# Patient Record
Sex: Female | Born: 1965 | Race: White | Hispanic: No | Marital: Married | Smoking: Never smoker
Health system: Southern US, Community
[De-identification: ages and names within clinical notes are randomized; demographics above are authoritative.]

## PROBLEM LIST (undated history)

## (undated) DIAGNOSIS — J329 Chronic sinusitis, unspecified: Secondary | ICD-10-CM

## (undated) DIAGNOSIS — R011 Cardiac murmur, unspecified: Secondary | ICD-10-CM

## (undated) DIAGNOSIS — I890 Lymphedema, not elsewhere classified: Secondary | ICD-10-CM

## (undated) DIAGNOSIS — I00 Rheumatic fever without heart involvement: Secondary | ICD-10-CM

## (undated) DIAGNOSIS — J309 Allergic rhinitis, unspecified: Secondary | ICD-10-CM

## (undated) DIAGNOSIS — G43919 Migraine, unspecified, intractable, without status migrainosus: Secondary | ICD-10-CM

## (undated) DIAGNOSIS — E7800 Pure hypercholesterolemia, unspecified: Secondary | ICD-10-CM

## (undated) DIAGNOSIS — M1710 Unilateral primary osteoarthritis, unspecified knee: Secondary | ICD-10-CM

## (undated) DIAGNOSIS — E669 Obesity, unspecified: Secondary | ICD-10-CM

## (undated) DIAGNOSIS — S83206A Unspecified tear of unspecified meniscus, current injury, right knee, initial encounter: Secondary | ICD-10-CM

## (undated) DIAGNOSIS — J45909 Unspecified asthma, uncomplicated: Principal | ICD-10-CM

## (undated) DIAGNOSIS — K219 Gastro-esophageal reflux disease without esophagitis: Secondary | ICD-10-CM

## (undated) DIAGNOSIS — G5600 Carpal tunnel syndrome, unspecified upper limb: Secondary | ICD-10-CM

## (undated) DIAGNOSIS — F329 Major depressive disorder, single episode, unspecified: Secondary | ICD-10-CM

## (undated) DIAGNOSIS — G971 Other reaction to spinal and lumbar puncture: Secondary | ICD-10-CM

## (undated) DIAGNOSIS — I10 Essential (primary) hypertension: Secondary | ICD-10-CM

## (undated) HISTORY — DX: Chronic sinusitis, unspecified: J32.9

## (undated) HISTORY — DX: Carpal tunnel syndrome, unspecified upper limb: G56.00

## (undated) HISTORY — PX: CHOLECYSTECTOMY: SHX55

## (undated) HISTORY — DX: Major depressive disorder, single episode, unspecified: F32.9

## (undated) HISTORY — PX: CARDIAC CATHETERIZATION: SHX172

## (undated) HISTORY — DX: Essential (primary) hypertension: I10

## (undated) HISTORY — DX: Pure hypercholesterolemia, unspecified: E78.00

## (undated) HISTORY — DX: Unspecified asthma, uncomplicated: J45.909

## (undated) HISTORY — DX: Obesity, unspecified: E66.9

## (undated) HISTORY — DX: Gastro-esophageal reflux disease without esophagitis: K21.9

## (undated) HISTORY — PX: WISDOM TOOTH EXTRACTION: SHX21

## (undated) HISTORY — PX: CERVICAL FUSION: SHX112

## (undated) HISTORY — DX: Unilateral primary osteoarthritis, unspecified knee: M17.10

## (undated) HISTORY — DX: Morbid (severe) obesity due to excess calories: E66.01

## (undated) HISTORY — DX: Lymphedema, not elsewhere classified: I89.0

## (undated) HISTORY — PX: APPENDECTOMY: SHX54

## (undated) HISTORY — DX: Rheumatic fever without heart involvement: I00

## (undated) HISTORY — DX: Migraine, unspecified, intractable, without status migrainosus: G43.919

---

## 1997-06-15 ENCOUNTER — Encounter: Admission: RE | Admit: 1997-06-15 | Discharge: 1997-06-15 | Payer: Self-pay | Admitting: Sports Medicine

## 1997-08-19 ENCOUNTER — Encounter: Admission: RE | Admit: 1997-08-19 | Discharge: 1997-08-19 | Payer: Self-pay | Admitting: Family Medicine

## 1997-09-05 ENCOUNTER — Encounter: Admission: RE | Admit: 1997-09-05 | Discharge: 1997-09-05 | Payer: Self-pay | Admitting: Family Medicine

## 1997-09-06 ENCOUNTER — Encounter: Admission: RE | Admit: 1997-09-06 | Discharge: 1997-09-06 | Payer: Self-pay | Admitting: Family Medicine

## 1997-09-08 ENCOUNTER — Ambulatory Visit (HOSPITAL_COMMUNITY): Admission: RE | Admit: 1997-09-08 | Discharge: 1997-09-08 | Payer: Self-pay | Admitting: Cardiology

## 1997-09-13 ENCOUNTER — Encounter: Admission: RE | Admit: 1997-09-13 | Discharge: 1997-09-13 | Payer: Self-pay | Admitting: Family Medicine

## 1997-10-05 ENCOUNTER — Encounter: Admission: RE | Admit: 1997-10-05 | Discharge: 1997-10-05 | Payer: Self-pay | Admitting: Family Medicine

## 1997-10-25 ENCOUNTER — Encounter: Admission: RE | Admit: 1997-10-25 | Discharge: 1997-10-25 | Payer: Self-pay | Admitting: Sports Medicine

## 1997-11-09 ENCOUNTER — Encounter: Admission: RE | Admit: 1997-11-09 | Discharge: 1997-11-09 | Payer: Self-pay | Admitting: Family Medicine

## 1997-12-13 ENCOUNTER — Emergency Department (HOSPITAL_COMMUNITY): Admission: EM | Admit: 1997-12-13 | Discharge: 1997-12-13 | Payer: Self-pay | Admitting: Emergency Medicine

## 1997-12-16 ENCOUNTER — Encounter: Admission: RE | Admit: 1997-12-16 | Discharge: 1997-12-16 | Payer: Self-pay | Admitting: Family Medicine

## 1998-01-18 ENCOUNTER — Encounter: Admission: RE | Admit: 1998-01-18 | Discharge: 1998-01-18 | Payer: Self-pay | Admitting: Family Medicine

## 1998-02-22 ENCOUNTER — Encounter: Admission: RE | Admit: 1998-02-22 | Discharge: 1998-02-22 | Payer: Self-pay | Admitting: Family Medicine

## 1998-03-16 ENCOUNTER — Ambulatory Visit (HOSPITAL_BASED_OUTPATIENT_CLINIC_OR_DEPARTMENT_OTHER): Admission: RE | Admit: 1998-03-16 | Discharge: 1998-03-16 | Payer: Self-pay | Admitting: Orthopaedic Surgery

## 1998-05-10 ENCOUNTER — Encounter: Admission: RE | Admit: 1998-05-10 | Discharge: 1998-05-10 | Payer: Self-pay | Admitting: Family Medicine

## 1998-07-05 ENCOUNTER — Encounter: Admission: RE | Admit: 1998-07-05 | Discharge: 1998-07-05 | Payer: Self-pay | Admitting: Family Medicine

## 1998-08-17 ENCOUNTER — Encounter: Admission: RE | Admit: 1998-08-17 | Discharge: 1998-08-17 | Payer: Self-pay | Admitting: Family Medicine

## 1999-01-26 ENCOUNTER — Encounter: Admission: RE | Admit: 1999-01-26 | Discharge: 1999-01-26 | Payer: Self-pay | Admitting: Family Medicine

## 1999-03-16 ENCOUNTER — Encounter: Admission: RE | Admit: 1999-03-16 | Discharge: 1999-03-16 | Payer: Self-pay | Admitting: Family Medicine

## 1999-04-09 ENCOUNTER — Encounter: Admission: RE | Admit: 1999-04-09 | Discharge: 1999-04-09 | Payer: Self-pay | Admitting: Family Medicine

## 1999-10-22 ENCOUNTER — Encounter: Admission: RE | Admit: 1999-10-22 | Discharge: 1999-10-22 | Payer: Self-pay | Admitting: Family Medicine

## 1999-12-07 ENCOUNTER — Encounter: Admission: RE | Admit: 1999-12-07 | Discharge: 1999-12-07 | Payer: Self-pay | Admitting: Family Medicine

## 2000-01-25 ENCOUNTER — Emergency Department (HOSPITAL_COMMUNITY): Admission: EM | Admit: 2000-01-25 | Discharge: 2000-01-26 | Payer: Self-pay | Admitting: Emergency Medicine

## 2000-01-26 ENCOUNTER — Encounter: Payer: Self-pay | Admitting: *Deleted

## 2000-02-05 ENCOUNTER — Encounter: Admission: RE | Admit: 2000-02-05 | Discharge: 2000-02-05 | Payer: Self-pay | Admitting: Family Medicine

## 2000-03-17 ENCOUNTER — Encounter: Admission: RE | Admit: 2000-03-17 | Discharge: 2000-03-17 | Payer: Self-pay | Admitting: Family Medicine

## 2000-04-07 ENCOUNTER — Emergency Department (HOSPITAL_COMMUNITY): Admission: EM | Admit: 2000-04-07 | Discharge: 2000-04-07 | Payer: Self-pay | Admitting: Emergency Medicine

## 2000-04-07 ENCOUNTER — Encounter: Payer: Self-pay | Admitting: Emergency Medicine

## 2000-04-14 ENCOUNTER — Encounter: Admission: RE | Admit: 2000-04-14 | Discharge: 2000-04-14 | Payer: Self-pay | Admitting: Family Medicine

## 2000-04-23 ENCOUNTER — Ambulatory Visit (HOSPITAL_COMMUNITY): Admission: RE | Admit: 2000-04-23 | Discharge: 2000-04-23 | Payer: Self-pay | Admitting: Family Medicine

## 2000-04-23 ENCOUNTER — Encounter: Admission: RE | Admit: 2000-04-23 | Discharge: 2000-04-23 | Payer: Self-pay | Admitting: Family Medicine

## 2000-04-24 ENCOUNTER — Ambulatory Visit (HOSPITAL_COMMUNITY): Admission: RE | Admit: 2000-04-24 | Discharge: 2000-04-24 | Payer: Self-pay | Admitting: *Deleted

## 2000-04-25 ENCOUNTER — Encounter: Payer: Self-pay | Admitting: *Deleted

## 2000-04-25 ENCOUNTER — Encounter: Admission: RE | Admit: 2000-04-25 | Discharge: 2000-04-25 | Payer: Self-pay | Admitting: *Deleted

## 2000-05-19 ENCOUNTER — Encounter: Admission: RE | Admit: 2000-05-19 | Discharge: 2000-05-19 | Payer: Self-pay | Admitting: Family Medicine

## 2000-06-02 ENCOUNTER — Encounter: Admission: RE | Admit: 2000-06-02 | Discharge: 2000-06-02 | Payer: Self-pay | Admitting: Family Medicine

## 2000-06-23 ENCOUNTER — Encounter: Admission: RE | Admit: 2000-06-23 | Discharge: 2000-06-23 | Payer: Self-pay | Admitting: Family Medicine

## 2000-07-15 ENCOUNTER — Encounter: Admission: RE | Admit: 2000-07-15 | Discharge: 2000-07-15 | Payer: Self-pay | Admitting: Family Medicine

## 2000-08-06 ENCOUNTER — Encounter: Admission: RE | Admit: 2000-08-06 | Discharge: 2000-08-06 | Payer: Self-pay | Admitting: Family Medicine

## 2000-10-15 ENCOUNTER — Encounter: Admission: RE | Admit: 2000-10-15 | Discharge: 2000-10-15 | Payer: Self-pay | Admitting: Family Medicine

## 2000-11-13 ENCOUNTER — Encounter: Admission: RE | Admit: 2000-11-13 | Discharge: 2000-11-13 | Payer: Self-pay | Admitting: Family Medicine

## 2000-12-21 ENCOUNTER — Emergency Department (HOSPITAL_COMMUNITY): Admission: EM | Admit: 2000-12-21 | Discharge: 2000-12-21 | Payer: Self-pay | Admitting: Emergency Medicine

## 2000-12-24 ENCOUNTER — Encounter: Admission: RE | Admit: 2000-12-24 | Discharge: 2000-12-24 | Payer: Self-pay | Admitting: Family Medicine

## 2001-02-25 ENCOUNTER — Encounter: Admission: RE | Admit: 2001-02-25 | Discharge: 2001-02-25 | Payer: Self-pay | Admitting: Family Medicine

## 2001-03-16 ENCOUNTER — Encounter: Admission: RE | Admit: 2001-03-16 | Discharge: 2001-03-16 | Payer: Self-pay | Admitting: Family Medicine

## 2001-05-14 ENCOUNTER — Encounter: Admission: RE | Admit: 2001-05-14 | Discharge: 2001-05-14 | Payer: Self-pay | Admitting: Sports Medicine

## 2001-05-21 ENCOUNTER — Encounter: Admission: RE | Admit: 2001-05-21 | Discharge: 2001-05-21 | Payer: Self-pay | Admitting: Family Medicine

## 2001-07-20 ENCOUNTER — Encounter: Admission: RE | Admit: 2001-07-20 | Discharge: 2001-07-20 | Payer: Self-pay | Admitting: Family Medicine

## 2001-10-02 ENCOUNTER — Encounter: Admission: RE | Admit: 2001-10-02 | Discharge: 2001-10-02 | Payer: Self-pay | Admitting: Family Medicine

## 2001-10-06 ENCOUNTER — Encounter: Admission: RE | Admit: 2001-10-06 | Discharge: 2001-10-06 | Payer: Self-pay | Admitting: Family Medicine

## 2001-10-08 ENCOUNTER — Encounter (INDEPENDENT_AMBULATORY_CARE_PROVIDER_SITE_OTHER): Payer: Self-pay | Admitting: *Deleted

## 2001-10-08 ENCOUNTER — Ambulatory Visit (HOSPITAL_BASED_OUTPATIENT_CLINIC_OR_DEPARTMENT_OTHER): Admission: RE | Admit: 2001-10-08 | Discharge: 2001-10-08 | Payer: Self-pay | Admitting: General Surgery

## 2001-10-08 ENCOUNTER — Encounter: Payer: Self-pay | Admitting: General Surgery

## 2001-10-08 ENCOUNTER — Encounter: Admission: RE | Admit: 2001-10-08 | Discharge: 2001-10-08 | Payer: Self-pay | Admitting: General Surgery

## 2001-10-09 ENCOUNTER — Observation Stay (HOSPITAL_COMMUNITY): Admission: EM | Admit: 2001-10-09 | Discharge: 2001-10-09 | Payer: Self-pay | Admitting: General Surgery

## 2001-12-23 ENCOUNTER — Encounter: Admission: RE | Admit: 2001-12-23 | Discharge: 2001-12-23 | Payer: Self-pay | Admitting: Family Medicine

## 2002-01-01 ENCOUNTER — Encounter: Admission: RE | Admit: 2002-01-01 | Discharge: 2002-01-01 | Payer: Self-pay | Admitting: Family Medicine

## 2002-01-22 ENCOUNTER — Encounter: Admission: RE | Admit: 2002-01-22 | Discharge: 2002-01-22 | Payer: Self-pay | Admitting: Family Medicine

## 2002-06-01 ENCOUNTER — Encounter: Admission: RE | Admit: 2002-06-01 | Discharge: 2002-06-01 | Payer: Self-pay | Admitting: Family Medicine

## 2002-08-10 ENCOUNTER — Encounter: Admission: RE | Admit: 2002-08-10 | Discharge: 2002-08-10 | Payer: Self-pay | Admitting: Family Medicine

## 2002-09-02 ENCOUNTER — Encounter: Admission: RE | Admit: 2002-09-02 | Discharge: 2002-09-02 | Payer: Self-pay | Admitting: Family Medicine

## 2002-10-06 ENCOUNTER — Encounter: Admission: RE | Admit: 2002-10-06 | Discharge: 2002-10-06 | Payer: Self-pay | Admitting: Family Medicine

## 2003-03-16 ENCOUNTER — Encounter: Admission: RE | Admit: 2003-03-16 | Discharge: 2003-03-16 | Payer: Self-pay | Admitting: Family Medicine

## 2003-03-24 ENCOUNTER — Encounter: Admission: RE | Admit: 2003-03-24 | Discharge: 2003-03-24 | Payer: Self-pay | Admitting: Family Medicine

## 2003-08-31 ENCOUNTER — Encounter: Admission: RE | Admit: 2003-08-31 | Discharge: 2003-08-31 | Payer: Self-pay | Admitting: Family Medicine

## 2003-10-11 ENCOUNTER — Emergency Department (HOSPITAL_COMMUNITY): Admission: EM | Admit: 2003-10-11 | Discharge: 2003-10-11 | Payer: Self-pay | Admitting: Emergency Medicine

## 2003-12-05 ENCOUNTER — Emergency Department (HOSPITAL_COMMUNITY): Admission: EM | Admit: 2003-12-05 | Discharge: 2003-12-05 | Payer: Self-pay | Admitting: Emergency Medicine

## 2004-03-30 ENCOUNTER — Ambulatory Visit: Payer: Self-pay | Admitting: Sports Medicine

## 2004-06-07 ENCOUNTER — Ambulatory Visit: Payer: Self-pay | Admitting: Family Medicine

## 2005-04-17 ENCOUNTER — Ambulatory Visit: Payer: Self-pay | Admitting: Family Medicine

## 2005-05-07 ENCOUNTER — Ambulatory Visit: Payer: Self-pay | Admitting: Sports Medicine

## 2005-05-07 ENCOUNTER — Encounter: Admission: RE | Admit: 2005-05-07 | Discharge: 2005-05-07 | Payer: Self-pay | Admitting: Family Medicine

## 2005-05-10 ENCOUNTER — Encounter: Admission: RE | Admit: 2005-05-10 | Discharge: 2005-05-10 | Payer: Self-pay | Admitting: Family Medicine

## 2005-05-27 ENCOUNTER — Ambulatory Visit: Payer: Self-pay | Admitting: Family Medicine

## 2005-06-15 ENCOUNTER — Encounter (INDEPENDENT_AMBULATORY_CARE_PROVIDER_SITE_OTHER): Payer: Self-pay | Admitting: *Deleted

## 2005-08-27 ENCOUNTER — Ambulatory Visit (HOSPITAL_BASED_OUTPATIENT_CLINIC_OR_DEPARTMENT_OTHER): Admission: RE | Admit: 2005-08-27 | Discharge: 2005-08-27 | Payer: Self-pay | Admitting: Orthopaedic Surgery

## 2006-01-10 ENCOUNTER — Ambulatory Visit: Payer: Self-pay | Admitting: Family Medicine

## 2006-01-22 ENCOUNTER — Ambulatory Visit: Payer: Self-pay | Admitting: Family Medicine

## 2006-02-21 ENCOUNTER — Ambulatory Visit: Payer: Self-pay | Admitting: Sports Medicine

## 2006-03-25 ENCOUNTER — Ambulatory Visit: Payer: Self-pay | Admitting: Family Medicine

## 2006-05-01 ENCOUNTER — Ambulatory Visit: Payer: Self-pay | Admitting: Family Medicine

## 2006-05-08 DIAGNOSIS — K219 Gastro-esophageal reflux disease without esophagitis: Secondary | ICD-10-CM

## 2006-05-08 DIAGNOSIS — J45909 Unspecified asthma, uncomplicated: Secondary | ICD-10-CM

## 2006-05-08 DIAGNOSIS — J329 Chronic sinusitis, unspecified: Secondary | ICD-10-CM

## 2006-05-08 DIAGNOSIS — J309 Allergic rhinitis, unspecified: Secondary | ICD-10-CM | POA: Insufficient documentation

## 2006-05-08 DIAGNOSIS — E669 Obesity, unspecified: Secondary | ICD-10-CM

## 2006-05-08 DIAGNOSIS — E7800 Pure hypercholesterolemia, unspecified: Secondary | ICD-10-CM

## 2006-05-08 DIAGNOSIS — G5600 Carpal tunnel syndrome, unspecified upper limb: Secondary | ICD-10-CM

## 2006-05-08 HISTORY — DX: Chronic sinusitis, unspecified: J32.9

## 2006-05-08 HISTORY — DX: Pure hypercholesterolemia, unspecified: E78.00

## 2006-05-08 HISTORY — DX: Carpal tunnel syndrome, unspecified upper limb: G56.00

## 2006-05-08 HISTORY — DX: Gastro-esophageal reflux disease without esophagitis: K21.9

## 2006-05-09 ENCOUNTER — Encounter (INDEPENDENT_AMBULATORY_CARE_PROVIDER_SITE_OTHER): Payer: Self-pay | Admitting: *Deleted

## 2006-05-21 ENCOUNTER — Telehealth: Payer: Self-pay | Admitting: *Deleted

## 2006-05-22 ENCOUNTER — Ambulatory Visit: Payer: Self-pay | Admitting: Sports Medicine

## 2006-05-22 DIAGNOSIS — I10 Essential (primary) hypertension: Secondary | ICD-10-CM

## 2006-05-22 HISTORY — DX: Essential (primary) hypertension: I10

## 2006-05-27 ENCOUNTER — Telehealth (INDEPENDENT_AMBULATORY_CARE_PROVIDER_SITE_OTHER): Payer: Self-pay | Admitting: *Deleted

## 2006-05-30 ENCOUNTER — Telehealth: Payer: Self-pay | Admitting: *Deleted

## 2006-06-27 ENCOUNTER — Telehealth (INDEPENDENT_AMBULATORY_CARE_PROVIDER_SITE_OTHER): Payer: Self-pay | Admitting: *Deleted

## 2006-07-11 ENCOUNTER — Ambulatory Visit: Payer: Self-pay | Admitting: Family Medicine

## 2006-09-23 ENCOUNTER — Telehealth: Payer: Self-pay | Admitting: *Deleted

## 2006-09-29 ENCOUNTER — Ambulatory Visit: Payer: Self-pay | Admitting: Family Medicine

## 2006-10-24 ENCOUNTER — Encounter: Admission: RE | Admit: 2006-10-24 | Discharge: 2006-10-24 | Payer: Self-pay | Admitting: Sports Medicine

## 2006-10-24 ENCOUNTER — Encounter (INDEPENDENT_AMBULATORY_CARE_PROVIDER_SITE_OTHER): Payer: Self-pay | Admitting: *Deleted

## 2006-10-24 ENCOUNTER — Ambulatory Visit: Payer: Self-pay | Admitting: Family Medicine

## 2006-10-24 LAB — CONVERTED CEMR LAB: Uric Acid, Serum: 5.5 mg/dL (ref 2.4–7.0)

## 2006-10-31 ENCOUNTER — Ambulatory Visit: Payer: Self-pay | Admitting: Family Medicine

## 2006-11-06 ENCOUNTER — Telehealth: Payer: Self-pay | Admitting: *Deleted

## 2006-11-12 ENCOUNTER — Ambulatory Visit: Payer: Self-pay | Admitting: Family Medicine

## 2006-11-12 ENCOUNTER — Telehealth (INDEPENDENT_AMBULATORY_CARE_PROVIDER_SITE_OTHER): Payer: Self-pay | Admitting: Family Medicine

## 2006-11-17 ENCOUNTER — Encounter (INDEPENDENT_AMBULATORY_CARE_PROVIDER_SITE_OTHER): Payer: Self-pay | Admitting: *Deleted

## 2006-11-17 ENCOUNTER — Ambulatory Visit: Payer: Self-pay | Admitting: Sports Medicine

## 2006-11-18 ENCOUNTER — Encounter (INDEPENDENT_AMBULATORY_CARE_PROVIDER_SITE_OTHER): Payer: Self-pay | Admitting: *Deleted

## 2006-11-18 LAB — CONVERTED CEMR LAB
Albumin: 3.9 g/dL (ref 3.5–5.2)
BUN: 12 mg/dL (ref 6–23)
CO2: 22 meq/L (ref 19–32)
Calcium: 9.2 mg/dL (ref 8.4–10.5)
Glucose, Bld: 96 mg/dL (ref 70–99)
Potassium: 4.2 meq/L (ref 3.5–5.3)
Sodium: 138 meq/L (ref 135–145)
Total Protein: 7.2 g/dL (ref 6.0–8.3)

## 2006-11-19 ENCOUNTER — Ambulatory Visit: Payer: Self-pay | Admitting: Family Medicine

## 2006-11-19 ENCOUNTER — Telehealth: Payer: Self-pay | Admitting: *Deleted

## 2006-11-19 DIAGNOSIS — G43909 Migraine, unspecified, not intractable, without status migrainosus: Secondary | ICD-10-CM

## 2006-11-24 ENCOUNTER — Ambulatory Visit: Payer: Self-pay | Admitting: Family Medicine

## 2006-11-25 ENCOUNTER — Emergency Department (HOSPITAL_COMMUNITY): Admission: EM | Admit: 2006-11-25 | Discharge: 2006-11-25 | Payer: Self-pay | Admitting: Emergency Medicine

## 2006-11-25 ENCOUNTER — Ambulatory Visit: Payer: Self-pay | Admitting: Family Medicine

## 2006-11-25 ENCOUNTER — Ambulatory Visit (HOSPITAL_COMMUNITY): Admission: RE | Admit: 2006-11-25 | Discharge: 2006-11-25 | Payer: Self-pay | Admitting: Family Medicine

## 2006-11-27 ENCOUNTER — Encounter (INDEPENDENT_AMBULATORY_CARE_PROVIDER_SITE_OTHER): Payer: Self-pay | Admitting: *Deleted

## 2006-12-08 ENCOUNTER — Encounter: Payer: Self-pay | Admitting: Family Medicine

## 2006-12-08 ENCOUNTER — Ambulatory Visit: Payer: Self-pay | Admitting: Cardiology

## 2006-12-08 ENCOUNTER — Ambulatory Visit: Payer: Self-pay | Admitting: Sports Medicine

## 2006-12-15 ENCOUNTER — Encounter (INDEPENDENT_AMBULATORY_CARE_PROVIDER_SITE_OTHER): Payer: Self-pay | Admitting: *Deleted

## 2006-12-15 ENCOUNTER — Ambulatory Visit: Payer: Self-pay

## 2006-12-15 LAB — CONVERTED CEMR LAB
BUN: 7 mg/dL
CO2: 29 meq/L
Calcium: 8.5 mg/dL
Chloride: 106 meq/L
Creatinine, Ser: 0.7 mg/dL
GFR calc Af Amer: 118 mL/min
GFR calc non Af Amer: 98 mL/min
Glucose, Bld: 98 mg/dL
Potassium: 3.9 meq/L
Sodium: 141 meq/L

## 2006-12-16 ENCOUNTER — Ambulatory Visit: Payer: Self-pay | Admitting: Family Medicine

## 2006-12-16 ENCOUNTER — Ambulatory Visit: Payer: Self-pay

## 2006-12-16 DIAGNOSIS — F32A Depression, unspecified: Secondary | ICD-10-CM

## 2006-12-16 DIAGNOSIS — F329 Major depressive disorder, single episode, unspecified: Secondary | ICD-10-CM

## 2006-12-16 HISTORY — DX: Depression, unspecified: F32.A

## 2006-12-31 ENCOUNTER — Ambulatory Visit: Payer: Self-pay | Admitting: Cardiology

## 2007-01-06 ENCOUNTER — Ambulatory Visit: Payer: Self-pay | Admitting: Family Medicine

## 2007-01-21 ENCOUNTER — Ambulatory Visit: Payer: Self-pay | Admitting: Family Medicine

## 2007-02-04 ENCOUNTER — Encounter (INDEPENDENT_AMBULATORY_CARE_PROVIDER_SITE_OTHER): Payer: Self-pay | Admitting: *Deleted

## 2007-02-12 ENCOUNTER — Ambulatory Visit: Payer: Self-pay | Admitting: Family Medicine

## 2007-02-12 LAB — CONVERTED CEMR LAB: Rapid Strep: NEGATIVE

## 2007-02-18 ENCOUNTER — Ambulatory Visit: Payer: Self-pay | Admitting: Family Medicine

## 2007-02-24 ENCOUNTER — Telehealth (INDEPENDENT_AMBULATORY_CARE_PROVIDER_SITE_OTHER): Payer: Self-pay | Admitting: *Deleted

## 2007-02-26 ENCOUNTER — Encounter: Payer: Self-pay | Admitting: *Deleted

## 2007-03-23 ENCOUNTER — Ambulatory Visit: Payer: Self-pay | Admitting: Family Medicine

## 2007-03-29 ENCOUNTER — Encounter (INDEPENDENT_AMBULATORY_CARE_PROVIDER_SITE_OTHER): Payer: Self-pay | Admitting: General Surgery

## 2007-03-29 ENCOUNTER — Observation Stay (HOSPITAL_COMMUNITY): Admission: EM | Admit: 2007-03-29 | Discharge: 2007-03-30 | Payer: Self-pay | Admitting: Emergency Medicine

## 2007-04-20 ENCOUNTER — Ambulatory Visit: Payer: Self-pay | Admitting: Family Medicine

## 2007-04-27 ENCOUNTER — Encounter (INDEPENDENT_AMBULATORY_CARE_PROVIDER_SITE_OTHER): Payer: Self-pay | Admitting: *Deleted

## 2007-05-01 ENCOUNTER — Telehealth: Payer: Self-pay | Admitting: *Deleted

## 2007-05-20 ENCOUNTER — Encounter (INDEPENDENT_AMBULATORY_CARE_PROVIDER_SITE_OTHER): Payer: Self-pay | Admitting: Family Medicine

## 2007-05-20 ENCOUNTER — Telehealth (INDEPENDENT_AMBULATORY_CARE_PROVIDER_SITE_OTHER): Payer: Self-pay | Admitting: *Deleted

## 2007-05-20 ENCOUNTER — Encounter (INDEPENDENT_AMBULATORY_CARE_PROVIDER_SITE_OTHER): Payer: Self-pay | Admitting: *Deleted

## 2007-05-22 ENCOUNTER — Ambulatory Visit: Payer: Self-pay | Admitting: Family Medicine

## 2007-05-22 ENCOUNTER — Encounter: Payer: Self-pay | Admitting: *Deleted

## 2007-05-28 ENCOUNTER — Telehealth (INDEPENDENT_AMBULATORY_CARE_PROVIDER_SITE_OTHER): Payer: Self-pay | Admitting: *Deleted

## 2007-06-15 ENCOUNTER — Encounter (INDEPENDENT_AMBULATORY_CARE_PROVIDER_SITE_OTHER): Payer: Self-pay | Admitting: *Deleted

## 2007-06-24 ENCOUNTER — Ambulatory Visit: Payer: Self-pay | Admitting: Family Medicine

## 2007-07-08 ENCOUNTER — Ambulatory Visit: Payer: Self-pay | Admitting: Cardiology

## 2007-07-09 ENCOUNTER — Telehealth (INDEPENDENT_AMBULATORY_CARE_PROVIDER_SITE_OTHER): Payer: Self-pay | Admitting: *Deleted

## 2007-07-09 LAB — CONVERTED CEMR LAB
GFR calc Af Amer: 117 mL/min
Glucose, Bld: 105 mg/dL — ABNORMAL HIGH (ref 70–99)
HCT: 39.4 % (ref 36.0–46.0)
INR: 1.1 — ABNORMAL HIGH (ref 0.8–1.0)
Lymphocytes Relative: 20.2 % (ref 12.0–46.0)
Monocytes Absolute: 0.3 10*3/uL (ref 0.1–1.0)
Monocytes Relative: 3.4 % (ref 3.0–12.0)
Neutrophils Relative %: 74.8 % (ref 43.0–77.0)
Platelets: 332 10*3/uL (ref 150–400)
Potassium: 4.2 meq/L (ref 3.5–5.1)
Prothrombin Time: 13.4 s — ABNORMAL HIGH (ref 10.9–13.3)
RDW: 13.4 % (ref 11.5–14.6)
Sodium: 138 meq/L (ref 135–145)

## 2007-07-10 ENCOUNTER — Inpatient Hospital Stay (HOSPITAL_BASED_OUTPATIENT_CLINIC_OR_DEPARTMENT_OTHER): Admission: RE | Admit: 2007-07-10 | Discharge: 2007-07-10 | Payer: Self-pay | Admitting: Cardiovascular Disease

## 2007-07-10 ENCOUNTER — Observation Stay (HOSPITAL_COMMUNITY): Admission: AD | Admit: 2007-07-10 | Discharge: 2007-07-11 | Payer: Self-pay | Admitting: Cardiovascular Disease

## 2007-07-10 ENCOUNTER — Ambulatory Visit: Payer: Self-pay | Admitting: Cardiovascular Disease

## 2007-07-23 ENCOUNTER — Ambulatory Visit: Payer: Self-pay | Admitting: Family Medicine

## 2007-07-27 ENCOUNTER — Encounter (INDEPENDENT_AMBULATORY_CARE_PROVIDER_SITE_OTHER): Payer: Self-pay | Admitting: *Deleted

## 2007-07-27 ENCOUNTER — Ambulatory Visit: Payer: Self-pay | Admitting: Family Medicine

## 2007-07-27 LAB — CONVERTED CEMR LAB: LDL Cholesterol: 137 mg/dL

## 2007-07-29 LAB — CONVERTED CEMR LAB
AST: 10 units/L (ref 0–37)
Albumin: 3.6 g/dL (ref 3.5–5.2)
Alkaline Phosphatase: 55 units/L (ref 39–117)
Glucose, Bld: 101 mg/dL — ABNORMAL HIGH (ref 70–99)
LDL Cholesterol: 137 mg/dL — ABNORMAL HIGH (ref 0–99)
Potassium: 4.3 meq/L (ref 3.5–5.3)
Sodium: 139 meq/L (ref 135–145)
Total Protein: 6.7 g/dL (ref 6.0–8.3)
Triglycerides: 118 mg/dL (ref <150)
Uric Acid, Serum: 5.6 mg/dL (ref 2.4–7.0)

## 2007-07-30 ENCOUNTER — Ambulatory Visit: Payer: Self-pay | Admitting: Cardiology

## 2007-07-31 ENCOUNTER — Encounter (INDEPENDENT_AMBULATORY_CARE_PROVIDER_SITE_OTHER): Payer: Self-pay | Admitting: *Deleted

## 2007-07-31 ENCOUNTER — Ambulatory Visit: Payer: Self-pay | Admitting: Family Medicine

## 2007-07-31 DIAGNOSIS — L301 Dyshidrosis [pompholyx]: Secondary | ICD-10-CM

## 2007-08-06 ENCOUNTER — Encounter: Admission: RE | Admit: 2007-08-06 | Discharge: 2007-08-06 | Payer: Self-pay | Admitting: Family Medicine

## 2007-08-06 ENCOUNTER — Telehealth: Payer: Self-pay | Admitting: *Deleted

## 2007-08-06 ENCOUNTER — Ambulatory Visit: Payer: Self-pay | Admitting: Family Medicine

## 2007-08-06 DIAGNOSIS — M5412 Radiculopathy, cervical region: Secondary | ICD-10-CM

## 2007-08-10 ENCOUNTER — Ambulatory Visit: Payer: Self-pay | Admitting: Family Medicine

## 2007-08-10 ENCOUNTER — Encounter (INDEPENDENT_AMBULATORY_CARE_PROVIDER_SITE_OTHER): Payer: Self-pay | Admitting: *Deleted

## 2007-08-12 ENCOUNTER — Encounter (INDEPENDENT_AMBULATORY_CARE_PROVIDER_SITE_OTHER): Payer: Self-pay | Admitting: *Deleted

## 2007-08-12 ENCOUNTER — Encounter: Admission: RE | Admit: 2007-08-12 | Discharge: 2007-08-12 | Payer: Self-pay | Admitting: Family Medicine

## 2007-08-17 ENCOUNTER — Ambulatory Visit: Payer: Self-pay | Admitting: Sports Medicine

## 2007-08-18 ENCOUNTER — Telehealth (INDEPENDENT_AMBULATORY_CARE_PROVIDER_SITE_OTHER): Payer: Self-pay | Admitting: *Deleted

## 2007-08-24 ENCOUNTER — Telehealth (INDEPENDENT_AMBULATORY_CARE_PROVIDER_SITE_OTHER): Payer: Self-pay | Admitting: *Deleted

## 2007-09-01 ENCOUNTER — Encounter (INDEPENDENT_AMBULATORY_CARE_PROVIDER_SITE_OTHER): Payer: Self-pay | Admitting: *Deleted

## 2007-09-01 ENCOUNTER — Encounter: Admission: RE | Admit: 2007-09-01 | Discharge: 2007-09-01 | Payer: Self-pay | Admitting: Sports Medicine

## 2007-09-01 ENCOUNTER — Ambulatory Visit: Payer: Self-pay | Admitting: Family Medicine

## 2007-09-08 ENCOUNTER — Telehealth: Payer: Self-pay | Admitting: *Deleted

## 2007-09-29 ENCOUNTER — Ambulatory Visit: Payer: Self-pay | Admitting: Family Medicine

## 2007-09-29 ENCOUNTER — Encounter (INDEPENDENT_AMBULATORY_CARE_PROVIDER_SITE_OTHER): Payer: Self-pay | Admitting: Family Medicine

## 2007-09-29 LAB — CONVERTED CEMR LAB: Whiff Test: NEGATIVE

## 2007-10-06 LAB — CONVERTED CEMR LAB: Pap Smear: NORMAL

## 2007-10-09 ENCOUNTER — Telehealth: Payer: Self-pay | Admitting: *Deleted

## 2007-10-12 ENCOUNTER — Other Ambulatory Visit: Payer: Self-pay | Admitting: Neurosurgery

## 2007-10-12 ENCOUNTER — Encounter (INDEPENDENT_AMBULATORY_CARE_PROVIDER_SITE_OTHER): Payer: Self-pay | Admitting: Family Medicine

## 2007-10-14 ENCOUNTER — Encounter: Payer: Self-pay | Admitting: Neurosurgery

## 2007-10-14 ENCOUNTER — Ambulatory Visit (HOSPITAL_COMMUNITY): Admission: RE | Admit: 2007-10-14 | Discharge: 2007-10-16 | Payer: Self-pay | Admitting: Neurosurgery

## 2007-10-29 ENCOUNTER — Telehealth (INDEPENDENT_AMBULATORY_CARE_PROVIDER_SITE_OTHER): Payer: Self-pay | Admitting: Family Medicine

## 2007-11-19 ENCOUNTER — Telehealth (INDEPENDENT_AMBULATORY_CARE_PROVIDER_SITE_OTHER): Payer: Self-pay | Admitting: *Deleted

## 2007-12-01 ENCOUNTER — Ambulatory Visit: Payer: Self-pay | Admitting: Family Medicine

## 2007-12-11 ENCOUNTER — Telehealth (INDEPENDENT_AMBULATORY_CARE_PROVIDER_SITE_OTHER): Payer: Self-pay | Admitting: Family Medicine

## 2008-01-14 ENCOUNTER — Telehealth (INDEPENDENT_AMBULATORY_CARE_PROVIDER_SITE_OTHER): Payer: Self-pay | Admitting: *Deleted

## 2008-01-26 ENCOUNTER — Encounter (INDEPENDENT_AMBULATORY_CARE_PROVIDER_SITE_OTHER): Payer: Self-pay | Admitting: Family Medicine

## 2008-03-15 ENCOUNTER — Telehealth: Payer: Self-pay | Admitting: *Deleted

## 2008-03-16 ENCOUNTER — Encounter (INDEPENDENT_AMBULATORY_CARE_PROVIDER_SITE_OTHER): Payer: Self-pay | Admitting: *Deleted

## 2008-03-16 ENCOUNTER — Ambulatory Visit: Payer: Self-pay | Admitting: Family Medicine

## 2008-04-22 ENCOUNTER — Encounter (INDEPENDENT_AMBULATORY_CARE_PROVIDER_SITE_OTHER): Payer: Self-pay | Admitting: Family Medicine

## 2008-05-27 ENCOUNTER — Encounter (INDEPENDENT_AMBULATORY_CARE_PROVIDER_SITE_OTHER): Payer: Self-pay | Admitting: Family Medicine

## 2008-07-06 ENCOUNTER — Encounter (INDEPENDENT_AMBULATORY_CARE_PROVIDER_SITE_OTHER): Payer: Self-pay | Admitting: Family Medicine

## 2008-07-06 ENCOUNTER — Ambulatory Visit: Payer: Self-pay | Admitting: Family Medicine

## 2008-07-12 ENCOUNTER — Ambulatory Visit: Payer: Self-pay | Admitting: Family Medicine

## 2008-07-12 ENCOUNTER — Telehealth (INDEPENDENT_AMBULATORY_CARE_PROVIDER_SITE_OTHER): Payer: Self-pay | Admitting: Family Medicine

## 2008-08-15 ENCOUNTER — Telehealth (INDEPENDENT_AMBULATORY_CARE_PROVIDER_SITE_OTHER): Payer: Self-pay | Admitting: Family Medicine

## 2008-09-07 ENCOUNTER — Encounter (INDEPENDENT_AMBULATORY_CARE_PROVIDER_SITE_OTHER): Payer: Self-pay | Admitting: Family Medicine

## 2008-09-09 ENCOUNTER — Telehealth: Payer: Self-pay | Admitting: Family Medicine

## 2008-10-14 ENCOUNTER — Telehealth: Payer: Self-pay | Admitting: Family Medicine

## 2008-11-24 ENCOUNTER — Ambulatory Visit: Payer: Self-pay | Admitting: Family Medicine

## 2008-11-24 ENCOUNTER — Encounter: Payer: Self-pay | Admitting: Family Medicine

## 2008-11-24 LAB — CONVERTED CEMR LAB
Nitrite: NEGATIVE
Urobilinogen, UA: 0.2
Whiff Test: NEGATIVE

## 2008-12-22 ENCOUNTER — Telehealth: Payer: Self-pay | Admitting: Family Medicine

## 2008-12-23 ENCOUNTER — Ambulatory Visit: Payer: Self-pay | Admitting: Family Medicine

## 2008-12-23 LAB — CONVERTED CEMR LAB

## 2009-04-21 ENCOUNTER — Emergency Department (HOSPITAL_COMMUNITY): Admission: EM | Admit: 2009-04-21 | Discharge: 2009-04-21 | Payer: Self-pay | Admitting: Emergency Medicine

## 2009-05-27 IMAGING — CR DG HAND COMPLETE 3+V*R*
3 series · 3 of 3 positions shown · non-contrast
Comparison: None

Exam: Right hand 3 views

HISTORY: Hand pain and swelling

[x hand pa right]
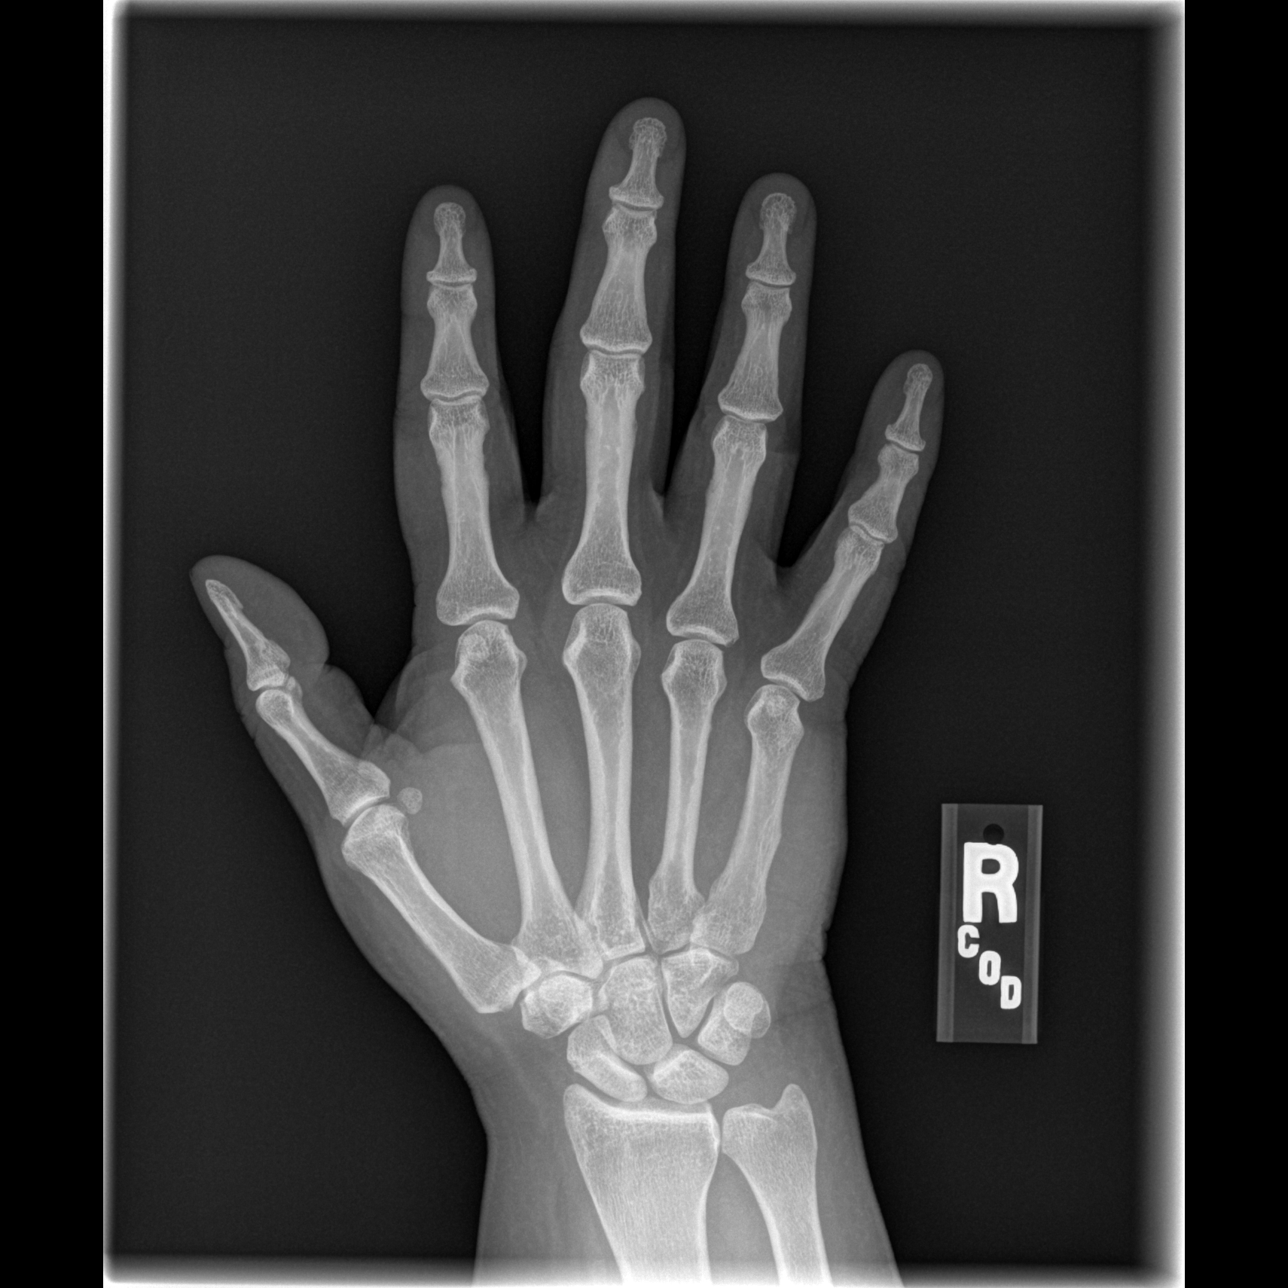

[x hand oblique right]
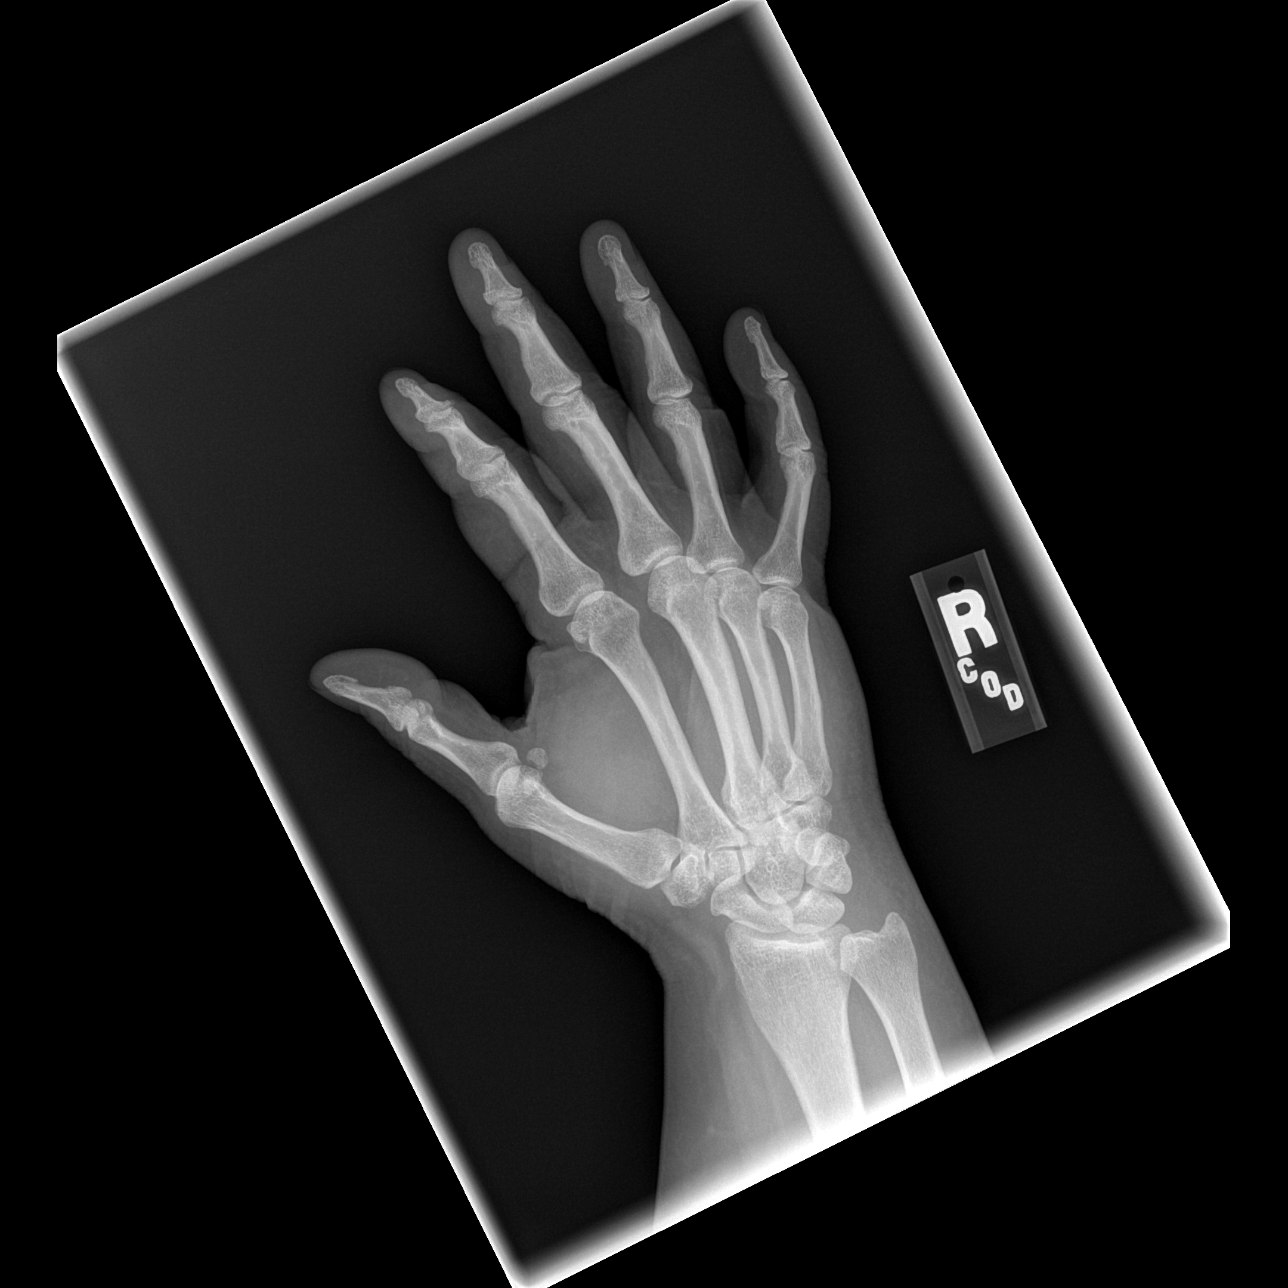

[x hand lat right]
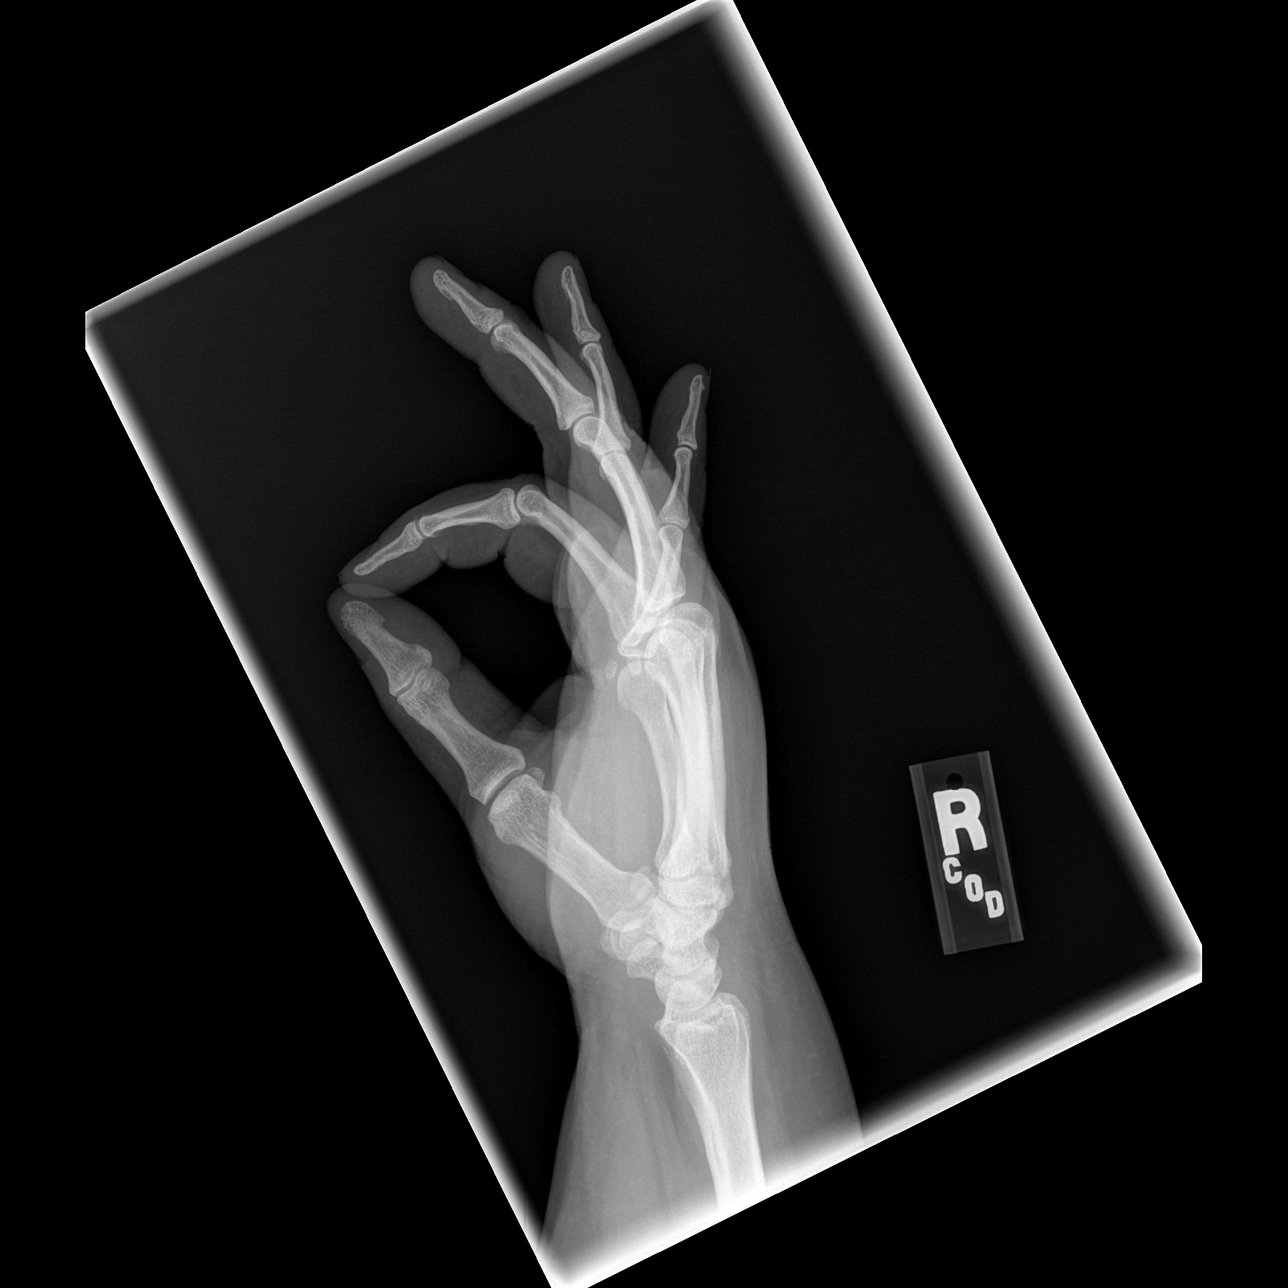

[3 of 3 positions shown; findings below may reference images not displayed]

FINDINGS: There is no acute fractures or dislocations.

Soft tissues are unremarkable.

No pathologic calcifications or radiopaque foreign bodies.
IMPRESSION: 1. No acute fractures or subluxations

## 2009-05-27 IMAGING — CR DG WRIST COMPLETE 3+V*R*
4 series · 4 of 4 positions shown · non-contrast
Comparison: None

Exam: Right wrist 3 views

HISTORY: Right hand pain and swelling

[x wrist pa right]
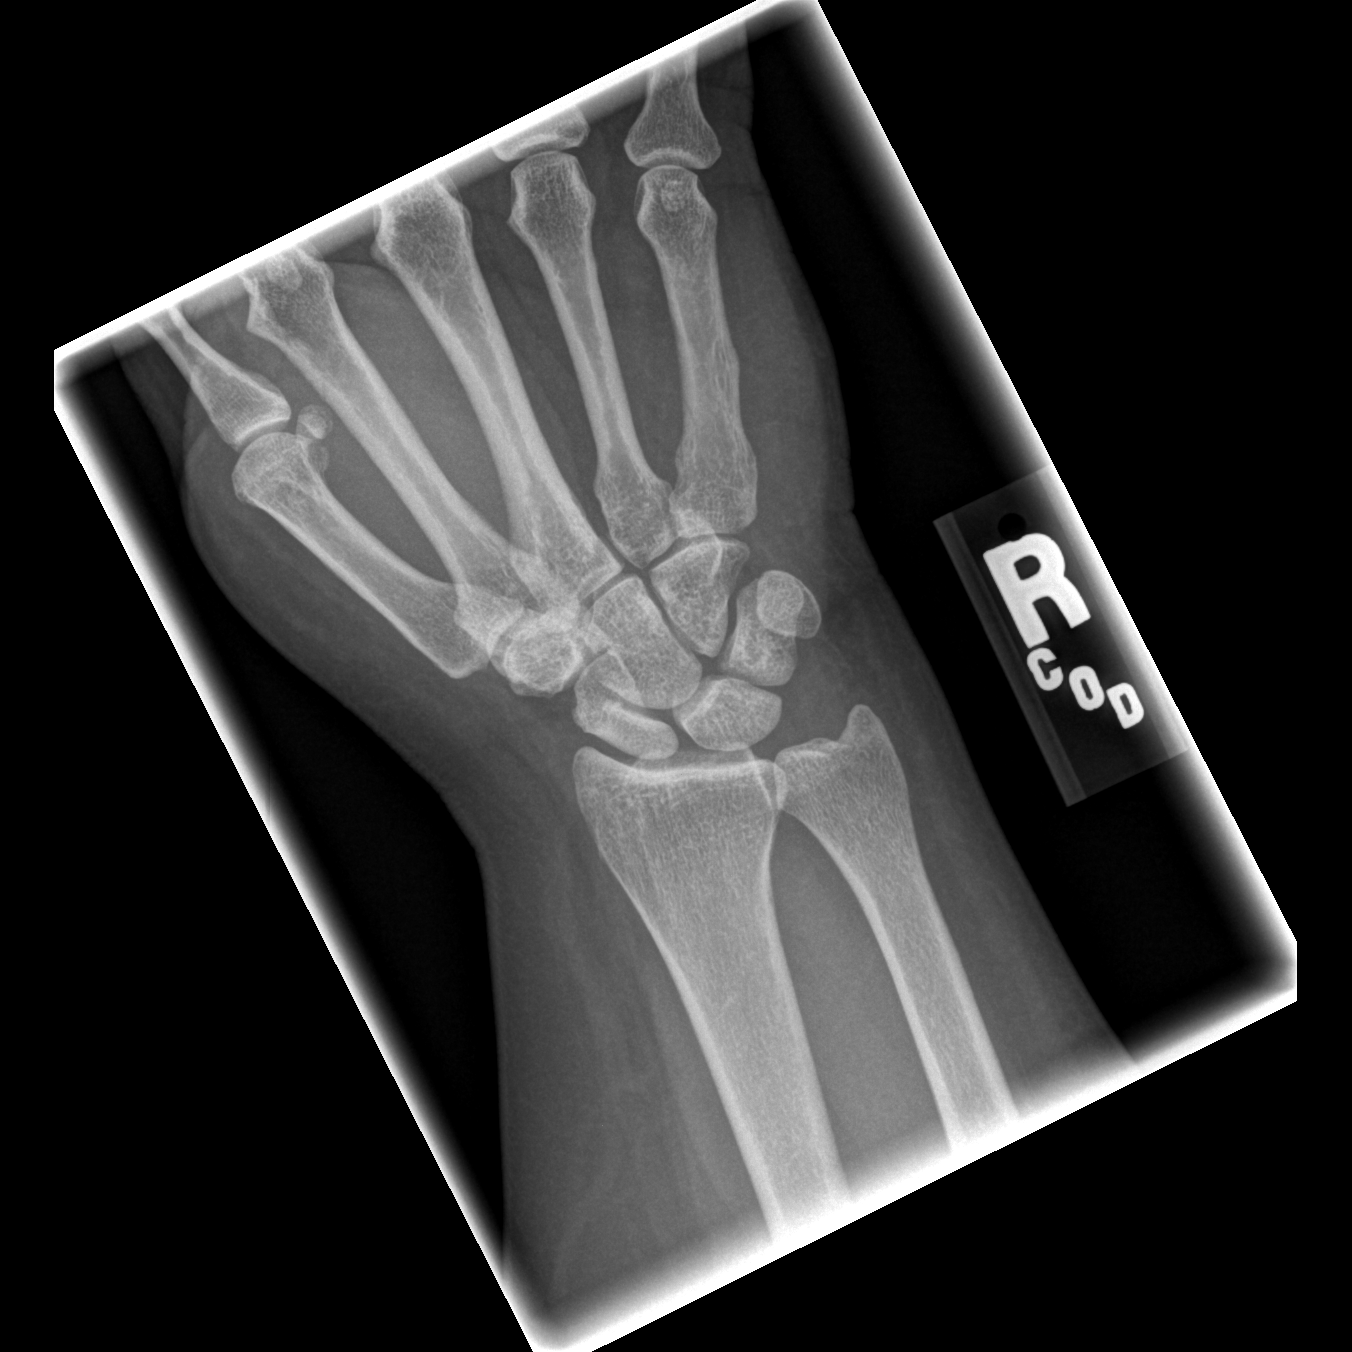

[x wrist obl right]
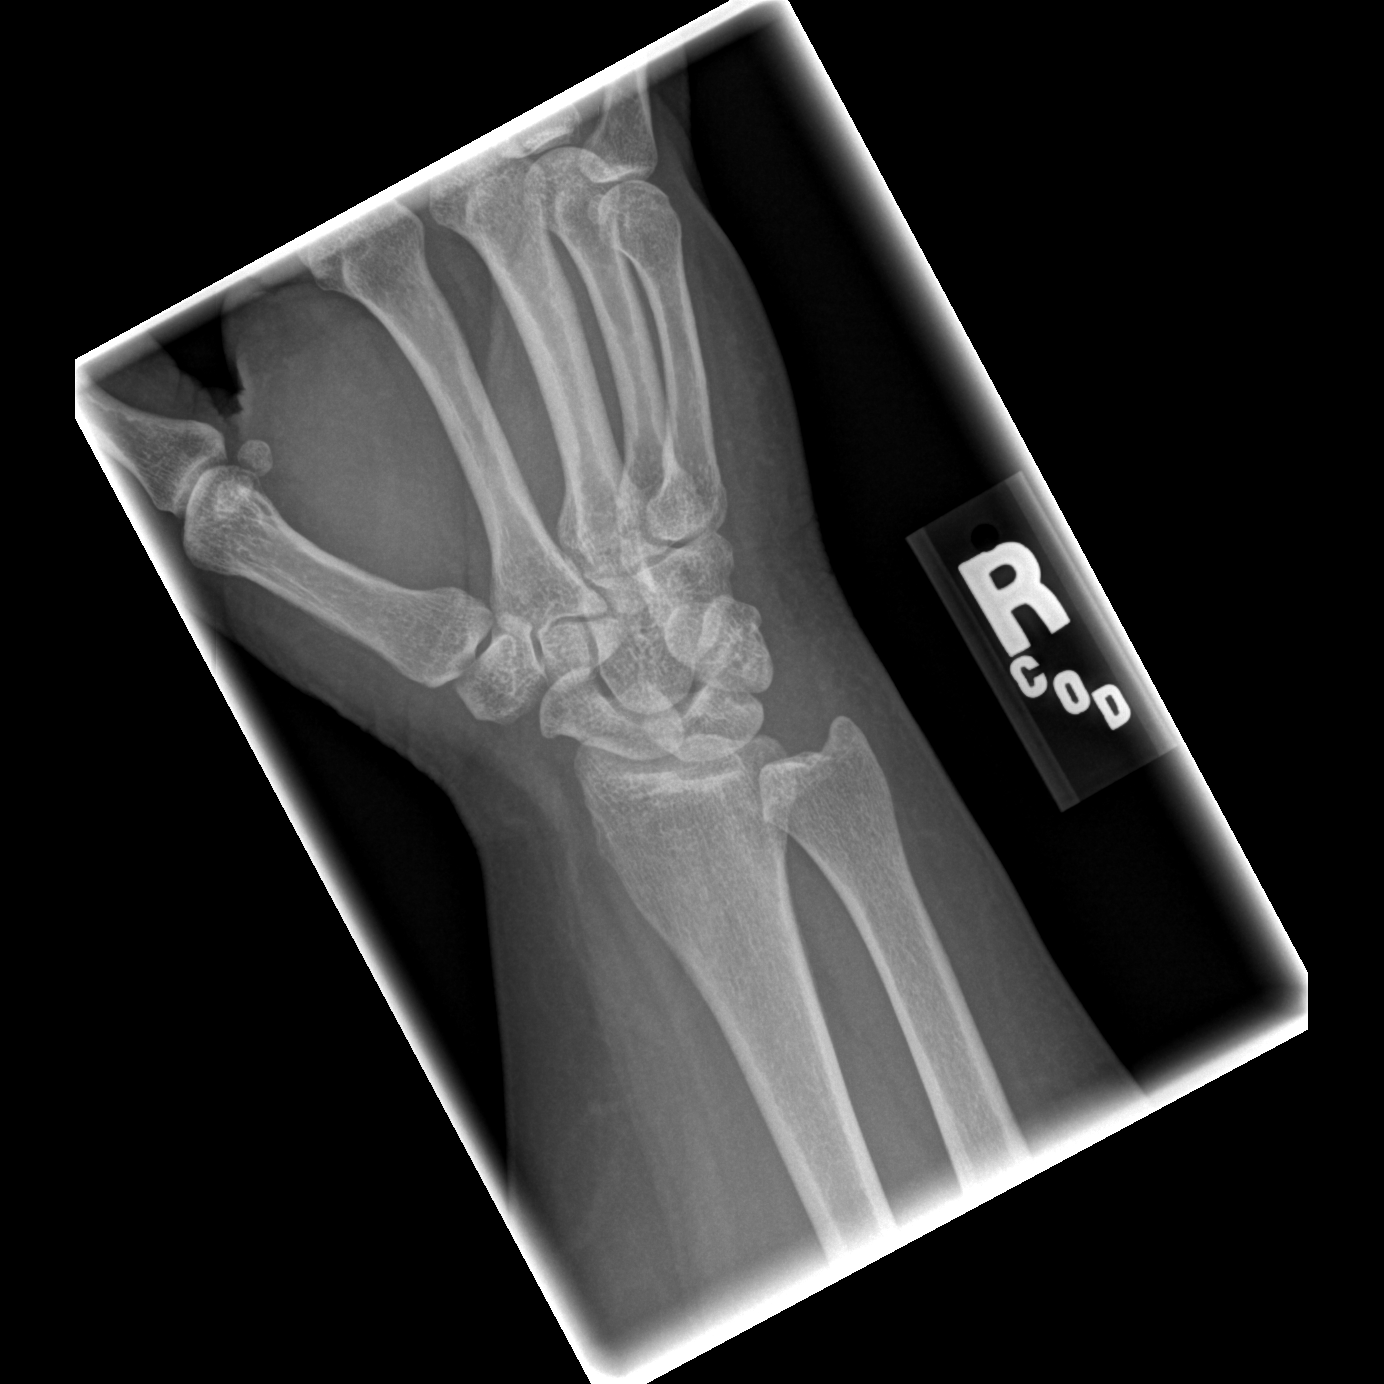

[x wrist lat right]
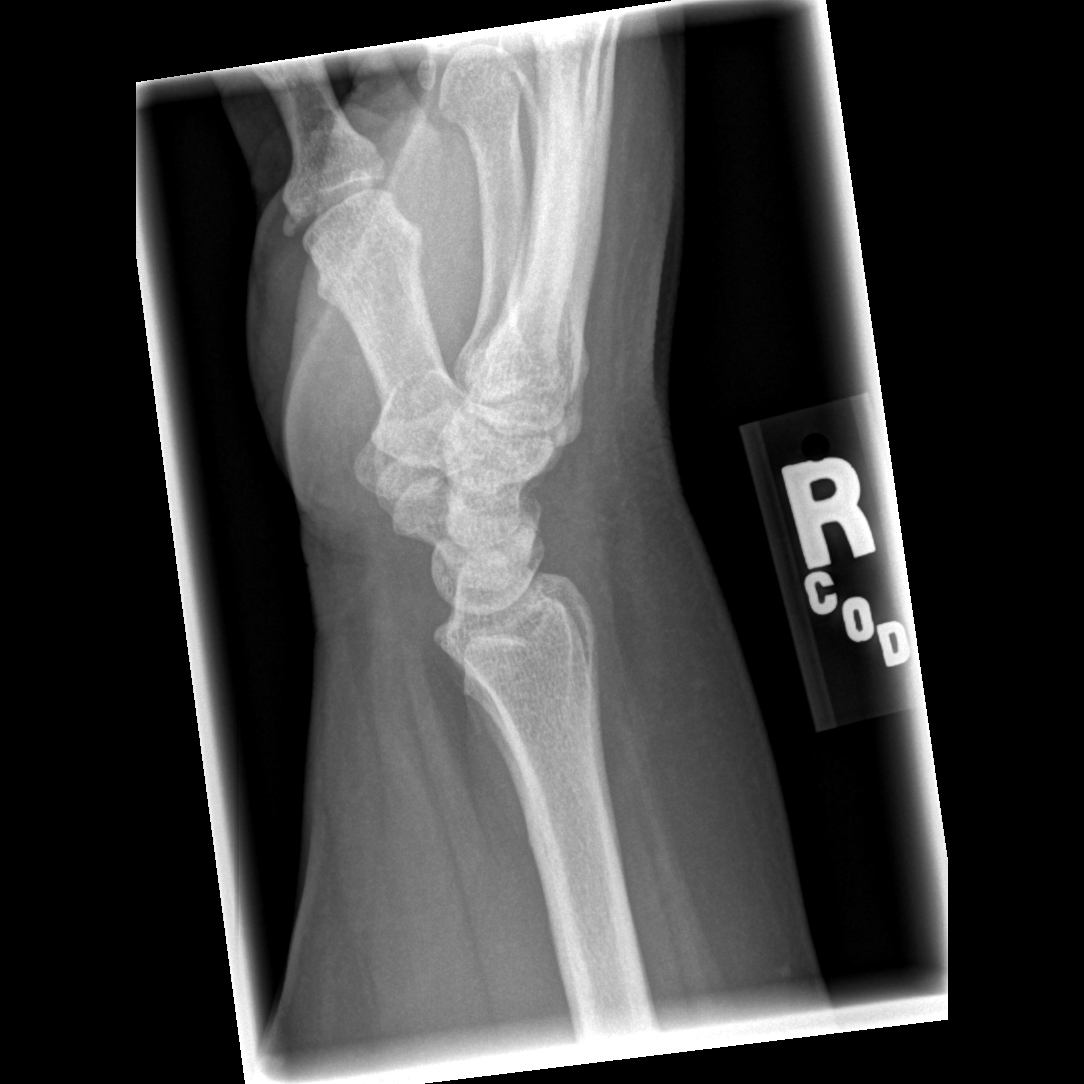

[x navicular]
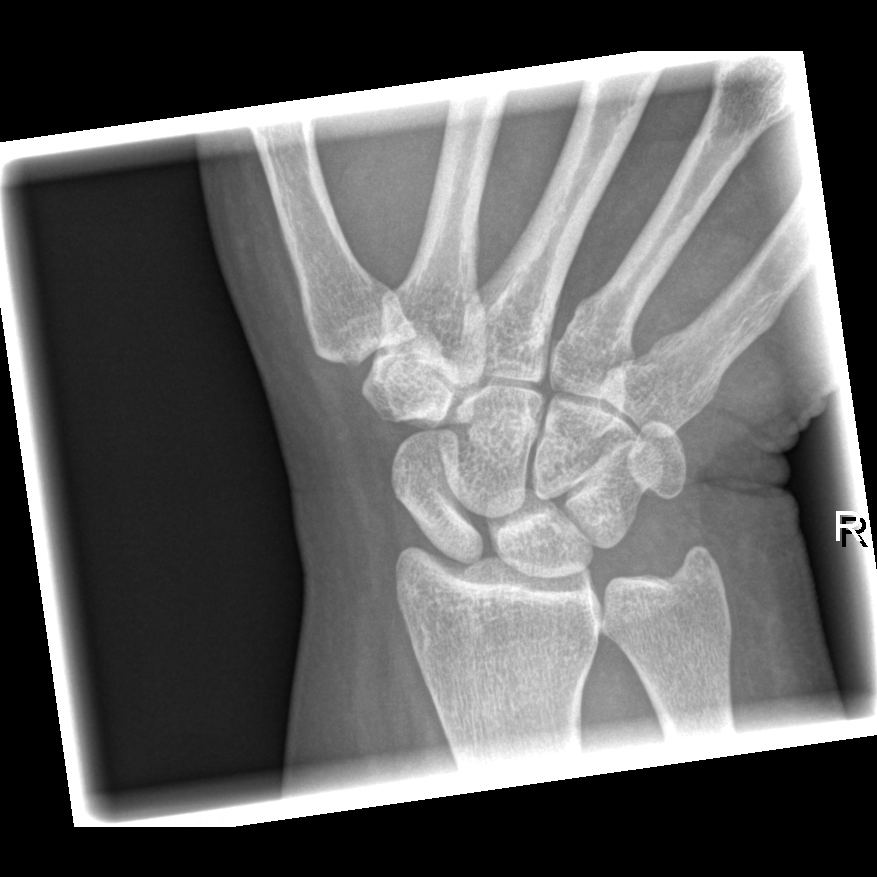

[4 of 4 positions shown; findings below may reference images not displayed]

FINDINGS: There is no acute fractures or dislocations.

Soft tissues are unremarkable.

No pathologic calcifications or radiopaque foreign bodies.
IMPRESSION: 1. No acute fractures or subluxations

## 2009-05-29 ENCOUNTER — Ambulatory Visit: Payer: Self-pay | Admitting: Family Medicine

## 2009-05-29 ENCOUNTER — Telehealth: Payer: Self-pay | Admitting: Family Medicine

## 2009-05-29 ENCOUNTER — Encounter: Payer: Self-pay | Admitting: Family Medicine

## 2009-08-25 ENCOUNTER — Encounter: Payer: Self-pay | Admitting: Family Medicine

## 2010-01-15 ENCOUNTER — Encounter: Payer: Self-pay | Admitting: Sports Medicine

## 2010-01-26 ENCOUNTER — Encounter: Payer: Self-pay | Admitting: Sports Medicine

## 2010-01-26 ENCOUNTER — Ambulatory Visit: Payer: Self-pay | Admitting: Family Medicine

## 2010-01-26 LAB — CONVERTED CEMR LAB
ALT: 22 U/L (ref 0–35)
AST: 19 units/L (ref 0–37)
Albumin: 3.8 g/dL (ref 3.5–5.2)
Alkaline Phosphatase: 61 units/L (ref 39–117)
BUN: 8 mg/dL (ref 6–23)
CO2: 28 meq/L (ref 19–32)
Calcium: 8.5 mg/dL (ref 8.4–10.5)
Chloride: 102 meq/L (ref 96–112)
Creatinine, Ser: 0.65 mg/dL (ref 0.40–1.20)
Direct LDL: 147 mg/dL — ABNORMAL HIGH
Glucose, Bld: 93 mg/dL (ref 70–99)
Potassium: 4 meq/L (ref 3.5–5.3)
Sodium: 136 meq/L (ref 135–145)
TSH: 1.128 u[IU]/mL (ref 0.350–4.500)
Total Bilirubin: 0.4 mg/dL (ref 0.3–1.2)
Total Protein: 6.8 g/dL (ref 6.0–8.3)

## 2010-01-29 ENCOUNTER — Ambulatory Visit: Payer: Self-pay | Admitting: Family Medicine

## 2010-02-28 ENCOUNTER — Ambulatory Visit: Payer: Self-pay

## 2010-04-01 ENCOUNTER — Encounter: Payer: Self-pay | Admitting: Family Medicine

## 2010-04-06 ENCOUNTER — Ambulatory Visit: Admission: RE | Admit: 2010-04-06 | Discharge: 2010-04-06 | Payer: Self-pay | Source: Home / Self Care

## 2010-04-06 ENCOUNTER — Other Ambulatory Visit: Payer: Self-pay | Admitting: Sports Medicine

## 2010-04-06 ENCOUNTER — Other Ambulatory Visit (HOSPITAL_COMMUNITY)
Admission: RE | Admit: 2010-04-06 | Discharge: 2010-04-06 | Disposition: A | Discharge status: Home / Self Care | Payer: Self-pay | Source: Ambulatory Visit | Attending: Sports Medicine | Admitting: Sports Medicine

## 2010-04-06 DIAGNOSIS — Z124 Encounter for screening for malignant neoplasm of cervix: Secondary | ICD-10-CM | POA: Insufficient documentation

## 2010-04-06 LAB — CONVERTED CEMR LAB: Whiff Test: NEGATIVE

## 2010-04-12 NOTE — Assessment & Plan Note (Signed)
Summary: med refills/eo   Vital Signs:  Patient profile:   45 year old female Height:      67.5 inches Weight:      377.5 pounds BMI:     58.46 Temp:     97.5 degrees F oral Pulse rate:   76 / minute BP sitting:   151 / 83  (left arm) Cuff size:   regular  Vitals Entered By: Gladstone Pih (May 29, 2009 3:39 PM) CC: Med refills (GERD, migraines, asthma) Is Patient Diabetic? No Pain Assessment Patient in pain? no        Primary Care Provider:  Lequita Asal  MD  CC:  Med refills (GERD, migraines, and asthma).  History of Present Illness: 45 y/o female here for med refills  GERD- controlled with Zantac.   Asthma- ED visit 4-6 weeks ago for breathing issues. received breathing tx, prednisone, azithromycin. needs refill of albuterol  migraines- several weekly. no longer seeing specialist at [facility]. using vicodin and CoQ-10.   HTN- patient continues to refuse tx secondary to cost. denies chest pain, SOB, peripheral edema.   yeast infection- previously present several months. resolved but returned after recent abx. wet prep and U/A negative for specific pathogen in past. vaginal/genital pruritus, burning. +malodorous vaginal discharge. some suprapubic abdominal pain/fullness. no new sexual partners. no concern about STIs. previously treated with cipro and diflucan.   Habits & Providers  Alcohol-Tobacco-Diet     Tobacco Status: never  Current Medications (verified): 1)  Patanol 0.1 %  Soln (Olopatadine Hcl) .... One Drop in Each Eye Bid 2)  Mg/k Aspartate .... Daily 3)  Coenzyme Q .... Qd 4)  Aerochamber Plus   Misc (Spacer/aero-Holding Chambers) .... Use With Albuterol and Pulmicort  Dx: Asthma 493.0 5)  Ventolin Hfa 108 (90 Base) Mcg/act Aers (Albuterol Sulfate) .... 2 Puffs Inhaled Every 4 Hours As Needed 6)  Vicodin 5-500 Mg Tabs (Hydrocodone-Acetaminophen) .Marland Kitchen.. 1 Tablet By Mouth By Mouth Every 4 Hrs As Needed For Migraine - Max 8 Tabs Per Day. Fill After 06/23/09 7)   Zantac 150 Mg Tabs (Ranitidine Hcl) .... One Tab By Mouth Bid 8)  Cipro 500 Mg Tabs (Ciprofloxacin Hcl) .... One Tab By Mouth Two Times A Day X5 9)  Fluconazole 150 Mg Tabs (Fluconazole) .... One Tab By Mouth Daily X3 Days  Allergies (verified): No Known Drug Allergies  Physical Exam  General:  Morbidly obese, NAD. vitals reviewed.  Mouth:  MMM Lungs:  Normal respiratory effort, chest expands symmetrically. Lungs are clear to auscultation, no crackles or wheezes. Heart:  Normal rate and regular rhythm. S1 and S2 normal without gallop, murmur, click, rub or other extra sounds. Genitalia:  deferred   Impression & Recommendations:  Problem # 1:  ESSENTIAL HYPERTENSION (ICD-401.9) Assessment Unchanged  reiterated that patient should start tx.   Orders: [facility]- Est  Level 4 (16109)  Problem # 2:  ASTHMA, PERSISTENT, MILD (ICD-493.90) Assessment: Unchanged  refill of albuterol given.  Her updated medication list for this problem includes:    Ventolin Hfa 108 (90 Base) Mcg/act Aers (Albuterol sulfate) .Marland Kitchen... 2 puffs inhaled every 4 hours as needed  Orders: [facility]- Est  Level 4 (99214)  Problem # 3:  GASTROESOPHAGEAL REFLUX, NO ESOPHAGITIS (ICD-530.81) Assessment: Unchanged  refill of zantac given  Her updated medication list for this problem includes:    Zantac 150 Mg Tabs (Ranitidine hcl) ..... One tab by mouth bid  Orders: [facility]- Est  Level 4 (60454)  Problem # 4:  MIGRAINE NOS W/INTRACTABLE MIGRAINE (ICD-346.91) Assessment: Unchanged  refill of vicodin provided and controlled substance contract signed.   Her updated medication list for this problem includes:    Vicodin 5-500 Mg Tabs (Hydrocodone-acetaminophen) .Marland Kitchen... 1 tablet by mouth by mouth every 4 hrs as needed for migraine - max 8 tabs per day. fill after 06/23/09  Orders: [facility]- Est  Level 4 (98119)  Problem # 5:  VAGINAL DISCHARGE (ICD-623.5) Assessment: Deteriorated  pelvic deferred. refill for cipro and diflucan  given.   Orders: [facility]- Est  Level 4 (14782) Prescriptions: VICODIN 5-500 MG TABS (HYDROCODONE-ACETAMINOPHEN) 1 tablet by mouth by mouth every 4 hrs as needed for migraine - max 8 tabs per day. fill after 06/23/09  #60 x 3   Entered and Authorized by:   Lequita Asal  MD   Signed by:   Lequita Asal  MD on 05/29/2009   Method used:   Print then Give to Patient   RxID:   [id number] FLUCONAZOLE 150 MG TABS (FLUCONAZOLE) one tab by mouth daily x3 days  #3 x 0   Entered and Authorized by:   Lequita Asal  MD   Signed by:   Lequita Asal  MD on 05/29/2009   Method used:   Electronically to        [facility] [address] (retail)       [phone]       [place], [place]  [address]       Ph: [phone]       Fax: [phone]   RxID:   [id number] CIPRO 500 MG TABS (CIPROFLOXACIN HCL) one tab by mouth two times a day x5  #10 x 0   Entered and Authorized by:   Lequita Asal  MD   Signed by:   Lequita Asal  MD on 05/29/2009   Method used:   Electronically to        [facility] [address] (retail)       [phone]       [place], [place]  [address]       Ph: [phone]       Fax: [phone]   RxID:   [id number] VICODIN 5-500 MG TABS (HYDROCODONE-ACETAMINOPHEN) 1 tablet by mouth by mouth every 4 hrs as needed for migraine - max 8 tabs per day  #70 x 0   Entered and Authorized by:   Lequita Asal  MD   Signed by:   Lequita Asal  MD on 05/29/2009   Method used:   Print then Give to Patient   RxID:   [id number] ZANTAC 150 MG TABS (RANITIDINE HCL) one tab by mouth bid  #60 x 4   Entered and Authorized by:   Lequita Asal  MD   Signed by:   Lequita Asal  MD on 05/29/2009   Method used:   Electronically to        [facility] [address] (retail)       [phone]       [place], [place]  [address]       Ph: [phone]       Fax:  [phone]   RxID:   [id number] VENTOLIN HFA 108 (90 BASE) MCG/ACT AERS (ALBUTEROL SULFATE) 2 puffs inhaled every 4 hours as needed  #1 x 1   Entered and Authorized by:   Cathrine Muster  Nelline Lio  MD   Signed by:   Lequita Asal  MD on 05/29/2009   Method used:   Electronically to        [facility] [address] (retail)       [phone]       [place], [place]  [address]       Ph: [phone]       Fax: [phone]   RxID:   [id number]

## 2010-04-12 NOTE — Progress Notes (Signed)
Summary: meds prob   Phone Note Call from Patient Call back at [phone]   Caller: Patient Summary of Call: states that it is cheaper for the Albuterol instead of Ventolin [facility] [address] pt is there Initial call taken by: De Nurse,  May 29, 2009 4:32 PM  Follow-up for Phone Call        there is no generic now. only brand names (proair, ventolin, proventil) Follow-up by: Lequita Asal  MD,  May 29, 2009 4:34 PM    pt was told by pharmacist that dr would need to fax an rx for albuterol .Loralee Pacas CMA  May 29, 2009 4:41 PM  rx given to t. Rayetta Humphrey  MD  May 29, 2009 5:04 PM  Appended Document: meds prob rx faxed to [facility].

## 2010-04-12 NOTE — Assessment & Plan Note (Signed)
Summary: pap/kh   Vital Signs:  Patient profile:   45 year old female Height:      67.5 inches Weight:      387.1 pounds BMI:     59.95 Pulse rate:   85 / minute BP sitting:   156 / 85  (left arm) Cuff size:   large  Vitals Entered By: Jimmy Footman, CMA (April 06, 2010 3:44 PM) CC: pap   Primary Care Provider:  Rodney Langton MD  CC:  pap.  History of Present Illness: 45 yo female here for routine pap.  Recently dx with vaginal candidiasis, taking miconazole, >3 infections per year.  Current Medications (verified): 1)  Patanol 0.1 %  Soln (Olopatadine Hcl) .... One Drop in Each Eye Bid 2)  Mg/k Aspartate .... Daily 3)  Coenzyme Q .... Qd 4)  Aerochamber Plus   Misc (Spacer/aero-Holding Chambers) .... Use With Albuterol and Pulmicort  Dx: Asthma 493.0 5)  Ventolin Hfa 108 (90 Base) Mcg/act Aers (Albuterol Sulfate) .... 2 Puffs Inhaled Every 4 Hours As Needed 6)  Vicodin 5-500 Mg Tabs (Hydrocodone-Acetaminophen) .Marland Kitchen.. 1 Tablet By Mouth By Mouth Every 4 Hrs As Needed For Migraine - Max 8 Tabs Per Day. Fill After 06/23/09 7)  Zantac 150 Mg Tabs (Ranitidine Hcl) .... One Tab By Mouth Bid 8)  Lisinopril-Hydrochlorothiazide 20-25 Mg Tabs (Lisinopril-Hydrochlorothiazide) .... One-Half Tab By Mouth Daily 9)  Coreg 6.25 Mg Tabs (Carvedilol) .... One Tab By Mouth Two Times A Day  Allergies (verified): No Known Drug Allergies  Review of Systems       See HPI  Physical Exam  General:  Well-developed,well-nourished,in no acute distress; alert,appropriate and cooperative throughout examination Abdomen:  Bowel sounds positive,abdomen soft and non-tender without masses, organomegaly or hernias noted. Genitalia:  Normal introitus for age, no external lesions, no vaginal discharge, mucosa pink and moist, no vaginal or cervical lesions, no vaginal atrophy, no friaility or hemorrhage, normal uterus size and position, no adnexal masses or tenderness   Impression &  Recommendations:  Problem # 1:  SCREENING FOR MALIGNANT NEOPLASM OF THE CERVIX (ICD-V76.2) Assessment Unchanged PAP done. WP to vaginal dc.  Orders: Pap Smear-[facility] ([address]-60454) [facility] - Est  40-64 yrs (09811)  Problem # 2:  VAGINAL DISCHARGE (ICD-623.5) Assessment: New Suggestive of yeast. Chronic suppressive therapy for diflucan.  Orders: [facility] - Est  40-64 yrs (91478) Wet Prep- [facility] [phone])  Problem # 3:  DENTAL CARIES (ICD-521.00) Assessment: New Orders: Dental Referral (Dentist)  Problem # 4:  ESSENTIAL HYPERTENSION (ICD-401.9) Assessment: Improved Increasing coreg.  Her updated medication list for this problem includes:    Lisinopril-hydrochlorothiazide 20-25 Mg Tabs (Lisinopril-hydrochlorothiazide) ..... One  tab by mouth daily    Coreg 12.5 Mg Tabs (Carvedilol) ..... One tab by mouth two times a day  Complete Medication List: 1)  Patanol 0.1 % Soln (Olopatadine hcl) .... One drop in each eye bid 2)  Mg/k Aspartate  .... Daily 3)  Coenzyme Q  .... Qd 4)  Aerochamber Plus Misc (Spacer/aero-holding chambers) .... Use with albuterol and pulmicort  dx: asthma 493.0 5)  Ventolin Hfa 108 (90 Base) Mcg/act Aers (Albuterol sulfate) .... 2 puffs inhaled every 4 hours as needed 6)  Vicodin 5-500 Mg Tabs (Hydrocodone-acetaminophen) .Marland Kitchen.. 1 tablet by mouth by mouth every 4 hrs as needed for migraine - max 8 tabs per day. fill after 06/23/09 7)  Zantac 150 Mg Tabs (Ranitidine hcl) .... One tab by mouth bid 8)  Lisinopril-hydrochlorothiazide 20-25 Mg Tabs (Lisinopril-hydrochlorothiazide) .... One  tab by mouth daily 9)  Coreg 12.5 Mg Tabs (Carvedilol) .... One tab by mouth two times a day 10)  Fluconazole 150 Mg Tabs (Fluconazole) .... One tab by mouth q3d x 3, then q weekly for 6 months. Prescriptions: LISINOPRIL-HYDROCHLOROTHIAZIDE 20-25 MG TABS (LISINOPRIL-HYDROCHLOROTHIAZIDE) One  tab by mouth daily  #90 x 1   Entered and Authorized by:   Rodney Langton MD   Signed by:    Rodney Langton MD on 04/06/2010   Method used:   Print then Give to Patient   RxID:   [id number] COREG 12.5 MG TABS (CARVEDILOL) One tab by mouth two times a day  #60 x 3   Entered and Authorized by:   Rodney Langton MD   Signed by:   Rodney Langton MD on 04/06/2010   Method used:   Print then Give to Patient   RxID:   [id number] FLUCONAZOLE 150 MG TABS (FLUCONAZOLE) One tab by mouth q3d x 3, then q weekly for 6 months.  #27 x 0   Entered and Authorized by:   Rodney Langton MD   Signed by:   Rodney Langton MD on 04/06/2010   Method used:   Print then Give to Patient   RxID:   [id number]    Orders Added: 1)  Pap Smear-[facility] [[id number]] 2)  [facility] - Est  40-64 yrs [99396] 3)  [facility]34[facility] 4)  Dental Referral [Dentist]    Laboratory Results  Date/Time Received: April 06, 2010 4:14 PM  Date/Time Reported: April 06, 2010 4:28 PM   Wet [place] Source: vag WBC/hpf: >20 Bacteria/hpf: 2+  Rods Clue cells/hpf: none  Negative whiff Yeast/hpf: rare Trichomonas/hpf: none Comments: ...............test performed by......Marland KitchenBonnie A. [place], MLS (ASCP)cm

## 2010-04-12 NOTE — Miscellaneous (Signed)
  Patient unwilling to pursue treatment for most of chronic medical issues (HTN, HLD, etc) due to cost. Has severe migraines for which she was previously seen at [facility]. ended up on chronic opiods for migraines. doesn't ask for early refills. Would continue to stress need to address chronic issues.      Clinical Lists Changes  Problems: Removed problem of GYNECOLOGICAL EXAMINATIONOUTINE (ICD-V72.31) Removed problem of SCREENING FOR MALIGNANT NEOPLASM OF THE CERVIX (ICD-V76.2)

## 2010-04-12 NOTE — Assessment & Plan Note (Signed)
Summary: F/U  MEDS/KH   Vital Signs:  Patient profile:   45 year old female Weight:      379 pounds Temp:     97.7 degrees F oral Pulse rate:   89 / minute Pulse rhythm:   regular BP sitting:   154 / 77  (left arm) Cuff size:   large  Vitals Entered By: Loralee Pacas CMA (January 29, 2010 8:56 AM) CC: medication refill Is Patient Diabetic? No   Primary Care Dean Wonder:  Rodney Langton MD  CC:  medication refill.  History of Present Illness: 45 yo female with HTN, obesity, HLD here for fu.  Migraines:  Present for decades, gets aura with visual scotomata, then develops HA, bilateral, associated with nausea, photophobia.  Has tried topamax, imitrex, tramadol, stadol, the only thing that works is vicodin.  HTN:  Started on lisinopril/hctz, having back pain she thinks is from the medication.  Advised that this was unlikely but she wanted to stop it.  BP still high today but better.  HLD:  Cholesterol reviewed, still above LDL:130 threshold for her.  Chest pressure: has been present for >3 years, constant, has had cath in 2009 that was neg.  Deemed unlikely cardiac by Dr. Antoine Poche with [place] cards.  Pt amenable to discuss at another visit.  Habits & Providers  Alcohol-Tobacco-Diet     Tobacco Status: never  Exercise-Depression-Behavior     Does Patient Exercise: no     Exercise Counseling: to improve exercise regimen     Have you felt down or hopeless? no     Have you felt little pleasure in things? no     Depression Counseling: not indicated; screening negative for depression  Current Medications (verified): 1)  Patanol 0.1 %  Soln (Olopatadine Hcl) .... One Drop in Each Eye Bid 2)  Mg/k Aspartate .... Daily 3)  Coenzyme Q .... Qd 4)  Aerochamber Plus   Misc (Spacer/aero-Holding Chambers) .... Use With Albuterol and Pulmicort  Dx: Asthma 493.0 5)  Ventolin Hfa 108 (90 Base) Mcg/act Aers (Albuterol Sulfate) .... 2 Puffs Inhaled Every 4 Hours As Needed 6)  Vicodin  5-500 Mg Tabs (Hydrocodone-Acetaminophen) .Marland Kitchen.. 1 Tablet By Mouth By Mouth Every 4 Hrs As Needed For Migraine - Max 8 Tabs Per Day. Fill After 06/23/09 7)  Zantac 150 Mg Tabs (Ranitidine Hcl) .... One Tab By Mouth Bid 8)  Lisinopril-Hydrochlorothiazide 20-25 Mg Tabs (Lisinopril-Hydrochlorothiazide) .... One-Half Tab By Mouth Daily 9)  Coreg 6.25 Mg Tabs (Carvedilol) .... One Tab By Mouth Two Times A Day  Allergies (verified): No Known Drug Allergies  Social History: Does Patient Exercise:  no  Review of Systems       See HPI  Physical Exam  General:  Well-developed,well-nourished,in no acute distress; alert,appropriate and cooperative throughout examination Lungs:  Normal respiratory effort, chest expands symmetrically. Lungs are clear to auscultation, no crackles or wheezes. Heart:  Normal rate and regular rhythm. S1 and S2 normal without gallop, murmur, click, rub or other extra sounds.   Impression & Recommendations:  Problem # 1:  MIGRAINE NOS W/INTRACTABLE MIGRAINE (ICD-346.91) Assessment Unchanged Will rx refill vicodin.  Pt uses up to 60 per month. Coreg may have some beneficial preventive effects.  Her updated medication list for this problem includes:    Vicodin 5-500 Mg Tabs (Hydrocodone-acetaminophen) .Marland Kitchen... 1 tablet by mouth by mouth every 4 hrs as needed for migraine - max 8 tabs per day. fill after 06/23/09    Coreg 6.25 Mg Tabs (Carvedilol) .Marland KitchenMarland KitchenMarland KitchenMarland Kitchen  One tab by mouth two times a day  Problem # 2:  ESSENTIAL HYPERTENSION (ICD-401.9) Assessment: Improved Better with lisinopril/HCTZ. Pt amenable to cutting this in half for now and then increasing in the future if she feels better. Will add coreg, she has taken this and toprol in the past without bronchospasm. RTC 1 month to recheck.  Her updated medication list for this problem includes:    Lisinopril-hydrochlorothiazide 20-25 Mg Tabs (Lisinopril-hydrochlorothiazide) ..... One-half tab by mouth daily    Coreg 6.25 Mg  Tabs (Carvedilol) ..... One tab by mouth two times a day  Orders: [facility]- Est  Level 4 (99214)  Problem # 3:  HYPERCHOLESTEROLEMIA (ICD-272.0) Assessment: Unchanged Pt will diet control as in instructions. Goal <130 LDL.  The following medications were removed from the medication list:    Pravastatin Sodium 40 Mg Tabs (Pravastatin sodium) ..... One tab by mouth daily  Orders: [facility]- Est  Level 4 (38756)  Problem # 4:  OBESITY, NOS (ICD-278.00) Assessment: Unchanged Pt plans to lose 30 lbs.  Orders: [facility]- Est  Level 4 (43329)  Problem # 5:  Preventive Health Care (ICD-V70.0) Assessment: Comment Only Still awaiting Mammo. Needs PAP.  Complete Medication List: 1)  Patanol 0.1 % Soln (Olopatadine hcl) .... One drop in each eye bid 2)  Mg/k Aspartate  .... Daily 3)  Coenzyme Q  .... Qd 4)  Aerochamber Plus Misc (Spacer/aero-holding chambers) .... Use with albuterol and pulmicort  dx: asthma 493.0 5)  Ventolin Hfa 108 (90 Base) Mcg/act Aers (Albuterol sulfate) .... 2 puffs inhaled every 4 hours as needed 6)  Vicodin 5-500 Mg Tabs (Hydrocodone-acetaminophen) .Marland Kitchen.. 1 tablet by mouth by mouth every 4 hrs as needed for migraine - max 8 tabs per day. fill after 06/23/09 7)  Zantac 150 Mg Tabs (Ranitidine hcl) .... One tab by mouth bid 8)  Lisinopril-hydrochlorothiazide 20-25 Mg Tabs (Lisinopril-hydrochlorothiazide) .... One-half tab by mouth daily 9)  Coreg 6.25 Mg Tabs (Carvedilol) .... One tab by mouth two times a day  Patient Instructions: 1)  OK to take lisinopril.HCTZ 1/2 tab daily. 2)  New medicine, carvedilol two times a day. 3)  refilled vicodin, 3 refills.  No more refills for 4 months. 4)  substitute half of your animal-product based meals for vegetable based meals. 5)  Come back to see me in 1 month. 6)  -Dr. Karie Schwalbe. Prescriptions: VICODIN 5-500 MG TABS (HYDROCODONE-ACETAMINOPHEN) 1 tablet by mouth by mouth every 4 hrs as needed for migraine - max 8 tabs per day. fill after 06/23/09   #60 x 3   Entered and Authorized by:   Rodney Langton MD   Signed by:   Rodney Langton MD on 01/29/2010   Method used:   Print then Give to Patient   RxID:   [id number] COREG 6.25 MG TABS (CARVEDILOL) One tab by mouth two times a day  #60 x 1   Entered and Authorized by:   Rodney Langton MD   Signed by:   Rodney Langton MD on 01/29/2010   Method used:   Print then Give to Patient   RxID:   [id number]    Orders Added: 1)  [facility]- Est  Level 4 [70623]

## 2010-04-12 NOTE — Miscellaneous (Signed)
Summary: [facility] Cont[facility] Substance Contract  [facility] Cont[facility] Substance Contract   Imported By: Clydell Hakim 05/30/2009 14:42:08  _____________________________________________________________________  External Attachment:    Type:   Image     Comment:   External Document

## 2010-04-12 NOTE — Miscellaneous (Signed)
   Clinical Lists Changes  Problems: Removed problem of CHOLECYSTECTOMY, HX OF (ICD-V45.79)

## 2010-04-12 NOTE — Assessment & Plan Note (Signed)
Summary: medicine refill, pt says will make payment toward visit/bmc   FLU SHOT GIVEN TODAY.Jimmy Footman, CMA  January 26, 2010 10:13 AM  Vital Signs:  Patient profile:   45 year old female Height:      67.5 inches Weight:      385 pounds BMI:     59.62 Temp:     97.9 degrees F oral Pulse rate:   80 / minute BP sitting:   175 / 80  (left arm) Cuff size:   regular  Vitals Entered By: Jimmy Footman, CMA (January 26, 2010 9:18 AM) CC: med refill x2  Is Patient Diabetic? No   Primary Care Provider:  Rodney Langton MD  CC:  med refill x2 .  History of Present Illness: 45 yo female here for fu of chronic illnesses.  She is due for multiple preventive tests.  See A/P for further details.  Habits & Providers  Alcohol-Tobacco-Diet     Tobacco Status: never  Current Medications (verified): 1)  Patanol 0.1 %  Soln (Olopatadine Hcl) .... One Drop in Each Eye Bid 2)  Mg/k Aspartate .... Daily 3)  Coenzyme Q .... Qd 4)  Aerochamber Plus   Misc (Spacer/aero-Holding Chambers) .... Use With Albuterol and Pulmicort  Dx: Asthma 493.0 5)  Ventolin Hfa 108 (90 Base) Mcg/act Aers (Albuterol Sulfate) .... 2 Puffs Inhaled Every 4 Hours As Needed 6)  Vicodin 5-500 Mg Tabs (Hydrocodone-Acetaminophen) .Marland Kitchen.. 1 Tablet By Mouth By Mouth Every 4 Hrs As Needed For Migraine - Max 8 Tabs Per Day. Fill After 06/23/09 7)  Zantac 150 Mg Tabs (Ranitidine Hcl) .... One Tab By Mouth Bid 8)  Lisinopril-Hydrochlorothiazide 20-25 Mg Tabs (Lisinopril-Hydrochlorothiazide) .... One Tab By Mouth Daily  Allergies (verified): No Known Drug Allergies  Past History:  Past Medical History: Last updated: 12/01/2007 Migraines Hyperlipidemia Allergic rhinitis Obesity Asthma Carpal tunnel syndrome Normal stress test 11/2006 h/o rheumatic fever cardiac cath 5/09 - Noraml EF.  Normal arteries except narrow distal LAD that is probably normal.  (Hochrein) Left arm cervical radiculopathy pain with MRI impingement at  C6-C7 level (Dr. Bettina Gavia - 10/14/07) - surgery  Past Surgical History: Last updated: 11/24/2008 Lt ankle arthroscopy - 03/11/1998 bilateral carpal tunnel surgery 08/2005 by dr. Yisroel Ramming s/p cholescystectomy and umbilical hernia repair - 03/2007 s/p Anterior Cervical Discectomy (C6-7, C5-6), Arthroodesis, Allograft with Cervical Plating - 8/09  Family History: Last updated: 05/08/2006 DM is 2 maternal aunts, sister  and MGM, Father died age 58 multiple myeloma, MI age 74, had colon CA (age 88`s), Mother living, has colon CA (age 31`s), DM, sister with diverticulitis  Social History: Last updated: 11/24/2008 no tob/etoh/drugs.  Married.  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 Lungs are clear to auscultation, no crackles or wheezes. Heart:  Normal rate and regular rhythm. S1 and S2 normal without gallop, murmur, click, rub or other extra sounds. Abdomen:  Bowel sounds positive,abdomen soft and non-tender without masses, organomegaly or hernias noted.  Obese. Extremities:  No edema.   Impression & Recommendations:  Problem # 1:  UNSPECIFIED BREAST SCREENING (ICD-V76.10) Assessment New Mammo ordered.  Orders: Mammogram (Screening) (Mammo)  Problem # 2:  MIGRAINE NOS W/INTRACTABLE MIGRAINE (ICD-346.91) Assessment: Unchanged Will discuss at next visit. Pt demanded vicodin rx this visit.  Her updated medication list for this problem includes:    Vicodin 5-500 Mg Tabs (Hydrocodone-acetaminophen) .Marland Kitchen... 1 tablet by mouth by mouth every 4 hrs as needed for migraine - max 8 tabs  per day. fill after 06/23/09  Problem # 3:  ESSENTIAL HYPERTENSION (ICD-401.9) Assessment: Deteriorated Very elevated. Pt at first resistant to medication. Finally agreed after discussion to start antihypertensive. RTC 1-2 weeks to recheck BP and chemistries.  Her updated medication list for this problem includes:    Lisinopril-hydrochlorothiazide 20-25 Mg Tabs (Lisinopril-hydrochlorothiazide) ..... One tab by mouth daily  Orders: [facility]- Est  Level 4 (99214) Comp Met-[facility] (57846-96295)  Problem # 4:  HYPERCHOLESTEROLEMIA (ICD-272.0) Assessment: Deteriorated  Checking dLDL. Very elevated. Will start pravastatin 40 and recheck in 3 months.  Orders: [facility]- Est  Level 4 (28413) Comp Met-[facility] (24401-02725) Direct LDL-[facility] (36644-03474)  Her updated medication list for this problem includes:    Pravastatin Sodium 40 Mg Tabs (Pravastatin sodium) ..... One tab by mouth daily  Complete Medication List: 1)  Patanol 0.1 % Soln (Olopatadine hcl) .... One drop in each eye bid 2)  Mg/k Aspartate  .... Daily 3)  Coenzyme Q  .... Qd 4)  Aerochamber Plus Misc (Spacer/aero-holding chambers) .... Use with albuterol and pulmicort  dx: asthma 493.0 5)  Ventolin Hfa 108 (90 Base) Mcg/act Aers (Albuterol sulfate) .... 2 puffs inhaled every 4 hours as needed 6)  Vicodin 5-500 Mg Tabs (Hydrocodone-acetaminophen) .Marland Kitchen.. 1 tablet by mouth by mouth every 4 hrs as needed for migraine - max 8 tabs per day. fill after 06/23/09 7)  Zantac 150 Mg Tabs (Ranitidine hcl) .... One tab by mouth bid 8)  Lisinopril-hydrochlorothiazide 20-25 Mg Tabs (Lisinopril-hydrochlorothiazide) .... One tab by mouth daily 9)  Pravastatin Sodium 40 Mg Tabs (Pravastatin sodium) .... One tab by mouth daily  Other Orders: TSH-[facility] (25956-38756) Flu Vaccine 22yrs + (43329) Admin 1st Vaccine (51884)  Patient Instructions: 1)  Lisinopril/HCTZ for BP ($4 at [facility]) 2)  Checking bloodowork. 3)  Come back to see me in 1-2 weeks to recheck BP  and go over labs and do PAP smear. 4)  Mammogram ordered. 5)  -Dr. Karie Schwalbe. Prescriptions: PRAVASTATIN SODIUM 40 MG TABS (PRAVASTATIN SODIUM) One tab by mouth daily  #90 x 0   Entered and Authorized by:   Rodney Langton MD   Signed by:   Rodney Langton MD on 01/26/2010   Method used:   Electronically to        [facility] [address] (retail)       [id number]       [place], [place]  [address]       Ph: [phone]       Fax: [phone]   RxID:   [id number] ZANTAC 150 MG TABS (RANITIDINE HCL) one tab by mouth bid  #60 x 4   Entered and Authorized by:   Rodney Langton MD   Signed by:   Rodney Langton MD on 01/26/2010   Method used:   Print then Give to Patient   RxID:   [id number] VENTOLIN HFA 108 (90 BASE) MCG/ACT AERS (ALBUTEROL SULFATE) 2 puffs inhaled every 4 hours as needed  #2 x 0   Entered and Authorized by:   Rodney Langton MD   Signed by:   Maisie Fus  Thekkekandam MD on 01/26/2010   Method used:   Print then Give to Patient   RxID:   [id number] LISINOPRIL-HYDROCHLOROTHIAZIDE 20-25 MG TABS (LISINOPRIL-HYDROCHLOROTHIAZIDE) One tab by mouth daily  #90 x 0   Entered and Authorized by:   Rodney Langton MD   Signed by:   Rodney Langton MD on 01/26/2010   Method used:   Print then Give to Patient   RxID:   [id number]    Orders Added: 1)  [facility]- Est  Level 4 [66440] 2)  Mammogram (Screening) [Mammo] 3)  Comp Met-[facility] [34742-59563] 4)  Direct LDL-[facility] [87564-33295] 5)  TSH-[facility] [18841-66063] 6)  Flu Vaccine 38yrs + [01601] 7)  Admin 1st Vaccine [09323]   Immunizations Administered:  Influenza Vaccine # 1:    Vaccine Type: Fluvax 3+    Site: left deltoid    Mfr: GlaxoSmithKline    Dose: 0.5 ml    Route: IM    Given by: Jimmy Footman, CMA    Exp. Date: 09/08/2010    Lot #: [id number]    VIS given: 10/03/09 version given January 26, 2010.  Flu Vaccine Consent Questions:    Do you have a  history of severe allergic reactions to this vaccine? no    Any prior history of allergic reactions to egg and/or gelatin? no    Do you have a sensitivity to the preservative Thimersol? no    Do you have a past history of Guillan-Barre Syndrome? no    Do you currently have an acute febrile illness? no    Have you ever had a severe reaction to latex? no    Vaccine information given and explained to patient? yes    Are you currently pregnant? no   Immunizations Administered:  Influenza Vaccine # 1:    Vaccine Type: Fluvax 3+    Site: left deltoid    Mfr: GlaxoSmithKline    Dose: 0.5 ml    Route: IM    Given by: Jimmy Footman, CMA    Exp. Date: 09/08/2010    Lot #: [id number]    VIS given: 10/03/09 version given January 26, 2010.           CMP ordered   CMP ordered

## 2010-04-23 ENCOUNTER — Emergency Department (HOSPITAL_COMMUNITY): Payer: No Typology Code available for payment source

## 2010-04-23 ENCOUNTER — Emergency Department (HOSPITAL_COMMUNITY)
Admission: EM | Admit: 2010-04-23 | Discharge: 2010-04-23 | Disposition: A | Discharge status: Home / Self Care | Payer: No Typology Code available for payment source | Attending: Emergency Medicine | Admitting: Emergency Medicine

## 2010-04-23 DIAGNOSIS — M542 Cervicalgia: Secondary | ICD-10-CM | POA: Insufficient documentation

## 2010-04-23 DIAGNOSIS — S139XXA Sprain of joints and ligaments of unspecified parts of neck, initial encounter: Secondary | ICD-10-CM | POA: Insufficient documentation

## 2010-04-23 DIAGNOSIS — M25519 Pain in unspecified shoulder: Secondary | ICD-10-CM | POA: Insufficient documentation

## 2010-04-23 DIAGNOSIS — Z79899 Other long term (current) drug therapy: Secondary | ICD-10-CM | POA: Insufficient documentation

## 2010-04-23 DIAGNOSIS — I10 Essential (primary) hypertension: Secondary | ICD-10-CM | POA: Insufficient documentation

## 2010-05-01 ENCOUNTER — Ambulatory Visit: Payer: Self-pay | Admitting: Family Medicine

## 2010-05-01 ENCOUNTER — Encounter: Payer: Self-pay | Admitting: Family Medicine

## 2010-05-01 ENCOUNTER — Other Ambulatory Visit: Payer: Self-pay | Admitting: Family Medicine

## 2010-05-01 ENCOUNTER — Ambulatory Visit (HOSPITAL_COMMUNITY)
Admission: RE | Admit: 2010-05-01 | Discharge: 2010-05-01 | Disposition: A | Discharge status: Home / Self Care | Payer: No Typology Code available for payment source | Source: Ambulatory Visit | Attending: Family Medicine | Admitting: Family Medicine

## 2010-05-01 DIAGNOSIS — M1710 Unilateral primary osteoarthritis, unspecified knee: Secondary | ICD-10-CM | POA: Insufficient documentation

## 2010-05-01 DIAGNOSIS — M179 Osteoarthritis of knee, unspecified: Secondary | ICD-10-CM

## 2010-05-01 DIAGNOSIS — T148XXA Other injury of unspecified body region, initial encounter: Secondary | ICD-10-CM

## 2010-05-01 DIAGNOSIS — IMO0002 Reserved for concepts with insufficient information to code with codable children: Secondary | ICD-10-CM | POA: Insufficient documentation

## 2010-05-01 DIAGNOSIS — S8000XA Contusion of unspecified knee, initial encounter: Secondary | ICD-10-CM | POA: Insufficient documentation

## 2010-05-01 DIAGNOSIS — M25569 Pain in unspecified knee: Secondary | ICD-10-CM

## 2010-05-01 DIAGNOSIS — E669 Obesity, unspecified: Secondary | ICD-10-CM

## 2010-05-01 HISTORY — DX: Osteoarthritis of knee, unspecified: M17.9

## 2010-05-01 HISTORY — DX: Unilateral primary osteoarthritis, unspecified knee: M17.10

## 2010-05-02 ENCOUNTER — Telehealth: Payer: Self-pay | Admitting: Family Medicine
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
## 2010-05-15 ENCOUNTER — Encounter: Payer: Self-pay | Admitting: Family Medicine

## 2010-05-15 ENCOUNTER — Ambulatory Visit: Payer: Self-pay | Admitting: Family Medicine

## 2010-05-15 DIAGNOSIS — S8000XA Contusion of unspecified knee, initial encounter: Secondary | ICD-10-CM

## 2010-05-16 ENCOUNTER — Other Ambulatory Visit: Payer: Self-pay | Admitting: Sports Medicine

## 2010-05-16 MED ORDER — QUINAPRIL-HYDROCHLOROTHIAZIDE 20-25 MG PO TABS: 1.0000 | ORAL_TABLET | Freq: Every day | ORAL | Status: DC

## 2010-05-16 MED ORDER — ESOMEPRAZOLE MAGNESIUM 40 MG PO CPDR: 40.0000 mg | DELAYED_RELEASE_CAPSULE | Freq: Every day | ORAL | Status: DC
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
## 2010-05-23 ENCOUNTER — Ambulatory Visit (INDEPENDENT_AMBULATORY_CARE_PROVIDER_SITE_OTHER): Payer: No Typology Code available for payment source | Admitting: Sports Medicine

## 2010-05-23 ENCOUNTER — Encounter: Payer: Self-pay | Admitting: Sports Medicine

## 2010-05-23 DIAGNOSIS — M25569 Pain in unspecified knee: Secondary | ICD-10-CM

## 2010-05-23 DIAGNOSIS — J45909 Unspecified asthma, uncomplicated: Secondary | ICD-10-CM

## 2010-05-23 DIAGNOSIS — I10 Essential (primary) hypertension: Secondary | ICD-10-CM

## 2010-05-23 MED ORDER — HYDROCODONE-ACETAMINOPHEN 5-500 MG PO TABS: 1.0000 | ORAL_TABLET | ORAL | Status: DC | PRN

## 2010-05-23 MED ORDER — MELOXICAM 15 MG PO TABS: 15.0000 mg | ORAL_TABLET | Freq: Every day | ORAL | Status: AC

## 2010-05-23 MED ORDER — FLUTICASONE-SALMETEROL 250-50 MCG/DOSE IN AEPB: 1.0000 | INHALATION_SPRAY | Freq: Two times a day (BID) | RESPIRATORY_TRACT | Status: DC

## 2010-05-23 NOTE — Assessment & Plan Note (Signed)
Pt originally on formoterol. Advised LABAs alone not ideal. [facility] has advair on formulary. Will rx this.

## 2010-05-23 NOTE — Patient Instructions (Signed)
Great to see you, See below exercises and med changes.  -Dr. Karie Schwalbe.  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 The kneecap (patella) is a v-shaped bone that usually glides through the center of the groove in the thigh bone (trochlea). For people with this condition, the kneecap presses more on the outer (lateral) side of the groove, causing an increase in pressure and pain in the knee. This condition usually occurs without injury, although it may also follow an injury.   SYMPTOMS  General, spread out knee pain, most commonly in the front half of the knee, behind the kneecap, or in the very back of the knee. Less commonly, the pain may be above or below the kneecap.  Pain that gets worse with sitting for long periods, rising from a sitting position, going up or down stairs or hills, kneeling, squatting, or wearing shoes with heels.  Often, pain with jumping.  Often, an aching pain.  Giving way, catching of the knee.  Minimal or no swelling, no locking.   CAUSES   Excessive lateral patellar compression syndrome is caused by weakness in the thigh (quadriceps) muscles, that causes the kneecap to track poorly within the knee joint. The poor tracking of the kneecap may also occur in people who have an improper alignment of the knee and leg (bowlegged, knock knees). The poor tracking of the kneecap results in an increase in pressure on the outer side of the knee. The membrane that holds the kneecap in place (retinaculum) is stretched, which causes pain. The pain gets worse with use of the thigh (quadriceps) muscle.   RISK INCREASES WITH    Tight back of the thigh (hamstring), front of the thigh (quadriceps), or calf muscles.  Weak front of the thigh (quadriceps) muscles.  Failure to warm up properly before activity.  Sports that  involve running, jumping, or squatting.  Poor alignment of the legs (bowlegged, knock knees).    Born with (congenital) malformation of the kneecap or thigh bone groove (trochlea).  Previous injury or surgery to the knee.  Direct injury to the kneecap (falling on the kneecap).   PREVENTIVE MEASURES    Warm up and stretch properly before activity.  Maintain physical fitness: l Strength, flexibility, and endurance. l Cardiovascular fitness.  Use arch supports (orthotics) or knee pads.   PROGNOSIS  If treated properly, excessive lateral patellar compression syndrome is usually curable. If left untreated, the condition does not usually result in a permanent condition.   POSSIBLE COMPLICATIONS    Frequently recurring symptoms and disability, severe enough to decrease an athlete's competitive ability.  Arthritis of the kneecap.  Kneecap dislocations.  Risks of surgery: infection, bleeding, injury to nerves (numbness, weakness, paralysis), knee stiffness, dislocation of the kneecap, weakness, continued pain, compartment syndrome (if surgery is performed to cut the bone of the leg and move it).   GENERAL TREATMENT CONSIDERATIONS   Treatment first involves ice and medicine, to reduce pain and inflammation. Since weak muscles often cause the condition, it is important to complete strengthening and stretching exercises to help the kneecap track properly in the thigh bone groove. These exercises may be performed at home or with a therapist. Your caregiver may recommend that you wear a knee brace, to help the kneecap track properly. For people with flat feet, orthotics may be advised. If non-surgical treatment is unsuccessful, surgery may be needed. Surgery  involves cutting the outer side (lateral release) of the connecting band (retinaculum), with or without tightening the retinaculum on the inner side of the knee. Sometimes, surgery to cut the bony bump below the kneecap (tibial tubercle) and  move it may be required (insertion of the patellar tendon into bone).     MEDICATION:    If pain medicine is needed, nonsteroidal anti-inflammatory medicines (aspirin and ibuprofen), or other minor pain relievers (acetaminophen), are often advised.    Do not take pain medicine for 7 days before surgery.    Prescription pain relievers may be given, if your caregiver thinks they are needed. Use only as directed and only as much as you need.  Corticosteroid injections are rarely used for this condition.   HEAT AND COLD:    Cold treatment (icing) should be applied for 10 to 15 minutes every 2 to 3 hours for inflammation and pain, and immediately after activity that aggravates your symptoms. Use ice packs or an ice massage.  Heat treatment may be used before performing stretching and strengthening activities prescribed by your caregiver, physical therapist, or athletic trainer. Use a heat pack or a warm water soak.     SEEK MEDICAL CARE IF:    Symptoms get worse or do not improve in 6 to 8 weeks, despite treatment.  Any of the following occur after surgery: l Pain, numbness, coldness, or discoloration (blue, gray, or dark color) in the foot. l Fever, increased pain, swelling, redness, drainage of fluids, or bleeding in the affected area.  New, unexplained symptoms develop. (Drugs used in treatment may produce side effects.)     EXERCISES    RANGE OF MOTION AND STRETCHING EXERCISES - Excessive Lateral Patellar Compression Syndrome These exercises may help you when beginning to rehabilitate your injury. Your symptoms may resolve with or without further involvement from your physician, physical therapist or athletic trainer.  While completing these exercises, remember:    Restoring tissue flexibility helps normal motion to return to the joints.  This allows healthier, less painful movement and activity.  An effective stretch should be held for at least 30 seconds.  A stretch should  never be painful. You should only feel a gentle lengthening or release in the stretched tissue.  Inform your caregiver of any exercises that increase your knee discomfort.    STRETCH - Lateral Patellar Mobilizations, Seated  While sitting, bend your knee 90 degrees or a little less. Place your foot flat on the floor.    Place the inside of your palm at the base of your thumb, on the inside border of your kneecap.  Press down on the inside border so that the outside border slightly lifts up.    You should feel a slight stretch on the outside edge of your kneecap. Hold this position for ____10_____ seconds. Repeat ____3______ times. Complete this stretch ____3______ times per day.      STRETCH - Hamstrings, Standing  Stand or sit and extend your ____R______ leg, placing your foot on a chair or foot stool.  Keep a slight arch in your low back and your hips straight forward.    Lead with your chest and lean forward at the waist until you feel a gentle stretch in the back of your _____R_____ knee or thigh. (When done correctly, this exercise requires leaning only a small distance.)  Hold this position for ____10______ seconds. Repeat _____3_____ times. Complete this stretch ____3______ times per day.     STRETCH - Quadriceps, Prone  Lie on your stomach on a firm surface, such as a bed or padded floor.  Bend your __________ knee and grasp your ankle. If you are unable to reach your ankle or pant leg, use a belt around your foot to lengthen your reach.  Gently pull your heel toward your buttocks.  Your knee should not slide out to the side. You should feel a stretch in the front of your thigh and knee.  Hold this position for ___10_______ seconds.   Repeat ____3______ times. Complete this stretch _____3__ times per day.       STRETCH - Iliotibial Band  On the floor or bed, lie on your side so your __________ leg is on top. Bend your knee and grab your ankle.    Slowly bring your  knee back so that your thigh is in line with your trunk. Keep your heel at your buttocks and gently arch your back so your head, shoulders and hips line up.    Slowly lower your leg so that your knee approaches the floor, until you feel a gentle stretch on the outside of your __________ thigh. If you do not feel a stretch and your knee will not fall farther, place the heel of your opposite foot on top of your knee and pull your thigh down farther.  Hold this stretch for __10____ seconds.   Repeat ___3_ times. Complete this stretch __3___ times per day.     STRENGTHENING EXERCISES - Excessive Lateral Patellar Compression Syndrome These exercises may help you when beginning to rehabilitate your injury.  They may resolve your symptoms with or without further involvement from your physician, physical therapist or athletic trainer.  While completing these exercises, remember:    Muscles can gain both the endurance and the strength needed for everyday activities through controlled exercises.  Complete these exercises as instructed by your physician, physical therapist or athletic trainer.  Increase the resistance and repetitions only as guided.  Only do your exercises in a pain-free range of motion. If the exercises that involve bending your knees while bearing weight cause pain, stop them and consult your caregiver.     STRENGTH - Quadriceps, Isometrics  Lie on your back with your __________ leg extended and your opposite knee bent.  Gradually tense the muscles in the front of your __________ thigh. You should see either your knee cap slide up toward your hip or increased dimpling just above the knee. This motion will push the back of the knee down toward the floor, mat, or bed on which you are lying.    Hold the muscle as tight as you can, without increasing your pain, for __10____ seconds.  Relax the muscles slowly and completely between each repetition. Repeat ___3___ times. Complete this  exercise __3_____ times per day.       STRENGTH - Quadriceps, Short Arcs   Lie on your back.  Place a __________ inch towel roll under your __________ knee, so that the knee bends slightly.  Raise only your lower leg, by tightening the muscles in the front of your thigh. Do not allow your thigh to rise.  Hold this position for ___10__ seconds. Repeat ____3____ times. Complete this exercise ___3_____ times per day.       OPTIONAL ANKLE WEIGHTS:  Begin with ___2.5____ pounds, but DO NOT exceed ______5__________ pounds.  Increase in 1 pound increments.     STRENGTH - Quadriceps, Straight Leg Raises  Quality counts! Watch for signs that the quadriceps muscle is  working, to be sure you are strengthening the correct muscles and not "cheating" by substituting with healthier muscles.    Lay on your back with your __________ leg extended and your opposite knee bent.    Tense the muscles in the front of your __________ thigh. You should see either your knee cap slide up or increased dimpling just above the knee. Your thigh may even shake a bit.  Tighten these muscles even more and raise your leg 4 to 6 inches off the floor. Hold for _____10___ seconds.  Keeping these muscles tense, lower your leg.  Relax the muscles slowly and completely in between each repetition. Repeat ____3__ times. Complete this exercise _____3___ times per day.      STRENGTH - Quadriceps, Wall Slides   Follow guidelines for form closely.  Increased knee pain often results from poorly placed feet or knees.    Lean against a smooth wall or door, and walk your feet out 18-24 inches.  Place your feet hip width apart.  Slowly slide down the wall or door until your knees bend __________ degrees.*  Keep your knees over your heels, not your toes, and in line with your hips, not falling to either side.  Hold for ___10____ seconds. Stand up to rest for ____10__ seconds between each repetition.   Repeat _____3___ times.  Complete this exercise ___3_____ times per day.   * Your physician, physical therapist or athletic trainer will alter this angle based on your symptoms and progress.    STRENGTH - Quadriceps, Squats  Stand in a door frame, so that your feet and knees are in line with the frame.  Use your hands for balance, not support, on the frame.  Slowly lower your weight, bending at the hips and knees. Keep your lower legs upright so that they are parallel with the door frame. Squat only within the range that does not increase your knee pain. Never let your hips drop below your knees.    Slowly return upright, pushing with your legs, not pulling with your hands. Repeat __10_____ times. Complete this exercise ___3____ times per day.      STRENGTH - Quadriceps, Step-Ups   Use a thick book, step or step stool that is __________ inches tall.  Hold a wall or counter for balance only, not support.  Slowly, step up with your __________ foot, keeping your knee in line with your hip and foot. Do not allow your knee to bend so far that you cannot see your toes.  Slowly unlock your knee and lower yourself to the starting position.  Your muscles, not gravity, should lower you. Repeat ____10___ times. Complete this exercise ______3_ times per day.       STRENGTH - Quadriceps, Step-Downs  Stand on the edge of a step stool or stair. Be prepared to use a countertop or wall for balance, if needed.  Keeping your __________ knee directly over the middle of your foot, slowly touch your opposite heel to the floor or lower step. Do not go all the way to the floor if your knee pain increases. Just go as far as you can without increased discomfort. Use your __________ leg muscles, not gravity, to lower your body weight.    Slowly push your body weight back up to the starting position, Repeat ____10__ times.  Complete this exercise _____3__ times per day.       STRENGTH - Quad/VMO, Isometric    Sit in a chair  with your __________ knee  slightly bent. With your fingertips, feel the VMO muscle, just above the inside of your knee. The VMO is important in controlling the position of your kneecap.    Keep your fingertips on this muscle. Without actually moving your leg, attempt to drive your knee down, as if straightening your leg. You should feel your VMO tense.  If you have a difficult time, you may wish to try the same exercise on your healthy knee first.    Tense this muscle as hard as you can without increasing any knee pain.    Hold for _10_____ seconds. Relax the muscles slowly and completely between each repetition. Repeat ____3___ times. Complete exercise ___3____ times per day.     Document Released: 02/25/2005  Document Re-Released: 03/19/2009 [facility] Patient Information 2011 [place], [place].

## 2010-05-23 NOTE — Assessment & Plan Note (Signed)
Well controlled, no changes 

## 2010-05-23 NOTE — Assessment & Plan Note (Addendum)
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 Increasing mobic to 15mg  daily. PFPS rehab exercises. She is going to formal PT. Can try steroid inj if no better after the above.

## 2010-05-23 NOTE — Progress Notes (Signed)
  Subjective:    Patient ID: Catherine Powell, female    DOB: June 15, 1965, 45 y.o.   MRN: [id number]  HPI K knee pain:  Has been to [facility], dx knee contusion, rx'ed mobic that helps some.  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  Sunrise view with lateral patellar tilt and spurring along medial femoral condyle.  Knee pain is medial joint line.  Occasional swelling. No popping/catching/locking.  Worse with getting up from chair and going up stairs.   Asthma:  Was on a LABA alone. Hasn't been taking this but notes her breathing and albuterol use was better while taking it.  Review of Systems    See HPI Objective:   Physical Exam  Constitutional: She appears well-developed and well-nourished.  Cardiovascular: Normal rate, regular rhythm and normal heart sounds.  Exam reveals no gallop and no friction rub.   No murmur heard. Pulmonary/Chest: Effort normal and breath sounds normal.  Musculoskeletal:       R Knee: Normal to inspection with no erythema or effusion or obvious bony abnormalities.  Difficult exam due to habitus Palpation with no warmth, minimal some medial joint line tenderness, no patellar tenderness, and no condyle tenderness. ROM full in flexion and extension and lower leg rotation. Ligaments with solid consistent endpoints including ACL, PCL, LCL, MCL. Negative Mcmurray's, Apley's, and Thessalonian tests. Painful patellar compression. Patellar glide with minimal crepitus. Patellar and quadriceps tendons unremarkable. Hamstring and quadriceps strength is normal but minimal VMO definition.             Assessment & Plan:

## 2010-05-28 ENCOUNTER — Ambulatory Visit: Payer: No Typology Code available for payment source | Admitting: Physical Therapy

## 2010-05-29 ENCOUNTER — Ambulatory Visit: Payer: No Typology Code available for payment source | Admitting: Family Medicine

## 2010-05-31 ENCOUNTER — Ambulatory Visit
Payer: No Typology Code available for payment source | Attending: Sports Medicine | Admitting: Rehabilitative and Restorative Service Providers"

## 2010-05-31 DIAGNOSIS — M25569 Pain in unspecified knee: Secondary | ICD-10-CM | POA: Insufficient documentation

## 2010-05-31 DIAGNOSIS — M25669 Stiffness of unspecified knee, not elsewhere classified: Secondary | ICD-10-CM | POA: Insufficient documentation

## 2010-05-31 DIAGNOSIS — IMO0001 Reserved for inherently not codable concepts without codable children: Secondary | ICD-10-CM | POA: Insufficient documentation

## 2010-06-05 ENCOUNTER — Ambulatory Visit
Payer: No Typology Code available for payment source | Attending: Sports Medicine | Admitting: Rehabilitative and Restorative Service Providers"

## 2010-06-05 DIAGNOSIS — M25569 Pain in unspecified knee: Secondary | ICD-10-CM | POA: Insufficient documentation

## 2010-06-05 DIAGNOSIS — IMO0001 Reserved for inherently not codable concepts without codable children: Secondary | ICD-10-CM | POA: Insufficient documentation

## 2010-06-05 DIAGNOSIS — M25669 Stiffness of unspecified knee, not elsewhere classified: Secondary | ICD-10-CM | POA: Insufficient documentation

## 2010-06-07 ENCOUNTER — Ambulatory Visit: Payer: No Typology Code available for payment source | Admitting: Rehabilitative and Restorative Service Providers"

## 2010-06-12 ENCOUNTER — Encounter: Payer: Self-pay | Admitting: Family Medicine

## 2010-06-12 ENCOUNTER — Ambulatory Visit
Payer: No Typology Code available for payment source | Attending: Sports Medicine | Admitting: Rehabilitative and Restorative Service Providers"

## 2010-06-12 ENCOUNTER — Telehealth: Payer: Self-pay | Admitting: Sports Medicine

## 2010-06-12 ENCOUNTER — Ambulatory Visit (INDEPENDENT_AMBULATORY_CARE_PROVIDER_SITE_OTHER): Payer: Self-pay | Admitting: Family Medicine

## 2010-06-12 DIAGNOSIS — M25669 Stiffness of unspecified knee, not elsewhere classified: Secondary | ICD-10-CM | POA: Insufficient documentation

## 2010-06-12 DIAGNOSIS — M25569 Pain in unspecified knee: Secondary | ICD-10-CM

## 2010-06-12 DIAGNOSIS — IMO0001 Reserved for inherently not codable concepts without codable children: Secondary | ICD-10-CM | POA: Insufficient documentation

## 2010-06-12 DIAGNOSIS — S8000XA Contusion of unspecified knee, initial encounter: Secondary | ICD-10-CM
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
## 2010-06-12 NOTE — Assessment & Plan Note (Signed)
Over 50-60% improved today Continue physical therapy as she is doing. Advance activity as tolerated. Continue Mobic as needed. Followup with [place] on an as-needed basis.

## 2010-06-12 NOTE — Telephone Encounter (Signed)
Needs refill on Vicodin - pls call when ready

## 2010-06-13 ENCOUNTER — Other Ambulatory Visit: Payer: Self-pay | Admitting: Family Medicine

## 2010-06-13 ENCOUNTER — Ambulatory Visit: Payer: No Typology Code available for payment source

## 2010-06-13 MED ORDER — HYDROCODONE-ACETAMINOPHEN 5-500 MG PO TABS: 1.0000 | ORAL_TABLET | ORAL | Status: DC | PRN

## 2010-06-13 NOTE — Progress Notes (Signed)
Vicodin Rx refilled for patient today in Dr. Leonia Corona absence.  Pt to f/u with Dr. Karie Schwalbe as scheduled later this month.

## 2010-06-14 NOTE — Telephone Encounter (Signed)
Looks like Dr. Christella Hartigan did this already on 06/13/10.

## 2010-06-19 ENCOUNTER — Ambulatory Visit: Payer: No Typology Code available for payment source | Admitting: Rehabilitative and Restorative Service Providers"

## 2010-06-21 ENCOUNTER — Encounter: Payer: No Typology Code available for payment source | Admitting: Physical Therapy

## 2010-06-25 ENCOUNTER — Encounter: Payer: Self-pay | Admitting: Sports Medicine

## 2010-06-25 ENCOUNTER — Ambulatory Visit (INDEPENDENT_AMBULATORY_CARE_PROVIDER_SITE_OTHER): Payer: No Typology Code available for payment source | Admitting: Sports Medicine

## 2010-06-25 DIAGNOSIS — M25569 Pain in unspecified knee: Secondary | ICD-10-CM

## 2010-06-25 DIAGNOSIS — J45909 Unspecified asthma, uncomplicated: Secondary | ICD-10-CM

## 2010-06-25 MED ORDER — MOMETASONE FUROATE 220 MCG/INH IN AEPB: 1.0000 | INHALATION_SPRAY | Freq: Two times a day (BID) | RESPIRATORY_TRACT | Status: DC

## 2010-06-25 MED ORDER — OLOPATADINE HCL 0.1 % OP SOLN: 1.0000 [drp] | Freq: Two times a day (BID) | OPHTHALMIC | Status: DC

## 2010-06-25 NOTE — Assessment & Plan Note (Signed)
[facility] does not carry advair anymore. Will switch to asmanex.

## 2010-06-25 NOTE — Assessment & Plan Note (Signed)
Injected today. Hopefully she will feel well enough to cross the stage during her graduation. Cont mobic. RTC prn, cont rehab exercises.

## 2010-06-25 NOTE — Progress Notes (Signed)
  Subjective:    Patient ID: Catherine Powell, female    DOB: Jun 05, 1965, 45 y.o.   MRN: [id number]  HPI R PFPS:  Catherine Powell to [facility], doing appropriate exercises, taking mobic, pain 50% better but graduating from college in 3 weeks and still doesn't feel comfortable walking down the stage with her pain, wondering if we could try injection.  Asthma:  Currently not on any controller medication, [facility] did not have advair.   Review of Systems    See HPI Objective:   Physical Exam     R Knee: Normal to inspection with no erythema or effusion or obvious bony abnormalities.  Difficult exam due to habitus Palpation with no warmth, minimal some medial joint line tenderness, no patellar tenderness, and no condyle tenderness. ROM full in flexion and extension and lower leg rotation. Ligaments with solid consistent endpoints including ACL, PCL, LCL, MCL. Negative Mcmurray's, Apley's, and Thessalonian tests. Painful patellar compression. Patellar glide with minimal crepitus. Patellar and quadriceps tendons unremarkable. Hamstring and quadriceps strength is normal but minimal VMO definition.   Consent obtained and verified. Time-out conducted. Noted no overlying erythema, induration, or other signs of local infection. Sterile betadine prep. Furthur cleansed with alcohol. Topical analgesic spray: Ethyl chloride. Joint: R knee Approached in typical fashion with: 25g needle. Completed without difficulty Meds: 1cc kenalog 40, 4 cc licodaine 1% Advised to call if fevers/chills, erythema, induration, drainage, or persistent bleeding.    Assessment & Plan:

## 2010-06-25 NOTE — Patient Instructions (Signed)
Great to see you, Injected knee. Come back to see me as needed. I will be in touch with the [facility] dept pharmacy regarding your respiratory medications.  [name]. [name], M.D.

## 2010-06-26 ENCOUNTER — Ambulatory Visit: Payer: No Typology Code available for payment source | Admitting: Rehabilitative and Restorative Service Providers"

## 2010-06-28 ENCOUNTER — Ambulatory Visit: Payer: No Typology Code available for payment source | Admitting: Rehabilitative and Restorative Service Providers"

## 2010-07-03 ENCOUNTER — Telehealth: Payer: Self-pay | Admitting: Family Medicine

## 2010-07-03 ENCOUNTER — Encounter: Payer: No Typology Code available for payment source | Admitting: Rehabilitative and Restorative Service Providers"

## 2010-07-03 NOTE — Telephone Encounter (Signed)
Received Rx from [facility] regarding Advair for pt.  [facility] received Rx for Asmanex, but pt states that Dr T wanted her to try Advair.  Looking at Dr Melvia Heaps note from 05/23/10 visit, he did want her to try Advair again (at one time pt reported ear pain when taking it).  Gave verbal to have pt try Advair again, in place of asmanex.  If pt tolerates, they can cont her Rx per Dr T.

## 2010-07-12 ENCOUNTER — Ambulatory Visit: Payer: No Typology Code available for payment source | Admitting: Rehabilitative and Restorative Service Providers"

## 2010-07-23 ENCOUNTER — Other Ambulatory Visit: Payer: Self-pay | Admitting: Sports Medicine

## 2010-07-23 MED ORDER — HYDROCODONE-ACETAMINOPHEN 5-500 MG PO TABS: 1.0000 | ORAL_TABLET | ORAL | Status: DC | PRN

## 2010-07-24 NOTE — Assessment & Plan Note (Signed)
[facility]                            CARDIOLOGY OFFICE NOTE   NAME:Catherine Powell, SPACE             MRN:          [id number]  DATE:12/31/2006                            DOB:          [birth year]    PRIMARY CARE PHYSICIAN:  [facility].   REASON FOR PRESENTATION:  The patient has chest pain and hypertension.   HISTORY OF PRESENT ILLNESS:  The patient returns for follow-up.  At the  last visit we discussed chest discomfort.  I sent her for a stress  profusion study which demonstrated EF of 68% with no evidence of  ischemia or infarct.  I also increased her lisinopril from 10 to 20 mg.  She has since seen her primary care doctor and has had  hydrochlorothiazide added.  Her blood pressures are better controlled.  She has also started the [facility] Diet.  Unfortunately, she is still  getting the chest discomfort as described in the previous note.   PAST MEDICAL HISTORY:  1. Rheumatic fever at the age of 4.  2. Exogenous obesity.  3. Migraines.  4. C-section, 1991.  5. Heel cord release in 1986.  6. Appendectomy in 1981.  7. Knee surgery on the left in 1994 and right in 1992.   REVIEW OF SYSTEMS:  As stated in the HPI and otherwise negative for  other systems.   PHYSICAL EXAMINATION:  The patient is in no distress.  Blood pressure 136/86, heart rate 76 and regular.  LUNGS:  Clear to auscultation bilaterally.  HEART:  PMI not displaced or sustained.  S1/S2 within normal limits.  No  S3, no S4, no murmurs.  ABDOMEN:  Obese.  Positive bowel sounds.  Normal in frequency and pitch.  No bruits, no rebound.  EXTREMITIES:  2+ pulses.  No edema.   ASSESSMENT/PLAN:  1. Chest discomfort.  The patient's chest discomfort does not have an      obvious cardiac etiology.  No further cardiovascular testing is      suggested.  2. Hypertension.  Blood pressure seems to be better controlled and she      will continue with medical management  and follow-up by her primary      care doctor.  3. Obesity.  I am proud of her for starting the [facility] Diet and      she will continue with this.  4. Follow-up.  See the patient again as needed.     Rollene Rotunda, MD, [facility]  Electronically Signed    JH/MedQ  DD: 12/31/2006  DT: 12/31/2006  Job #: [id number]   cc:   [facility]

## 2010-07-24 NOTE — Op Note (Signed)
NAMENAVEAH, BRAVE      ACCOUNT NO.:  [id number]   MEDICAL RECORD NO.:  [id number]          PATIENT TYPE:  AMB   LOCATION:  SDS                          FACILITY:  [facility]   PHYSICIAN:  Hewitt Shorts, M.D.DATE OF BIRTH:  11-May-1965   DATE OF PROCEDURE:  10/14/2007  DATE OF DISCHARGE:                               OPERATIVE REPORT   PREOPERATIVE DIAGNOSES:  1. C5-C6, C6-C7 cervical disk herniation.  2. Cervical spondylosis.  3. Cervical degenerative disk disease.  4. Cervical  radiculopathy.   POSTOPERATIVE DIAGNOSES:  1. C5-C6, C6-C7  cervical disk herniation.  2. Cervical spondylosis.  3. Cervical degenerative disk disease.  4. Cervical  radiculopathy.   PROCEDURES:  C5-C6 and C6-C7 anterior cervical discectomy and  arthrodesis with allograft and tethered cervical plating.   SURGEON:  Hewitt Shorts, MD   ASSISTANT:  Hilda Lias, MD   ANESTHESIA:  General endotracheal.   INDICATIONS FOR PROCEDURE:  The patient is a 45 year old woman who  presented with a left cervical radiculopathy.  MRI revealed multi-level  degeneration disk disease and spondylosis with superimposed disk  herniations to the left and a decision was made to proceed with two-  level anterior cervical decompression and arthrodesis.   PROCEDURE:  The patient was brought to the operating room and placed  under general endotracheal anesthesia.  The patient was placed in 10  pounds of Holter traction and the neck was prepped with Betadine soaked  solution and draped in a sterile fashion.  A horizontal incision was  made in the left side of the neck.  The line of the incision was  infiltrated with local anesthetic with epinephrine.  An incision was  made and carried down through the subcutaneous tissue and platysma.  Bipolar electrocautery was used to maintain hemostasis.  Dissection was  carried out through the avascular plane between the sternocleidomastoid,  carotid artery, and jugular  vein laterally, and the trachea and  esophagus medially.  The ventral aspect of the vertebral column was  identified and localizing x-rays were taken of the C5-C6 and the C6-C7  intervertebral disk space identified.  Diskectomy was begun with  incision of the annulus continued with microcurettes and pituitary  rongeurs.  At each level, anterior osteophytic overgrowth was removed  using the osteophyte removal tool and the X-Max drill.  The microscope  was draped and brought into the field to provide additional navigation,  illumination, and visualization.  The remainder of the decompression was  performed using microdissection and microsurgical technique.  Diskectomy  was continued.  Then, we began to remove the cauterized endplates using  microcurettes along with the X-Max drill and then posterior osteophytic  overgrowth was removed using the X-Max drill along with a 2-mm Kerrison  punch with a central footplate.  The posterior longitudinal ligament was  carefully removed and the dissection was carried out laterally to either  side at each level.  All the hernia and disk material was removed and we  were able to decompress the spinal canal, thecal sac, and the neural  foramina at exiting nerve roots bilaterally.  Once the decompression was  complete, hemostasis was established with  use of Gelfoam soaked in  thrombin.  we measured the height of the intervertebral disk spaces and  selected two 7-mm implants.  They were each hydrated in saline solution  and then we carefully positioned the graft in the intervertebral disk  space and they were countersunk.  We then selected a 35-mm Tether  cervical plate that was positioned over the fusion construct.  It was  secured to the vertebra with 4 x 13 mm variable-angle screws placing a  pair of screws at C5 and other pair at C7 and a single screw at C6.  Each screw was drilled and the screw was placed in the alternating  fashion.  Once all 5 screws  were placed, final tightening was performed  and then the wound was irrigated with Bacitracin solution and checked  for hemostasis, which was confirmed and x-rays taken.  We were only able  to visualize the C5 level.  The screws were in good position.  The  shoulders obscured the visualization and the remainder of the lower  portion of the cervical spine.  Hemostasis were again confirmed.  Then,  we proceeded with the closure.  The platysma was closed with interrupted  inverted 2-0 undyed Vicryl sutures. The subcutaneous and subcuticular  were closed with interrupted inverted 3-0 undyed Vicryl sutures.  The  skin was re-approximated with Dermabond.  The procedure was tolerated  well. The estimated blood loss was 75 mL. Sponge and needle count were  correct. Following surgery, the patient was taken out of traction, to be  reversed in the anesthetic, extubated, and transferred to the recovery  room for further care.      Hewitt Shorts, M.D.  Electronically Signed     RWN/MEDQ  D:  10/14/2007  T:  10/15/2007  Job:  [id number]

## 2010-07-24 NOTE — Discharge Summary (Signed)
Catherine Powell, Catherine Powell      ACCOUNT NO.:  [id number]   MEDICAL RECORD NO.:  [id number]          PATIENT TYPE:  INP   LOCATION:  2015                         FACILITY:  [facility]   PHYSICIAN:  Learta Codding, MD,FACC DATE OF BIRTH:  [birth year]   DATE OF ADMISSION:  07/10/2007  DATE OF DISCHARGE:                         DISCHARGE SUMMARY - REFERRING   DIAGNOSES:  1. Chest discomfort of uncertain etiology.  2. Hypertension.  3. Obesity.   PROCEDURE PERFORMED:  Cardiac catheterization on Jul 10, 2007 by Dr.  Eden Emms.   SUMMARY OF HISTORY:  Catherine Powell is a 45 year old female who  returned to Dr. Jenene Slicker clinic secondary to recurring chest  discomfort.  Initial evaluation had a negative stress test and stress  Myoview, with a hypertensive response in the fall, with an EF of 68%.  However, the patient continued to have chest discomfort which she felt  was increasing in intensity and frequency and happening daily.  On the  day prior to admission she had an episode that last 45 minutes,  radiating to the left arm, resolving spontaneously.  There was not any  associated shortness of breath, diaphoresis, palpitations, or  presyncope.Marland Kitchen   Her history is notable for migraine headaches hypertension, rheumatic  fever at age 62, obesity, and TOLECTIN allergy.   LABORATORY DATA:  EKG showed normal sinus rhythm, normal axis, slightly  delayed R-wave, otherwise unremarkable.  Admission weight was 155 kg.  Preadmission H&H was 12.8 and 39.4, normal indices, platelets 332, WBCs  8.5.  PTT 32.2, PT 13.4.  Sodium 138, potassium 4.2 be BUN 8, creatinine  0.7, glucose 105.  CK-MBs, relative indexes, and troponins were within  normal limits x2.   HOSPITAL COURSE:  The patient was brought into the [facility] lab for cardiac  catheterization.  This was performed by Dr. Eden Emms and did not show any  significant coronary artery disease.  EF 65%.  Dr. Andee Lineman after review  felt that she could have a  Prinzmetal's angina versus coronary spasm  versus syndrome X.  He started her on a low dose of Imdur as well as  continued sublingual nitroglycerin p.r.n., and follow up with Dr.  Antoine Poche in 4-6 weeks.  He also advised her not to return to work for 1  week, and to consider weight loss therapy.   DISPOSITION:  The patient was asked to maintain low-sodium, heart-  healthy diet.  She was given permission to return to work on Jul 20, 2007.  Wound care and activities are per supplemental discharge sheet.  She received a new prescription for Imdur 30 mg daily, nitroglycerin  0.4, and asked to continue her following medications:  Pulmicort 2 puffs  b.i.d., ProAir 2 puffs as needed, verapamil 120 t.i.d., Coreg 12.5  b.i.d., Nasacort daily, lisinopril/HCTZ 20/12.5, 2 tablets daily, B2 as  previously, potassium, magnesium, and Patanol as previously.  She  was asked to bring all medications to all appointments, and begin a  blood pressure diary and bring this to all appointments as well.  Our  office will call her with a 4-6 week appointment with Dr. Antoine Poche.   DISCHARGE TIME:  35 minutes.  Joellyn Rued, PA-C      Learta Codding, MD,FACC  Electronically Signed    EW/MEDQ  D:  07/11/2007  T:  07/11/2007  Job:  [id number]   cc:   Angeline Slim, M.D.  Rollene Rotunda, MD, [facility]

## 2010-07-24 NOTE — Assessment & Plan Note (Signed)
[facility]                            CARDIOLOGY OFFICE NOTE   OMOLOLA, MITTMAN                    MRN:          [id number]  DATE:12/08/2006                            DOB:          [birth year]    REFERRING PHYSICIAN:  Devoria Albe, M.D.   PRIMARY CARE PHYSICIAN:  [facility].   REASON FOR PRESENTATION:  Evaluate patient with chest pain and  hypertension.   HISTORY OF PRESENT ILLNESS:  The patient is a pleasant 45 year old white  female.  We saw her about 6 years ago.  I do not have these records.  She had chest discomfort at that time that we said was nonanginal.  She  had no further cardiovascular testing although there probably was an  echocardiogram.  She says that she did well and these symptoms abated  after a few months back then.  However, over the past 1.5 years she has  had increasing chest discomfort.  This is increasing in frequency and  intensity.  It is a substernal discomfort.  It is heavy.  There is no  radiation.  It is 6/10 in intensity.  It happens very often when she has  her migraine headaches.  She is getting it 3-4 times a week.  It is the  same kind of discomfort she had 6 years ago.  It lasts for about an hour  before going away on its own.  She will also get the same kind of  discomfort without the migraines if she tries to exert herself such as  with walking.  She has been trying to walk to loose weight.  She is not  having any shortness of breath, denies any PND or orthopnea.  She does  have some mild palpitations, but she is not having any presyncope or  syncope.   PAST MEDICAL HISTORY:  1. Migraine headaches.  2. Hypertension.   PAST SURGICAL HISTORY:  1. C-section.  2. Appendectomy.  3. Broken right ankle repaired.   ALLERGIES:  LECTIN DS.   MEDICATIONS:  1. Lisinopril 10 mg daily.  2. Verapamil 360 mg daily.   SOCIAL HISTORY:  The patient works for [employer].  She is a  Customer service manager.  She is married and has 1 child.  She does not smoke  cigarettes and does not drink alcohol.   FAMILY HISTORY:  Noncontributory for early coronary disease though her  father had heart disease at later onset.   REVIEW OF SYSTEMS:  As stated in the HPI, positive for asthma history,  reflux, joint pains.  Negative for all other systems.   PHYSICAL EXAMINATION:  GENERAL:  The patient is in no distress.  VITAL SIGNS:  Blood pressure 158/97, heart rate 90 regular, and weight  345 pounds, body mass index 52.  HEENT:  Eyelids unremarkable.  Pupils are equal, round, and reactive to  light.  Fundi not visualized.  Oral mucosa unremarkable.  NECK:  No jugular venous distention at 45 degrees.  Carotid upstroke  brisk and symmetric.  No bruits.  No thyromegaly.  LYMPHATICS:  No  cervical, axillary, or  inguinal adenopathy.  LUNGS:  Clear to auscultation bilaterally.  BACK:  No costovertebral angle tenderness.  CHEST:  Unremarkable.  HEART:  PMI not displaced or sustained.  S1 and S2 within normal limits.  No S3, no S4, no clicks, no rubs, no murmurs.  ABDOMEN:  Obese.  Positive bowel sounds, normal in frequency and pitch.  No bruits.  No rebound.  No guarding.  No midline pulsatile mass.  No  organomegaly.  SKIN:  No rashes.  No nodules.  EXTREMITIES:  Two plus pulses throughout.  No cyanosis, no clubbing, no  edema.  NEUROLOGIC:  Oriented to person, place and time.  Cranial nerves II-XII  grossly intact.  Motor grossly intact.   EKG (done at [facility]) sinus rhythm, rate 58, axis within normal  limits, intervals within normal limits.  No acute ST-T wave changes.   ASSESSMENT/PLAN:  1. Chest discomfort.  The patient's chest discomfort has some typical      and some atypical features.  Given that this is recurrent and      progressive, I am going to screen her with an exercise perfusion      study.  Further evaluation will be based on these results.  2. Hypertension.  Blood  pressure is slightly elevated.  She had a      hypertensive urgency leading in part to her emergency room visit in      September.  I am going to take the liberty of increasing her      Lisinopril to 20 mg daily.  She can get a B-MET when she comes back      for her stress test.  3. Obesity.  We discussed the [facility] Diet.   FOLLOWUP:  Will be in about 4 weeks after the stress perfusion study.     Rollene Rotunda, MD, [facility]  Electronically Signed    JH/MedQ  DD: 12/08/2006  DT: 12/08/2006  Job #: [id number]   cc:   Devoria Albe, M.D.

## 2010-07-24 NOTE — H&P (Signed)
NAMESHALITA, [facility]      ACCOUNT NO.:  [id number]   MEDICAL RECORD NO.:  [id number]          PATIENT TYPE:  INP   LOCATION:  1824                         FACILITY:  [facility]   PHYSICIAN:  Ollen Gross. Vernell Morgans, M.D. DATE OF BIRTH:  [birth year]   DATE OF ADMISSION:  03/29/2007  DATE OF DISCHARGE:                              HISTORY & PHYSICAL   HISTORY:  Ms. Caydee Talkington is a 45 year old white female who initially  developed upper abdominal pain on Friday.  The pain has been severe in  nature.  The pain has been associated with some significant nausea and  vomiting.  It does not appear to radiate anywhere.  She came to the  emergency department for evaluation.  Her pain has improved slightly  since arriving here.  She has had a little bit of chest pain with this.  No diaphoresis, no shortness of breath, no diarrhea or dysuria.   REVIEW OF SYSTEMS:  Rest of her review of systems are unremarkable.   PAST MEDICAL HISTORY:  Significant for hypertension, carpal tunnel  syndrome, migraines, morbid obesity, rheumatic fever.   PAST SURGICAL HISTORY:  Significant for appendectomy, hemorrhoid surgery  and right ankle surgery.   MEDICATIONS INCLUDE:  1. Hydrochlorothiazide.  2. Midrin.  3. Vanceril.  4. Vicodin.   ALLERGIES:  Are to TOLECTIN DS, SULFA.   SOCIAL HISTORY:  She denies use of tobacco products.   FAMILY HISTORY:  Is noncontributory.   PHYSICAL EXAM:  VITAL SIGNS:  Temperature is 96.9, blood pressure  144/76, pulse 78.  GENERAL:  She is an obese white female in no acute distress.  SKIN:  Warm and dry without jaundice.  Her extraocular muscles are  intact.  Pupils equal, round and reactive to light.  Sclerae nonicteric.  LUNGS:  Clear bilaterally with no use of accessory respiratory muscles.  HEART:  Regular rate and rhythm with impulse in left chest.  ABDOMEN:  Soft with mild to moderate epigastric right upper quadrant  tenderness but no guarding or peritonitis.   Difficult to palpate any  mass due to her size.  EXTREMITIES:  No clubbing, cyanosis or edema.  Good strength in her arms  and legs.  PSYCHIATRIC:  She is alert and oriented x3 with no anxiety or  depression.   On review of her lab work it was significant for normal liver functions.  CT scan was performed and showed a large stone in her gallbladder with a  thickened gallbladder wall.   ASSESSMENT AND PLAN:  This is a 45 year old female with significant  cholecystitis with cholelithiasis.  Because of the risk of further  painful episodes, sepsis and pancreatitis, I think she would benefit  from having her gallbladder removed.  She would also like to have this  done.  I have explained to her in detail the risks and benefits of the  operation to remove the gallbladder as well as some of the technical  aspects and she understands and wishes to proceed.  Will obtain some  routine preoperative lab work in preparation for doing this for her this  afternoon.      Ollen Gross. Vernell Morgans,  M.D.  Electronically Signed     PST/MEDQ  D:  03/29/2007  T:  03/29/2007  Job:  [id number]

## 2010-07-24 NOTE — Assessment & Plan Note (Signed)
[facility]                            CARDIOLOGY OFFICE NOTE   NAME:C[facility], Catherine Powell             MRN:          [id number]  DATE:07/08/2007                            DOB:          [birth year]    PRIMARY CARE PHYSICIAN:  Dr. Angeline Slim, [facility].   REASON FOR PRESENTATION:  Evaluate a patient with chest pain.   HISTORY OF PRESENT ILLNESS:  The patient brought herself back to the  clinic for evaluation of chest discomfort.  I saw her last fall.  At  that time she had chest discomfort and we sent her for a stress  perfusion study.  This demonstrated a hypertensive response.  The EF was  68%.  There did not appear to be any ischemia or infarct, though there  was some matched decreased perfusion in the inferior wall thought  questionably diaphragmatic.   Unfortunately, the patient continues to have chest discomfort.  This is  increasing in intensity and frequency.  It is happening daily.  She had  a severe episode yesterday lasting about 45 minutes.  She describes it  as substernal and left upper chest discomfort.  There has been some  radiation down the left arm.  It goes away spontaneously.  It comes on  spontaneously.  It can be 8/10 in frequency.  She does not notice any  new shortness of breath with it and does not have any new diaphoresis.  She does not have any palpitations, presyncope or syncope.  This is,  again, the same discomfort she has had since the fall but worsening in  frequency and in the level of pain.  Yesterday she did take some aspirin  when she had this pain.  She does do some walking around the block 3 or  4 times a week and is not able to bring on the discomfort with this.   PAST MEDICAL HISTORY:  Migraine headaches, hypertension, history of  rheumatic fever at age 45, obesity.   PAST SURGICAL HISTORY:  C-section, appendectomy, broken right ankle  repaired, cholecystectomy in January.   ALLERGIES:  TOLECTIN.   MEDICATIONS:  Verapamil 120 mg t.i.d., Patanol, potassium, Nasacort,  carvedilol 20 mg b.i.d., lisinopril HCT 20/12.5 daily.   SOCIAL HISTORY:  The patient works for [employer].  She is a  Pharmacist, community.  She is married and has 1 child.  She does not smoke  cigarettes or drink alcohol.   FAMILY HISTORY:  Noncontributory for early coronary disease but her  father had heart disease at a later onset.   REVIEW OF SYSTEMS:  As stated in the HPI and otherwise negative for  other systems.   PHYSICAL EXAMINATION:  The patient is in no distress.  Blood pressure  141/88, heart rate 83 and regular, weight 346 pounds, body mass index  52.  HEENT:  Eyelids unremarkable.  Pupils equal, round, and reactive to  light.  Fundi not visualized.  Oral mucosa unremarkable.  NECK:  No jugular venous distention at 45 degrees.  Carotid upstrokes  brisk and symmetrical.  No bruits, no thyromegaly.  LYMPHATICS:  No cervical, axillary, or inguinal  adenopathy.  LUNGS:  Clear to auscultation bilaterally.  BACK:  No costovertebral angle tenderness.  CHEST:  Unremarkable.  HEART:  PMI not displaced or sustained.  S1 and S2 within normal limits.  No S3, no S4.  No clicks, no rubs, no murmurs.  ABDOMEN:  Obese.  Positive bowel sounds.  Normal in frequency and pitch.  No bruits, rebound, guarding or midline pulsatile mass.  No hepatomegaly  or splenomegaly.  SKIN:  No rashes.  No nodules.  EXTREMITIES:  2+ pulses throughout.  No edema, cyanosis or clubbing.  NEURO:  Oriented to person, place, and time.  Cranial nerves II-XII  grossly intact.  Motor grossly intact.   EKG:  Sinus rhythm, rate 43, axis within normal limits, intervals within  normal limits, no acute ST-T wave changes.   ASSESSMENT/PLAN:  1. Chest discomfort.  The patient has progressive chest discomfort.      This sounds like it could be angina.  Given this and the pretest      probability of obstructive coronary  disease that is at least      moderate, the next step should be a cardiac catheterization.  I do      not think we can definitively exclude coronary disease with a      stress perfusion study alone.  I have discussed with her the risks      and benefits including stroke, death, myocardial infarction,      vascular trauma, emboli, bleeding, bruising, infection, dye      allergy, and renal insufficiency among others.  She understands and      agrees to proceed.  Will do this in the [facility] lab on Friday.  She will      be given sublingual nitroglycerin.  She will continue on her beta-      blocker.  She will come to the emergency room if she has any severe      episodes.  2. Hypertension.  Blood pressure is slightly elevated.  She will      continue her medications as listed for now.  3. Followup will be at the time of her catheterization.     Rollene Rotunda, MD, [facility]  Electronically Signed    JH/MedQ  DD: 07/08/2007  DT: 07/08/2007  Job #: [id number]

## 2010-07-24 NOTE — Cardiovascular Report (Signed)
[facility], [facility]      ACCOUNT NO.:  [id number]   MEDICAL RECORD NO.:  [id number]          PATIENT TYPE:  OIB   LOCATION:  1999                         FACILITY:  [facility]   PHYSICIAN:  Peter C. Eden Emms, MD, FACCDATE OF BIRTH:  [birth year]   DATE OF PROCEDURE:  07/10/2007  DATE OF DISCHARGE:                            CARDIAC CATHETERIZATION   PROCEDURE:  Coronary arteriography.   INDICATIONS:  Recurrent chest pain.   Cine catheterization was done with 4-French catheter from the right  femoral artery.  The patient is markedly obese.  It took [place] for three or  four attempts to cannulate the right femoral artery but after it was  found with a Seldinger needle, we had no difficulty passing a sheath and  there was no hematoma at the end of the case.   Left main coronary artery was normal.   Left anterior descending artery was normal in its proximal and  midportion.  The distal vessel was extremely small.  There may have been  some spasm from the dye or it is just a diffusely small vessel.  The  first diagonal branch was large and normal.   Circumflex coronary artery was normal.  There was a large first obtuse  marginal branch that had superior and inferior divisions, which was  normal.   Right coronary artery was dominant and normal.   RAO ventriculography:  RAO ventriculography was normal.  EF was 65%.  There was no gradient across the aortic valve and no MR.  LV pressure  was 150/18.  Aortic pressure is 150/79.   Note should be made that the patient did complain of chest pain and left  arm pain during the case.  There were no associated monitor changes.  She was given 4 mg of Versed.   She seemed to tolerate the procedure well.   IMPRESSION:  1. No evidence of epicardial coronary artery disease.      a.     diffusely small distal left anterior descending artery,       probably normal.   The patient will follow up with Dr. Antoine Poche in a few weeks to further  assess  the etiology of her pain.  She was hydrated in the case and we  will watch her closely in the periop state to in regards to her risk for  hematoma, given her size.      [name]. Eden Emms, MD, [facility]  Electronically Signed     PCN/MEDQ  D:  07/10/2007  T:  07/10/2007  Job:  [id number]

## 2010-07-24 NOTE — Op Note (Signed)
NAMESHALISA, Catherine Powell      ACCOUNT NO.:  [id number]   MEDICAL RECORD NO.:  [id number]          PATIENT TYPE:  INP   LOCATION:  1824                         FACILITY:  [facility]   PHYSICIAN:  Ollen Gross. Vernell Morgans, M.D. DATE OF BIRTH:  29-Feb-1966   DATE OF PROCEDURE:  03/29/2007  DATE OF DISCHARGE:                               OPERATIVE REPORT   PREOPERATIVE DIAGNOSES:  Cholecystitis with cholelithiasis.   POSTOPERATIVE DIAGNOSES:  Cholecystitis with cholelithiasis.   PROCEDURE:  Laparoscopic cholecystectomy and repair of umbilical hernia.   SURGEON:  Ollen Gross. Vernell Morgans, M.D.   ASSISTANT:  Dr. Freida Busman.   ANESTHESIA:  General endotracheal.   PROCEDURE IN DETAIL:  After informed consent was obtained the patient  was brought to the operating room and placed in a supine position on the  operating room table.  After adequate induction of general anesthesia  the patient's abdomen was prepped with Betadine and draped in the usual  sterile manner.  The area above the umbilicus was infiltrated with 0.25%  Marcaine.  A small incision was made with a 15 blade knife.  This  incision was carried down through the subcutaneous tissue bluntly with a  hemostat and appendiceal retractors until the linea alba was identified.  The linea alba was incised with a 15 blade knife and each side grasped  with Kocher clamps and elevated anteriorly.  The preperitoneal space was  then probed bluntly with a hemostat until the peritoneum was opened and  access was gained to the abdominal cavity.  A zero Vicryl pursestring  stitch was placed in the fascia around the opening and Hasson cannula  was placed through the opening and anchored in place to the previously  placed Vicryl pursestring stitch.  The abdomen was then insufflated with  carbon dioxide without difficulty.  The patient was placed in reverse  Trendelenburg position and rotated with the right side up.  Next the  epigastric region was infiltrated with  0.25% Marcaine.  A small incision  was made with a 15 blade knife and a 10 mm port was placed bluntly  through this incision into the abdominal cavity under direct vision.  Sites were then chosen laterally on the right side of the abdomen with  placement of 5 mm ports.  Each of these areas was infiltrated with 0.25%  Marcaine.  Small stab incisions were made with a 15 blade knife and 5 mm  ports were placed bluntly through these incisions into the abdominal  cavity under direct vision.  A blunt grasper was placed through the  lateral-most 5 mm port and used to grasp the dome of the gallbladder.  There were a lot of omental adhesions to the body of the gallbladder  that were taken down by a combination of blunt dissection with a  dissector as well as some sharp dissection with the electrocautery.  Once this was accomplished, we were able to elevate the gallbladder.  There was a large stone impacted at the neck of the gallbladder.  We  were able to grab the neck of the gallbladder just below the stone.  The  peritoneal reflection of this area  was opened sharply with  electrocautery.  Blunt dissection was then carried out in this area  until the gallbladder neck and cystic duct junction was readily  identified and a good window was created.  There was a lot of  inflammation in this area and only a short segment of the cystic duct  and gallbladder neck junction was able to be visualized because of her  size.  We did not want to lose control of the cystic duct and she had  completely normal liver functions so we elected not to do the  cholangiogram.  Three clips were placed proximal on the cystic duct and  one distally and one on the gallbladder neck and the cystic duct was  divided between the two sets of clips.  Posterior to this the cystic  artery was identified and again dissected bluntly in a circumferential  manner until a good window was created.  Two clips were placed  proximally and  one distally on the artery and the artery was divided  between the two.  Next a laparoscopic hook cautery device was used to  separate the gallbladder from the liver bed.  This was very difficult  given the amount of inflammation around this large impacted stone.  Several small vessels in the liver bed were controlled with clips and  cautery.  Once the gallbladder had been completely removed from the  liver bed a laparoscopic bag was inserted through the epigastric port.  The gallbladder was placed in the bag and the bag was sealed.  The liver  bed was inspected again and a couple small bleeding points were  coagulated with the electrocautery until the area was completely  hemostatic.  The laparoscope was then moved to the epigastric port.  The  gallbladder grasper was placed through the Hasson cannula and used to  grasp the bag.  The bag with the gallbladder was removed through the  supraumbilical port.  It did require opening the supraumbilical fascia  just a little bit to get this large stone out but we were able to get it  out.  Once we had the bag and stone and Hasson cannula out it was  readily apparent the patient had an umbilical hernia right at the  inferior edge of our incision.  The fat from this hernia was debrided  sharply with the electrocautery until the fascial edges could be  visualized.  The fascial defect was again closed with a couple of figure-  of-eight zero Vicryl stitches as well as the previously placed Vicryl  pursestring stitch.  Prior to doing this a million dollar grasper was  placed through the lateral-most 5 mm port and up through the epigastric  port.  This was used to bring a 75 [place] round Blake drain into the  abdominal cavity.  The drain was placed in the liver bed and brought out  through the skin of the lateral port.  The drain was anchored to the  skin with a 3-0 nylon stitch.  Once the hernia repair was complete the  drain was placed to bulb  suction.  The gas was allowed to escape while  the rest of the ports were removed under direct vision and found to be  hemostatic.  The skin incisions were closed with 4-0 Monocryl  subcuticular stitches and Dermabond dressings were applied.  The patient  tolerated the procedure well.  At the end of the case all needle, sponge  and instrument counts were correct.  The  patient was awakened and taken  to the recovery room in stable condition.      Ollen Gross. Vernell Morgans, M.D.  Electronically Signed     PST/MEDQ  D:  03/29/2007  T:  03/29/2007  Job:  [id number]

## 2010-07-27 NOTE — Op Note (Signed)
Catherine Powell, Catherine Powell             ACCOUNT NO.:  [id number]   MEDICAL RECORD NO.:  [id number]          PATIENT TYPE:  AMB   LOCATION:  DSC                          FACILITY:  [facility]   PHYSICIAN:  Lubertha Basque. Dalldorf, M.D.DATE OF BIRTH:  09-11-1965   DATE OF PROCEDURE:  08/27/2005  DATE OF DISCHARGE:                                 OPERATIVE REPORT   PREOPERATIVE DIAGNOSIS:  Left carpal tunnel syndrome.   POSTOPERATIVE DIAGNOSIS:  Left carpal tunnel syndrome.   PROCEDURE:  Left carpal tunnel release.   ANESTHESIA:  Bier block, MAC, and local.   ATTENDING SURGEON:  Lubertha Basque. Jerl Santos, M.D.   ASSISTANT:  Lindwood Qua, P.A.-C.   INDICATIONS FOR PROCEDURE:  The patient is a 45 year old woman with years of  bilateral hand pain and numbness.  This has persisted despite oral anti-  inflammatories and bracing.  She attempted to go through nerve conduction  studies but could not tolerate the studies.  She has pain which limits her  ability to use her hands on the job and at home and she is offered carpal  tunnel release on the most symptomatic side, which is the left.  Informed  operative consent was obtained after a discussion of the possible  complications of reaction to anesthesia, infection, neurovascular injury.   SUMMARY AND FINDINGS:  Under Bier block and MAC with some supplemental local  anesthetic, a left carpal tunnel release was done.  She did have moderate  thickening of the transverse carpal ligament.  This was released completely.  Her skin was closed and she was discharged home in a splint.   DESCRIPTION OF PROCEDURE:  The patient went to the operating suite where  Bier block and MAC were applied.  She was positioned supine and prepped and  draped in a normal sterile fashion.  After administration of IV Kefzol, a  small palmar incision was made.  She did experience some discomfort and some  additional amount of lidocaine was applied about the incision locally.  We  then  continued our dissection down to the transverse carpal ligament through  the palmar fascia.  The transverse carpal ligament was then released  completely from the transverse arch of vessels distally through the distal  fascia of the forearm proximally.  There did seem to be some moderate  thickening of this ligament and subsequent compression of the median nerve  in the carpal tunnel.  The skin was reapproximated with vertical mattress  sutures of nylon.  The tourniquet was deflated her fingers became pink and  warm immediately.  Adaptic was applied followed by a dry gauze and a volar  splint of plaster with the wrist in slight extension.  Estimated blood loss  and interoperative fluids can be obtained from anesthesia records as can an  accurate tourniquet time.   DISPOSITION:  The patient was taken to the recovery room in stable addition.  She was to go home the same day and follow up with [place] in one week.  I will  contact her by phone tonight.      Lubertha Basque Jerl Santos, M.D.  Electronically Signed  PGD/MEDQ  D:  08/27/2005  T:  08/27/2005  Job:  [id number]

## 2010-07-27 NOTE — Op Note (Signed)
Catherine Powell, Catherine Powell                         ACCOUNT NO.:  [id number]   MEDICAL RECORD NO.:  [id number]                   PATIENT TYPE:   LOCATION:                                       FACILITY:   PHYSICIAN:  Anselm Pancoast. Zachery Dakins, M.D.          DATE OF BIRTH:   DATE OF PROCEDURE:  10/08/2001  DATE OF DISCHARGE:                                 OPERATIVE REPORT   PREOPERATIVE DIAGNOSES:  Internal and external hemorrhoids, possible  probably posterior anal fissure.   POSTOPERATIVE DIAGNOSES:  Posterior anal fissure with internal and external  hemorrhoids.   OPERATION:  Proctoscopic internal and external hemorrhoidectomy and internal  sphincterotomy.   ANESTHESIA:  General.   INDICATIONS:  The patient is a 44 year old, very heavy female who I saw in  the office recently and she has had severe anal pain and problems with  hemorrhoids with little bleeding over the past several weeks, and she said  that this has been an on and off problem.  She had problems after her child  was born about 12 years ago, but then recently has had significant  discomfort. On examination, she has large kind of thrombosed anal tags,  predominately posterior, and a question of what I could see a fissure, and  then kind of very minimal hemorrhoids in the other anterior quadrants. I  recommend that we proceed on with  a formal internal and external  hemorrhoidectomy and she agrees for the planned procedure today. She was  preoperatively assessed with an ECG and chest x-ray because of her weight of  greater than 325 pounds, but no abnormalities were definitely noted.   DESCRIPTION OF PROCEDURE:  The patient was taken to the operative suite,  given a gram of Kefzol and induction of general anesthesia endotracheal tube  and then placed in the lithotomy position. A proctologic examination was  performed to about 22 cm and she does not have a real good prep.  The stool  is loose, but the mucosal looks  normal. She does have a posterior anal  fissure in this kind of complex of thrombosed previous hemorrhoid tags and  as said are predominately posterior. I then prepped the perianal area with  Betadine soap and solution and then draped her in a sterile manner.  The  internal sphincter area was first infiltrated with about 10 cc of Marcaine  with ________ on each side and then I basically used the large anoscope. She  has a moderate internal hemorrhoid with very little external component  anteriorly and I just basically excised this and kind of  sutured the area.  I had placed the Passager for hemostasis prior to doing the hemorrhoidectomy  and then just sutured the incision with a running 2-0 chromic and then took  it back to the original area where it was tied. Next, the large area of  which is slightly to the left of midline and some small tags  predominately  posterior. I elected to just go ahead and do that quadrant next, elevating  the external tags and etc. after large anoscope had been placed exposing  this, and then exposed the internal sphincter of which I divided with  cautery. I took the area on up anteriorly, used a Buie clamp to clamp the  internal portion, removed the hemorrhoidal tags and etc., and then sutured  the pedicle first with a couple of mattress sutures with 2-0 chromic and  then used a locking internal stitch of 2-0 chromic to close the  hemorrhoidectomy. There was a little tag to the right of posterior midline  that I also excised to enclose it included in the same incision and then  closed the external portion with interrupted single sutures of 2-0 chromic.  Basically both right and left really does not show any significant  hemorrhoids externally and I elected not to do any other quadrants. The  patient tolerated the procedure, awoke quickly, was extubated the I had  placed Xylocaine ointment  within the anal canal on gauze 4x4s after inspecting to the suture line  and  there was good hemostasis. The patient will be out of work for probably  about two weeks. I will see her in the office in approximately a week and  hopefully she will have decrease in anal discomfort fairly quickly.                                               Anselm Pancoast. Zachery Dakins, M.D.    WJW/MEDQ  D:  10/08/2001  T:  10/14/2001  Job:  [id number]

## 2010-08-14 ENCOUNTER — Other Ambulatory Visit: Payer: Self-pay | Admitting: Sports Medicine

## 2010-08-14 MED ORDER — HYDROCODONE-ACETAMINOPHEN 5-500 MG PO TABS: 1.0000 | ORAL_TABLET | ORAL | Status: DC | PRN

## 2010-08-14 NOTE — Telephone Encounter (Signed)
In envelope at front, instructions to not fill till the 14th.

## 2010-08-14 NOTE — Telephone Encounter (Signed)
Pt requesting refill on vicodin for her headaches.

## 2010-08-15 NOTE — Telephone Encounter (Signed)
Spoke with patient and informed of below 

## 2010-08-21 ENCOUNTER — Encounter: Payer: Self-pay | Admitting: Sports Medicine

## 2010-08-29 ENCOUNTER — Ambulatory Visit (INDEPENDENT_AMBULATORY_CARE_PROVIDER_SITE_OTHER): Payer: No Typology Code available for payment source | Admitting: Sports Medicine

## 2010-08-29 ENCOUNTER — Encounter: Payer: Self-pay | Admitting: Sports Medicine

## 2010-08-29 DIAGNOSIS — N898 Other specified noninflammatory disorders of vagina: Secondary | ICD-10-CM

## 2010-08-29 DIAGNOSIS — I10 Essential (primary) hypertension: Secondary | ICD-10-CM

## 2010-08-29 DIAGNOSIS — J45909 Unspecified asthma, uncomplicated: Secondary | ICD-10-CM

## 2010-08-29 MED ORDER — FORMOTEROL FUMARATE 12 MCG IN CAPS: 12.0000 ug | ORAL_CAPSULE | Freq: Two times a day (BID) | RESPIRATORY_TRACT | Status: DC

## 2010-08-29 MED ORDER — METRONIDAZOLE 500 MG PO TABS: 500.0000 mg | ORAL_TABLET | Freq: Two times a day (BID) | ORAL | Status: AC

## 2010-08-29 MED ORDER — BUDESONIDE 180 MCG/ACT IN AEPB: 1.0000 | INHALATION_SPRAY | Freq: Two times a day (BID) | RESPIRATORY_TRACT | Status: DC

## 2010-08-29 MED ORDER — HYDROCHLOROTHIAZIDE 12.5 MG PO CAPS: 12.5000 mg | ORAL_CAPSULE | Freq: Every day | ORAL | Status: DC

## 2010-08-29 MED ORDER — LISINOPRIL 10 MG PO TABS: 10.0000 mg | ORAL_TABLET | Freq: Every day | ORAL | Status: DC

## 2010-08-29 NOTE — Assessment & Plan Note (Signed)
Back to foradil and pulmicort per pt request.

## 2010-08-29 NOTE — Progress Notes (Signed)
  Subjective:    Patient ID: Catherine Powell, female    DOB: [birth year], 45 y.o.   MRN: [id number]  HPI VAGINAL DISCHARGE Onset: several days Description: whitish, smelly Modifying factors: unlike previous yeast infections.  On chronic suppressive tx for yeast.  Symptoms Odor: YES Itching: NO Vaginal burning: NO Dysuria: NO Bleeding: NO Pelvic pain: NO Back pain: NO Fever: NO Genital sores: NO Rash: NO Dyspareunia: NO GI Symptoms: NO  Red Flags:  Missed period: NO Recent antibiotics: NO Possible STD exposure: NO IUD: NO Diabetes: NO    Review of Systems    See HPI Objective:   Physical Exam  Constitutional: She appears well-developed and well-nourished.  Abdominal: Soft. Bowel sounds are normal. She exhibits no distension and no mass. There is no tenderness. There is no rebound and no guarding.  Skin: Skin is warm and dry.          Assessment & Plan:

## 2010-08-29 NOTE — Assessment & Plan Note (Signed)
On preventive diflucan. Odor suggests BV, too late for lab to run [facility]. Will treat empirically with flagyl. RTC prn.

## 2010-08-29 NOTE — Assessment & Plan Note (Addendum)
Changing back to lisinopril and HCTZ per pt request.

## 2010-08-31 ENCOUNTER — Telehealth: Payer: Self-pay | Admitting: Sports Medicine

## 2010-08-31 NOTE — Telephone Encounter (Signed)
Called [facility] Crystal and told her that it would be ok to split tablet.Marland KitchenMarland KitchenLoralee Pacas [place]

## 2010-08-31 NOTE — Telephone Encounter (Signed)
Pt prescribed hctz, 12.5 mg capsule, they only stock the 25 mg tablet with pt taking half, please call to ok change.

## 2010-09-10 ENCOUNTER — Telehealth: Payer: Self-pay | Admitting: *Deleted

## 2010-09-10 DIAGNOSIS — I10 Essential (primary) hypertension: Secondary | ICD-10-CM

## 2010-09-10 MED ORDER — QUINAPRIL HCL 20 MG PO TABS: 20.0000 mg | ORAL_TABLET | Freq: Every day | ORAL | Status: DC

## 2010-09-10 NOTE — Telephone Encounter (Signed)
[facility] stating if they give patient lisinopril as ordered by Dr. [name] last week it will cost patient $12.00.  IF MD will change to accupril  they can give this for free and then patient will only have to pay for HCTZ. Will send message to Dr. Leveda Anna preceptor today.

## 2010-09-10 NOTE — Assessment & Plan Note (Signed)
Change med based on formulary.

## 2010-09-19 ENCOUNTER — Telehealth: Payer: Self-pay | Admitting: Family Medicine

## 2010-09-19 NOTE — Telephone Encounter (Signed)
Janan Halter are the new PCP for this patient. This patient is required to come in before the refill can be made. I just wanted to inform you of this beforehand.  ---H[place]ley Dec

## 2010-09-19 NOTE — Telephone Encounter (Signed)
Needs new rx for his hydrocodone pls call when ready

## 2010-09-19 NOTE — Telephone Encounter (Signed)
Pt made an appt for 7/30 - she will be out before then, will she be able to get enough to last until appt?  She said that Dr T had told her that all she had to do was call to get the refill and I told her that the policy has changed.

## 2010-09-19 NOTE — Telephone Encounter (Signed)
Yes, pt will require a visit

## 2010-09-19 NOTE — Telephone Encounter (Signed)
How is it that she received a prescription on 08/14/10 with no encounter or documentation?  The prescription seems to say that the medication is for migraines.  Vicodin is not a good medication for migraines, esp if she is using 60 tablets per month, and unless I am missing something, I see no documentation from around the time the prescription was written.

## 2010-09-20 NOTE — Telephone Encounter (Signed)
Catherine Powell, I spoke with patient and once again informed of below. She was not happy, but understands the situation. She stated that she will go to ER or come in as a work in if needed. She used to be on 120 vicodin per month and was decreased. She would like for you to look through her chart and notes from other headache doctors so the situation can be addressed correctly. ---H[place]ley Dec

## 2010-09-21 NOTE — Telephone Encounter (Signed)
I have reviewed the terms of the patient's pain contract. Per those terms, refills to controlled substances will only be provided during office hours at a scheduled appointment. I do see that she has been seen by a migraine specialist in the past and that she is unable to afford to go to him at this point in time. I also see that most recently she has been sporadically receiving prescriptions for 60 tablets of Vicodin. As it does appear that the patient has received adequate workup and has failed multiple other treatments, I would be happy to provide the patient with this prescription assuming that she comes to see me during a regularly scheduled appointment and will consent to a urine drug screen. I would also like the patient to begin working toward getting back to see the migraine specialist if, indeed, she has not been seen there in the last several years.

## 2010-10-02 ENCOUNTER — Ambulatory Visit (INDEPENDENT_AMBULATORY_CARE_PROVIDER_SITE_OTHER): Payer: No Typology Code available for payment source | Admitting: Family Medicine

## 2010-10-02 VITALS — BP 160/91 | HR 80 | Temp 98.3°F | Ht 67.5 in | Wt 380.0 lb

## 2010-10-02 DIAGNOSIS — M7989 Other specified soft tissue disorders: Secondary | ICD-10-CM

## 2010-10-02 LAB — BASIC METABOLIC PANEL
BUN: 9 mg/dL (ref 6–23)
Chloride: 105 mEq/L (ref 96–112)
Creat: 0.67 mg/dL (ref 0.50–1.10)

## 2010-10-02 MED ORDER — FUROSEMIDE 40 MG PO TABS: 40.0000 mg | ORAL_TABLET | Freq: Two times a day (BID) | ORAL | Status: DC

## 2010-10-02 NOTE — Progress Notes (Signed)
[name] notes bilateral leg swelling starting Friday (4 days ago). She starting taking left over lasix 40 bid on Saturday. However on Saturday her swelling was at its worse. The swelling started reducing on Sunday and by today is much reduced. She denies any chest pain or worsening breathing. No current leg pain. No cough or racing heart.   PMH: Significant for obesity and a history of being on lasix in the past.  ROS as above otherwise neg  Exam:  Vs BP 160/91  Pulse 80  Temp(Src) 98.3 F (36.8 C) (Oral)  Ht 5' 7.5" (1.715 m)  Wt 380 lb (172.367 kg)  BMI 58.64 kg/m2 Gen: Well NAD HEENT: EOMI, PERRL, MMM Lungs: CTABL Nl WOB Heart: RRR no MRG Abd: NABS, NT, ND Exts: Obese BL. Trace edema present BL ankles. Calfs same size.    Skin: Right lower ext: Tibial area small dime size erythremia surrounding clear vessicle. Non tender.

## 2010-10-02 NOTE — Patient Instructions (Signed)
Thank you for coming in today. We will get some labs and X-ray I will let you know if they need attention.  Take the lasix 2x a day until you feel that you legs are back to normal.  Measure your weight. That is your "dry weight". Try stoping lasix when at your dry weight and see how you weight and legs change. If you start going up try taking it 1x or 2x a day.  Write you weight changes and how much lasix you took in a log.  Take that log to Dr. Louanne Belton.  If you have problems like chest pain or trouble catching your breath please come back.  If you leg does not improve come back.  See Dr. Louanne Belton on the 30th.

## 2010-10-02 NOTE — Assessment & Plan Note (Addendum)
I think the etiology to this problem is likely due to fluid retention due to either: CHF, CKD, Excessive salt load. 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 I feel that DVT is unlikely as this problem responded well to lasix and and is bilateral. Her legs are equal sizes on both sides.   Plan: Begin initial workup with BMP, BNP and CXR. Will also continue lasix therapy at 40 bid.  Pt will follow up with her PCP on the 30th. Red flags reviewed and patient expresses understanding.

## 2010-10-03 ENCOUNTER — Ambulatory Visit (HOSPITAL_COMMUNITY)
Admission: RE | Admit: 2010-10-03 | Discharge: 2010-10-03 | Disposition: A | Discharge status: Home / Self Care | Payer: Self-pay | Source: Ambulatory Visit | Attending: Family Medicine | Admitting: Family Medicine

## 2010-10-03 DIAGNOSIS — M7989 Other specified soft tissue disorders: Secondary | ICD-10-CM | POA: Insufficient documentation

## 2010-10-03 DIAGNOSIS — I517 Cardiomegaly: Secondary | ICD-10-CM | POA: Insufficient documentation

## 2010-10-08 ENCOUNTER — Ambulatory Visit (INDEPENDENT_AMBULATORY_CARE_PROVIDER_SITE_OTHER): Payer: Self-pay | Admitting: Family Medicine

## 2010-10-08 ENCOUNTER — Encounter: Payer: Self-pay | Admitting: Family Medicine

## 2010-10-08 VITALS — BP 153/93 | HR 85 | Ht 68.0 in | Wt 372.0 lb

## 2010-10-08 DIAGNOSIS — J45909 Unspecified asthma, uncomplicated: Secondary | ICD-10-CM

## 2010-10-08 DIAGNOSIS — G43919 Migraine, unspecified, intractable, without status migrainosus: Secondary | ICD-10-CM

## 2010-10-08 DIAGNOSIS — M25569 Pain in unspecified knee: Secondary | ICD-10-CM

## 2010-10-08 MED ORDER — HYDROCODONE-ACETAMINOPHEN 5-325 MG PO TABS
1.0000 | ORAL_TABLET | ORAL | Status: DC | PRN
Start: 2010-10-08 — End: 2011-11-22

## 2010-10-08 MED ORDER — LISINOPRIL-HYDROCHLOROTHIAZIDE 10-12.5 MG PO TABS: 1.0000 | ORAL_TABLET | Freq: Every day | ORAL | Status: DC

## 2010-10-08 MED ORDER — BUDESONIDE 180 MCG/ACT IN AEPB: 1.0000 | INHALATION_SPRAY | Freq: Two times a day (BID) | RESPIRATORY_TRACT | Status: DC

## 2010-10-08 MED ORDER — FORMOTEROL FUMARATE 12 MCG IN CAPS: 12.0000 ug | ORAL_CAPSULE | Freq: Two times a day (BID) | RESPIRATORY_TRACT | Status: DC

## 2010-10-08 NOTE — Patient Instructions (Signed)
It was good to see you today! Please make an appointment to see me about a knee injection when you are feeling up to it. I have given you refills on your medications.

## 2010-10-15 NOTE — Progress Notes (Signed)
Subjective: Pt presents today to meet new physician.  She reports having problems with headaches for many years.  She has been seen by a headache specialist at [facility] and tried on many medications.  None appear to have worked and the only thing that seems to work in Vicodin.  She tries to use as few as possible but had headaches 2-3 times per week and requires 3-4 Vicodin to stop them.  She also reports right knee pain that has been present on and off for several years.  She has had injections before and these have been helpful.  She has known osteoarthritis of the knee and has been receiving injections intermittently for some time.  She does not report any problems with swelling, redness, fevers/chills or other concerning symptoms.  Objective:  Filed Vitals:   10/08/10 1033  BP: 153/93  Pulse: 85   Gen: NAD, morbidly obese CV: RRR Resp: CTABL Ext: crepitis bilaterally in knees, no erythema/sweling or warmth.

## 2010-10-15 NOTE — Assessment & Plan Note (Signed)
No evidence of infection.  Will provide an injection at the patient's convenience assuming it has been greater than 3-6 months since last injection.  Will likely require a preceptor who is comfortable with knee injections as this will be a somewhat challenging injection.

## 2010-10-15 NOTE — Assessment & Plan Note (Signed)
Will continue to provide refills assuming she does not exhibit escalating behavior.  Per discussion with the patient, will change from vicodin to norco to decrease the chance of tylenol toxicity.

## 2010-11-06 ENCOUNTER — Ambulatory Visit (INDEPENDENT_AMBULATORY_CARE_PROVIDER_SITE_OTHER): Payer: Self-pay | Admitting: Sports Medicine

## 2010-11-06 ENCOUNTER — Encounter: Payer: Self-pay | Admitting: Sports Medicine

## 2010-11-06 VITALS — BP 166/84 | HR 80 | Ht 69.0 in | Wt 370.0 lb

## 2010-11-06 DIAGNOSIS — IMO0002 Reserved for concepts with insufficient information to code with codable children: Secondary | ICD-10-CM

## 2010-11-06 DIAGNOSIS — M1710 Unilateral primary osteoarthritis, unspecified knee: Secondary | ICD-10-CM

## 2010-11-06 DIAGNOSIS — M179 Osteoarthritis of knee, unspecified: Secondary | ICD-10-CM

## 2010-11-06 NOTE — Progress Notes (Signed)
  Subjective:    Patient ID: Catherine Powell, female    DOB: 1966-02-27, 45 y.o.   MRN: [id number]  HPI Patient is a very pleasant 45 year old female well-known to me, she was a patient at the family practice center last year. 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 She has failed a conservative therapy, with oral medications in the past, and her last corticosteroid injection was in April of 2012. This injection lasted her at least 3 months and she was very happy with this. She was seen for recurrent knee pain at the family practice center recently, and was sent here for steroid injections.  Past medical history: Completely reviewed and no changes needed. Past surgical history: Completely reviewed and no changes needed. Social history: No alcohol, tobacco, or drugs. Family history: Completely reviewed and no changes needed. Allergies: Completely reviewed and no changes needed. Medications: Completely reviewed and no changes needed.  Review of Systems    denies fevers, chills, night sweats, weight loss. Objective:   Physical Exam General: Well-developed, obese Caucasian female in no acute distress. Neuro: Alert and oriented x3. Skin: Warm and dry. Musculoskeletal: Knee: Normal to inspection with no erythema or effusion or obvious bony abnormalities no difficult to tell as landmarks were lost secondary to panus. Palpation normal with no warmth, joint line tenderness, patellar tenderness, or condyle tenderness. She did have some bilateral joint line tenderness. ROM full in flexion and extension and lower leg rotation. Ligaments with solid consistent endpoints including ACL, PCL, LCL, MCL. Negative Mcmurray's, Apley's, and Thessalonian tests. Non painful patellar compression. Patellar glide without crepitus. Patellar and quadriceps tendons unremarkable. Hamstring and quadriceps strength is normal.   Consent obtained and  verified. Time-out conducted. Noted no overlying erythema, induration, or other signs of local infection. Sterile betadine prep. Furthur cleansed with alcohol. Topical analgesic spray: Ethyl chloride. Joint: Bilateral knees Approached in typical fashion with: Medial parapatellar approach Completed without difficulty Meds: 1 cc Kenalog-40, 4 cc lidocaine 1% into each knee Advised to call if fevers/chills, erythema, induration, drainage, or persistent bleeding.    Assessment & Plan:

## 2010-11-06 NOTE — Assessment & Plan Note (Signed)
Injection as above. Again we emphasized weight loss strategies. I would be happy to see her back in a period of 3 months to reinject if needed.

## 2010-11-09 ENCOUNTER — Telehealth: Payer: Self-pay | Admitting: Family Medicine

## 2010-11-09 NOTE — Telephone Encounter (Signed)
Forwarded to pcp.Barkley, Tonya Lynetta  

## 2010-11-09 NOTE — Telephone Encounter (Signed)
Catherine Powell is calling because she needs a refill on her Vicodin.

## 2010-11-14 MED ORDER — HYDROCODONE-ACETAMINOPHEN 5-500 MG PO TABS: 1.0000 | ORAL_TABLET | Freq: Four times a day (QID) | ORAL | Status: DC | PRN

## 2010-11-14 NOTE — Telephone Encounter (Signed)
Please call and let her know prescription is ready for pickup.  She will need an appointment with me for any more of this medication.

## 2010-11-15 ENCOUNTER — Ambulatory Visit (INDEPENDENT_AMBULATORY_CARE_PROVIDER_SITE_OTHER): Payer: Self-pay | Admitting: Family Medicine

## 2010-11-15 VITALS — BP 155/83 | HR 84 | Temp 97.6°F | Ht 68.6 in | Wt 375.0 lb

## 2010-11-15 DIAGNOSIS — G43919 Migraine, unspecified, intractable, without status migrainosus: Secondary | ICD-10-CM

## 2010-11-15 DIAGNOSIS — E669 Obesity, unspecified: Secondary | ICD-10-CM

## 2010-11-15 DIAGNOSIS — M7989 Other specified soft tissue disorders: Secondary | ICD-10-CM

## 2010-11-15 LAB — BASIC METABOLIC PANEL
BUN: 8 mg/dL (ref 6–23)
Calcium: 8.8 mg/dL (ref 8.4–10.5)
Glucose, Bld: 110 mg/dL — ABNORMAL HIGH (ref 70–99)
Sodium: 138 mEq/L (ref 135–145)

## 2010-11-15 MED ORDER — FUROSEMIDE 80 MG PO TABS: 80.0000 mg | ORAL_TABLET | Freq: Two times a day (BID) | ORAL | Status: DC

## 2010-11-15 MED ORDER — HYDROCODONE-ACETAMINOPHEN 5-500 MG PO TABS: 1.0000 | ORAL_TABLET | Freq: Four times a day (QID) | ORAL | Status: DC | PRN

## 2010-11-15 NOTE — Patient Instructions (Addendum)
I would like you to go to a medical supply store and specifically ask them if they can fit you for support hose. I am giving you a refill on your vicodin. We will increase your lasix to 80 mg twice daily and check some blood work today. I would also recommend seeing our nutritionist Dr. Gerilyn Pilgrim.  You can schedule an appointment on your way out.  She can help with finding any areas in your diet that might benefit from change.

## 2010-11-15 NOTE — Assessment & Plan Note (Signed)
Evidence of chronic changes that are likely ongoing for longer than since July when she says they started. No evidence of other etiology than venous stasis.  Will obtain BMET today to check electrolytes and increase lasix to 80 bid.  Will also recommend going to medical supply store for their opinion about what is available for compression for her.

## 2010-11-15 NOTE — Assessment & Plan Note (Signed)
Encouraged continue exercise and watching what she eats.  Will check back with her in 6 weeks.  Pt not interested in MNT at this time.

## 2010-11-15 NOTE — Assessment & Plan Note (Signed)
Will give 90 vicodin with 3 refills.

## 2010-11-15 NOTE — Progress Notes (Signed)
Subjective: Pt presents requesting a refill on her vicoden for chronic migrains.  She also complains of leg swelling beginning around noon, worsening throughout the day.  She was seen for this in July and had an unremarkable BMET, BNP, and CXR.  She was started on Lasix 40 BID which she does not feel is helping significnatly.  She says that there are not any support hose that fit her.  She has been trying to increase her activity and watch what she eats.  Objective:  Filed Vitals:   11/15/10 0850  BP: 155/83  Pulse: 84  Temp: 97.6 F (36.4 C)   Gen: NAD, morbidly obese Ext: 1+ pitting edema bilaterally, legs are extremely obese with venous stasis changes.  No open wounds.  2+ pulses.

## 2010-11-21 ENCOUNTER — Ambulatory Visit: Payer: Self-pay | Admitting: Family Medicine

## 2010-11-29 LAB — I-STAT 8, (EC8 V) (CONVERTED LAB)
BUN: 8
Bicarbonate: 23.2
Chloride: 105
HCT: 43
Hemoglobin: 14.6
Operator id: 133351
Sodium: 135

## 2010-11-29 LAB — POCT PREGNANCY, URINE
Operator id: 133351
Preg Test, Ur: NEGATIVE

## 2010-11-29 LAB — URINE MICROSCOPIC-ADD ON

## 2010-11-29 LAB — POCT CARDIAC MARKERS: Troponin i, poc: <0.05

## 2010-11-29 LAB — HEPATIC FUNCTION PANEL
ALT: 12
AST: 15
Albumin: 3.5
Alkaline Phosphatase: 48
Total Protein: 7.5

## 2010-11-29 LAB — DIFFERENTIAL
Lymphocytes Relative: 23
Lymphs Abs: 2.8
Neutro Abs: 8.7 — ABNORMAL HIGH
Neutrophils Relative %: 70

## 2010-11-29 LAB — URINALYSIS, ROUTINE W REFLEX MICROSCOPIC
Bilirubin Urine: NEGATIVE
Hgb urine dipstick: NEGATIVE
Specific Gravity, Urine: 1 — ABNORMAL LOW
pH: 7.5

## 2010-11-29 LAB — CBC
HCT: 38.9
Platelets: 297
WBC: 12.4 — ABNORMAL HIGH

## 2010-11-29 LAB — POCT I-STAT CREATININE: Creatinine, Ser: 0.7

## 2010-12-05 LAB — CARDIAC PANEL(CRET KIN+CKTOT+MB+TROPI)
Total CK: 46
Total CK: 51

## 2010-12-07 LAB — CBC
HCT: 36.7
Hemoglobin: 12.1
MCHC: 33
MCV: 87.3
Platelets: 309
RBC: 4.2
RDW: 13.7
WBC: 9.2

## 2010-12-07 LAB — BASIC METABOLIC PANEL
BUN: 5 — ABNORMAL LOW
CO2: 28
Calcium: 8.8
Chloride: 104
Creatinine, Ser: 0.62
GFR calc Af Amer: >60
GFR calc non Af Amer: >60
Glucose, Bld: 94
Potassium: 4.1
Sodium: 136

## 2010-12-20 LAB — CBC
HCT: 36
Hemoglobin: 12.1
MCV: 84.7
Platelets: 294
RDW: 13.9

## 2010-12-20 LAB — DIFFERENTIAL
Eosinophils Absolute: 0.1
Eosinophils Relative: 1
Lymphocytes Relative: 22
Lymphs Abs: 1.5
Monocytes Relative: 5

## 2010-12-20 LAB — BASIC METABOLIC PANEL
BUN: 5 — ABNORMAL LOW
CO2: 27
Chloride: 109
Glucose, Bld: 117 — ABNORMAL HIGH
Potassium: 4.1
Sodium: 141

## 2010-12-20 LAB — POCT CARDIAC MARKERS
Myoglobin, poc: 74.5
Operator id: 285491
Troponin i, poc: <0.05

## 2010-12-20 LAB — D-DIMER, QUANTITATIVE: D-Dimer, Quant: 0.4

## 2010-12-20 LAB — TROPONIN I: Troponin I: <0.01

## 2010-12-20 LAB — B-NATRIURETIC PEPTIDE (CONVERTED LAB): Pro B Natriuretic peptide (BNP): 59

## 2010-12-20 LAB — CK TOTAL AND CKMB (NOT AT ARMC): CK, MB: 0.6

## 2011-03-07 ENCOUNTER — Telehealth: Payer: Self-pay | Admitting: *Deleted

## 2011-03-07 DIAGNOSIS — G43919 Migraine, unspecified, intractable, without status migrainosus: Secondary | ICD-10-CM

## 2011-03-07 NOTE — Telephone Encounter (Signed)
Dr. Louanne Belton signed and completed fax from pharmacy for Vicodin 5/500 mg , one tab every 6 hours as needed for migraine pain # 90 tabs and  3 refills.    RX called to pharmacy and notified that patient will need appointment for refills beyond this. Will ask  Dr. Louanne Belton to enter this on med list.

## 2011-03-15 MED ORDER — HYDROCODONE-ACETAMINOPHEN 5-500 MG PO TABS: 1.0000 | ORAL_TABLET | Freq: Four times a day (QID) | ORAL | Status: DC | PRN

## 2011-03-19 ENCOUNTER — Ambulatory Visit (INDEPENDENT_AMBULATORY_CARE_PROVIDER_SITE_OTHER): Payer: Self-pay | Admitting: Sports Medicine

## 2011-03-19 ENCOUNTER — Encounter: Payer: Self-pay | Admitting: Sports Medicine

## 2011-03-19 VITALS — BP 145/85 | HR 73

## 2011-03-19 DIAGNOSIS — IMO0002 Reserved for concepts with insufficient information to code with codable children: Secondary | ICD-10-CM

## 2011-03-19 DIAGNOSIS — M179 Osteoarthritis of knee, unspecified: Secondary | ICD-10-CM

## 2011-03-19 DIAGNOSIS — M1710 Unilateral primary osteoarthritis, unspecified knee: Secondary | ICD-10-CM

## 2011-03-19 NOTE — Patient Instructions (Signed)
Great to see you Catherine Powell,  Injected your knees. Come back to see me as needed.   [name]. [name], M.D. [facility] [address], [place] [address] [phone]

## 2011-03-19 NOTE — Progress Notes (Signed)
  Subjective:    Patient ID: Catherine Powell, female    DOB: July 09, 1965, 46 y.o.   MRN: [id number]  Knee Pain    Patient is a very pleasant 46 year old female well-known to me, she was a patient at the family practice center last year. 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 She has failed a conservative therapy, with oral medications in the past.  Her last injection was done in September of 2012, and lasted 6-8 weeks. She would like another one today.   Review of Systems     denies fevers, chills, night sweats, weight loss. Objective:   Physical Exam  General: Well-developed, obese Caucasian female in no acute distress. Musculoskeletal: Knees: Normal to inspection with no erythema or effusion or obvious bony abnormalities no difficult to tell as landmarks were lost secondary to panus. Palpation normal with no warmth, joint line tenderness, patellar tenderness, or condyle tenderness. She did have some bilateral joint line tenderness. ROM full in flexion and extension and lower leg rotation. Ligaments with solid consistent endpoints including ACL, PCL, LCL, MCL. Negative Mcmurray's, Apley's, and Thessalonian tests. Non painful patellar compression. Patellar glide without crepitus. Patellar and quadriceps tendons unremarkable. Hamstring and quadriceps strength is normal.   Attention ultrasound guided injection, however suprapatellar recess was not visible due to adiposity. Consent obtained and verified. Time-out conducted. Noted no overlying erythema, induration, or other signs of local infection. Skin prepped in a sterile fashion. Topical analgesic spray: Ethyl chloride. Joint: Both knees Needle: 25-gauge  Completed without difficulty. Meds: Cc Depo-Medrol 40, 4 cc lidocaine. Pain immediately improved suggesting accurate placement of the medication. Advised to call if fevers/chills, erythema, induration, drainage, or  persistent bleeding.     Assessment & Plan:   1:Bilateral end stage DJD knees:  Injection as above. She may come back to see [place] on an as-needed basis.

## 2011-03-29 ENCOUNTER — Ambulatory Visit: Payer: Self-pay | Admitting: Family Medicine

## 2011-04-04 ENCOUNTER — Encounter: Payer: Self-pay | Admitting: Family Medicine

## 2011-04-04 ENCOUNTER — Ambulatory Visit (INDEPENDENT_AMBULATORY_CARE_PROVIDER_SITE_OTHER): Payer: Self-pay | Admitting: Family Medicine

## 2011-04-04 VITALS — BP 168/93 | HR 62 | Temp 97.6°F | Ht 68.5 in | Wt 372.6 lb

## 2011-04-04 DIAGNOSIS — I10 Essential (primary) hypertension: Secondary | ICD-10-CM

## 2011-04-04 DIAGNOSIS — E669 Obesity, unspecified: Secondary | ICD-10-CM

## 2011-04-04 DIAGNOSIS — G43919 Migraine, unspecified, intractable, without status migrainosus: Secondary | ICD-10-CM

## 2011-04-04 MED ORDER — LISINOPRIL-HYDROCHLOROTHIAZIDE 10-12.5 MG PO TABS: 1.0000 | ORAL_TABLET | Freq: Every day | ORAL | Status: DC

## 2011-04-04 MED ORDER — CARVEDILOL 6.25 MG PO TABS: 12.5000 mg | ORAL_TABLET | Freq: Two times a day (BID) | ORAL | Status: DC

## 2011-04-04 MED ORDER — HYDROCODONE-ACETAMINOPHEN 5-500 MG PO TABS: 1.0000 | ORAL_TABLET | Freq: Four times a day (QID) | ORAL | Status: DC | PRN

## 2011-04-04 NOTE — Assessment & Plan Note (Signed)
Patient has been hypertensive for multiple visits. We will increase her Coreg today. Per patient request I will not increase either her lisinopril or hydrochlorothiazide. She feels that they cause her some back pain.

## 2011-04-04 NOTE — Progress Notes (Signed)
Subjective: The patient is a 46 y.o. year old female who presents today for followup of multiple issues. 1) headaches: The patient continues to have problems with her typical migraine headaches. She is having headaches 3-5 days per week. She is continuing to have to use her Vicodin. She reports that she is working on getting a job and hopes to have insurance at some point in time. At that time she would be open to going back to the [facility]. 2) obesity: The patient has started walking 3 days per week and is watching what she eats. She has lost several pounds. She understands the importance of weight loss and is attempting to prioritize it. 3) hypertension: Patient is not having any problems with chest pain, shortness of breath, visual changes, or headaches out of her ordinary. She's not having any racing heartbeat or significant problems with leg swelling.  Objective:  Filed Vitals:   04/04/11 0957  BP: 168/93  Pulse: 62  Temp: 97.6 F (36.4 C)   Gen: Morbidly obese female, no acute distress CV: Regular rate and rhythm, no murmurs appreciated Resp: Clear to auscultation bilaterally, decreased secondary to body habitus Ext: No edema, 2+ pulses  Assessment/Plan:  Please also see individual problems in problem list for problem-specific plans.

## 2011-04-04 NOTE — Assessment & Plan Note (Signed)
The patient is working on losing weight. She has lost several pounds since her last appointment. I provided her with encouragement and we'll continue to provide assistance as appropriate.

## 2011-04-04 NOTE — Assessment & Plan Note (Signed)
Headaches 3-5 days per week. I am providing a refill of the patient's Vicodin. I am continuing to encourage the patient to try to come up with a way to get back to the [facility]. I feel that her headaches are interfering with her life enough that a headache specialist would be advisable.

## 2011-04-04 NOTE — Patient Instructions (Signed)
It was good to see you today! As we discussed, all refills on the Vicodin beyond what is on the prescription need to be make in an office visit. I am increasing your carvedilol to 12.5mg  two times daily.  Come back to see me in one month to see if we need to do more working on your blood pressure. Congratulations on loosing 3 lbs!  Keep up the good work!

## 2011-05-06 ENCOUNTER — Ambulatory Visit: Payer: Self-pay | Admitting: Family Medicine

## 2011-07-26 ENCOUNTER — Telehealth: Payer: Self-pay | Admitting: Family Medicine

## 2011-07-26 NOTE — Telephone Encounter (Signed)
As we have discussed before at her appointments, this medication will only be filled in an office visit.

## 2011-07-26 NOTE — Telephone Encounter (Signed)
Catherine Powell wanted to get her hydrocodone refilled and sent to the [facility].  She is now back with them for her pain med.  Fax sent in provider's box.  Please call her if it cannot be filled today.

## 2011-08-14 ENCOUNTER — Encounter: Payer: Self-pay | Admitting: Family Medicine

## 2011-08-14 ENCOUNTER — Ambulatory Visit (INDEPENDENT_AMBULATORY_CARE_PROVIDER_SITE_OTHER): Payer: Self-pay | Admitting: Family Medicine

## 2011-08-14 VITALS — BP 155/86 | HR 79 | Temp 96.8°F | Ht 68.5 in | Wt 375.9 lb

## 2011-08-14 DIAGNOSIS — G43919 Migraine, unspecified, intractable, without status migrainosus: Secondary | ICD-10-CM

## 2011-08-14 DIAGNOSIS — J45909 Unspecified asthma, uncomplicated: Secondary | ICD-10-CM

## 2011-08-14 DIAGNOSIS — I10 Essential (primary) hypertension: Secondary | ICD-10-CM

## 2011-08-14 DIAGNOSIS — E669 Obesity, unspecified: Secondary | ICD-10-CM

## 2011-08-14 DIAGNOSIS — M7989 Other specified soft tissue disorders: Secondary | ICD-10-CM

## 2011-08-14 MED ORDER — CARVEDILOL 6.25 MG PO TABS: 12.5000 mg | ORAL_TABLET | Freq: Two times a day (BID) | ORAL | Status: DC

## 2011-08-14 MED ORDER — FUROSEMIDE 80 MG PO TABS: 80.0000 mg | ORAL_TABLET | Freq: Two times a day (BID) | ORAL | Status: DC

## 2011-08-14 MED ORDER — LISINOPRIL 10 MG PO TABS: 10.0000 mg | ORAL_TABLET | Freq: Every day | ORAL | Status: DC

## 2011-08-14 MED ORDER — ALBUTEROL SULFATE HFA 108 (90 BASE) MCG/ACT IN AERS: 2.0000 | INHALATION_SPRAY | RESPIRATORY_TRACT | Status: DC | PRN

## 2011-08-14 MED ORDER — HYDROCHLOROTHIAZIDE 12.5 MG PO TABS: 12.5000 mg | ORAL_TABLET | Freq: Every day | ORAL | Status: DC

## 2011-08-14 MED ORDER — FORMOTEROL FUMARATE 12 MCG IN CAPS: 12.0000 ug | ORAL_CAPSULE | Freq: Two times a day (BID) | RESPIRATORY_TRACT | Status: DC

## 2011-08-14 MED ORDER — BUDESONIDE 180 MCG/ACT IN AEPB: 1.0000 | INHALATION_SPRAY | Freq: Two times a day (BID) | RESPIRATORY_TRACT | Status: DC

## 2011-08-14 MED ORDER — HYDROCODONE-ACETAMINOPHEN 5-500 MG PO TABS: 1.0000 | ORAL_TABLET | Freq: Four times a day (QID) | ORAL | Status: DC | PRN

## 2011-08-14 NOTE — Patient Instructions (Signed)
It was great to see you today! I would like you to come in for a nurse visit sometime in the next 2-3 weeks, once you are back on your blood pressure medications. I am giving you a refill on your Vicodin.  As we discussed today, a new paper prescription for this medicine can ONLY be given in an office visit with no exceptions.

## 2011-08-15 ENCOUNTER — Encounter: Payer: Self-pay | Admitting: Family Medicine

## 2011-08-15 ENCOUNTER — Telehealth: Payer: Self-pay | Admitting: Family Medicine

## 2011-08-15 MED ORDER — MELOXICAM 15 MG PO TABS: 15.0000 mg | ORAL_TABLET | Freq: Every day | ORAL | Status: DC

## 2011-08-15 MED ORDER — PROMETHAZINE HCL 25 MG RE SUPP: 25.0000 mg | Freq: Four times a day (QID) | RECTAL | Status: DC | PRN

## 2011-08-15 NOTE — Telephone Encounter (Signed)
Husband is calling because his wife was in to see Dr. Louanne Belton yesterday and thought that a Rx for Phenergan suppositories was going to be sent to [facility] on [address].  They also need a refill on Meloxicam.

## 2011-08-15 NOTE — Assessment & Plan Note (Signed)
Out of all meds now for 6 weeks.  Will restart home meds and have patient return to clinic in 2-3 weeks for bp recheck.  Titrate at that time if appropriate.

## 2011-08-15 NOTE — Assessment & Plan Note (Signed)
No progress toward goals.  Discussed this with patient who is saying she is somewhat motivated to loose weight.  Will discuss further in future appointments.

## 2011-08-15 NOTE — Assessment & Plan Note (Signed)
Patient out of all her nebs at this time and has been for some time.  Is not experiencing major symptoms today and is not having any red-flags that would suggest need to give oral steroids.  Rx given for all asthma meds.

## 2011-08-15 NOTE — Progress Notes (Signed)
Patient ID: Catherine Powell, female   DOB: 08-25-1965, 46 y.o.   MRN: [id number] Subjective: The patient is a 46 y.o. year old female who presents today for f/u multiple issues.  1. Headaches: Patient out of pain meds.  Was unable to get in for appointment due to losing orange card and not being able to afford visit.  Has now re-qualified.  Headaches continue at same intensity and frequency as previously.  (2-3 times per week.)  Has significant nausea with them.  Pain is not any more severe now than in the past.  Has some visual changes with them (aura) but this has not changed.  Still unable to afford to go back to headache clinic at [facility].  2. Asthma: Patient is out of all inhalers.  Is not currently having significant problems with wheezing and does not have any night-time awakenings but would like to be restarted to avoid problems in the future.  3. Obesity: Patient inquired about weight change from previous.  She was somewhat upset to find out that she was not any lighter.  Patient says she is trying to cut back in what she is eating.  4. Hypertension: Out of all antihypertensives.  No problems with chest pain or shortness of breath.  No current leg swelling.  Patient's past medical, social, and family history were reviewed and updated as appropriate. History  Substance Use Topics  . Smoking status: Never Smoker   . Smokeless tobacco: Never Used  . Alcohol Use: Yes   Objective:  Filed Vitals:   08/14/11 1033  BP: 155/86  Pulse: 79  Temp: 96.8 F (36 C)   Gen: NAD, morbidly obese HEENT: MMM, EOMI, PERRL CV: RRR, no murmurs Resp: No wheezes, decent air movement Ext: No edema but some chronic stasis changes  Assessment/Plan:  Please also see individual problems in problem list for problem-specific plans.

## 2011-08-15 NOTE — Assessment & Plan Note (Signed)
Not an acute issue currently but has PRN lasix. Is out of this med.  Will refill today.  Swelling most likely venous insufficiency.

## 2011-08-15 NOTE — Assessment & Plan Note (Signed)
Migraine continues at rate of 2-3 per week.  Patient reports no new symptoms or worsening of headaches.  Is out of pain meds due to loosing orange card then qualifying for it again.  Will refill standard rx (Vicodin, #90 with 3 refills) and also give rx for rectal phenergan, per patient request.

## 2011-08-15 NOTE — Telephone Encounter (Signed)
Done

## 2011-09-18 ENCOUNTER — Ambulatory Visit: Payer: Self-pay | Admitting: Family Medicine

## 2011-10-17 ENCOUNTER — Other Ambulatory Visit: Payer: Self-pay | Admitting: Family Medicine

## 2011-10-17 MED ORDER — BUDESONIDE 180 MCG/ACT IN AEPB: 1.0000 | INHALATION_SPRAY | Freq: Two times a day (BID) | RESPIRATORY_TRACT | Status: DC

## 2011-10-17 MED ORDER — ALBUTEROL SULFATE HFA 108 (90 BASE) MCG/ACT IN AERS: 2.0000 | INHALATION_SPRAY | RESPIRATORY_TRACT | Status: DC | PRN

## 2011-10-23 ENCOUNTER — Other Ambulatory Visit: Payer: Self-pay | Admitting: Family Medicine

## 2011-10-23 DIAGNOSIS — J45909 Unspecified asthma, uncomplicated: Secondary | ICD-10-CM

## 2011-10-23 MED ORDER — FORMOTEROL FUMARATE 12 MCG IN CAPS: 12.0000 ug | ORAL_CAPSULE | Freq: Two times a day (BID) | RESPIRATORY_TRACT | Status: DC

## 2011-10-25 ENCOUNTER — Other Ambulatory Visit: Payer: Self-pay | Admitting: Family Medicine

## 2011-10-25 DIAGNOSIS — J45909 Unspecified asthma, uncomplicated: Secondary | ICD-10-CM

## 2011-10-25 MED ORDER — FORMOTEROL FUMARATE 12 MCG IN CAPS: 12.0000 ug | ORAL_CAPSULE | Freq: Two times a day (BID) | RESPIRATORY_TRACT | Status: DC

## 2011-11-19 ENCOUNTER — Other Ambulatory Visit: Payer: Self-pay | Admitting: Family Medicine

## 2011-11-22 ENCOUNTER — Ambulatory Visit (INDEPENDENT_AMBULATORY_CARE_PROVIDER_SITE_OTHER): Payer: Self-pay | Admitting: Family Medicine

## 2011-11-22 ENCOUNTER — Encounter: Payer: Self-pay | Admitting: Family Medicine

## 2011-11-22 VITALS — BP 154/84 | HR 73 | Temp 97.8°F | Ht 68.5 in | Wt 377.0 lb

## 2011-11-22 DIAGNOSIS — E669 Obesity, unspecified: Secondary | ICD-10-CM

## 2011-11-22 DIAGNOSIS — G43919 Migraine, unspecified, intractable, without status migrainosus: Secondary | ICD-10-CM

## 2011-11-22 DIAGNOSIS — I10 Essential (primary) hypertension: Secondary | ICD-10-CM

## 2011-11-22 MED ORDER — PROMETHAZINE HCL 25 MG PO TABS: 25.0000 mg | ORAL_TABLET | Freq: Three times a day (TID) | ORAL | Status: DC | PRN

## 2011-11-22 MED ORDER — METOPROLOL TARTRATE 100 MG PO TABS: 100.0000 mg | ORAL_TABLET | Freq: Two times a day (BID) | ORAL | Status: DC

## 2011-11-22 MED ORDER — HYDROCODONE-ACETAMINOPHEN 5-325 MG PO TABS: 1.0000 | ORAL_TABLET | Freq: Four times a day (QID) | ORAL | Status: DC | PRN

## 2011-11-22 MED ORDER — HYDROCHLOROTHIAZIDE 25 MG PO TABS: 25.0000 mg | ORAL_TABLET | Freq: Every day | ORAL | Status: DC

## 2011-11-22 MED ORDER — LISINOPRIL 10 MG PO TABS: 10.0000 mg | ORAL_TABLET | Freq: Every day | ORAL | Status: DC

## 2011-11-22 NOTE — Assessment & Plan Note (Signed)
Stable.  No new signs/symptoms.  Will try switching from coreg to metoprolol to see if this makes any difference.  RTC in 3 months.

## 2011-11-22 NOTE — Assessment & Plan Note (Signed)
Provided positive reinforcement for minor weight loss.  It is unfortunate that she does not have insurance as many of her health problems would become easier to control if she were to loose weight.  Without insurance bariatric surgery is not an option.

## 2011-11-22 NOTE — Assessment & Plan Note (Signed)
Poorly controlled.  No concerning symptoms at this time.  Will increase HCTZ.  Patient to monitor bp closely with switch from coreg to metoprolol.  RTC if not controlled, otherwise RTC 3 months.

## 2011-11-22 NOTE — Patient Instructions (Signed)
It was good to see you today! We are changing your carvedilol to metoprolol to see if that helps some with your headaches. I am giving you a refill on your Vicodin. We will increase your HCTZ to 25mg  daily. Congratulations on your weight loss!  Keep up the good work! If your blood pressure comes down to <140/90, come back to see me in about 3 months.  Otherwise, come back in about a month so we can increase your blood pressure medication.

## 2011-11-22 NOTE — Progress Notes (Signed)
Patient ID: Catherine Powell, female   DOB: January 06, 1966, 46 y.o.   MRN: [id number] Subjective: The patient is a 46 y.o. year old female who presents today for f/u.  1. Headaches:  Still present 3-4 times per week.  Still with nausea.  No new neuro signs.  Needs refills on norco and on phenergan.  Requests oral instead of rectal due to cost.  2. HTN: Reports that she still has bp that is above goal.  No CP/SOB/DOE.  No new leg swelling.  3. Weight: Trying hard to loose weight.  Exercise 3-4 times per week, watching what she eats but has hard time as husband likes to eat fried food.  Patient's past medical, social, and family history were reviewed and updated as appropriate. History  Substance Use Topics  . Smoking status: Never Smoker   . Smokeless tobacco: Never Used  . Alcohol Use: Yes   Objective:  Filed Vitals:   11/22/11 1517  BP: 154/84  Pulse: 73  Temp: 97.8 F (36.6 C)   Gen: NAD, obese HEENT: MMM, EOMI, PERRL, TM normal bl CV: RRR, no murmurs Resp: CTABL Ext: 2+ pulses  Assessment/Plan:  Please also see individual problems in problem list for problem-specific plans.

## 2011-12-31 ENCOUNTER — Encounter: Payer: Self-pay | Admitting: Family Medicine

## 2012-01-10 ENCOUNTER — Encounter: Payer: Self-pay | Admitting: Family Medicine

## 2012-01-10 ENCOUNTER — Ambulatory Visit (INDEPENDENT_AMBULATORY_CARE_PROVIDER_SITE_OTHER): Payer: Self-pay | Admitting: Family Medicine

## 2012-01-10 VITALS — BP 150/82 | HR 74 | Temp 98.6°F | Ht 68.5 in | Wt 376.5 lb

## 2012-01-10 DIAGNOSIS — I10 Essential (primary) hypertension: Secondary | ICD-10-CM

## 2012-01-10 DIAGNOSIS — E669 Obesity, unspecified: Secondary | ICD-10-CM

## 2012-01-10 DIAGNOSIS — E7800 Pure hypercholesterolemia, unspecified: Secondary | ICD-10-CM

## 2012-01-10 DIAGNOSIS — G43919 Migraine, unspecified, intractable, without status migrainosus: Secondary | ICD-10-CM

## 2012-01-10 DIAGNOSIS — L301 Dyshidrosis [pompholyx]: Secondary | ICD-10-CM

## 2012-01-10 LAB — CBC
HCT: 38.6 % (ref 36.0–46.0)
MCHC: 32.9 g/dL (ref 30.0–36.0)
MCV: 82.5 fL (ref 78.0–100.0)
RDW: 14.1 % (ref 11.5–15.5)

## 2012-01-10 MED ORDER — TRIAMCINOLONE ACETONIDE 0.1 % EX CREA: TOPICAL_CREAM | Freq: Two times a day (BID) | CUTANEOUS | Status: DC

## 2012-01-10 MED ORDER — HYDROCODONE-ACETAMINOPHEN 5-325 MG PO TABS: 1.0000 | ORAL_TABLET | Freq: Four times a day (QID) | ORAL | Status: DC | PRN

## 2012-01-10 MED ORDER — LISINOPRIL 20 MG PO TABS: 20.0000 mg | ORAL_TABLET | Freq: Every day | ORAL | Status: DC

## 2012-01-10 NOTE — Assessment & Plan Note (Signed)
This may be cause of patient's dry skin.  Will give moderate potency steroid and continue moisturizers.

## 2012-01-10 NOTE — Assessment & Plan Note (Signed)
Recheck today. 

## 2012-01-10 NOTE — Progress Notes (Signed)
Patient ID: Catherine Powell, female   DOB: January 17, 1966, 46 y.o.   MRN: [id number] SUBJECTIVE:  46 y.o. female for annual routine Pap and checkup. Current Outpatient Prescriptions  Medication Sig Dispense Refill  . albuterol (VENTOLIN HFA) 108 (90 BASE) MCG/ACT inhaler Inhale 2 puffs into the lungs every 4 (four) hours as needed for wheezing.  3.7 g  3  . budesonide (PULMICORT) 180 MCG/ACT inhaler Inhale 1 puff into the lungs 2 (two) times daily.  1 Inhaler  11  . esomeprazole (NEXIUM) 40 MG capsule Take 1 capsule (40 mg total) by mouth daily before breakfast.  30 capsule  11  . formoterol (FORADIL) 12 MCG capsule for inhaler Place 1 capsule (12 mcg total) into inhaler and inhale 2 (two) times daily.  60 capsule  12  . furosemide (LASIX) 80 MG tablet Take 1 tablet (80 mg total) by mouth 2 (two) times daily.  60 tablet  1  . hydrochlorothiazide (HYDRODIURIL) 25 MG tablet Take 1 tablet (25 mg total) by mouth daily.  90 tablet  3  . HYDROcodone-acetaminophen (NORCO/VICODIN) 5-325 MG per tablet Take 1 tablet by mouth every 6 (six) hours as needed for pain. Do not fill until Mar 23, 2012  90 tablet  3  . lisinopril (PRINIVIL,ZESTRIL) 10 MG tablet Take 1 tablet (10 mg total) by mouth daily.  90 tablet  3  . meloxicam (MOBIC) 15 MG tablet Take 1 tablet (15 mg total) by mouth daily.  30 tablet  6  . metoprolol (LOPRESSOR) 100 MG tablet Take 1 tablet (100 mg total) by mouth 2 (two) times daily.  180 tablet  3  . olopatadine (PATANOL) 0.1 % ophthalmic solution Place 1 drop into both eyes 2 (two) times daily.  5 mL  6  . promethazine (PHENERGAN) 25 MG suppository Place 1 suppository (25 mg total) rectally every 6 (six) hours as needed for nausea.  12 each  3  . promethazine (PHENERGAN) 25 MG tablet Take 1 tablet (25 mg total) by mouth every 8 (eight) hours as needed for nausea.  40 tablet  0  . Spacer/Aero-Holding Chambers (AEROCHAMBER PLUS) inhaler 1 each by Other route once. Use as instructed.use with  albuterol and pulmicort  Dx: Asthma 493.0        Dry Skin: Has tried Eucerin, Corell, Vasoline, and Avino.    Has eye exam scheduled in 4 months.  Last mamo was several years ago  Allergies: Tolectin ds  No LMP recorded.  ROS:  Feeling well. No dyspnea or chest pain on exertion.  No abdominal pain, change in bowel habits, black or bloody stools.  No urinary tract symptoms. GYN ROS: she complains of irregular periods x1 year. No new neurological complaints.  Headaches 3-4x per week.  (No change from her normal)  OBJECTIVE:  The patient appears well, alert, oriented x 3, in no distress. BP 150/82  Pulse 74  Temp 98.6 F (37 C) (Oral)  Ht 5' 8.5" (1.74 m)  Wt 376 lb 8 oz (170.779 kg)  BMI 56.41 kg/m2 ENT normal.  Neck supple. No adenopathy or thyromegaly. PERLA. Lungs are clear, good air entry, no wheezes, rhonchi or rales. S1 and S2 normal, no murmurs, regular rate and rhythm. Abdomen soft without tenderness, guarding, mass or organomegaly. Extremities show no edema, normal peripheral pulses. Neurological is normal, no focal findings.   ASSESSMENT:  well woman  PLAN:  mammogram additional lab tests per orders See additional problems list documentation.

## 2012-01-10 NOTE — Assessment & Plan Note (Signed)
Persistently hypertensive.  Increase lisinopril today.

## 2012-01-10 NOTE — Assessment & Plan Note (Signed)
Refill given to be filled Jan 2014.

## 2012-01-10 NOTE — Patient Instructions (Signed)
It was good to see you today! You are due for your next pap smear in January of 2015. Schedule your mammogram sometime in the next several months. I would recommend continuing to use Vaseline 2-3 times per day and make sure to use it right after you shower. Also try using a humidifier in your bedroom. I am giving you a steroid cream to try as well.

## 2012-01-11 LAB — BASIC METABOLIC PANEL
CO2: 29 mEq/L (ref 19–32)
Calcium: 9.4 mg/dL (ref 8.4–10.5)
Creat: 0.78 mg/dL (ref 0.50–1.10)
Glucose, Bld: 79 mg/dL (ref 70–99)

## 2012-01-15 ENCOUNTER — Encounter: Payer: Self-pay | Admitting: Family Medicine

## 2012-06-15 ENCOUNTER — Other Ambulatory Visit: Payer: Self-pay | Admitting: Family Medicine

## 2012-06-23 ENCOUNTER — Ambulatory Visit: Payer: No Typology Code available for payment source | Admitting: Family Medicine

## 2012-06-30 ENCOUNTER — Ambulatory Visit: Payer: No Typology Code available for payment source | Admitting: Family Medicine

## 2012-06-30 ENCOUNTER — Telehealth: Payer: Self-pay | Admitting: Family Medicine

## 2012-06-30 ENCOUNTER — Ambulatory Visit (INDEPENDENT_AMBULATORY_CARE_PROVIDER_SITE_OTHER): Payer: No Typology Code available for payment source | Admitting: Family Medicine

## 2012-06-30 ENCOUNTER — Other Ambulatory Visit: Payer: Self-pay | Admitting: Family Medicine

## 2012-06-30 ENCOUNTER — Encounter: Payer: Self-pay | Admitting: Family Medicine

## 2012-06-30 VITALS — BP 138/80 | HR 78 | Temp 97.8°F | Ht 68.5 in | Wt 381.6 lb

## 2012-06-30 DIAGNOSIS — A084 Viral intestinal infection, unspecified: Secondary | ICD-10-CM

## 2012-06-30 DIAGNOSIS — A088 Other specified intestinal infections: Secondary | ICD-10-CM

## 2012-06-30 MED ORDER — PROMETHAZINE HCL 25 MG PO TABS: 25.0000 mg | ORAL_TABLET | Freq: Three times a day (TID) | ORAL | Status: DC | PRN

## 2012-06-30 MED ORDER — HYDROCODONE-ACETAMINOPHEN 5-325 MG PO TABS: 1.0000 | ORAL_TABLET | Freq: Four times a day (QID) | ORAL | Status: DC | PRN

## 2012-06-30 MED ORDER — MELOXICAM 15 MG PO TABS: 15.0000 mg | ORAL_TABLET | Freq: Every day | ORAL | Status: DC

## 2012-06-30 NOTE — Telephone Encounter (Signed)
Patient calling the emergency line stating that she has had some diarrhea and fever in the last coule days. The efver and diarrhea have since resolved but this morning she woke up with muscle aches and nausea. She would like to know if it's normal. I told ehr it could be part of the viral infection she has, but that she should be evaluated at the clinic as a walk in appointment this morning. She agreed.   Marena Chancy, PGY-2 Family Medicine Resident

## 2012-06-30 NOTE — Progress Notes (Signed)
  Subjective:    Patient ID: Catherine Powell, female    DOB: [birth year], 47 y.o.   MRN: [id number]  HPIWork in appt  2 days of nausea, vomiting, now with body aches  Multiple episodes of vomiting, diarrhea, no blood, bile.  Has been taking baby apsirin every 6 hours with some relief for several hours  Some hot and cold flashes.  Seemed fever has broken alstn ight.  Able to toelrate clear liquids  Review of Systems No dyspnea, cough     Objective:   Physical Exam GEN: Alert & Oriented, No acute distress HEENT: [place]/AT. EOMI, PERRLA, no conjunctival injection or scleral icterus.Oropharynx is without erythema or exudates.   CV:  Regular Rate & Rhythm, no murmur Respiratory:  Normal work of breathing, CTAB Abd:  + BS, soft, no tenderness to palpation Ext: no pre-tibial edema        Assessment & Plan:

## 2012-06-30 NOTE — Patient Instructions (Addendum)
Uses 325 mg aspirin instead of baby aspiring for aches and pains  Phenergan for nasuea  Viral Gastroenteritis Viral gastroenteritis is also called stomach flu. This illness is caused by a certain type of germ (virus). It can cause sudden watery poop (diarrhea) and throwing up (vomiting). This can cause you to lose body fluids (dehydration). This illness usually lasts for 3 to 8 days. It usually goes away on its own. HOME CARE   Drink enough fluids to keep your pee (urine) clear or pale yellow. Drink small amounts of fluids often.  Ask your doctor how to replace body fluid losses (rehydration).  Avoid:  Foods high in sugar.  Alcohol.  Bubbly (carbonated) drinks.  Tobacco.  Juice.  Caffeine drinks.  Very hot or cold fluids.  Fatty, greasy foods.  Eating too much at one time.  Dairy products until 24 to 48 hours after your watery poop stops.  You may eat foods with active cultures (probiotics). They can be found in some yogurts and supplements.  Wash your hands well to avoid spreading the illness.  Only take medicines as told by your doctor. Do not give aspirin to children. Do not take medicines for watery poop (antidiarrheals).  Ask your doctor if you should keep taking your regular medicines.  Keep all doctor visits as told. GET HELP RIGHT AWAY IF:   You cannot keep fluids down.  You do not pee at least once every 6 to 8 hours.  You are short of breath.  You see blood in your poop or throw up. This may look like coffee grounds.  You have belly (abdominal) pain that gets worse or is just in one small spot (localized).  You keep throwing up or having watery poop.  You have a fever.  The patient is a child younger than 3 months, and he or she has a fever.  The patient is a child older than 3 months, and he or she has a fever and problems that do not go away.  The patient is a child older than 3 months, and he or she has a fever and problems that suddenly get  worse.  The patient is a baby, and he or she has no tears when crying. MAKE SURE YOU:   Understand these instructions.  Will watch your condition.  Will get help right away if you are not doing well or get worse. Document Released: 08/14/2007 Document Revised: 05/20/2011 Document Reviewed: 12/12/2010 [facility] Patient Information 2013 [place], [place].

## 2012-06-30 NOTE — Assessment & Plan Note (Signed)
Supportive care, with aspirin for pain and phenergan.  Discussed red flags for follow-up.  Work note written.

## 2012-07-03 ENCOUNTER — Ambulatory Visit (INDEPENDENT_AMBULATORY_CARE_PROVIDER_SITE_OTHER): Payer: No Typology Code available for payment source | Admitting: Family Medicine

## 2012-07-03 ENCOUNTER — Encounter: Payer: Self-pay | Admitting: Family Medicine

## 2012-07-03 VITALS — BP 152/85 | HR 76 | Temp 97.5°F | Ht 68.5 in | Wt 379.9 lb

## 2012-07-03 DIAGNOSIS — M1710 Unilateral primary osteoarthritis, unspecified knee: Secondary | ICD-10-CM

## 2012-07-03 DIAGNOSIS — I10 Essential (primary) hypertension: Secondary | ICD-10-CM

## 2012-07-03 DIAGNOSIS — M7989 Other specified soft tissue disorders: Secondary | ICD-10-CM

## 2012-07-03 DIAGNOSIS — J45909 Unspecified asthma, uncomplicated: Secondary | ICD-10-CM

## 2012-07-03 DIAGNOSIS — E669 Obesity, unspecified: Secondary | ICD-10-CM

## 2012-07-03 MED ORDER — FUROSEMIDE 80 MG PO TABS: 80.0000 mg | ORAL_TABLET | Freq: Two times a day (BID) | ORAL | Status: DC

## 2012-07-03 MED ORDER — BUDESONIDE 180 MCG/ACT IN AEPB: 1.0000 | INHALATION_SPRAY | Freq: Two times a day (BID) | RESPIRATORY_TRACT | Status: DC

## 2012-07-03 MED ORDER — FORMOTEROL FUMARATE 12 MCG IN CAPS: 12.0000 ug | ORAL_CAPSULE | Freq: Two times a day (BID) | RESPIRATORY_TRACT | Status: DC

## 2012-07-03 MED ORDER — HYDROCHLOROTHIAZIDE 25 MG PO TABS: 25.0000 mg | ORAL_TABLET | Freq: Every day | ORAL | Status: DC

## 2012-07-03 MED ORDER — DICLOFENAC SODIUM 75 MG PO TBEC: 75.0000 mg | DELAYED_RELEASE_TABLET | Freq: Two times a day (BID) | ORAL | Status: DC

## 2012-07-03 NOTE — Assessment & Plan Note (Signed)
Discussed weight management options with patient.  Am willing to have some visits specifically dedicated to this but pt not interested at this time.  Pt appreciates support.

## 2012-07-03 NOTE — Assessment & Plan Note (Signed)
Switch from mobic to diclofenac for 1 month to see if BID dosing will be more helpful.

## 2012-07-03 NOTE — Assessment & Plan Note (Signed)
BP only mildly elevated.  Will go ahead and d/c lisinopril and metoprolol.  Cont HCTZ, rtc 1 month for bp recheck

## 2012-07-03 NOTE — Progress Notes (Signed)
Patient ID: Catherine Powell, female   DOB: 12/15/1965, 47 y.o.   MRN: [id number] Subjective: The patient is a 47 y.o. year old female who presents today for f/u.  1. HTN: Past several bp have been normal.  Out of meds x1 month.  No cp/sob/doe.  Does have chronic LE edema.  2. OA of knees: Taking mobic daily.  Helps for about 8-9 hours.  Then seems to wear off.  Wondering if there is a BID option.  3. Weight: Pt is very vocal about trying to loose weight this summer.  Is motivated by what she sees as decreasing health of husband related to smoking and weight.  Says she feels confidant she can make necessary dietary changes.  Patient's past medical, social, and family history were reviewed and updated as appropriate. History  Substance Use Topics  . Smoking status: Never Smoker   . Smokeless tobacco: Never Used  . Alcohol Use: Yes   Objective:  Filed Vitals:   07/03/12 1402  BP: 152/85  Pulse: 76  Temp: 97.5 F (36.4 C)   Gen: Morbidly obese, NAD Ext: Chronic venous stasis  Assessment/Plan:  Please also see individual problems in problem list for problem-specific plans.

## 2012-07-31 ENCOUNTER — Ambulatory Visit (INDEPENDENT_AMBULATORY_CARE_PROVIDER_SITE_OTHER): Payer: No Typology Code available for payment source | Admitting: Family Medicine

## 2012-07-31 VITALS — BP 148/78 | HR 70 | Temp 98.0°F | Ht 68.5 in | Wt 380.0 lb

## 2012-07-31 DIAGNOSIS — I10 Essential (primary) hypertension: Secondary | ICD-10-CM

## 2012-07-31 MED ORDER — LISINOPRIL-HYDROCHLOROTHIAZIDE 20-25 MG PO TABS: 1.0000 | ORAL_TABLET | Freq: Every day | ORAL | Status: DC

## 2012-08-12 ENCOUNTER — Encounter: Payer: Self-pay | Admitting: Family Medicine

## 2012-08-12 NOTE — Assessment & Plan Note (Signed)
Will add lisinopril to HCTZ.  RTC 2-3 weeks for bp recheck.

## 2012-08-12 NOTE — Progress Notes (Signed)
Patient ID: Catherine Powell, female   DOB: 1965/09/14, 47 y.o.   MRN: [id number] Subjective: The patient is a 47 y.o. year old female who presents today for bp f/u.  Taking increased dose of HCTZ without problems.  No cp/sob/doe/new  LE edema.  Not monitoring bp at home.  Patient's past medical, social, and family history were reviewed and updated as appropriate. History  Substance Use Topics  . Smoking status: Never Smoker   . Smokeless tobacco: Never Used  . Alcohol Use: Yes   Objective:  Filed Vitals:   07/31/12 1402  BP: 148/78  Pulse: 70  Temp: 98 F (36.7 C)   Gen: NAD, morbidly obese CV: RRR Resp: CTABL Ext: 1+ pitting edema,  Chronic venous stasis changes  Assessment/Plan:  Please also see individual problems in problem list for problem-specific plans.

## 2012-10-14 ENCOUNTER — Ambulatory Visit: Payer: No Typology Code available for payment source | Admitting: Sports Medicine

## 2012-10-23 ENCOUNTER — Ambulatory Visit (INDEPENDENT_AMBULATORY_CARE_PROVIDER_SITE_OTHER): Payer: Self-pay | Admitting: Sports Medicine

## 2012-10-23 ENCOUNTER — Encounter: Payer: Self-pay | Admitting: Sports Medicine

## 2012-10-23 VITALS — BP 156/71 | HR 75 | Temp 98.1°F | Ht 68.5 in | Wt 380.0 lb

## 2012-10-23 DIAGNOSIS — E7800 Pure hypercholesterolemia, unspecified: Secondary | ICD-10-CM

## 2012-10-23 DIAGNOSIS — G894 Chronic pain syndrome: Secondary | ICD-10-CM

## 2012-10-23 DIAGNOSIS — G43919 Migraine, unspecified, intractable, without status migrainosus: Secondary | ICD-10-CM

## 2012-10-23 DIAGNOSIS — I10 Essential (primary) hypertension: Secondary | ICD-10-CM

## 2012-10-23 DIAGNOSIS — E669 Obesity, unspecified: Secondary | ICD-10-CM

## 2012-10-23 MED ORDER — PROMETHAZINE HCL 25 MG PO TABS: 25.0000 mg | ORAL_TABLET | Freq: Three times a day (TID) | ORAL | Status: DC | PRN

## 2012-10-23 MED ORDER — HYDROCODONE-ACETAMINOPHEN 5-325 MG PO TABS: 1.0000 | ORAL_TABLET | Freq: Four times a day (QID) | ORAL | Status: DC | PRN

## 2012-10-23 MED ORDER — ALBUTEROL SULFATE HFA 108 (90 BASE) MCG/ACT IN AERS: 2.0000 | INHALATION_SPRAY | RESPIRATORY_TRACT | Status: DC | PRN

## 2012-10-23 MED ORDER — METOPROLOL TARTRATE 100 MG PO TABS: 100.0000 mg | ORAL_TABLET | Freq: Two times a day (BID) | ORAL | Status: DC

## 2012-10-23 MED ORDER — QUINAPRIL-HYDROCHLOROTHIAZIDE 10-12.5 MG PO TABS: 1.0000 | ORAL_TABLET | Freq: Every day | ORAL | Status: DC

## 2012-10-23 NOTE — Assessment & Plan Note (Signed)
Patient will need new chronic pain contract signed at next visit. Given likelihood of significant OHS discussed risks of continue hydrocodone.  This will need continued be redressed and should be used to encourage the patient to make appropriate medical decisions. >Will need to obtain a quantitative urine drug screen in future to ensure compliance

## 2012-10-23 NOTE — Progress Notes (Signed)
  [facility]  Patient name: Catherine Powell MRN [id number]  Date of birth: 09-10-1965  CC & HPI:  Catherine Powell is a 47 y.o. female presenting to clinic.  Chief Complaint  Patient presents with  . Medical Managment of Chronic Issues    difficutly with Lisinopril/HCTZ,  Patient presents today for followup of her high blood pressure, obesity and hypercholesterolemia. She reports having stopped taking her lisinopril HCTZ because she was having dizziness with this.  She denies any chest pain, shortness of breath or dyspnea on exertion that is any different than past.  She denies any orthopnea, PND but does have significant snoring and poor distorted sleep due to her breathing.    . Pain    Opioids per dr Louanne Belton previously for migraines and B knee OA She has been on chronic opioids for quite some time and reports that this is the only thing that has helped her chronic migraines.  She has also been on a beta blocker .  These medications are well tolerated and helps her function on a daily basis.      ROS:  PER HPI  Pertinent History Reviewed:  Medical & Surgical Hx:  Reviewed: Significant for depression, cervical radiculopathy Medications: Reviewed & Updated - see associated section Social History: Reviewed -  reports that she has never smoked. She has never used smokeless tobacco.  Objective Findings:  Vitals: BP 156/71  Pulse 75  Temp(Src) 98.1 F (36.7 C) (Oral)  Ht 5' 8.5" (1.74 m)  Wt 380 lb (172.367 kg)  BMI 56.93 kg/m2 PE: GENERAL:  morbidly obese Caucasian female. In no discomfort; no respiratory distress  PSYCH:  alert and appropriate, limited insight   HNEENT:    CARDIO:  RRR, S1/S2 heard, no murmur  LUNGS:  CTA B, no wheezes, no crackles, poor respiratory effort   ABDOMEN:   morbidly obese   EXTREM:  moves all 4 extremity spontaneously, no lateralization, no edema but morbidly obese   GU:   SKIN:   NEUROMSK:     Assessment &  Plan:   1. ESSENTIAL HYPERTENSION   2. MIGRAINE NOS W/INTRACTABLE MIGRAINE   3. OBESITY, NOS   4. HYPERCHOLESTEROLEMIA    See problem associated charting

## 2012-10-23 NOTE — Assessment & Plan Note (Addendum)
Refilled Hydrocodone today.  Will need to see back in 3 months

## 2012-10-23 NOTE — Assessment & Plan Note (Addendum)
Restart BP medications Metoprolol & Accuretic rx printed to take to [facility] Discussed therapeutic lifestyle intervention.  Spent significant time in discussing likely sleep disorder contributing to her overall clinical picture.  Highly encouraged sleep study the patient adamantly declines.

## 2012-10-23 NOTE — Patient Instructions (Signed)
It was nice to see you today, thanks for coming in!  Problem List Items Addressed This Visit   OBESITY, NOS     Consider Sleep study to assess for OSA vs OHS Continue Therapeutic Lifestyle interventions > will discuss at next visit     MIGRAINE NOS W/INTRACTABLE MIGRAINE     Refilled Hydrocodone today.  Will need to see back in 3 months     Relevant Medications      ACCURETIC 10-12.5 MG PO TABS      HYDROcodone-acetaminophen (NORCO/VICODIN) 5-325 MG per tablet      promethazine (PHENERGAN)  tablet      metoprolol (LOPRESSOR) tablet   HYPERCHOLESTEROLEMIA     His fasting lipid panel. Will return once orange card is obtained. Consider statin use given risk factors.    Relevant Medications      ACCURETIC 10-12.5 MG PO TABS      metoprolol (LOPRESSOR) tablet   ESSENTIAL HYPERTENSION - Primary     Restart BP medications Metoprolol & Accuretic rx printed to take to [facility]    Relevant Medications      ACCURETIC 10-12.5 MG PO TABS      metoprolol (LOPRESSOR) tablet       Please plan to return to see me in 3 months.  If you need anything prior to your next visit please call the clinic.  Please Bring all medications or accurate medication list with you to each appointment; an accurate medication list is essential in providing you the best care possible.

## 2012-10-23 NOTE — Assessment & Plan Note (Signed)
Consider Sleep study to assess for OSA vs OHS Continue Therapeutic Lifestyle interventions > will discuss at next visit

## 2012-10-23 NOTE — Assessment & Plan Note (Signed)
His fasting lipid panel. Will return once orange card is obtained. Consider statin use given risk factors.

## 2012-11-23 ENCOUNTER — Ambulatory Visit: Payer: Self-pay | Admitting: Sports Medicine

## 2012-12-14 ENCOUNTER — Encounter: Payer: Self-pay | Admitting: Sports Medicine

## 2012-12-14 ENCOUNTER — Ambulatory Visit (INDEPENDENT_AMBULATORY_CARE_PROVIDER_SITE_OTHER): Payer: No Typology Code available for payment source | Admitting: Sports Medicine

## 2012-12-14 VITALS — BP 148/77 | HR 75 | Temp 97.6°F | Ht 68.5 in | Wt 371.8 lb

## 2012-12-14 DIAGNOSIS — I10 Essential (primary) hypertension: Secondary | ICD-10-CM

## 2012-12-14 DIAGNOSIS — Z23 Encounter for immunization: Secondary | ICD-10-CM

## 2012-12-14 DIAGNOSIS — J309 Allergic rhinitis, unspecified: Secondary | ICD-10-CM

## 2012-12-14 DIAGNOSIS — G894 Chronic pain syndrome: Secondary | ICD-10-CM

## 2012-12-14 DIAGNOSIS — M25559 Pain in unspecified hip: Secondary | ICD-10-CM

## 2012-12-14 DIAGNOSIS — E669 Obesity, unspecified: Secondary | ICD-10-CM

## 2012-12-14 DIAGNOSIS — K219 Gastro-esophageal reflux disease without esophagitis: Secondary | ICD-10-CM

## 2012-12-14 DIAGNOSIS — M25552 Pain in left hip: Secondary | ICD-10-CM

## 2012-12-14 DIAGNOSIS — G43919 Migraine, unspecified, intractable, without status migrainosus: Secondary | ICD-10-CM

## 2012-12-14 MED ORDER — MELOXICAM 15 MG PO TABS: 15.0000 mg | ORAL_TABLET | Freq: Every day | ORAL | Status: DC

## 2012-12-14 MED ORDER — ESOMEPRAZOLE MAGNESIUM 40 MG PO CPDR: 40.0000 mg | DELAYED_RELEASE_CAPSULE | Freq: Every day | ORAL | Status: DC

## 2012-12-14 MED ORDER — DICLOFENAC SODIUM 75 MG PO TBEC: 75.0000 mg | DELAYED_RELEASE_TABLET | Freq: Two times a day (BID) | ORAL | Status: DC

## 2012-12-14 MED ORDER — HYDROCODONE-ACETAMINOPHEN 5-325 MG PO TABS: 1.0000 | ORAL_TABLET | Freq: Four times a day (QID) | ORAL | Status: DC | PRN

## 2012-12-14 NOTE — Progress Notes (Signed)
[facility] CHARLES NIESE - 47 y.o. female MRN [id number]  Date of birth: 1965/07/11  CC, HPI, Interval History & ROS  Catherine Powell presents today for Medication Refill and Hip Pain . Catherine Powell is here today to followup on   her chronic medical conditions including hypertension, GERD, chronic pain.  She reports overall she feels that she is at her baseline but is constantly in pain.  Constant headaches, constantly fatigued, difficulty losing weight but has lost 9 pounds.  Pt denies chest pain, dyspnea at rest or exertion, PND, lower extremity edema.  Patient denies any facial asymmetry, unilateral weakness, or dysarthria.  She reports her GERD is poorly controlled and she is not on her reflux medication.  She is insistent on continuing NSAIDs because they do help her knee pain in addition to her chronic pain medicine.  Patient reports sleeping poorly.  She is constantly tired during the day.  She is not interested in having a sleep study in spite of significant counseling.  She reports her left hip has been hurting her.  Worse while laying on her side at night.  Worse with any type of motion.  Radiating to her groin.  # [facility] Compliance Diet: probably noncompliant though I cannot elicit that specific history  Exercise: probably noncompliant though I cannot elicit that specific history  MEDS: noncompliant much of the time   Pertinent History & Care Coordination   Catherine Powell's major active medical problems include: # CVD: HTN, HLD, obesity no known prior coronary artery disease or CVA - Nonischemic nuclear ETT in Oct 2008 - No prior sleep study # Respiratory: Mild persistent asthma - no documented PFTs - On Pulmicort, formoterol, Ventolin. # Chronic pain, chronic migraines, MSK:  has been seen by [facility] previously.   - Only medications that have seemed to help her chronic pain medications.   - Has known knee osteoarthritis   Other Pertinent  Med/Surg/Hosp History: # GERD  # no documented surgeries  Follow up Issues:  Update history further F/u Resp Meds   No health maintenance topics applied.  History   Social History  . Marital Status: Married    Spouse Name: [name]    Number of Children: [name]  . Years of Education: [name]   Social History Main Topics  . Smoking status: Never Smoker   . Smokeless tobacco: Never Used  . Alcohol Use: Yes  . Drug Use: No  . Sexual Activity: Yes   Other Topics Concern  . None   Social History Narrative  . None    Otherwise please see associated EMR sections for complete problem List, past medical history, past surgical history, family history and social history. Objective Findings  VITALS: HR: 75 bpm  BP: 148/77 mmHg  TEMP: 97.6 F (36.4 C) (Oral)  RESP:   HT: 5' 8.5" (174 cm)  WT: 371 lb 12.8 oz (168.647 kg)  BMI: Body mass index is 55.7 kg/(m^2).   BP Readings from Last 3 Encounters:  12/14/12 148/77  10/23/12 156/71  07/31/12 148/78   Wt Readings from Last 3 Encounters:  12/14/12 371 lb 12.8 oz (168.647 kg)  10/23/12 380 lb (172.367 kg)  07/31/12 380 lb (172.367 kg)     PHYSICAL EXAM: GENERAL:  adult obese female. In no discomfort; no respiratory distress  PSYCH: alert and appropriate, moderate insight   HNEENT:   CARDIO: RRR, S1/S2 heard, no murmur  LUNGS: CTA B, no wheezes, no crackles  ABDOMEN:   EXTREM:  Warm, well perfused.  Moves all 4 extremities spontaneously; no lateralization.  No noted foot lesions.  Distal pulses intact.  No pretibial edema.  Positive left logroll.  Positive left FADIR.  Minimal tenderness to palpation over greater trochanter however significant subcutaneous tissue   GU:   SKIN:    Medications & Orders   Previous Medications   ALBUTEROL (VENTOLIN HFA) 108 (90 BASE) MCG/ACT INHALER    Inhale 2 puffs into the lungs every 4 (four) hours as needed for wheezing.   BUDESONIDE (PULMICORT) 180 MCG/ACT INHALER    Inhale 1 puff into the  lungs 2 (two) times daily.   FORMOTEROL (FORADIL) 12 MCG CAPSULE FOR INHALER    Place 1 capsule (12 mcg total) into inhaler and inhale 2 (two) times daily.   METOPROLOL (LOPRESSOR) 100 MG TABLET    Take 1 tablet (100 mg total) by mouth 2 (two) times daily.   OLOPATADINE (PATANOL) 0.1 % OPHTHALMIC SOLUTION    Place 1 drop into both eyes 2 (two) times daily.   PROMETHAZINE (PHENERGAN) 25 MG TABLET    Take 1 tablet (25 mg total) by mouth every 8 (eight) hours as needed for nausea.   QUINAPRIL-HYDROCHLOROTHIAZIDE (ACCURETIC) 10-12.5 MG PER TABLET    Take 1 tablet by mouth daily.   SPACER/AERO-HOLDING CHAMBERS (AEROCHAMBER PLUS) INHALER    1 each by Other route once. Use as instructed.use with albuterol and pulmicort  Dx: Asthma 493.0    Modified Medications   Modified Medication Previous Medication   ESOMEPRAZOLE (NEXIUM) 40 MG CAPSULE esomeprazole (NEXIUM) 40 MG capsule      Take 1 capsule (40 mg total) by mouth daily before breakfast.    Take 1 capsule (40 mg total) by mouth daily before breakfast.   HYDROCODONE-ACETAMINOPHEN (NORCO/VICODIN) 5-325 MG PER TABLET HYDROcodone-acetaminophen (NORCO/VICODIN) 5-325 MG per tablet      Take 1 tablet by mouth every 6 (six) hours as needed for pain.    Take 1 tablet by mouth every 6 (six) hours as needed for pain.   New Prescriptions   MELOXICAM (MOBIC) 15 MG TABLET    Take 1 tablet (15 mg total) by mouth daily.   Discontinued Medications   DICLOFENAC (VOLTAREN) 75 MG EC TABLET    Take 1 tablet (75 mg total) by mouth 2 (two) times daily.   Orders Placed This Encounter  Procedures  . DG Hip Complete Left    Assessment & Plan   Problems addressed today: General Instructions:  1. Unspecified essential hypertension   2. Obesity, unspecified   3. Chronic pain syndrome   4. Left hip pain   5. MIGRAINE NOS W/INTRACTABLE MIGRAINE   6. RHINITIS, ALLERGIC   7. GASTROESOPHAGEAL REFLUX, NO ESOPHAGITIS       X-ray of left hip for OA  evaluation  Recommend Sleep study, inform if interested in future  Refills for Mobic & Nexium  Due for Refills on Hydrocodone in 1 month     For further discussion of A/P and for follow up issues see problem based charting

## 2012-12-14 NOTE — Patient Instructions (Signed)
It was nice to see you today, thanks for coming in!   X-ray of left hip for OA evaluation  Recommend Sleep study, inform if interested in future  Refills for Mobic & Nexium  Due for Refills on Hydrocodone in 1 month   Please plan to return to see me in 1 month or for a greater trochanteric injection if not improved with ice and exercises.    If you need anything prior to your next visit please call the clinic. Please Bring all medications or accurate medication list with you to each appointment; an accurate medication list is essential in providing you the best care possible.

## 2012-12-16 ENCOUNTER — Encounter: Payer: Self-pay | Admitting: Sports Medicine

## 2012-12-16 NOTE — Assessment & Plan Note (Signed)
Continue PPI treatment in setting of continued NSAID use.  Discussed this with patient today and this is what she continues to wish to do

## 2012-12-16 NOTE — Assessment & Plan Note (Signed)
Will send for x-rays of the left hip to evaluate for osteoarthritis. Is negative for OA could be greater trochanteric bursitis and patient encouraged to return to clinic to discuss her results of x-ray > Consider femoral acetabular injection by IR if positive for OA

## 2012-12-16 NOTE — Assessment & Plan Note (Signed)
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  Patient is reluctant to have a sleep study performed. >50% of this 25 minute visit spent in direct patient counseling and/or coordination of care.

## 2012-12-16 NOTE — Assessment & Plan Note (Signed)
Not due for refill of medications for one month. Will need to discuss with pain advisory board given chronic opioids or not the treatment of choice for chronic migraines.

## 2012-12-22 ENCOUNTER — Ambulatory Visit (HOSPITAL_COMMUNITY)
Admission: RE | Admit: 2012-12-22 | Discharge: 2012-12-22 | Disposition: A | Discharge status: Home / Self Care | Payer: No Typology Code available for payment source | Source: Ambulatory Visit | Attending: Family Medicine | Admitting: Family Medicine

## 2012-12-22 DIAGNOSIS — M25559 Pain in unspecified hip: Secondary | ICD-10-CM | POA: Insufficient documentation

## 2012-12-22 DIAGNOSIS — M25552 Pain in left hip: Secondary | ICD-10-CM

## 2012-12-23 ENCOUNTER — Ambulatory Visit (INDEPENDENT_AMBULATORY_CARE_PROVIDER_SITE_OTHER): Payer: No Typology Code available for payment source | Admitting: Sports Medicine

## 2012-12-23 VITALS — BP 158/81 | HR 73 | Temp 97.9°F | Ht 68.5 in | Wt 371.0 lb

## 2012-12-23 DIAGNOSIS — M25552 Pain in left hip: Secondary | ICD-10-CM

## 2012-12-23 DIAGNOSIS — M179 Osteoarthritis of knee, unspecified: Secondary | ICD-10-CM

## 2012-12-23 DIAGNOSIS — M25559 Pain in unspecified hip: Secondary | ICD-10-CM

## 2012-12-23 DIAGNOSIS — M1710 Unilateral primary osteoarthritis, unspecified knee: Secondary | ICD-10-CM

## 2012-12-27 NOTE — Assessment & Plan Note (Signed)
>   Requesting B injections next visit

## 2012-12-27 NOTE — Assessment & Plan Note (Addendum)
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  After discussing merits of each type of injection pt elects for in-office GT injection today.    Left Greater Trocanteric Bursitis Therapeutic and Diagnostic Injection Performed today:  The risks, benefits, and expected outcomes of the injection were reviewed and she wishes to undergo the above named procedure.  After an appropriate time out was taken, the left lateral hip was prepped in a clean fashion and was directly injected using  an 18g 3 inch syringe using 5cc of 1% plain Lidocaine and 1mg  of DepoMedrol 80mg /mL.  A bandaid was applied to the area.  This procedure was well tolerated and there were no complications.  Pt to continue home exercises. > Consider referall to PT

## 2012-12-27 NOTE — Progress Notes (Signed)
[facility] MARCELA ALATORRE - 47 y.o. female MRN [id number]  Date of birth: [birth year]  CC, HPI, INTERVAL HISTORY & ROS  Catherine Powell is here today for left hip pain follow up.      She reports Continuing to have pain in her left lateral hip.  Still keeping her up at night.  Mobic not helping significantly  X-rays obtained and reviewed  Requesting injection  History  Past Medical, Surgical, Social, and Family History Reviewed per EMR Medications and Allergies reviewed and all updated if necessary. Objective Findings  VITALS: HR: 73 bpm  BP: 158/81 mmHg  TEMP: 97.9 F (36.6 C) (Oral)  RESP:    HT: 5' 8.5" (174 cm)  WT: 371 lb (168.284 kg)  BMI: 55.7   BP Readings from Last 3 Encounters:  12/23/12 158/81  12/14/12 148/77  10/23/12 156/71   Wt Readings from Last 3 Encounters:  12/23/12 371 lb (168.284 kg)  12/14/12 371 lb 12.8 oz (168.647 kg)  10/23/12 380 lb (172.367 kg)     PHYSICAL EXAM: GENERAL: Adult morbidly obese  female. In no discomfort; no respiratory distress  PSYCH: alert and appropriate, good insight   EXTREM:  Warm, well perfused.  Moves all 4 extremities spontaneously; no lateralization.  No noted foot lesions.  Distal pulses 2+/4.  1+ pretibial edema.  Left Hip Exam: Appear:  no skin lesions  Palp:  TTP over lateral aspect focally over posterior aspect of greater trochanter  ROM:  minimal loss of internal rotation of B hips Left worse than R  NV:   sensation grossly intact  Testing:  pain with log roll B. Pain with side compression test on left + FABER to left gluteal region FADIR with loss of ROM but not painful  X-rays reviewed today:  Noted B mild-moderate OA with osteophytes and sclerosis of B fem/acet joints   Assessment & Plan   Problems addressed today: General Plan & Pt Instructions:  1. Degenerative joint disease of knees   2. Left hip pain       Left Greater Trochanteric Injection today  Consider IR left  femoroacetabular if not improved      For further discussion of A/P and for follow up issues see problem based charting if applicable.

## 2013-01-15 ENCOUNTER — Ambulatory Visit: Payer: No Typology Code available for payment source | Admitting: Sports Medicine

## 2013-02-01 ENCOUNTER — Encounter: Payer: Self-pay | Admitting: Sports Medicine

## 2013-02-01 ENCOUNTER — Ambulatory Visit (INDEPENDENT_AMBULATORY_CARE_PROVIDER_SITE_OTHER): Payer: No Typology Code available for payment source | Admitting: Sports Medicine

## 2013-02-01 VITALS — BP 177/82 | HR 76 | Temp 98.1°F | Ht 68.5 in | Wt 373.0 lb

## 2013-02-01 DIAGNOSIS — I10 Essential (primary) hypertension: Secondary | ICD-10-CM

## 2013-02-01 DIAGNOSIS — G43919 Migraine, unspecified, intractable, without status migrainosus: Secondary | ICD-10-CM

## 2013-02-01 DIAGNOSIS — Z299 Encounter for prophylactic measures, unspecified: Secondary | ICD-10-CM

## 2013-02-01 DIAGNOSIS — E7800 Pure hypercholesterolemia, unspecified: Secondary | ICD-10-CM

## 2013-02-01 DIAGNOSIS — R109 Unspecified abdominal pain: Secondary | ICD-10-CM

## 2013-02-01 LAB — LIPID PANEL
HDL: 45 mg/dL (ref >39)
LDL Cholesterol: 125 mg/dL — ABNORMAL HIGH (ref 0–99)
Total CHOL/HDL Ratio: 4.4 Ratio
Triglycerides: 142 mg/dL (ref <150)
VLDL: 28 mg/dL (ref 0–40)

## 2013-02-01 LAB — CBC
HCT: 37.8 % (ref 36.0–46.0)
Hemoglobin: 12.6 g/dL (ref 12.0–15.0)
MCH: 27.4 pg (ref 26.0–34.0)
MCHC: 33.3 g/dL (ref 30.0–36.0)
Platelets: 325 10*3/uL (ref 150–400)
RBC: 4.6 MIL/uL (ref 3.87–5.11)

## 2013-02-01 LAB — POCT URINALYSIS DIPSTICK
Blood, UA: NEGATIVE
Ketones, UA: NEGATIVE
Protein, UA: NEGATIVE
Spec Grav, UA: 1.015
Urobilinogen, UA: 0.2

## 2013-02-01 LAB — COMPREHENSIVE METABOLIC PANEL
ALT: 21 U/L (ref 0–35)
AST: 16 U/L (ref 0–37)
Alkaline Phosphatase: 65 U/L (ref 39–117)
Chloride: 100 mEq/L (ref 96–112)
Creat: 0.65 mg/dL (ref 0.50–1.10)
Total Bilirubin: 0.3 mg/dL (ref 0.3–1.2)

## 2013-02-01 LAB — POCT UA - MICROSCOPIC ONLY

## 2013-02-01 MED ORDER — HYDROCODONE-ACETAMINOPHEN 5-325 MG PO TABS: 1.0000 | ORAL_TABLET | Freq: Four times a day (QID) | ORAL | Status: DC | PRN

## 2013-02-01 NOTE — Assessment & Plan Note (Signed)
Patient has had a kidney stone previously and feels this is likely related.  Given handout. Encourage hydration. > Consider urology followup if persistent. > Consider SI joint dysfunction as well the patient asymptomatic at this time.

## 2013-02-01 NOTE — Assessment & Plan Note (Signed)
Refill hydrocodone, discussed relative contraindications for this medication for this pain purpose.  Seems to have had a prior thorough workup and this has been the only medication that seems to work.

## 2013-02-01 NOTE — Assessment & Plan Note (Signed)
CMET, CBC, TSH, VIT D, Lipid

## 2013-02-01 NOTE — Progress Notes (Signed)
  Catherine Powell - 47 y.o. female MRN [id number]  Date of birth: 02-Jun-1965  CC, HPI, INTERVAL HISTORY & ROS  Catherine Powell is here today for an acute visit R sided flank pain.    She's also here for refills on her pain medication for her chronic migraines and no bilateral knee and hip are arthritis.  She  denies any medication side effects.  Medication continues to function on a regular basis.    She reports started Nov 8, with r sided flank pain  Sudden onset, described as catch in side.  Couldn't get comfortable.  Pain for 5-6 days.  Have had kidney stones in the past.  Never seen a urologist.  Did not see a stone.  Heat helped.  No new medications.  She has run out of multiple of her medications including blood pressure medicines.  Pt denies chest pain, dyspnea at rest or exertion, PND, lower extremity edema.  Patient denies any facial asymmetry, unilateral weakness, or dysarthria.  Reports her breathing is doing well  Patient is not interested in having a sleep study performed but does report snoring and daytime somnolence  History  Catherine Powell's major active medical problems include: # CVD: HTN, HLD, obesity no known prior coronary artery disease or CVA - Nonischemic nuclear ETT in Oct 2008 - No prior sleep study # Respiratory: Mild persistent asthma - no documented PFTs - On Pulmicort, formoterol, Ventolin. # Chronic pain, chronic migraines, MSK:  has been seen by [facility] headache clinic previously.   - Only medications that have seemed to help her chronic pain medications.   - Has known bilateral knee & hip osteoarthritis   Other Pertinent Med/Surg/Hosp History: # GERD  # no documented surgeries  Follow up Issues:  Update history further F/u Resp Meds   Past Medical, Surgical, Social, and Family History Reviewed per EMR Medications and Allergies reviewed and all updated if necessary. Objective Findings  VITALS: HR: 76 bpm  BP: 177/82 mmHg  TEMP: 98.1 F (36.7 C)  (Oral)  RESP:    HT: 5' 8.5" (174 cm)  WT: 373 lb (169.192 kg)  BMI: 56   BP Readings from Last 3 Encounters:  02/01/13 177/82  12/23/12 158/81  12/14/12 148/77   Wt Readings from Last 3 Encounters:  02/01/13 373 lb (169.192 kg)  12/23/12 371 lb (168.284 kg)  12/14/12 371 lb 12.8 oz (168.647 kg)     PHYSICAL EXAM: GENERAL:  morbidly obese Caucasian female. In no discomfort; no respiratory distress  PSYCH: alert and appropriate, good insight   HNEENT:   CARDIO: RRR, S1/S2 heard, no murmur  LUNGS: CTA B, no wheezes, no crackles  ABDOMEN:   EXTREM:  Warm, well perfused.  Moves all 4 extremities spontaneously; no lateralization. Distal pulses 1+/4.  trace pretibial edema.  GU:   SKIN:     Assessment & Plan   Problems addressed today: General Plan & Pt Instructions:  1. Low back pain   2. Flank pain       Refill pain medication  DRINK and see hand out  Refill all medications   Screening labs today, I will send you a letter with your results     For further discussion of A/P and for follow up issues see problem based charting if applicable.

## 2013-02-01 NOTE — Patient Instructions (Signed)
Refill pain medication  DRINK and see hand out  Refill all medications   Screening labs today, I will send you a letter with your results   If you need anything prior to your next visit please call the clinic. Please Bring all medications or accurate medication list with you to each appointment; an accurate medication list is essential in providing you the best care possible.     Kidney Stones Kidney stones (urolithiasis) are deposits that form inside your kidneys. The intense pain is caused by the stone moving through the urinary tract. When the stone moves, the ureter goes into spasm around the stone. The stone is usually passed in the urine.  CAUSES   A disorder that makes certain neck glands produce too much parathyroid hormone (primary hyperparathyroidism).  A buildup of uric acid crystals, similar to gout in your joints.  Narrowing (stricture) of the ureter.  A kidney obstruction present at birth (congenital obstruction).  Previous surgery on the kidney or ureters.  Numerous kidney infections. SYMPTOMS   Feeling sick to your stomach (nauseous).  Throwing up (vomiting).  Blood in the urine (hematuria).  Pain that usually spreads (radiates) to the groin.  Frequency or urgency of urination. DIAGNOSIS   Taking a history and physical exam.  Blood or urine tests.  CT scan.  Occasionally, an examination of the inside of the urinary bladder (cystoscopy) is performed. TREATMENT   Observation.  Increasing your fluid intake.  Extracorporeal shock wave lithotripsy This is a noninvasive procedure that uses shock waves to break up kidney stones.  Surgery may be needed if you have severe pain or persistent obstruction. There are various surgical procedures. Most of the procedures are performed with the use of small instruments. Only small incisions are needed to accommodate these instruments, so recovery time is minimized. The size, location, and chemical composition  are all important variables that will determine the proper choice of action for you. Talk to your health care provider to better understand your situation so that you will minimize the risk of injury to yourself and your kidney.  HOME CARE INSTRUCTIONS   Drink enough water and fluids to keep your urine clear or pale yellow. This will help you to pass the stone or stone fragments.  Strain all urine through the provided strainer. Keep all particulate matter and stones for your health care provider to see. The stone causing the pain may be as small as a grain of salt. It is very important to use the strainer each and every time you pass your urine. The collection of your stone will allow your health care provider to analyze it and verify that a stone has actually passed. The stone analysis will often identify what you can do to reduce the incidence of recurrences.  Only take over-the-counter or prescription medicines for pain, discomfort, or fever as directed by your health care provider.  Make a follow-up appointment with your health care provider as directed.  Get follow-up X-rays if required. The absence of pain does not always mean that the stone has passed. It may have only stopped moving. If the urine remains completely obstructed, it can cause loss of kidney function or even complete destruction of the kidney. It is your responsibility to make sure X-rays and follow-ups are completed. Ultrasounds of the kidney can show blockages and the status of the kidney. Ultrasounds are not associated with any radiation and can be performed easily in a matter of minutes. SEEK MEDICAL CARE IF:  You experience pain that is progressive and unresponsive to any pain medicine you have been prescribed. SEEK IMMEDIATE MEDICAL CARE IF:   Pain cannot be controlled with the prescribed medicine.  You have a fever or shaking chills.  The severity or intensity of pain increases over 18 hours and is not relieved by  pain medicine.  You develop a new onset of abdominal pain.  You feel faint or pass out.  You are unable to urinate. MAKE SURE YOU:   Understand these instructions.  Will watch your condition.  Will get help right away if you are not doing well or get worse. Document Released: 02/25/2005 Document Revised: 10/28/2012 Document Reviewed: 07/29/2012 [facility] Patient Information 2014 [place], [place].

## 2013-02-01 NOTE — Assessment & Plan Note (Signed)
Fasting labs today.  Will need to calculate ASCVD risk

## 2013-02-01 NOTE — Assessment & Plan Note (Signed)
Patient has been out of medications for quite some time. Has refills available however has not been able to pick them up.

## 2013-02-15 ENCOUNTER — Encounter: Payer: Self-pay | Admitting: Sports Medicine

## 2013-02-15 MED ORDER — VITAMIN D3 1.25 MG (50000 UT) PO CAPS: 1.0000 | ORAL_CAPSULE | ORAL | Status: DC

## 2013-02-15 NOTE — Addendum Note (Signed)
Addended by: Gaspar Bidding D on: 02/15/2013 07:45 PM   Modules accepted: Orders

## 2013-02-17 ENCOUNTER — Other Ambulatory Visit: Payer: Self-pay | Admitting: *Deleted

## 2013-02-17 MED ORDER — VITAMIN D3 1.25 MG (50000 UT) PO CAPS: 1.0000 | ORAL_CAPSULE | ORAL | Status: DC

## 2013-02-17 NOTE — Telephone Encounter (Signed)
Pt  Called because [facility] does not have a copy of the Vitamin D3 prescription and would like a print copy to pick up so she can take it there. jw

## 2013-02-26 ENCOUNTER — Other Ambulatory Visit: Payer: Self-pay | Admitting: Sports Medicine

## 2013-02-26 MED ORDER — ALBUTEROL SULFATE HFA 108 (90 BASE) MCG/ACT IN AERS: 2.0000 | INHALATION_SPRAY | RESPIRATORY_TRACT | Status: DC | PRN

## 2013-03-25 ENCOUNTER — Ambulatory Visit (INDEPENDENT_AMBULATORY_CARE_PROVIDER_SITE_OTHER): Payer: No Typology Code available for payment source | Admitting: Sports Medicine

## 2013-03-25 ENCOUNTER — Encounter: Payer: Self-pay | Admitting: Sports Medicine

## 2013-03-25 VITALS — BP 154/79 | HR 75 | Temp 97.5°F | Wt 386.0 lb

## 2013-03-25 DIAGNOSIS — F32A Depression, unspecified: Secondary | ICD-10-CM

## 2013-03-25 DIAGNOSIS — F329 Major depressive disorder, single episode, unspecified: Secondary | ICD-10-CM

## 2013-03-25 DIAGNOSIS — G43919 Migraine, unspecified, intractable, without status migrainosus: Secondary | ICD-10-CM

## 2013-03-25 DIAGNOSIS — F3289 Other specified depressive episodes: Secondary | ICD-10-CM

## 2013-03-25 DIAGNOSIS — G894 Chronic pain syndrome: Secondary | ICD-10-CM

## 2013-03-25 DIAGNOSIS — I10 Essential (primary) hypertension: Secondary | ICD-10-CM

## 2013-03-25 MED ORDER — PROMETHAZINE HCL 25 MG PO TABS: 25.0000 mg | ORAL_TABLET | Freq: Three times a day (TID) | ORAL | Status: DC | PRN

## 2013-03-25 MED ORDER — HYDROCODONE-ACETAMINOPHEN 5-325 MG PO TABS: 1.0000 | ORAL_TABLET | Freq: Four times a day (QID) | ORAL | Status: DC | PRN

## 2013-03-25 MED ORDER — QUINAPRIL-HYDROCHLOROTHIAZIDE 20-12.5 MG PO TABS: 1.0000 | ORAL_TABLET | Freq: Every day | ORAL | Status: DC

## 2013-03-25 NOTE — Progress Notes (Signed)
  Catherine SalvoBeverly A Powell - 48 y.o. female MRN [id number]  Date of birth: 06/08/1965  CC, HPI, Interval History & ROS  Catherine SpragueBeverly is here today to followup on her chronic medical conditions including:  HTN, HLD, Chronic Pain syndrome with Migraines   She reports having migraines   Pt denies chest pain, dyspnea at rest or exertion, PND, lower extremity edema.  Patient denies any facial asymmetry, unilateral weakness, or dysarthria.  Reports pain medications allow her to function on a daily basis.  Continues to have intermittent nausea especially with the headaches.  Needs refill on her Catherine Powell.  Tearful because she believes she is starting menopause.  Pertinent History & Care Coordination  Catherine Powell's major active medical problems include: # CVD: HTN, HLD, obesity no known prior coronary artery disease or CVA - Nonischemic nuclear ETT in Oct 2008 - No prior sleep study # Respiratory: Mild persistent asthma - no documented PFTs - On Pulmicort, formoterol, Ventolin. # Chronic pain, chronic migraines, MSK:  [facility].   - Only medications that have seemed to help her chronic pain medications.   - Has known bilateral knee & hip osteoarthritis   Other Pertinent Med/Surg/Hosp History: # GERD  # no documented surgeries  Follow up Issues:  Update history further F/u Resp Meds   History  Smoking status  . Never Smoker   Smokeless tobacco  . Never Used  No health maintenance topics applied.  Recent Labs  02/01/13 1548  TRIG 142  CHOL 198  HDL 45  LDLCALC 125*  TSH [phone]     Otherwise past Medical, Surgical, Social, and Family History Reviewed per EMR Medications and Allergies reviewed and all updated if necessary. Objective Findings  VITALS: HR: 75 bpm  BP: 154/79 mmHg  TEMP: 97.5 F (36.4 C) (Oral)  RESP:    HT:    WT: 386 lb (175.088 kg)  BMI:     BP Readings from Last 3 Encounters:  03/25/13 154/79  02/01/13 177/82    12/23/12 158/81   Wt Readings from Last 3 Encounters:  03/25/13 386 lb (175.088 kg)  02/01/13 373 lb (169.192 kg)  12/23/12 371 lb (168.284 kg)     PHYSICAL EXAM: GENERAL:  adult morbidly obese Caucasian female. In no discomfort; no respiratory distress  PSYCH: alert and appropriate, good insight  Somewhat tearful on exam as she reports menstrual irregularity she is associating with entering menopause.  Also reporting a vasomotor symptoms.    HNEENT:  no JVD   CARDIO: RRR, S1/S2 heard, no murmur  LUNGS: CTA B, no wheezes, no crackles  ABDOMEN:   EXTREM:  Warm, well perfused.  Moves all 4 extremities spontaneously; no lateralization.  Distal pulses 1+4.  no pretibial edema.  GU:   SKIN:      Assessment & Plan   Problems addressed today: General Plan & Pt Instructions:  1. MIGRAINE NOS W/INTRACTABLE MIGRAINE   2. ESSENTIAL HYPERTENSION   3. Chronic pain syndrome   4. Depression       Refilled meds  Try taking mobic    For further discussion of A/P and for follow up issues see problem based charting.

## 2013-03-25 NOTE — Patient Instructions (Signed)
   Refilled meds  Try taking mobic   If you need anything prior to your next visit please call the clinic. Please Bring all medications or accurate medication list with you to each appointment; an accurate medication list is essential in providing you the best care possible.

## 2013-03-30 NOTE — Assessment & Plan Note (Addendum)
Stable on her current regimen and recognizes this as not preferred treatment.  But is resistant to any further changes.

## 2013-03-30 NOTE — Assessment & Plan Note (Signed)
Declines further treatment

## 2013-03-30 NOTE — Assessment & Plan Note (Signed)
Refill chronic stable pain medication. Encouraged to try taking her Mobic for menometrorrhagia.

## 2013-03-30 NOTE — Assessment & Plan Note (Signed)
Patient declines further medication adjustments. Not interested in a sleep study.

## 2013-04-19 ENCOUNTER — Ambulatory Visit (INDEPENDENT_AMBULATORY_CARE_PROVIDER_SITE_OTHER): Payer: No Typology Code available for payment source | Admitting: Emergency Medicine

## 2013-04-19 ENCOUNTER — Telehealth: Payer: Self-pay | Admitting: *Deleted

## 2013-04-19 VITALS — BP 148/77 | HR 81 | Temp 98.8°F | Wt 389.7 lb

## 2013-04-19 DIAGNOSIS — J209 Acute bronchitis, unspecified: Secondary | ICD-10-CM

## 2013-04-19 MED ORDER — BENZONATATE 200 MG PO CAPS: 200.0000 mg | ORAL_CAPSULE | Freq: Two times a day (BID) | ORAL | Status: DC | PRN

## 2013-04-19 MED ORDER — AZITHROMYCIN 250 MG PO TABS: ORAL_TABLET | ORAL | Status: DC

## 2013-04-19 NOTE — Telephone Encounter (Signed)
Spoke with Catherine Powell and ok'ed the refill on Friday due to pharmacy being closed due to weekend.

## 2013-04-19 NOTE — Progress Notes (Signed)
   Subjective:    Patient ID: Catherine Powell, female    DOB: 09/04/1965, 48 y.o.   MRN: [id number]  HPI Catherine Powell is here for a SDA for cough.  She reports that the cough started 1 week ago.  Initially had fevers to 101, but none in the last few days.  Denies any associated nasal symptoms.  Does report sinus headache and feeling run down.  Cough is non-productive.  Eating and drinking well, no vomiting.   Current Outpatient Prescriptions on File Prior to Visit  Medication Sig Dispense Refill  . albuterol (VENTOLIN HFA) 108 (90 BASE) MCG/ACT inhaler Inhale 2 puffs into the lungs every 4 (four) hours as needed for wheezing.  3.7 g  3  . budesonide (PULMICORT) 180 MCG/ACT inhaler Inhale 1 puff into the lungs 2 (two) times daily.  1 Inhaler  11  . esomeprazole (NEXIUM) 40 MG capsule Take 1 capsule (40 mg total) by mouth daily before breakfast.  30 capsule  11  . formoterol (FORADIL) 12 MCG capsule for inhaler Place 1 capsule (12 mcg total) into inhaler and inhale 2 (two) times daily.  60 capsule  12  . HYDROcodone-acetaminophen (NORCO) 5-325 MG per tablet Take 1 tablet by mouth every 6 (six) hours as needed for moderate pain.  90 tablet  0  . HYDROcodone-acetaminophen (NORCO) 5-325 MG per tablet Take 1 tablet by mouth every 6 (six) hours as needed for moderate pain.  90 tablet  0  . HYDROcodone-acetaminophen (NORCO/VICODIN) 5-325 MG per tablet Take 1 tablet by mouth every 6 (six) hours as needed.  90 tablet  0  . meloxicam (MOBIC) 15 MG tablet Take 1 tablet (15 mg total) by mouth daily.  30 tablet  5  . olopatadine (PATANOL) 0.1 % ophthalmic solution Place 1 drop into both eyes 2 (two) times daily.  5 mL  6  . promethazine (PHENERGAN) 25 MG tablet Take 1 tablet (25 mg total) by mouth every 8 (eight) hours as needed for nausea.  40 tablet  0  . quinapril-hydrochlorothiazide (ACCURETIC) 20-12.5 MG per tablet Take 1 tablet by mouth daily.  30 tablet  11  . Spacer/Aero-Holding  Chambers (AEROCHAMBER PLUS) inhaler 1 each by Other route once. Use as instructed.use with albuterol and pulmicort  Dx: Asthma 493.0        No current facility-administered medications on file prior to visit.    I have reviewed and updated the following as appropriate: allergies and current medications SHx: non smoker   Review of Systems See HPI    Objective:   Physical Exam BP 148/77  Pulse 81  Temp(Src) 98.8 F (37.1 C) (Oral)  Wt 389 lb 11.2 oz (176.767 kg) Gen: alert, cooperative, NAD HEENT: AT/[place], sclera white, MMM, no pharyngeal erythema or edema Neck: supple, no LAD CV: RRR, no murmurs Pulm: CTAB, no wheezes or rales; significant cough present     Assessment & Plan:

## 2013-04-19 NOTE — Patient Instructions (Signed)
It was nice to see you!  I think you have bronchitis. Take the z-pac as prescribed. Use tessalon pearls and honey as needed for the cough. Your cough will linger for several weeks.  F/u in 1 week if not improving.

## 2013-04-19 NOTE — Assessment & Plan Note (Signed)
With time course and fevers will treat with z-pac. Also tessalon pearls prn for cough. Discussed time course of cough. F/u in 1 week if not improving.

## 2013-04-19 NOTE — Telephone Encounter (Signed)
Agree with "early" refill given circumstances

## 2013-04-19 NOTE — Telephone Encounter (Signed)
Received call from WisnerElizabeth at [facility] regarding Rx Norco 5-325.  Could this Rx be filled one day early on 04/23/2013, due to pharmacy closed on weekends.  Rx written as do not fill until 30 days after written.  Rx given to pt on 03/25/2013.  Please contact Elizabeth at [phone].  Clovis PuMartin, Elizar Alpern L, RN

## 2013-04-21 ENCOUNTER — Other Ambulatory Visit: Payer: Self-pay | Admitting: *Deleted

## 2013-04-22 MED ORDER — ALBUTEROL SULFATE HFA 108 (90 BASE) MCG/ACT IN AERS: 2.0000 | INHALATION_SPRAY | RESPIRATORY_TRACT | Status: DC | PRN

## 2013-04-22 NOTE — Addendum Note (Signed)
Addended by: Gaspar BiddingIGBY, Tiasia Weberg D on: 04/22/2013 05:09 PM   Modules accepted: Orders

## 2013-05-12 ENCOUNTER — Telehealth: Payer: Self-pay | Admitting: Sports Medicine

## 2013-05-12 ENCOUNTER — Ambulatory Visit (INDEPENDENT_AMBULATORY_CARE_PROVIDER_SITE_OTHER): Payer: No Typology Code available for payment source | Admitting: Family Medicine

## 2013-05-12 ENCOUNTER — Encounter: Payer: Self-pay | Admitting: Family Medicine

## 2013-05-12 VITALS — BP 162/92 | HR 85 | Temp 97.8°F | Ht 68.5 in | Wt 391.8 lb

## 2013-05-12 DIAGNOSIS — J209 Acute bronchitis, unspecified: Secondary | ICD-10-CM

## 2013-05-12 DIAGNOSIS — J45909 Unspecified asthma, uncomplicated: Secondary | ICD-10-CM

## 2013-05-12 MED ORDER — AMOXICILLIN-POT CLAVULANATE 875-125 MG PO TABS: 1.0000 | ORAL_TABLET | Freq: Two times a day (BID) | ORAL | Status: DC

## 2013-05-12 MED ORDER — BUDESONIDE 180 MCG/ACT IN AEPB: 1.0000 | INHALATION_SPRAY | Freq: Two times a day (BID) | RESPIRATORY_TRACT | Status: DC

## 2013-05-12 MED ORDER — BENZONATATE 200 MG PO CAPS: 200.0000 mg | ORAL_CAPSULE | Freq: Two times a day (BID) | ORAL | Status: DC | PRN

## 2013-05-12 NOTE — Telephone Encounter (Signed)
[facility]

## 2013-05-13 NOTE — Progress Notes (Signed)
   Subjective:    Patient ID: Catherine Powell, female    DOB: 12/18/1965, 48 y.o.   MRN: [id number]  HPI 3 weeks of sinus congestion and productive cough. Intermittently she's had some fevers. She took a Z-Pak but did not seem to improve at all. I before last she felt like she had some sweats and chills but did not take her temperature. She has asthma and is currently on one of her inhaler so she feels like her wheezing is worse.   Review of Systems See history of present illness    Objective:   Physical Exam  Vital signs are reviewed on GENERAL: Well-developed female no acute distress    EENT: TMs bilaterally are somewhat dull but have fairly normal landmarks. Oropharynx is erythematous but no exudate. She has fairly large tonsils. Nasal mucosa is quite swollen with some green exudate. Maxillary sinus tenderness to palpation of the left. Lungs bilaterally have some expiratory wheeze at the left base otherwise good air movement.      Assessment & Plan:  #1. Likely sinusitis. We'll treat her with antibiotic again and refill her inhalers. She's not improving she'll let me know.

## 2013-05-14 ENCOUNTER — Other Ambulatory Visit: Payer: Self-pay | Admitting: *Deleted

## 2013-05-14 DIAGNOSIS — J209 Acute bronchitis, unspecified: Secondary | ICD-10-CM

## 2013-05-14 MED ORDER — AMOXICILLIN-POT CLAVULANATE 875-125 MG PO TABS: 1.0000 | ORAL_TABLET | Freq: Two times a day (BID) | ORAL | Status: DC

## 2013-05-14 MED ORDER — BENZONATATE 200 MG PO CAPS: 200.0000 mg | ORAL_CAPSULE | Freq: Two times a day (BID) | ORAL | Status: DC | PRN

## 2013-05-14 MED ORDER — BUDESONIDE 180 MCG/ACT IN AEPB: 1.0000 | INHALATION_SPRAY | Freq: Two times a day (BID) | RESPIRATORY_TRACT | Status: DC

## 2013-06-14 ENCOUNTER — Telehealth: Payer: Self-pay | Admitting: *Deleted

## 2013-06-14 MED ORDER — ALBUTEROL SULFATE HFA 108 (90 BASE) MCG/ACT IN AERS: 2.0000 | INHALATION_SPRAY | RESPIRATORY_TRACT | Status: DC | PRN

## 2013-06-14 NOTE — Telephone Encounter (Signed)
Refill for ventolin.  Printed.  [facility]

## 2013-06-28 ENCOUNTER — Encounter: Payer: Self-pay | Admitting: Sports Medicine

## 2013-06-28 ENCOUNTER — Ambulatory Visit (INDEPENDENT_AMBULATORY_CARE_PROVIDER_SITE_OTHER): Payer: No Typology Code available for payment source | Admitting: Sports Medicine

## 2013-06-28 VITALS — BP 149/63 | HR 77 | Temp 98.1°F | Ht 68.5 in | Wt 288.0 lb

## 2013-06-28 DIAGNOSIS — L304 Erythema intertrigo: Secondary | ICD-10-CM

## 2013-06-28 DIAGNOSIS — G894 Chronic pain syndrome: Secondary | ICD-10-CM

## 2013-06-28 DIAGNOSIS — G43919 Migraine, unspecified, intractable, without status migrainosus: Secondary | ICD-10-CM

## 2013-06-28 DIAGNOSIS — L538 Other specified erythematous conditions: Secondary | ICD-10-CM

## 2013-06-28 DIAGNOSIS — J45909 Unspecified asthma, uncomplicated: Secondary | ICD-10-CM

## 2013-06-28 MED ORDER — HYDROCODONE-ACETAMINOPHEN 5-325 MG PO TABS: 1.0000 | ORAL_TABLET | Freq: Four times a day (QID) | ORAL | Status: DC | PRN

## 2013-06-28 MED ORDER — TIOTROPIUM BROMIDE MONOHYDRATE 18 MCG IN CAPS: 18.0000 ug | ORAL_CAPSULE | Freq: Every day | RESPIRATORY_TRACT | Status: DC

## 2013-06-28 MED ORDER — BUDESONIDE 180 MCG/ACT IN AEPB: 1.0000 | INHALATION_SPRAY | Freq: Two times a day (BID) | RESPIRATORY_TRACT | Status: DC

## 2013-06-28 NOTE — Progress Notes (Signed)
Catherine Powell - 48 y.o. female MRN [id number]  Date of birth: 09/05/1965  CC & SUBJECTIVE:      Chief Complaint  Patient presents with  . Medical Management of Chronic Issues    Including asthma, allergies, hypertension, hyperlipidemia  . Pain    Chronic neck, back, migraines  . Rash    Patient reports recurrence of chronic intertrigo and is requesting refill   See problem based charting for additional problem specific subjective (including HPI, Interval History & ROS)   HISTORY: Catherine Powell's major active medical problems include: # CVD: HTN, HLD, obesity no known prior coronary artery disease or CVA - Nonischemic nuclear ETT in Oct 2008 - No prior sleep study # Respiratory: Mild persistent asthma - no documented PFTs - On Pulmicort, formoterol, Ventolin. # Chronic pain, chronic migraines, MSK:  [facility].   - Only medications that have seemed to help her chronic pain medications.   - Has known bilateral knee & hip osteoarthritis   Other Pertinent Med/Surg/Hosp History: # GERD  # no documented surgeries  Follow up Issues:  F/u Resp Meds    Recent Labs  02/01/13 1548  TRIG 142  CHOL 198  HDL 45  LDLCALC 125*  TSH 4.0981.329  } Wt Readings from Last 3 Encounters:  06/28/13 288 lb (130.636 kg)  05/12/13 391 lb 12.8 oz (177.719 kg)  04/19/13 389 lb 11.2 oz (176.767 kg)   BP Readings from Last 3 Encounters:  06/28/13 149/63  05/12/13 162/92  04/19/13 148/77    History  Smoking status  . Never Smoker   Smokeless tobacco  . Never Used   Health Maintenance Due  Topic  . Pap Smear     Otherwise past Medical, Surgical, Social, and Family History Reviewed per EMR Medications and Allergies reviewed and updated per below.  VITALS: BP 149/63  Pulse 77  Temp(Src) 98.1 F (36.7 C) (Oral)  Ht 5' 8.5" (1.74 m)  Wt 288 lb (130.636 kg)  BMI 43.15 kg/m2  PHYSICAL EXAM: GENERAL: Adult obese caucasian  female. In no discomfort;  no respiratory distress  PSYCH: alert and appropriate, good insight   HNEENT: mmm, no jvd  CARDIO: RRR, S1/S2 heard, no murmur  LUNGS: CTA B, no wheezes, no crackles  ABDOMEN:   EXTREM:  Warm, well perfused.  Moves all 4 extremities spontaneously; no lateralization.  Pedal pulses 2+/4.  trace pretibial edema.    MEDICATIONS, LABS & OTHER ORDERS: Previous Medications   ALBUTEROL (VENTOLIN HFA) 108 (90 BASE) MCG/ACT INHALER    Inhale 2 puffs into the lungs every 4 (four) hours as needed for wheezing.   ESOMEPRAZOLE (NEXIUM) 40 MG CAPSULE    Take 1 capsule (40 mg total) by mouth daily before breakfast.   MELOXICAM (MOBIC) 15 MG TABLET    Take 1 tablet (15 mg total) by mouth daily.   OLOPATADINE (PATANOL) 0.1 % OPHTHALMIC SOLUTION    Place 1 drop into both eyes 2 (two) times daily.   PROMETHAZINE (PHENERGAN) 25 MG TABLET    Take 1 tablet (25 mg total) by mouth every 8 (eight) hours as needed for nausea.   QUINAPRIL-HYDROCHLOROTHIAZIDE (ACCURETIC) 20-12.5 MG PER TABLET    Take 1 tablet by mouth daily.   SPACER/AERO-HOLDING CHAMBERS (AEROCHAMBER PLUS) INHALER    1 each by Other route once. Use as instructed.use with albuterol and pulmicort  Dx: Asthma 493.0    Modified Medications   Modified Medication Previous Medication   BUDESONIDE (PULMICORT)  180 MCG/ACT INHALER budesonide (PULMICORT) 180 MCG/ACT inhaler      Inhale 1 puff into the lungs 2 (two) times daily.    Inhale 1 puff into the lungs 2 (two) times daily.   HYDROCODONE-ACETAMINOPHEN (NORCO) 5-325 MG PER TABLET HYDROcodone-acetaminophen (NORCO) 5-325 MG per tablet      Take 1 tablet by mouth every 6 (six) hours as needed for moderate pain.    Take 1 tablet by mouth every 6 (six) hours as needed for moderate pain.   HYDROCODONE-ACETAMINOPHEN (NORCO) 5-325 MG PER TABLET HYDROcodone-acetaminophen (NORCO) 5-325 MG per tablet      Take 1 tablet by mouth every 6 (six) hours as needed for moderate pain.    Take 1 tablet by mouth every 6 (six)  hours as needed for moderate pain.   HYDROCODONE-ACETAMINOPHEN (NORCO/VICODIN) 5-325 MG PER TABLET HYDROcodone-acetaminophen (NORCO/VICODIN) 5-325 MG per tablet      Take 1 tablet by mouth every 6 (six) hours as needed.    Take 1 tablet by mouth every 6 (six) hours as needed.   New Prescriptions   NYSTATIN CREAM (MYCOSTATIN)    Apply 1 application topically 2 (two) times daily.   Discontinued Medications   AMOXICILLIN-CLAVULANATE (AUGMENTIN) 875-125 MG PER TABLET    Take 1 tablet by mouth 2 (two) times daily.   BENZONATATE (TESSALON) 200 MG CAPSULE    Take 1 capsule (200 mg total) by mouth 2 (two) times daily as needed for cough.   FORMOTEROL (FORADIL) 12 MCG CAPSULE FOR INHALER    Place 1 capsule (12 mcg total) into inhaler and inhale 2 (two) times daily.  No orders of the defined types were placed in this encounter.   ASSESSMENT & PLAN: See problem based charting & AVS for pt instructions.

## 2013-06-28 NOTE — Patient Instructions (Signed)
See if you are able to pick up Spiriva.  If you are I would like for you to wean off of your Foradil.  If spiriva isn't available please start Pulmicort as being on Foradil without using Pulmicort can increase your chance of complications.  Please schedule a lab appointment for first thing one morning in the next 1-2 weeks.  Please do not eat anything for 8 hours before your visit (dont eat after midnight).   I will call you with any abnormal labs.  Otherwise, I will send you a letter with your normal results and recommendations for repeat testing

## 2013-06-29 ENCOUNTER — Encounter: Payer: Self-pay | Admitting: Sports Medicine

## 2013-06-29 DIAGNOSIS — L304 Erythema intertrigo: Secondary | ICD-10-CM

## 2013-06-30 DIAGNOSIS — L304 Erythema intertrigo: Secondary | ICD-10-CM | POA: Insufficient documentation

## 2013-06-30 MED ORDER — NYSTATIN 100000 UNIT/GM EX CREA: 1.0000 "application " | TOPICAL_CREAM | Freq: Two times a day (BID) | CUTANEOUS | Status: DC

## 2013-06-30 NOTE — Assessment & Plan Note (Signed)
Exam deferred.  Patient reports this is an ongoing recurrent issue. Refill provided

## 2013-06-30 NOTE — Assessment & Plan Note (Signed)
Problem Based Documentation:    Subjective Report:  Reports significant improvement on formoterol but has been out of Pulmicort  Overall improved chronic cough  Respiratory symptoms not function limiting     Assessment & Plan & Follow up Issues:  Chronic, poorly controlled condition - Discussed importance of using a controller medication other than long acting beta agonist in the setting of asthma due to increased risk of death.  Patient agreeable to changing to either Spiriva or filling Pulmicort if Spiriva not available 1. Plan to discontinue formoterol  Addendum: [facility] program called to inform patient has only had a 30 day supply of formoterol filled in the last 4 months.  After discussing options they will provide Pulmicort for the time being and defer Spiriva. > Follow up which medications she is currently using > Consider formal PFTs, some of this may be associated with obesity hypoventilation syndrome which is not amenable to a sleep study  .

## 2013-07-01 MED ORDER — NYSTATIN 100000 UNIT/GM EX CREA: 1.0000 "application " | TOPICAL_CREAM | Freq: Two times a day (BID) | CUTANEOUS | Status: DC

## 2013-07-01 NOTE — Assessment & Plan Note (Signed)
Problem Based Documentation:    Subjective Report: Denies any adverse effects to pain medications including nausea, vomiting, confusion, sleepiness, fatigue, constipation. Current medication regimen is significantly improving functional status. Has been out of meds X 4-5 days     Assessment & Plan & Follow up Issues:  Chronic condition, stable on current regimen with no discernable effects 1. Refills provided > Needs quantative UDS and controlled substance data base check + new controlled substance contract when sees new provider

## 2013-07-08 ENCOUNTER — Other Ambulatory Visit: Payer: Self-pay | Admitting: Sports Medicine

## 2013-07-08 MED ORDER — BUDESONIDE-FORMOTEROL FUMARATE 160-4.5 MCG/ACT IN AERO: 1.0000 | INHALATION_SPRAY | Freq: Two times a day (BID) | RESPIRATORY_TRACT | Status: DC

## 2013-07-14 MED ORDER — NYSTATIN 100000 UNIT/GM EX CREA: 1.0000 "application " | TOPICAL_CREAM | Freq: Two times a day (BID) | CUTANEOUS | Status: DC

## 2013-09-17 ENCOUNTER — Ambulatory Visit (INDEPENDENT_AMBULATORY_CARE_PROVIDER_SITE_OTHER): Payer: No Typology Code available for payment source | Admitting: Family Medicine

## 2013-09-17 ENCOUNTER — Encounter: Payer: Self-pay | Admitting: Family Medicine

## 2013-09-17 DIAGNOSIS — G43919 Migraine, unspecified, intractable, without status migrainosus: Secondary | ICD-10-CM

## 2013-09-17 DIAGNOSIS — N390 Urinary tract infection, site not specified: Secondary | ICD-10-CM

## 2013-09-17 DIAGNOSIS — R300 Dysuria: Secondary | ICD-10-CM

## 2013-09-17 LAB — POCT URINALYSIS DIPSTICK
BILIRUBIN UA: NEGATIVE
Glucose, UA: NEGATIVE
Ketones, UA: NEGATIVE
Nitrite, UA: NEGATIVE
Protein, UA: NEGATIVE
RBC UA: NEGATIVE
Spec Grav, UA: 1.01
Urobilinogen, UA: 0.2
pH, UA: 7

## 2013-09-17 LAB — POCT UA - MICROSCOPIC ONLY: WBC, Ur, HPF, POC: >20

## 2013-09-17 MED ORDER — HYDROCODONE-ACETAMINOPHEN 5-325 MG PO TABS: 1.0000 | ORAL_TABLET | Freq: Four times a day (QID) | ORAL | Status: DC | PRN

## 2013-09-17 MED ORDER — CEPHALEXIN 500 MG PO CAPS: 500.0000 mg | ORAL_CAPSULE | Freq: Four times a day (QID) | ORAL | Status: DC

## 2013-09-17 NOTE — Progress Notes (Signed)
Patient ID: Catherine Powell, female   DOB: 08/07/1965, 48 y.o.   MRN: [id number]   Subjective:  HPI:   Catherine Powell is a 48 y.o. female with a history of migraine, asthma, HTN, HLD here for urinary urgency worried about UTI.  She endorses a 3 week history of progressively worsening urinary urgency and increased nocturia associated with shooting R-sided back pains intermittently worsened with laying supine. It is difficult for her to urinate at times. She denies abd pain, nausea, vomiting, diarrhea, fever, dysuria, hematuria. She has tried cutting out caffeine, drinking cranberry juice, and increasing water intake without significant improvement in symptoms. +History of UTIs.   Review of Systems:  Per HPI. All other systems reviewed and are negative.    Past Medical History: Patient Active Problem List   Diagnosis Date Noted  . Intertrigo 06/30/2013  . Preventive measure 02/01/2013  . Flank pain 02/01/2013  . Left hip pain 12/14/2012  . Chronic pain syndrome 10/23/2012  . Degenerative joint disease of knees 05/01/2010  . CERVICAL RADICULOPATHY, LEFT 08/06/2007  . DYSHIDROTIC ECZEMA 07/31/2007  . Depression 12/16/2006  . MIGRAINE NOS W/INTRACTABLE MIGRAINE 11/19/2006  . ESSENTIAL HYPERTENSION 05/22/2006  . HYPERCHOLESTEROLEMIA 05/08/2006  . OBESITY, NOS 05/08/2006  . RHINITIS, ALLERGIC 05/08/2006  . ASTHMA, PERSISTENT, MILD 05/08/2006  . GASTROESOPHAGEAL REFLUX, NO ESOPHAGITIS 05/08/2006    Medications: reviewed and updated Current Outpatient Prescriptions  Medication Sig Dispense Refill  . albuterol (VENTOLIN HFA) 108 (90 BASE) MCG/ACT inhaler Inhale 2 puffs into the lungs every 4 (four) hours as needed for wheezing.  3.7 g  3  . budesonide-formoterol (SYMBICORT) 160-4.5 MCG/ACT inhaler Inhale 1 puff into the lungs 2 (two) times daily.  1 Inhaler  11  . cephALEXin (KEFLEX) 500 MG capsule Take 1 capsule (500 mg total) by mouth 4 (four) times daily.  28 capsule   0  . esomeprazole (NEXIUM) 40 MG capsule Take 1 capsule (40 mg total) by mouth daily before breakfast.  30 capsule  11  . HYDROcodone-acetaminophen (NORCO) 5-325 MG per tablet Take 1 tablet by mouth every 6 (six) hours as needed for moderate pain.  90 tablet  0  . HYDROcodone-acetaminophen (NORCO) 5-325 MG per tablet Take 1 tablet by mouth every 6 (six) hours as needed for moderate pain.  90 tablet  0  . HYDROcodone-acetaminophen (NORCO/VICODIN) 5-325 MG per tablet Take 1 tablet by mouth every 6 (six) hours as needed.  90 tablet  0  . meloxicam (MOBIC) 15 MG tablet Take 1 tablet (15 mg total) by mouth daily.  30 tablet  5  . nystatin cream (MYCOSTATIN) Apply 1 application topically 2 (two) times daily.  30 g  2  . olopatadine (PATANOL) 0.1 % ophthalmic solution Place 1 drop into both eyes 2 (two) times daily.  5 mL  6  . promethazine (PHENERGAN) 25 MG tablet Take 1 tablet (25 mg total) by mouth every 8 (eight) hours as needed for nausea.  40 tablet  0  . quinapril-hydrochlorothiazide (ACCURETIC) 20-12.5 MG per tablet Take 1 tablet by mouth daily.  30 tablet  11  . Spacer/Aero-Holding Chambers (AEROCHAMBER PLUS) inhaler 1 each by Other route once. Use as instructed.use with albuterol and pulmicort  Dx: Asthma 493.0        No current facility-administered medications for this visit.    Objective:  Physical Exam: There were no vitals taken for this visit.  Gen: Morbidly obese 48 y.o. female in NAD Abd: Soft, very large, NTND, BS present,  no guarding. No suprapubic tenderness. Some pain to R CVA percussion.       Chemistry      Component Value Date/Time   NA 137 02/01/2013 1548   K 3.9 02/01/2013 1548   CL 100 02/01/2013 1548   CO2 28 02/01/2013 1548   BUN 7 02/01/2013 1548   CREATININE 0.65 02/01/2013 1548   CREATININE 0.65 01/26/2010 2051      Component Value Date/Time   CALCIUM 9.0 02/01/2013 1548   ALKPHOS 65 02/01/2013 1548   AST 16 02/01/2013 1548   ALT 21 02/01/2013 1548    BILITOT 0.3 02/01/2013 1548      Lab Results  Component Value Date   WBC 8.6 02/01/2013   HGB 12.6 02/01/2013   HCT 37.8 02/01/2013   MCV 82.2 02/01/2013   PLT 325 02/01/2013   Lab Results  Component Value Date   TSH 1.329 02/01/2013   No results found for this basename: HGBA1C   Assessment:     Catherine Powell is a 48 y.o. female here for UTI symptoms    Plan:     See problem list for problem-specific plans.

## 2013-09-17 NOTE — Addendum Note (Signed)
Addended by: Jennette BillBUSICK, Diavian Furgason L on: 09/17/2013 12:09 PM   Modules accepted: Orders

## 2013-09-17 NOTE — Addendum Note (Signed)
Addended by: Hazeline JunkerGRUNZ, Alvaretta Eisenberger B on: 09/17/2013 01:38 PM   Modules accepted: Orders

## 2013-09-17 NOTE — Assessment & Plan Note (Signed)
Symptoms of urinary urgency, nocturia, and R CVA tenderness. H/o UTIs. No history of stones. Tx with keflex, await urine studies. RTC if develops hematuria or colicky pain.

## 2013-09-17 NOTE — Patient Instructions (Signed)
Thank you for coming in today! It was great to meet you!  - We will run your urine sample for testing, but I think you have a UTI.  - You should take keflex 4 times per day for 7 days. I will call you if we need to change therapy.  - If your symptoms get worse after this, return to the office.   As you leave, make an appointment to follow up with me in 1 month to discuss migraines. I filled 1 month of your migraine medication.   Take care and seek immediate care sooner if you develop any concerns.  Please feel free to call with any questions or concerns at any time, at [phone]. - Dr. Jarvis NewcomerGrunz

## 2013-09-17 NOTE — Addendum Note (Signed)
Addended by: Hazeline JunkerGRUNZ, Mikko Lewellen B on: 09/17/2013 10:36 AM   Modules accepted: Orders

## 2013-09-23 ENCOUNTER — Encounter: Payer: Self-pay | Admitting: Family Medicine

## 2013-10-05 ENCOUNTER — Other Ambulatory Visit: Payer: Self-pay | Admitting: *Deleted

## 2013-10-05 MED ORDER — ALBUTEROL SULFATE HFA 108 (90 BASE) MCG/ACT IN AERS: 2.0000 | INHALATION_SPRAY | RESPIRATORY_TRACT | Status: DC | PRN

## 2013-10-14 ENCOUNTER — Other Ambulatory Visit: Payer: Self-pay | Admitting: *Deleted

## 2013-10-14 MED ORDER — ALBUTEROL SULFATE HFA 108 (90 BASE) MCG/ACT IN AERS: 2.0000 | INHALATION_SPRAY | RESPIRATORY_TRACT | Status: DC | PRN

## 2013-10-19 ENCOUNTER — Ambulatory Visit (INDEPENDENT_AMBULATORY_CARE_PROVIDER_SITE_OTHER): Payer: Self-pay | Admitting: Family Medicine

## 2013-10-19 ENCOUNTER — Encounter: Payer: Self-pay | Admitting: Family Medicine

## 2013-10-19 ENCOUNTER — Other Ambulatory Visit: Payer: Self-pay | Admitting: *Deleted

## 2013-10-19 VITALS — BP 166/87 | HR 66 | Temp 98.4°F | Wt 385.0 lb

## 2013-10-19 DIAGNOSIS — G43919 Migraine, unspecified, intractable, without status migrainosus: Secondary | ICD-10-CM

## 2013-10-19 DIAGNOSIS — E7800 Pure hypercholesterolemia, unspecified: Secondary | ICD-10-CM

## 2013-10-19 DIAGNOSIS — E669 Obesity, unspecified: Secondary | ICD-10-CM

## 2013-10-19 DIAGNOSIS — I10 Essential (primary) hypertension: Secondary | ICD-10-CM

## 2013-10-19 LAB — POCT GLYCOSYLATED HEMOGLOBIN (HGB A1C): HEMOGLOBIN A1C: 5.2

## 2013-10-19 MED ORDER — HYDROCODONE-ACETAMINOPHEN 5-325 MG PO TABS: 1.0000 | ORAL_TABLET | Freq: Four times a day (QID) | ORAL | Status: DC | PRN

## 2013-10-19 NOTE — Progress Notes (Signed)
Patient ID: Catherine Powell, female   DOB: 05/15/1965, 48 y.o.   MRN: [id number]   Subjective:   Catherine Powell is a 48 y.o. female with a history of refractory severe migraines here for follow up.   She began having debilitating migraines since the age of 48. She has been seen by many migraine and headache clinics in [place], [place], and neurologists here in [place], but has not seen them since 2009 when she changed jobs and had to pay $300 per visit. She has been tried on relpex, propranolol (worsened her nausea), amitriptyline, imitrex, sumatriptan, tolectin without relief; DHE helped somewhat, but she has found that excedrin helps somewhat along with medications and retiring to a dark room early in the course. Hydrocodone is the only thing that's worked.  The pain is like a vice squeezing both sides of the top of her head, severe, squiggly lines precede other symptoms. She has them 4 out of 7 days, often confining her to bed. 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   Review of Systems:  Per HPI. All other systems reviewed and are negative.  Medications: reviewed and updated Objective:  BP 166/87  Pulse 66  Temp(Src) 98.4 F (36.9 C) (Oral)  Wt 385 lb (174.635 kg) Wt Readings from Last 3 Encounters:  10/19/13 385 lb (174.635 kg)  06/28/13 288 lb (130.636 kg)  05/12/13 391 lb 12.8 oz (177.719 kg)    Gen: Morbidly obese, pleasant 48 y.o. female in NAD HEENT: MMM, EOMI, PERRL, anicteric sclerae CV: RRR, no MRG, no JVD Resp: Non-labored, CTAB, no wheezes noted Abd: Soft, NTND, BS present, no guarding or organomegaly MSK: No edema noted, full ROM Neuro: Alert and oriented, speech normal, gait normal, CN II-XII intact Assessment:     Catherine Powell is a 48 y.o. female here for migraines    Plan:     See problem list for problem-specific plans. - Colonoscopy will be due 01/15/2014

## 2013-10-19 NOTE — Patient Instructions (Signed)
-   I've refilled the vicodin for 3 months.  - I'll let you know about the results of these blood tests today. - Keep up the walking. You didn't gain it all in 1 day, you won't lose it all in 1 day. We're looking for progress, not perfection. :) - I'll see you in 3 months.   Hazeline Junker- Chesni Vos, MD

## 2013-10-19 NOTE — Assessment & Plan Note (Signed)
Stable, with aura. Refill hydrocodone. Functional status stable. Pain contract signed today.

## 2013-10-20 LAB — BASIC METABOLIC PANEL
BUN: 8 mg/dL (ref 6–23)
CALCIUM: 8.9 mg/dL (ref 8.4–10.5)
CO2: 27 mEq/L (ref 19–32)
Chloride: 102 mEq/L (ref 96–112)
Creat: 0.69 mg/dL (ref 0.50–1.10)
Glucose, Bld: 90 mg/dL (ref 70–99)
Potassium: 3.7 mEq/L (ref 3.5–5.3)
SODIUM: 138 meq/L (ref 135–145)

## 2013-10-20 LAB — LDL CHOLESTEROL, DIRECT: Direct LDL: 161 mg/dL — ABNORMAL HIGH

## 2013-10-20 MED ORDER — ESOMEPRAZOLE MAGNESIUM 40 MG PO CPDR: 40.0000 mg | DELAYED_RELEASE_CAPSULE | Freq: Every day | ORAL | Status: DC

## 2013-10-22 ENCOUNTER — Encounter: Payer: Self-pay | Admitting: Family Medicine

## 2013-10-22 ENCOUNTER — Telehealth: Payer: Self-pay | Admitting: *Deleted

## 2013-10-22 NOTE — Telephone Encounter (Signed)
wanting to know if you had received the results of my lab panel back as of yet? Sincere thanks, Catherine FritzBeverly H. Powell

## 2013-10-25 ENCOUNTER — Encounter: Payer: Self-pay | Admitting: Family Medicine

## 2013-10-25 NOTE — Telephone Encounter (Signed)
Relayed message,patient husband states she already knew,she looked at my chart.[name], Virgel BouquetGiovanna Powell

## 2013-10-25 NOTE — Telephone Encounter (Signed)
Yes, with current lipid panel, ASCVD 10-yr risk is 3.3% so no new medications are necessary at this time. Everything else is normal.

## 2013-12-22 ENCOUNTER — Other Ambulatory Visit: Payer: Self-pay | Admitting: *Deleted

## 2013-12-22 MED ORDER — ALBUTEROL SULFATE HFA 108 (90 BASE) MCG/ACT IN AERS: 2.0000 | INHALATION_SPRAY | RESPIRATORY_TRACT | Status: DC | PRN

## 2013-12-24 ENCOUNTER — Other Ambulatory Visit: Payer: Self-pay

## 2013-12-27 ENCOUNTER — Other Ambulatory Visit: Payer: Self-pay | Admitting: *Deleted

## 2013-12-27 MED ORDER — MELOXICAM 15 MG PO TABS: 15.0000 mg | ORAL_TABLET | Freq: Every day | ORAL | Status: DC

## 2014-01-27 ENCOUNTER — Other Ambulatory Visit: Payer: Self-pay | Admitting: *Deleted

## 2014-01-29 MED ORDER — BUDESONIDE-FORMOTEROL FUMARATE 160-4.5 MCG/ACT IN AERO: 1.0000 | INHALATION_SPRAY | Freq: Two times a day (BID) | RESPIRATORY_TRACT | Status: DC

## 2014-02-09 ENCOUNTER — Encounter: Payer: Self-pay | Admitting: Family Medicine

## 2014-02-10 NOTE — Telephone Encounter (Signed)
I had an left eye problem with stabbing pains sporadically, and went to my ophthalmologist and had a full eye exam and Dr Ander PurpuraJohn Jones at [facility] [phone] --He prescribed Muro 128 ointment at bedtime for my left eye on November 11,2015. I have been using this and so the eye started getting more sore. I spoke with the pharmacist and he recommended to start using artificial tears to help with the dryness and thought that would help. After three weeks, my left eye is still sore to the touch and still getting the occasional stabbing pains. I was wondering if you had any suggestions?

## 2014-02-14 ENCOUNTER — Encounter: Payer: Self-pay | Admitting: Family Medicine

## 2014-02-14 ENCOUNTER — Ambulatory Visit (INDEPENDENT_AMBULATORY_CARE_PROVIDER_SITE_OTHER): Payer: Self-pay | Admitting: Family Medicine

## 2014-02-14 VITALS — BP 177/89 | HR 80 | Temp 97.9°F | Ht 69.0 in | Wt 386.8 lb

## 2014-02-14 DIAGNOSIS — R110 Nausea: Secondary | ICD-10-CM

## 2014-02-14 DIAGNOSIS — M170 Bilateral primary osteoarthritis of knee: Secondary | ICD-10-CM

## 2014-02-14 DIAGNOSIS — I10 Essential (primary) hypertension: Secondary | ICD-10-CM

## 2014-02-14 DIAGNOSIS — Z1211 Encounter for screening for malignant neoplasm of colon: Secondary | ICD-10-CM

## 2014-02-14 DIAGNOSIS — E7800 Pure hypercholesterolemia, unspecified: Secondary | ICD-10-CM

## 2014-02-14 DIAGNOSIS — G894 Chronic pain syndrome: Secondary | ICD-10-CM

## 2014-02-14 DIAGNOSIS — G43109 Migraine with aura, not intractable, without status migrainosus: Secondary | ICD-10-CM

## 2014-02-14 DIAGNOSIS — J4530 Mild persistent asthma, uncomplicated: Secondary | ICD-10-CM

## 2014-02-14 MED ORDER — QUINAPRIL-HYDROCHLOROTHIAZIDE 20-12.5 MG PO TABS: 1.0000 | ORAL_TABLET | Freq: Every day | ORAL | Status: DC

## 2014-02-14 MED ORDER — ATORVASTATIN CALCIUM 40 MG PO TABS: 40.0000 mg | ORAL_TABLET | Freq: Every day | ORAL | Status: DC

## 2014-02-14 MED ORDER — ALBUTEROL SULFATE HFA 108 (90 BASE) MCG/ACT IN AERS: 2.0000 | INHALATION_SPRAY | RESPIRATORY_TRACT | Status: DC | PRN

## 2014-02-14 MED ORDER — HYDROCODONE-ACETAMINOPHEN 5-325 MG PO TABS: 1.0000 | ORAL_TABLET | Freq: Four times a day (QID) | ORAL | Status: DC | PRN

## 2014-02-14 MED ORDER — PROMETHAZINE HCL 25 MG PO TABS: 25.0000 mg | ORAL_TABLET | Freq: Three times a day (TID) | ORAL | Status: DC | PRN

## 2014-02-14 MED ORDER — MELOXICAM 15 MG PO TABS: 15.0000 mg | ORAL_TABLET | Freq: Every day | ORAL | Status: DC

## 2014-02-14 NOTE — Progress Notes (Signed)
  Subjective:  Catherine Powell is a 48 y.o. female with a history of severe migraines here for pain management.  Intensity/Location: Unchanged migraines with pounding quality, severe, over whole head bilaterally with photophobia about 4/7 days per week lasting anywhere from 2 hours to 2 days. Only effective treatment has been hydrocodone.   Notes significant impairment on sleep, appetite, mood, relationships, concentration, physical activity when pain is present. This is mitigated by hydrocodone, she says her relationships are less strained now that she is less dependent on people to cover for her at work.  Denies prescriptions by other providers  - Review of Systems: HA, dizziness, vision or hearing changes, CP, SOB, abd pain, N/V/D, constipation, urinary problems, rashes, swollen joints - Medications: reviewed and updated - SH:  No substance abuse issues in the household Works as a temp intermittently.  History of substance abuse: None Current substance abuse: None Smoking history: Never - FH:  No substance abuse Objective:  Physical Exam: BP 177/89 mmHg  Pulse 80  Temp(Src) 97.9 F (36.6 C) (Oral)  Ht 5\' 9"  (1.753 m)  Wt 386 lb 12.8 oz (175.451 kg)  BMI 57.09 kg/m2  Gen:  48 y.o. female in NAD HEENT: MMM, EOMI, PERRL, anicteric sclerae CV: RRR, no MRG, no JVD Resp: Non-labored, CTAB, no wheezes noted Abd: Soft, NTND, BS present, no guarding or organomegaly MSK: No edema noted, full ROM Neuro: Alert and oriented, CN II-XII intact, speech normal, antalgic, wide gait Assessment:  Catherine Powell is a 48 y.o. female here for pain management.  Plan:  Chronic Pain Disorder:  - Progress toward pain control and promoting increased function: Good, stable. - Current Non-narcotic therapies: Exercises - Refilled hydrocodone x3 months - Discussed goals of opioid treatment including: significant improvement in functional status and not necessarily pain resolution.    - Risks communicated including: nausea, vomiting, confusion, sleepiness, fatigue, constipation, worsening sleep apnea, impairment of operation of equipment or vehicles, possible long-term effects on neuro-endocrine system.  - Written pain agreement discussed - Low opioid risk category.  - UDS - Follow up in 3 months.

## 2014-02-14 NOTE — Assessment & Plan Note (Signed)
-   Progress toward pain control and promoting increased function: Good, stable. - Current Non-narcotic therapies: Exercises - Refilled hydrocodone x3 months - UDS today - Follow up in 3 months.

## 2014-02-14 NOTE — Patient Instructions (Signed)
Always check this out before paying for prescriptions as they will likely have free coupons for you:  - [email or web address]

## 2014-02-15 LAB — DRUG SCREEN, URINE
Amphetamine Screen, Ur: NEGATIVE
BENZODIAZEPINES.: NEGATIVE
Barbiturate Quant, Ur: NEGATIVE
Cocaine Metabolites: NEGATIVE
Creatinine,U: 55.26 mg/dL
METHADONE: NEGATIVE
Marijuana Metabolite: NEGATIVE
Opiates: NEGATIVE
Phencyclidine (PCP): NEGATIVE
Propoxyphene: NEGATIVE

## 2014-02-16 ENCOUNTER — Other Ambulatory Visit: Payer: Self-pay | Admitting: *Deleted

## 2014-02-22 MED ORDER — BUDESONIDE-FORMOTEROL FUMARATE 160-4.5 MCG/ACT IN AERO: 1.0000 | INHALATION_SPRAY | Freq: Two times a day (BID) | RESPIRATORY_TRACT | Status: DC

## 2014-03-23 ENCOUNTER — Other Ambulatory Visit: Payer: Self-pay | Admitting: *Deleted

## 2014-03-23 MED ORDER — ESOMEPRAZOLE MAGNESIUM 40 MG PO CPDR: 40.0000 mg | DELAYED_RELEASE_CAPSULE | Freq: Every day | ORAL | Status: DC

## 2014-04-28 ENCOUNTER — Other Ambulatory Visit: Payer: Self-pay | Admitting: *Deleted

## 2014-04-28 DIAGNOSIS — J4530 Mild persistent asthma, uncomplicated: Secondary | ICD-10-CM

## 2014-04-28 MED ORDER — BUDESONIDE-FORMOTEROL FUMARATE 160-4.5 MCG/ACT IN AERO
1.0000 | INHALATION_SPRAY | Freq: Two times a day (BID) | RESPIRATORY_TRACT | Status: DC
Start: 2014-04-28 — End: 2017-09-03

## 2014-04-28 MED ORDER — ALBUTEROL SULFATE HFA 108 (90 BASE) MCG/ACT IN AERS: 2.0000 | INHALATION_SPRAY | RESPIRATORY_TRACT | Status: DC | PRN

## 2014-04-28 MED ORDER — ESOMEPRAZOLE MAGNESIUM 40 MG PO CPDR: 40.0000 mg | DELAYED_RELEASE_CAPSULE | Freq: Every day | ORAL | Status: DC

## 2014-04-28 NOTE — Telephone Encounter (Signed)
Please click the fax tab when refilling medication.  Clovis PuMartin, Tamika L, RN

## 2014-04-29 ENCOUNTER — Telehealth: Payer: Self-pay | Admitting: *Deleted

## 2014-04-29 NOTE — Telephone Encounter (Signed)
Catherine Powell form [facility] called needing clarification in pt's Symbicort directions. Rx sent for Symbicort inhale 1 puff BID.  Per Alvis LemmingsDawn is use to be 2 puffs BID.  Verbal order given by Dr. Jarvis NewcomerGrunz to change Symbicort to 2 puffs BID.  Clovis PuMartin, Tunya Held L, RN

## 2014-05-11 ENCOUNTER — Encounter: Payer: Self-pay | Admitting: Family Medicine

## 2014-05-11 ENCOUNTER — Ambulatory Visit (INDEPENDENT_AMBULATORY_CARE_PROVIDER_SITE_OTHER): Payer: Self-pay | Admitting: Family Medicine

## 2014-05-11 VITALS — BP 142/80 | HR 79 | Temp 97.6°F | Ht 68.5 in | Wt 393.0 lb

## 2014-05-11 DIAGNOSIS — J4530 Mild persistent asthma, uncomplicated: Secondary | ICD-10-CM

## 2014-05-11 DIAGNOSIS — E7800 Pure hypercholesterolemia, unspecified: Secondary | ICD-10-CM

## 2014-05-11 DIAGNOSIS — G894 Chronic pain syndrome: Secondary | ICD-10-CM

## 2014-05-11 DIAGNOSIS — G43109 Migraine with aura, not intractable, without status migrainosus: Secondary | ICD-10-CM

## 2014-05-11 DIAGNOSIS — Z7189 Other specified counseling: Secondary | ICD-10-CM

## 2014-05-11 DIAGNOSIS — Z0000 Encounter for general adult medical examination without abnormal findings: Secondary | ICD-10-CM

## 2014-05-11 DIAGNOSIS — J45901 Unspecified asthma with (acute) exacerbation: Secondary | ICD-10-CM

## 2014-05-11 DIAGNOSIS — I10 Essential (primary) hypertension: Secondary | ICD-10-CM

## 2014-05-11 DIAGNOSIS — G8929 Other chronic pain: Secondary | ICD-10-CM

## 2014-05-11 DIAGNOSIS — E559 Vitamin D deficiency, unspecified: Secondary | ICD-10-CM | POA: Insufficient documentation

## 2014-05-11 MED ORDER — PREDNISONE 50 MG PO TABS: 50.0000 mg | ORAL_TABLET | Freq: Every day | ORAL | Status: DC

## 2014-05-11 MED ORDER — HYDROCODONE-ACETAMINOPHEN 5-325 MG PO TABS: 1.0000 | ORAL_TABLET | Freq: Four times a day (QID) | ORAL | Status: DC | PRN

## 2014-05-11 NOTE — Assessment & Plan Note (Signed)
Denies rechecking of Vit D level and supplementation during this time of unemployment. Needs to recheck this. Dietary sources and limited sun exposure reviewed.

## 2014-05-11 NOTE — Assessment & Plan Note (Signed)
Last LP > 1 year ago; will recheck today to assess compliance with lipitor 40mg 

## 2014-05-11 NOTE — Patient Instructions (Addendum)
You need to get the orange card; make an appointment with Elenore RotaBarbara Macgregor for this on your way out - Take prednisone once a day in the morning for the next 3 days - Follow up in 3 months.   Hope you feel better soon! - Hazeline Junkeryan Antinette Keough, MD

## 2014-05-11 NOTE — Progress Notes (Signed)
  Subjective:   Catherine Powell is a 49 y.o. female with a history of migraines here for pain management.  Migraines: Reports 20 in the past 30 days. Squeezing, nauseating, photophobia, phonophobia. No specific time of day or other triggers. Bilateral near to crown of the skull. Severe, will last 6 hours - 72 hours. No alleviating factors.   Medication takes the edge off, allows for sleep. No changes in appetite, mood, relationships, concentration, physical activity  Any prescriptions by other providers: None  Also has had consistent cough without phlegm production or sore throat for the past 2 1/2 weeks. Still hacking despite antitussives, cough drops, honey and tea.   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  Lives with Husband and only child; has had a cough recently.  substance abuse issues in the household? None History of substance abuse: No alcohol, illicit drugs, prescription drugs.  Smoking history: No - FH:  No substance abuse  Objective:  Physical Exam: BP 142/80 mmHg  Pulse 79  Temp(Src) 97.6 F (36.4 C) (Oral)  Ht 5' 8.5" (1.74 m)  Wt 393 lb (178.264 kg)  BMI 58.88 kg/m2  LMP 05/15/2012  Gen: Morbidly obese 49 y.o. female in NAD HEENT: MMM, EOMI, PERRL, anicteric sclerae CV: RRR, no MRG, no JVD Resp: Non-labored, CTAB, no wheezes noted Abd: Soft, NTND, BS present, no guarding or organomegaly MSK: No edema noted, full ROM Neuro: Alert and oriented, speech normal  Assessment:     Catherine Powell is a 49 y.o. female here for pain management.     Plan:     See problem list for  problem-specific plans.

## 2014-05-11 NOTE — Assessment & Plan Note (Signed)
-   Progress toward pain control and promoting increased function: Has 4 job interviews next week and medication will allow this to be dependably available for this and further employment.  - Current Non-narcotic therapies: melatonin, CoQ10, relaxation therapies - Prescribed norco 5/325mg  #90 x 3 expected to last 3 months.  - Discussed goals of opioid treatment including: significant improvement in functional status and not necessarily pain resolution.  - Risks communicated including: nausea, vomiting, confusion, sleepiness, fatigue, constipation, worsening sleep apnea, impairment of operation of equipment or vehicles, possible long-term effects on neuro-endocrine system.  - Written pain agreement discussed and signed.  - Low opioid risk category.  - UDS reviewed: yes - Follow up in 3 months.

## 2014-05-11 NOTE — Assessment & Plan Note (Signed)
Slightly above goal today and pt reports not being on medications for ~ 3 months due to financial constraint. No symptoms. Will continue to reviewe DASH, exercise, etc.

## 2014-05-12 LAB — LIPID PANEL
CHOL/HDL RATIO: 4.7 ratio
Cholesterol: 218 mg/dL — ABNORMAL HIGH (ref 0–200)
HDL: 46 mg/dL (ref >=46)
LDL CALC: 140 mg/dL — AB (ref 0–99)
Triglycerides: 159 mg/dL — ABNORMAL HIGH (ref <150)
VLDL: 32 mg/dL (ref 0–40)

## 2014-05-13 ENCOUNTER — Telehealth: Payer: Self-pay | Admitting: Family Medicine

## 2014-05-13 ENCOUNTER — Encounter: Payer: Self-pay | Admitting: Family Medicine

## 2014-05-13 ENCOUNTER — Other Ambulatory Visit: Payer: Self-pay | Admitting: Family Medicine

## 2014-05-13 DIAGNOSIS — E7800 Pure hypercholesterolemia, unspecified: Secondary | ICD-10-CM

## 2014-05-13 MED ORDER — ATORVASTATIN CALCIUM 40 MG PO TABS: 40.0000 mg | ORAL_TABLET | Freq: Every day | ORAL | Status: DC

## 2014-05-13 NOTE — Telephone Encounter (Signed)
Pt called and would like a refill on her Lipitor to [facility] on [address] . jw

## 2014-05-13 NOTE — Telephone Encounter (Signed)
I'm not aware of and could not find under search function, a [facility] on [address]. This was sent to the default [facility] on [address].

## 2014-07-10 ENCOUNTER — Encounter: Payer: Self-pay | Admitting: Family Medicine

## 2014-07-11 NOTE — Telephone Encounter (Signed)
The integrative management of her migraines does require NSAIDs.  I have written a letter that should be sent to Catherine Powell so that she may present it to the appropriate parties.

## 2014-07-28 ENCOUNTER — Ambulatory Visit: Payer: Self-pay | Admitting: Family Medicine

## 2014-08-03 ENCOUNTER — Encounter: Payer: Self-pay | Admitting: Family Medicine

## 2014-08-03 ENCOUNTER — Ambulatory Visit: Payer: Self-pay | Admitting: Family Medicine

## 2014-08-16 ENCOUNTER — Other Ambulatory Visit: Payer: Self-pay | Admitting: Family Medicine

## 2014-08-16 DIAGNOSIS — G43109 Migraine with aura, not intractable, without status migrainosus: Secondary | ICD-10-CM

## 2014-08-16 NOTE — Telephone Encounter (Signed)
Mrs. Catherine Powell is calling to have her HYDROcodone-acetaminophen (NORCO/VICODIN) refilled as she may not be able to make her appointment with her PCP on Thursday. Please advise if possible. Thank you, Dorothey BasemanSadie Reynolds, ASA

## 2014-08-17 NOTE — Telephone Encounter (Signed)
She missed her appointment in late May due to her thinking it was a different day so I made this appointment and she said it would work. Please have her schedule an appointment and let me know when that is so I can prescribe enough to get her to that appointment.

## 2014-08-17 NOTE — Telephone Encounter (Signed)
Advised patient that she will need an appt pain medication refill.  She was informed that she missed the appt from May.  Pt stated she tried to reschedule her appt but PCP does not having anything until next week.  Advised patient pain medication will not be filled until she is seen by her PCP.  Clovis PuMartin, Saylor Sheckler L, RN

## 2014-08-18 ENCOUNTER — Ambulatory Visit (INDEPENDENT_AMBULATORY_CARE_PROVIDER_SITE_OTHER): Payer: BLUE CROSS/BLUE SHIELD | Admitting: Family Medicine

## 2014-08-18 ENCOUNTER — Telehealth: Payer: Self-pay | Admitting: Family Medicine

## 2014-08-18 ENCOUNTER — Encounter: Payer: Self-pay | Admitting: Family Medicine

## 2014-08-18 VITALS — BP 155/74 | HR 80 | Temp 97.4°F | Ht 68.5 in | Wt 397.0 lb

## 2014-08-18 DIAGNOSIS — J302 Other seasonal allergic rhinitis: Secondary | ICD-10-CM

## 2014-08-18 DIAGNOSIS — J4540 Moderate persistent asthma, uncomplicated: Secondary | ICD-10-CM

## 2014-08-18 DIAGNOSIS — G8929 Other chronic pain: Secondary | ICD-10-CM

## 2014-08-18 DIAGNOSIS — Z1239 Encounter for other screening for malignant neoplasm of breast: Secondary | ICD-10-CM

## 2014-08-18 DIAGNOSIS — I10 Essential (primary) hypertension: Secondary | ICD-10-CM

## 2014-08-18 DIAGNOSIS — Z7189 Other specified counseling: Secondary | ICD-10-CM | POA: Diagnosis not present

## 2014-08-18 DIAGNOSIS — E559 Vitamin D deficiency, unspecified: Secondary | ICD-10-CM | POA: Diagnosis not present

## 2014-08-18 DIAGNOSIS — G43109 Migraine with aura, not intractable, without status migrainosus: Secondary | ICD-10-CM

## 2014-08-18 MED ORDER — HYDROCODONE-ACETAMINOPHEN 5-325 MG PO TABS: 1.0000 | ORAL_TABLET | Freq: Four times a day (QID) | ORAL | Status: DC | PRN

## 2014-08-18 MED ORDER — NYSTATIN-TRIAMCINOLONE 100000-0.1 UNIT/GM-% EX OINT: 1.0000 "application " | TOPICAL_OINTMENT | Freq: Two times a day (BID) | CUTANEOUS | Status: DC

## 2014-08-18 MED ORDER — VITAMIN D (ERGOCALCIFEROL) 1.25 MG (50000 UNIT) PO CAPS: 50000.0000 [IU] | ORAL_CAPSULE | ORAL | Status: DC

## 2014-08-18 MED ORDER — OLOPATADINE HCL 0.1 % OP SOLN: 1.0000 [drp] | Freq: Two times a day (BID) | OPHTHALMIC | Status: DC

## 2014-08-18 MED ORDER — MONTELUKAST SODIUM 10 MG PO TABS: 10.0000 mg | ORAL_TABLET | Freq: Every day | ORAL | Status: DC

## 2014-08-18 NOTE — Assessment & Plan Note (Signed)
no increases in rescue medications or recent exacerbations, requires refill of singulair.

## 2014-08-18 NOTE — Patient Instructions (Signed)
Your mammogram has been ordered. Call at your leisure.  We can schedule a pap smear whenever you're available.  I have refilled norco x 3 months, patanol, nystatin, and singulair.  I am also sending in 12 weeks of vitamin D to take weekly.  I will see you in 3 months.

## 2014-08-18 NOTE — Assessment & Plan Note (Signed)
Pt strongly desires no repeat of lab (was very low in 2014) but she is obese, does not spend time outside and does not drink milk putting her at great risk of vitamin D deficiency so we will treat x 12 weeks THEN check the level.

## 2014-08-18 NOTE — Telephone Encounter (Signed)
Closing enctr

## 2014-08-18 NOTE — Assessment & Plan Note (Signed)
Stable on chronic medications, refilled for 3 months.

## 2014-08-18 NOTE — Assessment & Plan Note (Signed)
At goal on my manual recheck (138/80) on ACE/low dose HCTZ.

## 2014-08-18 NOTE — Assessment & Plan Note (Signed)
Stable with seasonal allergies for many years treated with antihistamine, singulair, and patanol for ocular symptoms.

## 2014-08-18 NOTE — Progress Notes (Signed)
Subjective: Catherine Powell is a 49 y.o. female patient of mine presenting for medication refills.  - Has been on patanol eye drops for 5 - 6 years seasonally due to environmental allergies. She is out of this. She also takes claritin.   - Focusing on stopping SSBs and commonly has inspiration from her husband's failing health to take care of herself. She is also taking her BP meds and lipid medications. Her BP has been normal at home since starting her BP meds back about 3 weeks ago (she couldn't afford it prior to that)  - Undergoing "the change," doesn't want any medications but thought I should be aware.   - Uses nystatin/triamcinolone on an intermittent redness on her left lower leg. She is out of this.   Now has insurance and is willing to proceed with healthcare maintenance.  - Mammogram: will get this soon - Colonoscopy, wants to defer this at this time. FH colon CA in mom and dad. Will get this done prior to her next birthday. No change in stool habits, caliber, no blood in stool or dark stools.  - HIV, Vit D: Doesn't want these checked. but says her Vit D  - Pap smear: denies having an abnormal test in the past. Will get this done at some point.   - ROS: Denies fever, headache, cough, CP, palpitations, SOB, abdominal pain, N/V/D, blood in stool, change in bladder habits, myalgias, arthralgias, and rash.  - Nonsmoker  Objective: BP 155/74 mmHg  Pulse 80  Temp(Src) 97.4 F (36.3 C) (Oral)  Ht 5' 8.5" (1.74 m)  Wt 397 lb (180.078 kg)  BMI 59.48 kg/m2  LMP 05/15/2012 Gen: Obese, well-appearing 50 y.o. female in no distress HEENT: MMM, posterior oropharynx clear Pulm: Non-labored; CTAB, no wheezes  CV: Regular rate, no murmur appreciated; distal pulses intact/symmetric GI: + BS; soft, non-tender, non-distended Skin: Very dry throughout, redness on bilateral ankles without significant edema, wounds, ulcers Neuro: A&Ox3, CN II-XII without  deficits  Assessment/Plan: Catherine Powell is a 49 y.o. female here for medication refills.  See problem list for plan.

## 2014-08-18 NOTE — Progress Notes (Signed)
Patient is due for a screening mammogram.  Information on imaging center given to patient to call and schedule mammogram appointment.  Jeanise Durfey Dawn, CMA   

## 2014-08-22 ENCOUNTER — Other Ambulatory Visit: Payer: Self-pay | Admitting: Family Medicine

## 2014-08-22 MED ORDER — IBUPROFEN 200 MG PO TABS
200.0000 mg | ORAL_TABLET | Freq: Four times a day (QID) | ORAL | Status: DC | PRN
Start: 2014-08-22 — End: 2015-11-17

## 2014-09-19 ENCOUNTER — Other Ambulatory Visit: Payer: Self-pay | Admitting: Family Medicine

## 2014-09-19 DIAGNOSIS — G43109 Migraine with aura, not intractable, without status migrainosus: Secondary | ICD-10-CM

## 2014-09-19 MED ORDER — HYDROCODONE-ACETAMINOPHEN 5-325 MG PO TABS: 1.0000 | ORAL_TABLET | Freq: Four times a day (QID) | ORAL | Status: DC | PRN

## 2014-09-26 ENCOUNTER — Ambulatory Visit (HOSPITAL_COMMUNITY): Payer: BLUE CROSS/BLUE SHIELD

## 2014-10-31 ENCOUNTER — Other Ambulatory Visit: Payer: Self-pay | Admitting: *Deleted

## 2014-10-31 DIAGNOSIS — E7800 Pure hypercholesterolemia, unspecified: Secondary | ICD-10-CM

## 2014-10-31 MED ORDER — ATORVASTATIN CALCIUM 40 MG PO TABS
40.0000 mg | ORAL_TABLET | Freq: Every day | ORAL | Status: DC
Start: 2014-10-31 — End: 2015-05-18

## 2014-12-20 ENCOUNTER — Other Ambulatory Visit: Payer: Self-pay | Admitting: Family Medicine

## 2014-12-20 ENCOUNTER — Encounter: Payer: Self-pay | Admitting: Family Medicine

## 2014-12-20 DIAGNOSIS — R110 Nausea: Secondary | ICD-10-CM

## 2014-12-20 NOTE — Telephone Encounter (Signed)
Needs refill on phengren  [facility] [address]

## 2014-12-21 ENCOUNTER — Encounter: Payer: Self-pay | Admitting: Family Medicine

## 2014-12-21 MED ORDER — PROMETHAZINE HCL 25 MG PO TABS: 25.0000 mg | ORAL_TABLET | Freq: Three times a day (TID) | ORAL | Status: DC | PRN

## 2014-12-21 NOTE — Telephone Encounter (Signed)
Last refill was on 02/14/14.  Patient need to have sent to pharmacy for new refill.  [facility] to [facility] [address].

## 2014-12-22 ENCOUNTER — Encounter: Payer: Self-pay | Admitting: Family Medicine

## 2014-12-22 ENCOUNTER — Ambulatory Visit (INDEPENDENT_AMBULATORY_CARE_PROVIDER_SITE_OTHER): Payer: BLUE CROSS/BLUE SHIELD | Admitting: Family Medicine

## 2014-12-22 VITALS — BP 154/75 | HR 85 | Temp 97.4°F | Ht 69.0 in | Wt 394.5 lb

## 2014-12-22 DIAGNOSIS — E559 Vitamin D deficiency, unspecified: Secondary | ICD-10-CM | POA: Diagnosis not present

## 2014-12-22 DIAGNOSIS — I10 Essential (primary) hypertension: Secondary | ICD-10-CM | POA: Diagnosis not present

## 2014-12-22 DIAGNOSIS — Z0000 Encounter for general adult medical examination without abnormal findings: Secondary | ICD-10-CM | POA: Diagnosis not present

## 2014-12-22 DIAGNOSIS — G43109 Migraine with aura, not intractable, without status migrainosus: Secondary | ICD-10-CM

## 2014-12-22 LAB — CBC
HCT: 40.3 % (ref 36.0–46.0)
Hemoglobin: 13.1 g/dL (ref 12.0–15.0)
MCH: 27.9 pg (ref 26.0–34.0)
MCHC: 32.5 g/dL (ref 30.0–36.0)
MCV: 85.9 fL (ref 78.0–100.0)
MPV: 9.5 fL (ref 8.6–12.4)
PLATELETS: 301 10*3/uL (ref 150–400)
RBC: 4.69 MIL/uL (ref 3.87–5.11)
RDW: 14.1 % (ref 11.5–15.5)
WBC: 7.8 10*3/uL (ref 4.0–10.5)

## 2014-12-22 LAB — BASIC METABOLIC PANEL WITH GFR
BUN: 8 mg/dL (ref 7–25)
CALCIUM: 8.7 mg/dL (ref 8.6–10.4)
CO2: 29 mmol/L (ref 20–31)
Chloride: 102 mmol/L (ref 98–110)
Creat: 0.72 mg/dL (ref 0.50–1.05)
Glucose, Bld: 87 mg/dL (ref 65–99)
Potassium: 4 mmol/L (ref 3.5–5.3)
SODIUM: 138 mmol/L (ref 135–146)

## 2014-12-22 MED ORDER — SUMATRIPTAN SUCCINATE 50 MG PO TABS: 50.0000 mg | ORAL_TABLET | ORAL | Status: DC | PRN

## 2014-12-22 MED ORDER — HYDROCODONE-ACETAMINOPHEN 5-325 MG PO TABS: 1.0000 | ORAL_TABLET | Freq: Four times a day (QID) | ORAL | Status: DC | PRN

## 2014-12-22 NOTE — Assessment & Plan Note (Signed)
Continuing norco prn and retrying imitrex. Given tramadol only in office today as she is driving home. Also will take phenergan when home.

## 2014-12-22 NOTE — Assessment & Plan Note (Signed)
Worsening today likely related to pain. Will reassess and check labs today.

## 2014-12-22 NOTE — Assessment & Plan Note (Signed)
Recheck today. s/p repletion.

## 2014-12-22 NOTE — Assessment & Plan Note (Signed)
Did not discuss today due to acute migraine.

## 2014-12-22 NOTE — Patient Instructions (Signed)
Thank you for coming in today!  We are checking some labs today, and we'll call you if they are abnormal. If you do not hear from me by phone or letter in 2 weeks, please call us as we may have been unable to reach you.   - Try imitrex (sumatriptan) for migraines and continue other medications.  - We will address your high blood pressure next time.   Our clinic's number is [phone]. Feel free to call any time with questions or concerns. We will answer any questions after hours with our 24-hour emergency line at that number as well.   - Dr. Jarvis NewcomerGrunz

## 2014-12-22 NOTE — Progress Notes (Signed)
Subjective: Catherine Powell is a 49 y.o. female patient of mine, accompanied by her husband as usual, presenting for medication refills.   Has been having a migraine for the last 4 days gradually worsening, 4/10 but severe most of the time before this morning. Bilateral, like an axe in her head at the crown, associated with nausea and 1 episode of vomiting without blood. Preceding vision changes/floaters were present. Denies auditory fullness/noises.   - Has not had BMP, CBC in > 1 year Hb A1c normal 10/19/2013 Vitamin D 02/01/2013 was 11, was treated with repletion doses for 12 weeks but never showed for recheck.  - Strongly declines colonoscopy and FOBT cards - Pap smear 04/06/2010: Negative cytology without cotesting - Mammogram 09/01/2007: Bi-RADS 1 - ROS: Denies fever, headache, cough, CP, palpitations, SOB, abdominal pain, N/V/D, blood in stool, change in bladder habits, myalgias, arthralgias, and rash.  - Non-smoker  Objective: BP 154/75 mmHg  Pulse 85  Temp(Src) 97.4 F (36.3 C) (Oral)  Ht 5\' 9"  (1.753 m)  Wt 394 lb 8 oz (178.944 kg)  BMI 58.23 kg/m2  LMP 05/15/2012 Gen: Obese 49 y.o. female in mild distress in partially darkened room CV: Regular rate, no murmur; radial, DP and PT pulses 2+ bilaterally; no LE edema, no JVD, cap refill < 2 sec. Neurologic Exam: Alert and oriented x4, CN II-XII without deficits, no focal deficits in sensation or motor function, patellar DTRs 2+ bilaterally. No dysmetria or dysdiadochokinesia. Fluent speech without aphasia. Romberg negative. No pronator drift.  Assessment/Plan: Catherine Powell is a 49 y.o. female here for migraines, acutely flaring.  Essential hypertension Worsening today likely related to pain. Will reassess and check labs today.   Migraine Continuing norco prn and retrying imitrex. Given tramadol only in office today as she is driving home. Also will take phenergan when home.   Vitamin D deficiency Recheck  today. s/p repletion.   Healthcare maintenance Did not discuss today due to acute migraine.

## 2014-12-23 LAB — VITAMIN D 25 HYDROXY (VIT D DEFICIENCY, FRACTURES): Vit D, 25-Hydroxy: 10 ng/mL — ABNORMAL LOW (ref 30–100)

## 2014-12-28 MED ORDER — VITAMIN D (ERGOCALCIFEROL) 1.25 MG (50000 UNIT) PO CAPS: 50000.0000 [IU] | ORAL_CAPSULE | ORAL | Status: DC

## 2014-12-28 NOTE — Addendum Note (Signed)
Addended by: Hazeline JunkerGRUNZ, Fitzhugh Vizcarrondo B on: 12/28/2014 07:59 PM   Modules accepted: Orders

## 2015-01-13 ENCOUNTER — Encounter: Payer: Self-pay | Admitting: Family Medicine

## 2015-01-16 ENCOUNTER — Ambulatory Visit (HOSPITAL_BASED_OUTPATIENT_CLINIC_OR_DEPARTMENT_OTHER)
Admission: RE | Admit: 2015-01-16 | Discharge: 2015-01-16 | Disposition: A | Discharge status: Home / Self Care | Payer: BLUE CROSS/BLUE SHIELD | Source: Ambulatory Visit | Attending: Family Medicine | Admitting: Family Medicine

## 2015-01-16 DIAGNOSIS — Z1239 Encounter for other screening for malignant neoplasm of breast: Secondary | ICD-10-CM

## 2015-01-16 DIAGNOSIS — Z1231 Encounter for screening mammogram for malignant neoplasm of breast: Secondary | ICD-10-CM | POA: Insufficient documentation

## 2015-01-20 ENCOUNTER — Other Ambulatory Visit: Payer: Self-pay | Admitting: Family Medicine

## 2015-01-20 ENCOUNTER — Encounter: Payer: Self-pay | Admitting: Family Medicine

## 2015-01-20 DIAGNOSIS — G43109 Migraine with aura, not intractable, without status migrainosus: Secondary | ICD-10-CM

## 2015-01-23 ENCOUNTER — Ambulatory Visit (INDEPENDENT_AMBULATORY_CARE_PROVIDER_SITE_OTHER): Payer: BLUE CROSS/BLUE SHIELD | Admitting: Family Medicine

## 2015-01-23 ENCOUNTER — Encounter: Payer: Self-pay | Admitting: Family Medicine

## 2015-01-23 VITALS — BP 120/78 | HR 68 | Temp 97.0°F | Ht 69.0 in | Wt 390.0 lb

## 2015-01-23 DIAGNOSIS — R21 Rash and other nonspecific skin eruption: Secondary | ICD-10-CM | POA: Diagnosis not present

## 2015-01-23 MED ORDER — HYDROXYZINE HCL 10 MG PO TABS: 10.0000 mg | ORAL_TABLET | Freq: Three times a day (TID) | ORAL | Status: DC | PRN

## 2015-01-23 MED ORDER — PREDNISONE 20 MG PO TABS: 20.0000 mg | ORAL_TABLET | Freq: Every day | ORAL | Status: DC

## 2015-01-23 MED ORDER — SUMATRIPTAN SUCCINATE 50 MG PO TABS: 50.0000 mg | ORAL_TABLET | ORAL | Status: DC | PRN

## 2015-01-23 MED ORDER — PERMETHRIN 5 % EX CREA: 1.0000 "application " | TOPICAL_CREAM | Freq: Once | CUTANEOUS | Status: DC

## 2015-01-23 NOTE — Patient Instructions (Addendum)
Use the hydroxyzine as needed for itching, it can make you very sleepy. Apply the permethrin cream to the entire body and leave on overnight (8-14 hours), then shower it off.   Mite infection, Adult Scabies is a skin condition that happens when very small insects get under the skin (infestation). This causes a rash and severe itchiness. Scabies can spread from person to person (is contagious). If you get scabies, it is common for others in your household to get scabies too. With proper treatment, symptoms usually go away in 2-4 weeks. Scabies usually does not cause lasting problems. CAUSES This condition is caused by mites (Sarcoptes scabiei, or human itch mites) that can only be seen with a microscope. The mites get into the top layer of skin and lay eggs. Scabies can spread from person to person through:  Close contact with a person who has scabies.  Contact with infested items, such as towels, bedding, or clothing. RISK FACTORS This condition is more likely to develop in:  People who live in nursing homes and other extended-care facilities.  People who have sexual contact with a partner who has scabies.  Young children who attend child care facilities.  People who care for others who are at increased risk for scabies. SYMPTOMS Symptoms of this condition may include:  Severe itchiness. This is often worse at night.  A rash that includes tiny red bumps or blisters. The rash commonly occurs on the wrist, elbow, armpit, fingers, waist, groin, or buttocks. Bumps may form a line (burrow) in some areas.  Skin irritation. This can include scaly patches or sores. DIAGNOSIS This condition is diagnosed with a physical exam. Your health care provider will look closely at your skin. In some cases, your health care provider may take a sample of your affected skin (skin scraping) and have it examined under a microscope. TREATMENT This condition may be treated with:  Medicated cream or lotion  that kills the mites. This is spread on the entire body and left on for several hours. Usually, one treatment with medicated cream or lotion is enough to kill all of the mites. In severe cases, the treatment may be repeated in 2 weeks.  Medicated cream that relieves itching.  Medicines that help to relieve itching.  Medicines that kill the mites. This treatment is rarely used. HOME CARE INSTRUCTIONS Medicines  Take or apply over-the-counter and prescription medicines as told by your health care provider.  Apply medicated cream or lotion as told by your health care provider.  Do not wash off the medicated cream or lotion until the necessary amount of time has passed. Skin Care  Avoid scratching your affected skin.  Keep your fingernails closely trimmed to reduce injury from scratching.  Take cool baths or apply cool washcloths to help reduce itching. General Instructions  Clean all items that you recently had contact with, including bedding, clothing, and furniture. Do this on the same day that your treatment starts.  Use hot water when you wash items.  Place unwashable items into closed, airtight plastic bags for at least 3 days. The mites cannot live for more than 3 days away from human skin.  Vacuum furniture and mattresses that you use.  Make sure that other people who may have been infested are examined by a health care provider. These include members of your household and anyone who may have had contact with infested items.  Keep all follow-up visits as told by your health care provider. This is important. SEEK  MEDICAL CARE IF:  You have itching that does not go away after 4 weeks of treatment.  You continue to develop new bumps or burrows.  You have redness, swelling, or pain in your rash area after treatment.  You have fluid, blood, or pus coming from your rash.   This information is not intended to replace advice given to you by your health care provider. Make  sure you discuss any questions you have with your health care provider.   Document Released: 11/16/2014 Document Reviewed: 09/27/2014 Elsevier Interactive Patient Education [employer]

## 2015-01-23 NOTE — Progress Notes (Signed)
Patient ID: Catherine SalvoBeverly A Powell, female   DOB: 12/16/1965, 49 y.o.   MRN: [id number]    Subjective: CC: rash  HPI: Patient is a 49 y.o. female presenting to clinic today for a same day appointment for a rash.  RASH Had rash for 5 days. Location: Started on abdomen and back and then progressed to arms and legs  Medications tried: Benadryl PO, Aveeno bath, and hydrocortisone Similar rash in past: No No one else at home has the rash.  New medications or antibiotics: Only vitamin D  Tick, Insect or new pet exposure: No Recent travel: No  New detergent or soap: No  Immunocompromised: No   Symptoms Itching: Yes, very much.  Pain over rash: No  Feeling ill all over: No  Fever: No  Mouth sores: No  Face or tongue swelling: No  Trouble breathing: No  Joint swelling or pain: No   Social History: never smoker  Health Maintenance: declines flu vaccine today given rash  ROS: All other systems reviewed and are negative.  Past Medical History Patient Active Problem List   Diagnosis Date Noted  . Rash and nonspecific skin eruption 01/23/2015  . Healthcare maintenance 12/22/2014  . Vitamin D deficiency 05/11/2014  . Encounter for chronic pain management 05/11/2014  . Chronic pain syndrome 10/23/2012  . Degenerative joint disease of knees 05/01/2010  . CERVICAL RADICULOPATHY, LEFT 08/06/2007  . Migraine 11/19/2006  . Essential hypertension 05/22/2006  . HYPERCHOLESTEROLEMIA 05/08/2006  . OBESITY, NOS 05/08/2006  . Allergic rhinitis 05/08/2006  . Asthma, chronic 05/08/2006  . GASTROESOPHAGEAL REFLUX, NO ESOPHAGITIS 05/08/2006    Medications- reviewed and updated Current Outpatient Prescriptions  Medication Sig Dispense Refill  . albuterol (VENTOLIN HFA) 108 (90 BASE) MCG/ACT inhaler Inhale 2 puffs into the lungs every 4 (four) hours as needed for wheezing. 3.7 g 3  . atorvastatin (LIPITOR) 40 MG tablet Take 1 tablet (40 mg total) by mouth daily. 90 tablet 3  .  budesonide-formoterol (SYMBICORT) 160-4.5 MCG/ACT inhaler Inhale 1 Powell into the lungs 2 (two) times daily. 1 Inhaler 11  . esomeprazole (NEXIUM) 40 MG capsule Take 1 capsule (40 mg total) by mouth daily before breakfast. 30 capsule 11  . HYDROcodone-acetaminophen (NORCO) 5-325 MG tablet Take 1 tablet by mouth every 6 (six) hours as needed for moderate pain. 90 tablet 0  . HYDROcodone-acetaminophen (NORCO) 5-325 MG tablet Take 1 tablet by mouth every 6 (six) hours as needed for moderate pain. 90 tablet 0  . HYDROcodone-acetaminophen (NORCO/VICODIN) 5-325 MG tablet Take 1 tablet by mouth every 6 (six) hours as needed. 90 tablet 0  . hydrOXYzine (ATARAX/VISTARIL) 10 MG tablet Take 1 tablet (10 mg total) by mouth 3 (three) times daily as needed for itching. 30 tablet 0  . ibuprofen (ADVIL,MOTRIN) 200 MG tablet Take 1 tablet (200 mg total) by mouth every 6 (six) hours as needed. 90 tablet 3  . meloxicam (MOBIC) 15 MG tablet Take 1 tablet (15 mg total) by mouth daily. 30 tablet 5  . montelukast (SINGULAIR) 10 MG tablet Take 1 tablet (10 mg total) by mouth at bedtime. 30 tablet 3  . olopatadine (PATANOL) 0.1 % ophthalmic solution Place 1 drop into both eyes 2 (two) times daily. 5 mL 6  . permethrin (ACTICIN) 5 % cream Apply 1 application topically once. 60 g 0  . predniSONE (DELTASONE) 20 MG tablet Take 1 tablet (20 mg total) by mouth daily with breakfast. 14 tablet 0  . promethazine (PHENERGAN) 25 MG tablet Take 1 tablet (  25 mg total) by mouth every 8 (eight) hours as needed for nausea or vomiting. 40 tablet 0  . quinapril-hydrochlorothiazide (ACCURETIC) 20-12.5 MG per tablet Take 1 tablet by mouth daily. 30 tablet 11  . SUMAtriptan (IMITREX) 50 MG tablet Take 1 tablet (50 mg total) by mouth every 2 (two) hours as needed for migraine. May repeat in 2 hours if headache persists or recurs. 5 tablet 0  . Vitamin D, Ergocalciferol, (DRISDOL) 50000 UNITS CAPS capsule Take 1 capsule (50,000 Units total) by mouth  every 7 (seven) days. 12 capsule 1   No current facility-administered medications for this visit.    Objective: Office vital signs reviewed. BP 120/78 mmHg  Pulse 68  Temp(Src) 97 F (36.1 C) (Oral)  Ht  (1.753 m)  Wt 390 lb (176.903 kg)  BMI 57.57 kg/m2  SpO2 98%  LMP 05/15/2012   Physical Examination:  General: Awake, alert, well- nourished, NAD Skin: Erythematous maculopapular rash located over the anterior chest wall, most prominent below the breasts, over the lower back, in the axilla, and on the posterior thighs and ankles. Some excoriations noted as well as a few scabbed over papules. None note between the digits. No drainage noted. No joint swelling.   Assessment/Plan: Rash and nonspecific skin eruption Given the diffuse location, appearance, and pruritic nature, it is concerning for scabies.  It is odd that no one else in the home has this rash and it is not interdigitally. Rash examined with Dr. McDiarmid. No evidence of superimposed bacterial infection.  - Rx for permethrin cream 5%  - Given diffuse nature, Rx for prednisone  x 14 days, discussed possible rebound. - Hydroxyzine PRN pruritus, discussed drowsiness - Discussed home care: caring for linens, etc. - RTC if worsening.     No orders of the defined types were placed in this encounter.    Meds ordered this encounter  Medications  . permethrin (ACTICIN) 5 % cream    Sig: Apply 1 application topically once.    Dispense:  60 g    Refill:  0  . predniSONE (DELTASONE) 20 MG tablet    Sig: Take 1 tablet (20 mg total) by mouth daily with breakfast.    Dispense:  14 tablet    Refill:  0  . hydrOXYzine (ATARAX/VISTARIL) 10 MG tablet    Sig: Take 1 tablet (10 mg total) by mouth 3 (three) times daily as needed for itching.    Dispense:  30 tablet    Refill:  0    Catherine Powell PGY-2, [facility]

## 2015-01-23 NOTE — Progress Notes (Signed)
Pt decides to hold off on flu shot today due to rash.  She will call later to make RN visit. [name], Maryjo RochesterJessica Dawn

## 2015-01-23 NOTE — Assessment & Plan Note (Signed)
Given the diffuse location, appearance, and pruritic nature, it is concerning for scabies.  It is odd that no one else in the home has this rash and it is not interdigitally. Rash examined with Dr. McDiarmid. No evidence of superimposed bacterial infection.  - Rx for permethrin cream 5%  - Given diffuse nature, Rx for prednisone 20mg  x 14 days, discussed possible rebound. - Hydroxyzine PRN pruritus, discussed drowsiness - Discussed home care: caring for linens, etc. - RTC if worsening.

## 2015-04-17 ENCOUNTER — Other Ambulatory Visit: Payer: Self-pay | Admitting: Family Medicine

## 2015-04-20 ENCOUNTER — Encounter: Payer: Self-pay | Admitting: Family Medicine

## 2015-04-20 ENCOUNTER — Ambulatory Visit (INDEPENDENT_AMBULATORY_CARE_PROVIDER_SITE_OTHER): Payer: Managed Care, Other (non HMO) | Admitting: Family Medicine

## 2015-04-20 VITALS — BP 139/87 | HR 78 | Temp 98.0°F | Ht 69.0 in | Wt 390.5 lb

## 2015-04-20 DIAGNOSIS — Z23 Encounter for immunization: Secondary | ICD-10-CM | POA: Diagnosis not present

## 2015-04-20 DIAGNOSIS — G43109 Migraine with aura, not intractable, without status migrainosus: Secondary | ICD-10-CM

## 2015-04-20 DIAGNOSIS — G8929 Other chronic pain: Secondary | ICD-10-CM

## 2015-04-20 DIAGNOSIS — Z7189 Other specified counseling: Secondary | ICD-10-CM

## 2015-04-20 MED ORDER — HYDROCODONE-ACETAMINOPHEN 5-325 MG PO TABS: 1.0000 | ORAL_TABLET | Freq: Four times a day (QID) | ORAL | Status: DC | PRN

## 2015-04-20 MED ORDER — KETOROLAC TROMETHAMINE 30 MG/ML IJ SOLN
30.0000 mg | Freq: Once | INTRAMUSCULAR | Status: AC
  Administered 2015-04-20: 30 mg via INTRAMUSCULAR

## 2015-04-20 MED ORDER — DEXAMETHASONE SODIUM PHOSPHATE 10 MG/ML IJ SOLN
10.0000 mg | Freq: Once | INTRAMUSCULAR | Status: AC
  Administered 2015-04-20: 10 mg via INTRAMUSCULAR

## 2015-04-20 NOTE — Assessment & Plan Note (Signed)
Chronic daily migraines: Longstanding history treated with narcotics for years, though she is aware this is not an evidence-based treatment. She has failed propranolol, amitriptyline, topiramate ppx and multiple triptans.  - Refer to neurology, defer imaging to their discretion - Limit rescue medications to 3 or, preferably, fewer administrations per week to r/o rebound headache. - Refill norco Acute headache: Nonfocal exam without papilledema or HTN and typical migraine features.  - Decadron, toradol given in office. She is driving, so phenergan and benadryl not given, though she has these at home and will take them later.

## 2015-04-20 NOTE — Progress Notes (Signed)
Subjective: Catherine Powell is a 50 y.o. female presenting for headache.  Reports 22 days per month she has a headache, on the top, "head in a vise," severe, associated with visual distortion and floaters prior to headache onset, nausea, photophobia and phonophobia. Takes excedrin migraine first, then norco. Relpax did not help in the past. Takes medications at least 3 times per week for headache. She was evaluated for headaches by Dr. Ignacia Palma at the headache clinic at [facility], but lost insurance and quit going there. Symptoms sometimes worse with stress.   Currently has a moderate headache 5/10 of the same character, has been out of abortive medications so hasn't tried anything. No vomiting or imbalance.    She has tried tolectin DS prophylctic therapy and had allergic reaction to it. Took propranolol daily for 6 months without improvement. Took amitriptyline without improvement in 1988. Tried topamax without improvement during that time as well.  - H/o cervical surgery with plates and screws in 2009. No neck pain.  - Non-smoker  Objective: BP 139/87 mmHg  Pulse 78  Temp(Src) 98 F (36.7 C) (Oral)  Ht  (1.753 m)  Wt 390 lb 8 oz (177.13 kg)  BMI 57.64 kg/m2  LMP 05/15/2012 Gen: Obese 50 y.o. female in evident pain Neuro: Alert and oriented x4, CN II-XII without deficits, no papilledema on fundoscopy, no focal deficits in sensation or motor function, patellar DTRs 2+ bilaterally. No dysmetria or dysdiadochokinesia. Fluent speech without aphasia. Gait normal.   Assessment/Plan: Catherine Powell is a 50 y.o. female here for migraines.  Migraine Chronic daily migraines: Longstanding history treated with narcotics for years, though she is aware this is not an evidence-based treatment. She has failed propranolol, amitriptyline, topiramate ppx and multiple triptans.  - Refer to neurology, defer imaging to their discretion - Limit rescue medications to 3 or, preferably, fewer  administrations per week to r/o rebound headache. - Refill norco Acute headache: Nonfocal exam without papilledema or HTN and typical migraine features.  - Decadron, toradol given in office. She is driving, so phenergan and benadryl not given, though she has these at home and will take them later.  - Flu shot today

## 2015-04-20 NOTE — Patient Instructions (Signed)
-   I have referred you to a neurologist to get another opinion about migraine treatment and prevention. Make sure not to take NSAIDs (including aspirin/excedrin) more than 3 times per week, preferably less.  - Keep taking abortive medications, which I have refilled, and try to rest.  - Make sure to schedule the physical whenever you feel up to it.   Take care! - Dr. Reece Agar

## 2015-05-18 ENCOUNTER — Encounter: Payer: Self-pay | Admitting: Family Medicine

## 2015-05-18 ENCOUNTER — Telehealth: Payer: Self-pay | Admitting: Family Medicine

## 2015-05-18 ENCOUNTER — Other Ambulatory Visit (HOSPITAL_COMMUNITY)
Admission: RE | Admit: 2015-05-18 | Discharge: 2015-05-18 | Disposition: A | Discharge status: Home / Self Care | Payer: Managed Care, Other (non HMO) | Source: Ambulatory Visit | Attending: Family Medicine | Admitting: Family Medicine

## 2015-05-18 ENCOUNTER — Ambulatory Visit (INDEPENDENT_AMBULATORY_CARE_PROVIDER_SITE_OTHER): Payer: Managed Care, Other (non HMO) | Admitting: Family Medicine

## 2015-05-18 VITALS — BP 150/81 | HR 73 | Temp 98.0°F | Ht 69.0 in | Wt 394.5 lb

## 2015-05-18 DIAGNOSIS — E669 Obesity, unspecified: Secondary | ICD-10-CM

## 2015-05-18 DIAGNOSIS — Z114 Encounter for screening for human immunodeficiency virus [HIV]: Secondary | ICD-10-CM

## 2015-05-18 DIAGNOSIS — E559 Vitamin D deficiency, unspecified: Secondary | ICD-10-CM | POA: Diagnosis not present

## 2015-05-18 DIAGNOSIS — Z1159 Encounter for screening for other viral diseases: Secondary | ICD-10-CM

## 2015-05-18 DIAGNOSIS — I10 Essential (primary) hypertension: Secondary | ICD-10-CM

## 2015-05-18 DIAGNOSIS — Z1151 Encounter for screening for human papillomavirus (HPV): Secondary | ICD-10-CM | POA: Diagnosis present

## 2015-05-18 DIAGNOSIS — Z01419 Encounter for gynecological examination (general) (routine) without abnormal findings: Secondary | ICD-10-CM | POA: Insufficient documentation

## 2015-05-18 DIAGNOSIS — Z124 Encounter for screening for malignant neoplasm of cervix: Secondary | ICD-10-CM

## 2015-05-18 DIAGNOSIS — Z0000 Encounter for general adult medical examination without abnormal findings: Secondary | ICD-10-CM

## 2015-05-18 DIAGNOSIS — E7800 Pure hypercholesterolemia, unspecified: Secondary | ICD-10-CM

## 2015-05-18 LAB — LIPID PANEL
CHOLESTEROL: 209 mg/dL — AB (ref 125–200)
HDL: 43 mg/dL — ABNORMAL LOW (ref >=46)
LDL Cholesterol: 135 mg/dL — ABNORMAL HIGH (ref <130)
TRIGLYCERIDES: 153 mg/dL — AB (ref <150)
Total CHOL/HDL Ratio: 4.9 Ratio (ref <=5.0)
VLDL: 31 mg/dL — AB (ref <30)

## 2015-05-18 LAB — POCT GLYCOSYLATED HEMOGLOBIN (HGB A1C): Hemoglobin A1C: 5.2

## 2015-05-18 MED ORDER — ATORVASTATIN CALCIUM 40 MG PO TABS: 40.0000 mg | ORAL_TABLET | Freq: Every day | ORAL | Status: DC

## 2015-05-18 MED ORDER — QUINAPRIL-HYDROCHLOROTHIAZIDE 20-12.5 MG PO TABS: 1.0000 | ORAL_TABLET | Freq: Every day | ORAL | Status: DC

## 2015-05-18 MED ORDER — NYSTATIN-TRIAMCINOLONE 100000-0.1 UNIT/GM-% EX OINT: 1.0000 "application " | TOPICAL_OINTMENT | Freq: Two times a day (BID) | CUTANEOUS | Status: DC

## 2015-05-18 NOTE — Patient Instructions (Signed)
Thank you for coming in today!  We are checking some labs today, and we'll call you if they are abnormal. If you do not hear from me by phone or letter in 2 weeks, please call us as we may have been unable to reach you.    - Keep considering the colonoscopy OR the fecal occult blood tests.  Our clinic's number is [phone]. Feel free to call any time with questions or concerns. We will answer any questions after hours with our 24-hour emergency line at that number as well.   - Dr. Jarvis NewcomerGrunz

## 2015-05-18 NOTE — Telephone Encounter (Signed)
After hours Telephone call  Patient calls to report that she was seen in clinic today by Dr. Jarvis NewcomerGrunz.  Patient reports that she was able to figure out vicodin confusion and able to get it filled today.  Dr. Jarvis NewcomerGrunz can speak to pharmacy for more information.  She needs refills on Triamcinolone-nystatin, Cholesterol and BP meds and these are not at pharmacy, despite thinking they were sent in today.  Will send refills over now.  Erasmo DownerAngela M Bacigalupo, MD, MPH PGY-2,  [facility] 05/18/2015 7:15 PM

## 2015-05-18 NOTE — Progress Notes (Signed)
   Subjective:  Catherine Powell is a 50 y.o. female with a history of chronic headaches here for an annual gynecological exam.  She is morbidly obese, and has recently decided that it's now or never time to lose weight by eating better and developing a more active lifestyle. This is hampered by her knee pain with walking.   Postmenopausal, LMP 05/15/2012. Denies vaginal bleeding, discharge, pain with sex with her husband. No dysuria.   Last mammogram 01/16/2015, BiRADS 1.  Breast mass or concerns: No Last pap smear: 04/06/2010, normal without cotesting.  History of abnormal pap smear: No   - Review of Systems: Per HPI. All other systems reviewed and are negative. - Past medical, family and social histories as well as medications were reviewed and updated.  Objective:  BP 150/81 mmHg  Pulse 73  Temp(Src) 98 F (36.7 C) (Oral)  Ht 5\' 9"  (1.753 m)  Wt 394 lb 8 oz (178.944 kg)  BMI 58.23 kg/m2  LMP 05/15/2012  Gen: Morbidly obese 50 y.o. female in NAD HEENT: MMM, EOMI, PERRL, anicteric sclerae, nares patent, oropharynx clear, no thyromegaly CV: RRR, no MRG, no JVD, 2+ radial and DP pulses bilaterally Resp: Non-labored breathing ambient air, CTAB, no wheezes noted Breasts: Appear normal, no suspicious masses, no skin or nipple changes or axillary nodes on palpation. Abd: Soft, NTND, BS present, no guarding or organomegaly Neuro: Alert and oriented, speech normal Pelvic: External genitalia within normal limits. Vaginal mucosa pink, moist, normal rugae. Nonfriable cervix without lesions, no discharge or bleeding noted on speculum exam. Bimanual exam revealed normal, nongravid uterus.  No cervical motion tenderness. No adnexal masses bilaterally.    April Zimmerman Rumple, CMA present throughout duration of exam.   Assessment/Plan:  Catherine Powell is a 50 y.o. female here for annual gynecologic exam.   Health maintenance:  - STD screening: Declines - Pap smear:  Performed with cotesting - Mammogram: Done - Vaccinations: UTD - Lipid screening: Today - DEXA scan not indicated - Referred for colonoscopy. - Follow up in 1 month for weight loss counseling.  OBESITY, NOS Counseled on SMART goals for lifestyle changes, offered support. We will continue to discuss this at office visits as it will help ALL of her problems including her primary concern - her headaches.  - Lipid panel checked at last visit, ASCVD 10-year risk 3.5%, thus not technically in statin benefit group. She has a protective HDL.  - Recheck Hb A1c to ensure she's still not diabetic.

## 2015-05-19 LAB — HIV ANTIBODY (ROUTINE TESTING W REFLEX): HIV 1&2 Ab, 4th Generation: NONREACTIVE

## 2015-05-19 LAB — HEPATITIS C ANTIBODY: HCV Ab: NEGATIVE

## 2015-05-19 NOTE — Telephone Encounter (Signed)
Thank you for sending those in.

## 2015-05-22 ENCOUNTER — Ambulatory Visit (INDEPENDENT_AMBULATORY_CARE_PROVIDER_SITE_OTHER): Payer: Managed Care, Other (non HMO) | Admitting: Neurology

## 2015-05-22 ENCOUNTER — Encounter: Payer: Self-pay | Admitting: Neurology

## 2015-05-22 VITALS — BP 148/76 | HR 88 | Ht 68.5 in | Wt 394.0 lb

## 2015-05-22 DIAGNOSIS — G43119 Migraine with aura, intractable, without status migrainosus: Secondary | ICD-10-CM

## 2015-05-22 DIAGNOSIS — G43709 Chronic migraine without aura, not intractable, without status migrainosus: Secondary | ICD-10-CM

## 2015-05-22 DIAGNOSIS — IMO0002 Reserved for concepts with insufficient information to code with codable children: Secondary | ICD-10-CM

## 2015-05-22 LAB — CYTOLOGY - PAP

## 2015-05-22 MED ORDER — NYSTATIN 100000 UNIT/GM EX POWD: CUTANEOUS | Status: DC

## 2015-05-22 NOTE — Patient Instructions (Signed)
Due to the complexity of your migraines and having exhausted so many available therapies, I recommend continuing to see a headache specialist.  We will refer you.  [place] [facility] Anise SalvoMarshal Freeman [facility] Dr. Amelia JoEliot Lewit Also, continue diet and exercising

## 2015-05-22 NOTE — Assessment & Plan Note (Signed)
Counseled on SMART goals for lifestyle changes, offered support. We will continue to discuss this at office visits as it will help ALL of her problems including her primary concern - her headaches.  - Lipid panel checked at last visit, ASCVD 10-year risk 3.5%, thus not technically in statin benefit group. She has a protective HDL.  - Recheck Hb A1c to ensure she's still not diabetic.

## 2015-05-22 NOTE — Progress Notes (Signed)
NEUROLOGY CONSULTATION NOTE  Catherine SalvoBeverly A Powell MRN: [id number] DOB: [birth year]  Referring provider: Dr. Jarvis NewcomerGrunz Primary care provider: Dr. Jarvis NewcomerGrunz  Reason for consult:  Chronic migraine  HISTORY OF PRESENT ILLNESS: Catherine Powell is a 50 year old right-handed female who presents for migraine.  History obtained by patient and PCP note.   Onset:  50 years old Location:  crown Quality:  Pounding/stabbing Intensity:  "14"/10 Aura:  Sees squiggly lines about 4-6 hours prior to onset of headache Prodrome:  no Associated symptoms:  Nausea, vomiting, photophobia, phonophobia, osmophobia, eyes tear Duration:  Usually 2 days (up to one week) Frequency:  20 or more days of headache per month Triggers/exacerbating factors:  none Relieving factors:  none Activity:  Cannot function 4-6 days per month  Past NSAIDS:  diclofenac 75mg , ibuprofen, naproxen Past analgesics:  Midrin, Fioricet, Ultracet, tramadol, Stadol, Lidocaine drops Past abortive triptans:  Sumatriptan tablet/NS, Maxalt, Relpax, Zomig tablet/NS Past muscle relaxants:  Tizanidine, Flexeril, baclofen (drowsiness) Past anti-emetic:  Zofran Past anti-anxiolytic:  Valium Past sleep aide:  Ambien (sleepwalk) Past antihypertensive medications:  Lasix, Lisinopril, metoprolol  Past antidepressant medications:  Nortriptyline, amitriptyline, Effexor Past anticonvulsant medications:  Topiramate, Depakote, gabapentin, Lamictal Past vitamins/Herbal/Supplements:  Magnesium, riboflavin Past antihistamines/decongestants:  Flonase Other past management:  Biofeedback.  She did not want to try Botox due to concerns of dysphagia and plate in cervical spine from surgery  Current NSAIDS:  meloxicam Current analgesics:  Norco, Tylenol Current triptans:  sumatriptan 50mg  Current anti-emetic:  Phenergan 25mg  Current muscle relaxants:  none Current Antihypertensive medications:  quinapril-HCTZ Current Antidepressant medications:   none Current Anticonvulsant medications:  none Current Vitamins/Herbal/Supplements:  coenzyme Q10 30mg  twice daily Current Antihistamines/Decongestants:  Sudafed  First line is Excedrin or Tylenol, second line is Norco.  She takes pain relievers 4-5 days per week.  Caffeine:  no Alcohol:  rarely Smoker:  no Diet:  Seldom diet soda.  Modified diet.  Trying to drink more water Exercise:  Walks 3 times a week Depression/stress:  no Sleep hygiene:  4-6 hours a night Family history of headache:  Mother, son  PAST MEDICAL HISTORY: Past Medical History  Diagnosis Date  . ASTHMA, PERSISTENT, MILD 05/08/2006    Qualifier: Diagnosis of  By: Lanier PrudeBolden  MD, Cathrine Musteraineisha    . Degenerative joint disease of knees 05/01/2010    Qualifier: Diagnosis of  By: Christella HartiganJacobs MD, Fae PippinBret    . Depression 12/16/2006    Qualifier: Diagnosis of  By: Irving BurtonLeininger MD, Clifton CustardAaron    . ESSENTIAL HYPERTENSION 05/22/2006    Qualifier: Diagnosis of  By: Irving BurtonLeininger MD, Clifton CustardAaron    . GASTROESOPHAGEAL REFLUX, NO ESOPHAGITIS 05/08/2006    Qualifier: Diagnosis of  By: Irving BurtonLeininger MD, Clifton CustardAaron    . HYPERCHOLESTEROLEMIA 05/08/2006    Qualifier: Diagnosis of  By: Irving BurtonLeininger MD, Clifton CustardAaron    . MIGRAINE NOS W/INTRACTABLE MIGRAINE 11/19/2006    [facility] but currently cannot afford to go back.  Has been tried on multiple medications to minimal effect.  Hydrocodone appears to be the only medication that can abort headaches.    . Rheumatic fever   . SINUSITIS, CHRONIC, NOS 05/08/2006  . Carpal tunnel syndrome 05/08/2006    PAST SURGICAL HISTORY: No past surgical history on file.  MEDICATIONS: Current Outpatient Prescriptions on File Prior to Visit  Medication Sig Dispense Refill  . albuterol (VENTOLIN HFA) 108 (90 BASE) MCG/ACT inhaler Inhale 2 puffs into the lungs every 4 (four) hours as needed for wheezing. 3.7 g 3  .  atorvastatin (LIPITOR) 40 MG tablet Take 1 tablet (40 mg total) by mouth daily. 90 tablet 1  . budesonide-formoterol (SYMBICORT)  160-4.5 MCG/ACT inhaler Inhale 1 puff into the lungs 2 (two) times daily. 1 Inhaler 11  . co-enzyme Q-10 30 MG capsule Take 30 mg by mouth 2 (two) times daily.    Marland Kitchen HYDROcodone-acetaminophen (NORCO) 5-325 MG tablet Take 1 tablet by mouth every 6 (six) hours as needed for moderate pain. 90 tablet 0  . ibuprofen (ADVIL,MOTRIN) 200 MG tablet Take 1 tablet (200 mg total) by mouth every 6 (six) hours as needed. 90 tablet 3  . Melatonin 10 MG TABS Take 10 mg by mouth daily. Take once daily at bedtime    . meloxicam (MOBIC) 15 MG tablet Take 1 tablet (15 mg total) by mouth daily. 30 tablet 5  . montelukast (SINGULAIR) 10 MG tablet Take 1 tablet (10 mg total) by mouth at bedtime. 30 tablet 3  . nystatin-triamcinolone ointment (MYCOLOG) Apply 1 application topically 2 (two) times daily. 30 g 0  . olopatadine (PATANOL) 0.1 % ophthalmic solution Place 1 drop into both eyes 2 (two) times daily. 5 mL 6  . quinapril-hydrochlorothiazide (ACCURETIC) 20-12.5 MG tablet Take 1 tablet by mouth daily. 30 tablet 1  . Vitamin D, Ergocalciferol, (DRISDOL) 50000 UNITS CAPS capsule Take 1 capsule (50,000 Units total) by mouth every 7 (seven) days. 12 capsule 1  . esomeprazole (NEXIUM) 40 MG capsule Take 1 capsule (40 mg total) by mouth daily before breakfast. 30 capsule 11  . promethazine (PHENERGAN) 25 MG tablet Take 1 tablet (25 mg total) by mouth every 8 (eight) hours as needed for nausea or vomiting. 40 tablet 0   No current facility-administered medications on file prior to visit.    ALLERGIES: Allergies  Allergen Reactions  . Tolectin Ds [Tolmetin Sodium] Hives and Shortness Of Breath    FAMILY HISTORY: Family History  Problem Relation Age of Onset  . Migraines Mother     SOCIAL HISTORY: Social History   Social History  . Marital Status: Married    Spouse Name: [name]  . Number of Children: [name]  . Years of Education: [name]   Occupational History  . Not on file.   Social History Main Topics  .  Smoking status: Never Smoker   . Smokeless tobacco: Never Used  . Alcohol Use: Yes  . Drug Use: No  . Sexual Activity: Yes   Other Topics Concern  . Not on file   Social History Narrative    REVIEW OF SYSTEMS: Constitutional: No fevers, chills, or sweats, no generalized fatigue, change in appetite Eyes: No visual changes, double vision, eye pain Ear, nose and throat: No hearing loss, ear pain, nasal congestion, sore throat Cardiovascular: No chest pain, palpitations Respiratory:  No shortness of breath at rest or with exertion, wheezes GastrointestinaI: No nausea, vomiting, diarrhea, abdominal pain, fecal incontinence Genitourinary:  No dysuria, urinary retention or frequency Musculoskeletal:  No neck pain, back pain Integumentary: No rash, pruritus, skin lesions Neurological: as above Psychiatric: No depression, insomnia, anxiety Endocrine: No palpitations, fatigue, diaphoresis, mood swings, change in appetite, change in weight, increased thirst Hematologic/Lymphatic:  No anemia, purpura, petechiae. Allergic/Immunologic: no itchy/runny eyes, nasal congestion, recent allergic reactions, rashes  PHYSICAL EXAM: Filed Vitals:   05/22/15 1007  BP: 148/76  Pulse: 88   General: No acute distress.  Patient appears well-groomed.  Morbidly obese Head:  Normocephalic/atraumatic Eyes:  fundi examined but not visualized Neck: supple, no paraspinal tenderness, full range  of motion Back: No paraspinal tenderness Heart: regular rate and rhythm Lungs: Clear to auscultation bilaterally. Vascular: No carotid bruits. Neurological Exam: Mental status: alert and oriented to person, place, and time, recent and remote memory intact, fund of knowledge intact, attention and concentration intact, speech fluent and not dysarthric, language intact. Cranial nerves: CN I: not tested CN II: pupils equal, round and reactive to light, legally blind in left eye but visual fields intact, fundi  unremarkable, without vessel changes, exudates, hemorrhages or papilledema. CN III, IV, VI:  full range of motion, no nystagmus, no ptosis CN V: facial sensation intact CN VII: upper and lower face symmetric CN VIII: hearing intact CN IX, X: gag intact, uvula midline CN XI: sternocleidomastoid and trapezius muscles intact CN XII: tongue midline Bulk & Tone: normal, no fasciculations. Motor:  5/5 throughout  Sensation: temperature and vibration sensation intact. Deep Tendon Reflexes:  2+ throughout, toes downgoing.  Finger to nose testing:  Without dysmetria.  Heel to shin:  Without dysmetria.  Gait:  Normal station and stride.  Able to turn, unable to walk in tandem. Romberg negative.  IMPRESSION: Chronic migraine Morbid obesity  PLAN: Due to complexity of her migraines and failure or potential contraindications to treatment, Catherine Powell would best be managed by a headache specialist.  We will refer her. In meantime, she should continue lifestyle modification:  Limit pain reliever use, weight loss, increase exercise  45 minutes spent face to face with patient, over 50% spent discussing management.  Thank you for allowing me to take part in the care of this patient.  Shon Millet, DO  CC:  Hazeline Junker, MD

## 2015-06-27 ENCOUNTER — Encounter: Payer: Self-pay | Admitting: Family Medicine

## 2015-06-27 ENCOUNTER — Ambulatory Visit (INDEPENDENT_AMBULATORY_CARE_PROVIDER_SITE_OTHER): Payer: Managed Care, Other (non HMO) | Admitting: Family Medicine

## 2015-06-27 VITALS — BP 136/64 | HR 66 | Temp 97.9°F | Ht 68.5 in | Wt 398.4 lb

## 2015-06-27 DIAGNOSIS — I8311 Varicose veins of right lower extremity with inflammation: Secondary | ICD-10-CM

## 2015-06-27 DIAGNOSIS — I872 Venous insufficiency (chronic) (peripheral): Secondary | ICD-10-CM

## 2015-06-27 DIAGNOSIS — I8312 Varicose veins of left lower extremity with inflammation: Secondary | ICD-10-CM

## 2015-06-27 DIAGNOSIS — E669 Obesity, unspecified: Secondary | ICD-10-CM

## 2015-06-27 DIAGNOSIS — R600 Localized edema: Secondary | ICD-10-CM

## 2015-06-27 MED ORDER — FUROSEMIDE 40 MG PO TABS
40.0000 mg | ORAL_TABLET | Freq: Every day | ORAL | Status: DC
Start: 2015-06-27 — End: 2016-03-18

## 2015-06-27 NOTE — Progress Notes (Signed)
Subjective: Catherine Powell is a 50 y.o. female with a history of obesity and venous insufficiency.  She complains of 2 weeks of worsening and constant bilateral legs swelling L > R now aching severely and occasionally itching consistent with prior episodes of dependent edema treated with lasix in the past. This condition is worsened by inability to elevate legs at work. Incidentally, she stopped taking quinapril-HCTZ around this time because she felt it was hurting her back. Her back now feels normal.   - ROS: Denies fever, chills, HA, vision changes, palpitations, chest pain, dyspnea, abd pain, N/V/D, dysuria, polyuria, immobility, Dx CA, personal or family history of blood clots.  - Non-smoker - No PMH kidney, liver or heart disease (did have rheumatic heart disease as a child).  Objective: BP 136/64 mmHg  Pulse 66  Temp(Src) 97.9 F (36.6 C) (Oral)  Ht 5' 8.5" (1.74 m)  Wt 398 lb 6.4 oz (180.713 kg)  BMI 59.69 kg/m2  LMP 05/15/2012 Gen: Pleasant, morbidly obese 50 y.o. female in no distress CV: Regular rate, no murmur; radial, DP and PT pulses 2+ bilaterally; no JVD, cap refill < 2 sec. Pulm: Non-labored breathing ambient air; CTAB, no wheezes or crackles GI: Normoactive-BS; soft, obese, non-tender, non-distended, no organomegaly/RUQ tenderness Ext: Bilateral lower extremities with symmetric 2+ pitting edema superimprosed on increased adiposity. Small (~5cm x 5cm) irregular patch of dermatitis noted on anterior right lower leg without ulcer, bullae, or wounds. No erythema. Negative homan's.   Assessment/Plan: Catherine Powell is a 50 y.o. female here for venous insufficiency.  Chronic venous insufficiency Restart HCTZ Discussed venous insufficiency, and the contribution of obesity to this.  - Advised weight loss, leg elevation, and compression stockings (pt reports none will fit her) - Will give short trial of lasix to help as there is significant pain from taut  skin and dermatitis developing.

## 2015-06-27 NOTE — Patient Instructions (Signed)
Thank you for coming in today!  - Take lasix as directed for no more than 1 week, keeping feet elevated - If you develop any wounds, or swelling does not improve, please bring them to our attention.  - Restart accuretic as this will help with swelling and your blood pressure.   Our clinic's number is [phone]. Feel free to call any time with questions or concerns. We will answer any questions after hours with our 24-hour emergency line at that number as well.   - Dr. Jarvis NewcomerGrunz

## 2015-06-27 NOTE — Assessment & Plan Note (Signed)
Restart HCTZ Discussed venous insufficiency, and the contribution of obesity to this.  - Advised weight loss, leg elevation, and compression stockings (pt reports none will fit her) - Will give short trial of lasix to help as there is significant pain from taut skin and dermatitis developing.

## 2015-06-30 ENCOUNTER — Encounter: Payer: Self-pay | Admitting: Family Medicine

## 2015-07-18 ENCOUNTER — Other Ambulatory Visit: Payer: Self-pay | Admitting: Family Medicine

## 2015-07-20 ENCOUNTER — Other Ambulatory Visit: Payer: Self-pay | Admitting: Family Medicine

## 2015-07-20 DIAGNOSIS — G43109 Migraine with aura, not intractable, without status migrainosus: Secondary | ICD-10-CM

## 2015-07-20 MED ORDER — HYDROCODONE-ACETAMINOPHEN 5-325 MG PO TABS: 1.0000 | ORAL_TABLET | Freq: Four times a day (QID) | ORAL | Status: DC | PRN

## 2015-07-21 NOTE — Progress Notes (Signed)
Pt informed that pain Rx was ready for pickup. Lamonte SakaiZimmerman Rumple, Delton Stelle D, [place]

## 2015-07-27 ENCOUNTER — Ambulatory Visit (INDEPENDENT_AMBULATORY_CARE_PROVIDER_SITE_OTHER): Payer: Managed Care, Other (non HMO) | Admitting: Family Medicine

## 2015-07-27 ENCOUNTER — Encounter: Payer: Self-pay | Admitting: Family Medicine

## 2015-07-27 VITALS — BP 137/69 | HR 82 | Temp 98.2°F | Ht 68.5 in | Wt 393.7 lb

## 2015-07-27 DIAGNOSIS — Z7189 Other specified counseling: Secondary | ICD-10-CM

## 2015-07-27 DIAGNOSIS — Z0000 Encounter for general adult medical examination without abnormal findings: Secondary | ICD-10-CM | POA: Diagnosis not present

## 2015-07-27 DIAGNOSIS — G8929 Other chronic pain: Secondary | ICD-10-CM

## 2015-07-27 DIAGNOSIS — E559 Vitamin D deficiency, unspecified: Secondary | ICD-10-CM

## 2015-07-27 DIAGNOSIS — G43109 Migraine with aura, not intractable, without status migrainosus: Secondary | ICD-10-CM

## 2015-07-27 MED ORDER — HYDROCODONE-ACETAMINOPHEN 5-325 MG PO TABS: 1.0000 | ORAL_TABLET | Freq: Four times a day (QID) | ORAL | Status: DC | PRN

## 2015-07-27 MED ORDER — NYSTATIN-TRIAMCINOLONE 100000-0.1 UNIT/GM-% EX OINT: 1.0000 "application " | TOPICAL_OINTMENT | Freq: Two times a day (BID) | CUTANEOUS | Status: DC

## 2015-07-27 NOTE — Progress Notes (Signed)
Subjective: Catherine Powell is a 50 y.o. female presenting for follow up of rash and chronic pain management.   She reports improvement and near resolution of the rash with the application of ointment prescribed at last visit. Her leg swelling has improved, and she reports no adverse effects of taking her BP medications as directed.   She continues to require narcotic pain medications for intermittent headache/migraines. Medications allow her to make it to work on days (most of the days in a given month) when she has a severe headache. She was evaluated by neurology as recently as March 2017, who referred her to a headache specialist. She has not been to that appointment yet.   - ROS: Denies fever, chills, weight loss, changes in vision or hearing, chest pain, palpitations, shortness of breath, abdominal pain, nausea, vomiting, changes in bowel habits, blood in stool, change in bladder habits, myalgias, arthralgias, and rash.  - Non-smoker  Objective: BP 137/69 mmHg  Pulse 82  Temp(Src) 98.2 F (36.8 C) (Oral)  Ht 5' 8.5" (1.74 m)  Wt 393 lb 11.2 oz (178.581 kg)  BMI 58.98 kg/m2  LMP 05/15/2012 Gen: Morbidly obese 50 y.o. female in no distress CV: Regular rate, no murmur; radial, DP and PT pulses 2+ bilaterally; trace symmetric LE edema, no JVD, cap refill < 2 sec. Pulm: Non-labored breathing ambient air; CTAB, no wheezes or crackles Neuro: Alert and oriented x4, CN II-XII without deficits, no focal deficits in sensation or motor function, patellar DTRs 2+ bilaterally. No dysmetria or dysdiadochokinesia. Fluent speech without aphasia. Gait antalgic. Romberg negative. No pronator drift.  Assessment/Plan: Catherine Powell is a 50 y.o. female here for chronic pain management.  Encounter for chronic pain management - Refill narcotics. Will be due for UDS and pain contract with new PCP in July. She will discuss further treatment options with headache specialist.   Vitamin D  deficiency - Will be due for recheck of vitamin D s/p repletion (2 courses, the second of which was started Oct 2016).   Healthcare maintenance - Again declines colonoscopy

## 2015-07-27 NOTE — Patient Instructions (Signed)
Thank you for coming in today!  - Keep applying nystatin ointment as needed  - Next time you come in we'll re-sign the pain contract and get a urine drug screen - You are due for your colonoscopy Our clinic's number is [phone]. Feel free to call any time with questions or concerns. We will answer any questions after hours with our 24-hour emergency line at that number as well.   - Dr. Jarvis NewcomerGrunz

## 2015-07-28 NOTE — Assessment & Plan Note (Signed)
-   Refill narcotics. Will be due for UDS and pain contract with new PCP in July. She will discuss further treatment options with headache specialist.

## 2015-07-28 NOTE — Assessment & Plan Note (Signed)
-   Will be due for recheck of vitamin D s/p repletion (2 courses, the second of which was started Oct 2016).

## 2015-08-29 ENCOUNTER — Ambulatory Visit: Payer: Managed Care, Other (non HMO) | Admitting: Family Medicine

## 2015-09-08 ENCOUNTER — Ambulatory Visit (INDEPENDENT_AMBULATORY_CARE_PROVIDER_SITE_OTHER): Payer: Managed Care, Other (non HMO) | Admitting: Family Medicine

## 2015-09-08 ENCOUNTER — Encounter: Payer: Self-pay | Admitting: Family Medicine

## 2015-09-08 VITALS — BP 134/72 | HR 64 | Temp 98.1°F | Ht 69.0 in | Wt 398.8 lb

## 2015-09-08 DIAGNOSIS — E7800 Pure hypercholesterolemia, unspecified: Secondary | ICD-10-CM

## 2015-09-08 DIAGNOSIS — I10 Essential (primary) hypertension: Secondary | ICD-10-CM | POA: Diagnosis not present

## 2015-09-08 DIAGNOSIS — E559 Vitamin D deficiency, unspecified: Secondary | ICD-10-CM

## 2015-09-08 MED ORDER — ATORVASTATIN CALCIUM 40 MG PO TABS: 40.0000 mg | ORAL_TABLET | Freq: Every day | ORAL | Status: DC

## 2015-09-08 MED ORDER — QUINAPRIL-HYDROCHLOROTHIAZIDE 20-12.5 MG PO TABS: 1.0000 | ORAL_TABLET | Freq: Every day | ORAL | Status: DC

## 2015-09-08 NOTE — Assessment & Plan Note (Signed)
Recommend Vit D level recheck at follow up.

## 2015-09-08 NOTE — Progress Notes (Signed)
Subjective: Catherine Powell is a 50 y.o. female here for hypertension follow up.   She rarely checks BP at home, but takes medications 100% of the time. Denies side effects, cough. She's found it difficult to find time to exercise but has tried to get her husband to walk with her daily with little effect. Adheres to a low salt diet.   - ROS: Denies CP, SOB, palpitations, syncope, dizziness, orthopnea, PND, frequent headaches, vision changes, claudication, leg swelling. - PMFSH: Married, non-smoker, no EtOH, no illicit drugs. - Medications: reviewed and updated  Objective: BP 134/72 mmHg  Pulse 64  Temp(Src) 98.1 F (36.7 C) (Oral)  Ht 5\' 9"  (1.753 m)  Wt 398 lb 12.8 oz (180.894 kg)  BMI 58.87 kg/m2  LMP 05/15/2012 Gen: Well-appearing 49 y.o.female in no distress Neck: brisk carotid upstroke, no bruits; thyroid not enlarged  Pulm: Non-labored breathing room air; CTAB CV: Regular rate with normal S1/S2, no murmur; 1+ LE edema, no JVD; DP and radial pulses symmetric and 2+. Homan's sign absent. cap refill < 2 sec. GI: Nontender, nondistended, no HSM   Assessment & Plan: Catherine SalvoBeverly A Hooker-Schueler is a 50 y.o. female here for hypertension.  See problem list.

## 2015-09-08 NOTE — Assessment & Plan Note (Signed)
Controlled. Continue current management.  - Primary elements of DASH diet and weight loss strategies reviewed in detail. Handout given.

## 2015-09-08 NOTE — Assessment & Plan Note (Signed)
Medication refilled today, no myalgias. Will need lipid rechecked at next office visit (had to go before lab today to get to work on time).

## 2015-09-08 NOTE — Patient Instructions (Signed)
- High blood pressure is best treated by losing weight, taking medications as directed, avoiding salt in your diet and increasing potassium from fruits and vegetables (called the DASH diet). - Follow up in 2 weeks to discuss pain management with your new PCP, Dr. Alanda SlimGonfa, or sooner if you have any concerns. If you feel faint, experience new/worsening chest pain or shortness of breath, or notice rapid leg swelling and/or weight gain you should call the clinic at [phone] OR go directly to the ER.  Take care,  - Dr. Jarvis NewcomerGrunz  DASH Eating Plan DASH stands for "Dietary Approaches to Stop Hypertension." The DASH eating plan is a healthy eating plan that has been shown to reduce high blood pressure (hypertension). Additional health benefits may include reducing the risk of type 2 diabetes mellitus, heart disease, and stroke. The DASH eating plan may also help with weight loss. WHAT DO I NEED TO KNOW ABOUT THE DASH EATING PLAN? For the DASH eating plan, you will follow these general guidelines:  Choose foods with a percent daily value for sodium of less than 5% (as listed on the food label).  Use salt-free seasonings or herbs instead of table salt or sea salt.  Check with your health care provider or pharmacist before using salt substitutes.  Eat lower-sodium products, often labeled as "lower sodium" or "no salt added."  Eat fresh foods.  Eat more vegetables, fruits, and low-fat dairy products.  Choose whole grains. Look for the word "whole" as the first word in the ingredient list.  Choose fish and skinless chicken or Mal[place]urkey more often than red meat. Limit fish, poultry, and meat to 6 oz (170 g) each day.  Limit sweets, desserts, sugars, and sugary drinks.  Choose heart-healthy fats.  Limit cheese to 1 oz (28 g) per day.  Eat more home-cooked food and less restaurant, buffet, and fast food.  Limit fried foods.  Cook foods using methods other than frying.  Limit canned vegetables. If you  do use them, rinse them well to decrease the sodium.  When eating at a restaurant, ask that your food be prepared with less salt, or no salt if possible. WHAT FOODS CAN I EAT? Seek help from a dietitian for individual calorie needs. Grains Whole grain or whole wheat bread. Brown rice. Whole grain or whole wheat pasta. Quinoa, bulgur, and whole grain cereals. Low-sodium cereals. Corn or whole wheat flour tortillas. Whole grain cornbread. Whole grain crackers. Low-sodium crackers. Vegetables Fresh or frozen vegetables (raw, steamed, roasted, or grilled). Low-sodium or reduced-sodium tomato and vegetable juices. Low-sodium or reduced-sodium tomato sauce and paste. Low-sodium or reduced-sodium canned vegetables.  Fruits All fresh, canned (in natural juice), or frozen fruits. Meat and Other Protein Products Ground beef (85% or leaner), grass-fed beef, or beef trimmed of fat. Skinless chicken or [place]key. Ground chicken or [place]urkey. Pork trimmed of fat. All fish and seafood. Eggs. Dried beans, peas, or lentils. Unsalted nuts and seeds. Unsalted canned beans. Dairy Low-fat dairy products, such as skim or 1% milk, 2% or reduced-fat cheeses, low-fat ricotta or cottage cheese, or plain low-fat yogurt. Low-sodium or reduced-sodium cheeses. Fats and Oils Tub margarines without trans fats. Light or reduced-fat mayonnaise and salad dressings (reduced sodium). Avocado. Safflower, olive, or canola oils. Natural peanut or almond butter. Other Unsalted popcorn and pretzels.  WHAT FOODS ARE NOT RECOMMENDED? Grains White bread. White pasta. White rice. Refined cornbread. Bagels and croissants. Crackers that contain trans fat. Vegetables Creamed or fried vegetables. Vegetables in a cheese sauce.  Regular canned vegetables. Regular canned tomato sauce and paste. Regular tomato and vegetable juices. Fruits Dried fruits. Canned fruit in light or heavy syrup. Fruit juice. Meat and Other Protein Products Fatty cuts of  meat. Ribs, chicken wings, bacon, sausage, bologna, salami, chitterlings, fatback, hot dogs, bratwurst, and packaged luncheon meats. Salted nuts and seeds. Canned beans with salt. Dairy Whole or 2% milk, cream, half-and-half, and cream cheese. Whole-fat or sweetened yogurt. Full-fat cheeses or blue cheese. Nondairy creamers and whipped toppings. Processed cheese, cheese spreads, or cheese curds. Condiments Onion and garlic salt, seasoned salt, table salt, and sea salt. Canned and packaged gravies. Worcestershire sauce. Tartar sauce. Barbecue sauce. Teriyaki sauce. Soy sauce, including reduced sodium. Steak sauce. Fish sauce. Oyster sauce. Cocktail sauce. Horseradish. Ketchup and mustard. Meat flavorings and tenderizers. Bouillon cubes. Hot sauce. Tabasco sauce. Marinades. Taco seasonings. Relishes. Fats and Oils Butter, stick margarine, lard, shortening, ghee, and bacon fat. Coconut, palm kernel, or palm oils. Regular salad dressings. Other Pickles and olives. Salted popcorn and pretzels. The items listed above may not be a complete list of foods and beverages to avoid. Contact your dietitian for more information. WHERE CAN I FIND MORE INFORMATION? National Heart, Lung, and Blood Institute: CablePromo.itwww.nhlbi.nih.gov/health/health-topics/topics/dash/

## 2015-09-16 ENCOUNTER — Other Ambulatory Visit: Payer: Self-pay | Admitting: Family Medicine

## 2015-09-16 DIAGNOSIS — G43109 Migraine with aura, not intractable, without status migrainosus: Secondary | ICD-10-CM

## 2015-09-20 MED ORDER — HYDROCODONE-ACETAMINOPHEN 5-325 MG PO TABS: 1.0000 | ORAL_TABLET | Freq: Four times a day (QID) | ORAL | Status: DC | PRN

## 2015-09-21 NOTE — Telephone Encounter (Signed)
Pt informed Rx is up front for pick up. She made an Appt for Aug 8th to see Dr. Alanda SlimGonfa. Sunday SpillersSharon T Breeze Angell, CMA

## 2015-09-22 ENCOUNTER — Ambulatory Visit: Payer: Managed Care, Other (non HMO) | Admitting: Student

## 2015-10-17 ENCOUNTER — Encounter: Payer: Self-pay | Admitting: Student

## 2015-10-17 ENCOUNTER — Ambulatory Visit (INDEPENDENT_AMBULATORY_CARE_PROVIDER_SITE_OTHER): Payer: Managed Care, Other (non HMO) | Admitting: Student

## 2015-10-17 VITALS — BP 151/80 | HR 67 | Temp 98.6°F | Ht 69.0 in | Wt 399.4 lb

## 2015-10-17 DIAGNOSIS — G43109 Migraine with aura, not intractable, without status migrainosus: Secondary | ICD-10-CM | POA: Diagnosis not present

## 2015-10-17 DIAGNOSIS — E7800 Pure hypercholesterolemia, unspecified: Secondary | ICD-10-CM | POA: Diagnosis not present

## 2015-10-17 DIAGNOSIS — I10 Essential (primary) hypertension: Secondary | ICD-10-CM | POA: Diagnosis not present

## 2015-10-17 DIAGNOSIS — E559 Vitamin D deficiency, unspecified: Secondary | ICD-10-CM

## 2015-10-17 DIAGNOSIS — J4530 Mild persistent asthma, uncomplicated: Secondary | ICD-10-CM

## 2015-10-17 DIAGNOSIS — G8929 Other chronic pain: Secondary | ICD-10-CM

## 2015-10-17 DIAGNOSIS — Z7189 Other specified counseling: Secondary | ICD-10-CM

## 2015-10-17 MED ORDER — HYDROCODONE-ACETAMINOPHEN 5-325 MG PO TABS: 1.0000 | ORAL_TABLET | Freq: Four times a day (QID) | ORAL | 0 refills | Status: DC | PRN

## 2015-10-17 MED ORDER — QUINAPRIL-HYDROCHLOROTHIAZIDE 20-12.5 MG PO TABS: 1.0000 | ORAL_TABLET | Freq: Every day | ORAL | 3 refills | Status: DC

## 2015-10-17 MED ORDER — ALBUTEROL SULFATE HFA 108 (90 BASE) MCG/ACT IN AERS: 2.0000 | INHALATION_SPRAY | RESPIRATORY_TRACT | 3 refills | Status: DC | PRN

## 2015-10-17 MED ORDER — ATORVASTATIN CALCIUM 40 MG PO TABS: 40.0000 mg | ORAL_TABLET | Freq: Every day | ORAL | 3 refills | Status: DC

## 2015-10-17 NOTE — Assessment & Plan Note (Addendum)
Patient with history of cervical radiculopathy status post fixation of C5-C7. Also history of chronic ankle pain secondary to trauma as well as history of refractory migraines. She has been on Norco 5/325 chronically. Last UDS in 2015 although she reports having a UDS recently. Last pain contract in 2015 as well. Otherwise, chronic pain is stable. No red flags. She reports walking daily to lose weight.  -Refilled her prescription for Norco to be taken every 6 hours as needed for pain. Given #90 -Defer UDS today as she hasn't taken her medication the last 3 days.  -UDS and pain contract when she returns in a month. I've already discussed this with her. -Follow up in 1 month

## 2015-10-17 NOTE — Assessment & Plan Note (Signed)
Status post and vitamin D replacement therapy.  -We will check vitamin D level today

## 2015-10-17 NOTE — Assessment & Plan Note (Addendum)
Blood pressure mildly elevated to 151/80 today. This was taken right after she walked into the room. Reports taking her medication. Will continue medication as it is. We may consider beta blocker given history of refractory migraine. Last BMP about 3 months ago within normal limits

## 2015-10-17 NOTE — Progress Notes (Signed)
   Subjective:    Patient ID: Catherine SalvoBeverly A Powell is a 50 y.o. old female.  CC: medication refills  HPI #Hypertension: reports elevated BP preceding a migraine headache. Reports good compliance with the medication. Denies chest pain, shortness of breath, orthopnea or diaphoresis  #Hyperlipidemia: reports taking lipitor consistently. Tolerating her Lipitor well. She denies myalgia  #chronic pain: neck pain, right ankle pain, left knee pain. These are all chronic. Denies any change. She takes Norco. Last dose about three days ago.   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   Review of Systems Per HPI Objective:   Vitals:   10/17/15 1348  BP: (!) 151/80  Pulse: 67  Temp: 98.6 F (37 C)  TempSrc: Oral  Weight: (!) 399 lb 6.4 oz (181.2 kg)  Height: 5\' 9"  (1.753 m)    GEN: appears obese, no apparent distress. CVS: RRR, normal s1 and s2, no murmurs, no edema RESP: no increased work of breathing, good air movement bilaterally, no crackles or wheeze GI: soft, non-tender,non-distended, +BS Skin: Dry scaly skin in the lower extremities MSK: No tenderness to palpation over her spines or paraspinal muscles. No tenderness to palpation in her lower extremities including her uncles.  NEURO: alert and oriented appropriately, no gross defecits  PSYCH: appropriate mood and affect     Assessment & Plan:  Essential hypertension Blood pressure mildly elevated to 151/80 today. This was taken right after she walked into the room. Reports taking her medication. Will continue medication as it is. We may consider beta blocker given history of refractory migraine. Last BMP about 3 months ago within normal limits  HYPERCHOLESTEROLEMIA Tolerating Lipitor very well. Denies myalgia. We will continue her atorvastatin and check her lipid panel today  Encounter for chronic pain management Patient with history of cervical radiculopathy status post fixation of C5-C7.  Also history of chronic ankle pain secondary to trauma as well as history of refractory migraines. She has been on Norco 5/325 chronically. Last UDS in 2015 although she reports having a UDS recently. Last pain contract in 2015 as well. Otherwise, chronic pain is stable. No red flags. She reports walking daily to lose weight.  -Refilled her prescription for Norco to be taken every 6 hours as needed for pain. Given #90 -Defer UDS today as she hasn't taken her medication the last 3 days.  -UDS and pain contract when she returns in a month. I've already discussed this with her. -Follow up in 1 month  Vitamin D deficiency Status post and vitamin D replacement therapy.  -We will check vitamin D level today

## 2015-10-17 NOTE — Patient Instructions (Addendum)
It was great seeing you today! We have addressed the following issues today  1. High blood pressure: I have refilled her blood pressure medication today 2. Cholesterol: I have refilled his cholesterol medication today. I will check your cholesterol level as well 3. Chronic pain: I have given you prescription for Norco for one month supply. Please come back and see me in one month. I'm glad you have started walking and exercising. Keep it up! 4. Vitamin D deficiency: I'm checking a vitamin D level today 5.   Screening for colon cancer: it is highly recommended that you have colon cancer screening.     If we did any lab work today, and the results require attention, either me or my nurse will get in touch with you. If everything is normal, you will get a letter in mail. If you don't hear from us in two weeks, [place] give us a call. Otherwise, I look forward to talking with you again at our next visit. If you have any questions or concerns before then, please call the clinic at [phone].  Please bring all your medications to every doctors visit   Sign up for My Chart to have easy access to your labs results, and communication with your Primary care physician.    Please check-out at the front desk before leaving the clinic.   Take Care,

## 2015-10-17 NOTE — Assessment & Plan Note (Signed)
Tolerating Lipitor very well. Denies myalgia. We will continue her atorvastatin and check her lipid panel today

## 2015-10-18 ENCOUNTER — Telehealth: Payer: Self-pay | Admitting: Student

## 2015-10-18 DIAGNOSIS — E559 Vitamin D deficiency, unspecified: Secondary | ICD-10-CM

## 2015-10-18 LAB — LIPID PANEL
CHOL/HDL RATIO: 4.2 ratio (ref <=5.0)
CHOLESTEROL: 203 mg/dL — AB (ref 125–200)
HDL: 48 mg/dL (ref >=46)
LDL Cholesterol: 131 mg/dL — ABNORMAL HIGH (ref <130)
TRIGLYCERIDES: 119 mg/dL (ref <150)
VLDL: 24 mg/dL (ref <30)

## 2015-10-18 LAB — VITAMIN D 25 HYDROXY (VIT D DEFICIENCY, FRACTURES): Vit D, 25-Hydroxy: 11 ng/mL — ABNORMAL LOW (ref 30–100)

## 2015-10-18 MED ORDER — VITAMIN D3 1.25 MG (50000 UT) PO TABS: 1.0000 | ORAL_TABLET | ORAL | 0 refills | Status: DC

## 2015-10-18 NOTE — Telephone Encounter (Signed)
Called and talked to patient about her vitamin D level and cholesterol.   Patient's vitamin D level is low at 11. I have sent a prescription for vitamin D 50,000 international units weekly for 8 weeks and advised patient to pick the prescription and take it. Patient may need to be on daily dose of vitamin D after that. I will repeat her vitamin D level in about 3 months.   Patient's cholesterol has some improvement compared to 5 months ago. Advised patient to continue taking her Lipitor as it is.

## 2015-11-17 ENCOUNTER — Encounter: Payer: Self-pay | Admitting: Student

## 2015-11-17 ENCOUNTER — Ambulatory Visit (INDEPENDENT_AMBULATORY_CARE_PROVIDER_SITE_OTHER): Payer: Managed Care, Other (non HMO) | Admitting: Student

## 2015-11-17 VITALS — BP 139/69 | HR 70 | Temp 97.7°F | Ht 69.0 in | Wt >= 6400 oz

## 2015-11-17 DIAGNOSIS — Z23 Encounter for immunization: Secondary | ICD-10-CM

## 2015-11-17 DIAGNOSIS — M5412 Radiculopathy, cervical region: Secondary | ICD-10-CM

## 2015-11-17 DIAGNOSIS — G8929 Other chronic pain: Secondary | ICD-10-CM

## 2015-11-17 DIAGNOSIS — M170 Bilateral primary osteoarthritis of knee: Secondary | ICD-10-CM | POA: Diagnosis not present

## 2015-11-17 DIAGNOSIS — Z7189 Other specified counseling: Secondary | ICD-10-CM

## 2015-11-17 DIAGNOSIS — Z0000 Encounter for general adult medical examination without abnormal findings: Secondary | ICD-10-CM

## 2015-11-17 DIAGNOSIS — I10 Essential (primary) hypertension: Secondary | ICD-10-CM

## 2015-11-17 DIAGNOSIS — E559 Vitamin D deficiency, unspecified: Secondary | ICD-10-CM

## 2015-11-17 DIAGNOSIS — G43109 Migraine with aura, not intractable, without status migrainosus: Secondary | ICD-10-CM | POA: Diagnosis not present

## 2015-11-17 MED ORDER — HYDROCODONE-ACETAMINOPHEN 5-325 MG PO TABS: 1.0000 | ORAL_TABLET | Freq: Three times a day (TID) | ORAL | 0 refills | Status: DC | PRN

## 2015-11-17 MED ORDER — HYDROCODONE-ACETAMINOPHEN 5-325 MG PO TABS
1.0000 | ORAL_TABLET | Freq: Three times a day (TID) | ORAL | 0 refills | Status: DC | PRN
Start: 2015-11-17 — End: 2015-11-17

## 2015-11-17 MED ORDER — HYDROCODONE-ACETAMINOPHEN 5-325 MG PO TABS: 1.0000 | ORAL_TABLET | Freq: Four times a day (QID) | ORAL | 0 refills | Status: DC | PRN

## 2015-11-17 MED ORDER — MELOXICAM 15 MG PO TABS: 15.0000 mg | ORAL_TABLET | Freq: Every day | ORAL | 5 refills | Status: DC

## 2015-11-17 MED ORDER — VITAMIN D3 1.25 MG (50000 UT) PO TABS: 1.0000 | ORAL_TABLET | ORAL | 0 refills | Status: DC

## 2015-11-17 NOTE — Progress Notes (Signed)
   Subjective:    Patient ID: Catherine Powell is a 50 y.o. old female.  HPI #Chronic pain: neck pain, right ankle pain, left knee pain. These are all chronic issues. Denies any changes in the nature of her pain. She reports ankle swelling this time. She reports a lot of sitting at work. Taking her Norco. Last dose 4 days ago.  Unfortunately she was not able to lose weight as she wanted to. In fact she has gained about 5 pounds over the last one month . She also works third shift.  Denies fever, urinary or fecal incontinence or saddle paresthesia.   PMH: reviewed SH: denies smoking and recreational drug use  Review of Systems Per HPI Objective:   Vitals:   11/17/15 0844  BP: 139/69  Pulse: 70  Temp: 97.7 F (36.5 C)  TempSrc: Oral  Weight: (!) 183.2 kg (403 lb 12.8 oz)  Height: 5\' 9"  (1.753 m)    GEN: appears obese, no apparent distress. CVS: RRR, normal s1 and s2, no murmurs, no edema RESP: no increased work of breathing, good air movement bilaterally, no crackles or wheeze GI: soft, non-tender,non-distended, +BS Skin: Dry scaly skin in the lower extremities, ?lipidema MSK: No tenderness to palpation over her spines or paraspinal muscles. No tenderness to palpation in her lower extremities including her uncles.  NEURO: alert and oriented appropriately, no gross defecits  PSYCH: appropriate mood and affect     Assessment & Plan:  Encounter for chronic pain management Chronic issue. No new changes. No red flags. History of cervical radiculopathy s/p ACDF C5-C7. 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 She morbidly obese which makes things even worse. She couldn't loss weight as she wished to. She actually gained about 4 lbs from her last visit about a month ago. No sign of HF.  Goal: pain control to enable her function. She works. Wants to loss weight.  Refilled Norco 5/325mg  q8h as needed pain, #90. Gave three prescriptions for three month supply UDS  today Pain contract signed today [place] narcotic data base reviewed and no red flags.   Essential hypertension Blood pressure at goal today. Continue meds.  Vitamin D deficiency On weekly vitamin D 50, 000. Will check level when she completes the course.   Healthcare maintenance Declined colonoscopy and FOBT until her next birth day.

## 2015-11-17 NOTE — Patient Instructions (Addendum)
It was great seeing you today! We have addressed the following issues today  1. Chronic pain: I have refilled your prescriptions today. This medicine may make it for sleep. Please be cautious when you drive. 2. Colon cancer screening: I strongly recommend you get the colonoscopy as soon as possible 3. Vitamin D deficiency: Continue taking your vitamin D.  4.   Preventive health: I recommend you come back and see me to discuss about your other health issues   If we did any lab work today, and the results require attention, either me or my nurse will get in touch with you. If everything is normal, you will get a letter in mail. If you don't hear from us in two weeks, please give us a call. Otherwise, I look forward to talking with you again at our next visit. If you have any questions or concerns before then, please call the clinic at [phone].  Please bring all your medications to every doctors visit   Sign up for My Chart to have easy access to your labs results, and communication with your Primary care physician.    Please check-out at the front desk before leaving the clinic.   Take Care,

## 2015-11-18 NOTE — Assessment & Plan Note (Signed)
Chronic issue. No new changes. No red flags. History of cervical radiculopathy s/p ACDF C5-C7. 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 She morbidly obese which makes things even worse. She couldn't loss weight as she wished to. She actually gained about 4 lbs from her last visit about a month ago. No sign of HF.  Goal: pain control to enable her function. She works. Wants to loss weight.  Refilled Norco 5/325mg  q8h as needed pain, #90. Gave three prescriptions for three month supply UDS today Pain contract signed today [place] narcotic data base reviewed and no red flags.

## 2015-11-18 NOTE — Assessment & Plan Note (Signed)
Blood pressure at goal today. Continue meds.

## 2015-11-18 NOTE — Assessment & Plan Note (Signed)
On weekly vitamin D 50, 000. Will check level when she completes the course.

## 2015-11-18 NOTE — Assessment & Plan Note (Signed)
Declined colonoscopy and FOBT until her next birth day.

## 2015-11-19 LAB — PAIN MGMT, PROFILE 4 CONF W/O MM, U
AMPHETAMINES: NEGATIVE ng/mL (ref <500)
BENZODIAZEPINES: NEGATIVE ng/mL (ref <100)
Barbiturates: NEGATIVE ng/mL (ref <300)
COCAINE METABOLITE: NEGATIVE ng/mL (ref <150)
CREATININE: 161.1 mg/dL (ref >=20.0)
Methadone Metabolite: NEGATIVE ng/mL (ref <100)
OXIDANT: NEGATIVE ug/mL (ref <200)
Opiates: NEGATIVE ng/mL (ref <100)
Oxycodone: NEGATIVE ng/mL (ref <100)
Phencyclidine: NEGATIVE ng/mL (ref <25)
pH: 6.26 (ref 4.5–9.0)

## 2015-11-20 ENCOUNTER — Encounter: Payer: Self-pay | Admitting: Student

## 2015-11-20 ENCOUNTER — Other Ambulatory Visit: Payer: Self-pay | Admitting: Student

## 2015-12-26 ENCOUNTER — Ambulatory Visit: Payer: Managed Care, Other (non HMO) | Admitting: Student

## 2015-12-27 ENCOUNTER — Encounter: Payer: Self-pay | Admitting: Family Medicine

## 2015-12-27 ENCOUNTER — Ambulatory Visit (INDEPENDENT_AMBULATORY_CARE_PROVIDER_SITE_OTHER): Payer: Managed Care, Other (non HMO) | Admitting: Family Medicine

## 2015-12-27 DIAGNOSIS — K047 Periapical abscess without sinus: Secondary | ICD-10-CM | POA: Diagnosis not present

## 2015-12-27 MED ORDER — PENICILLIN V POTASSIUM 500 MG PO TABS: ORAL_TABLET | ORAL | 0 refills | Status: DC

## 2015-12-27 MED ORDER — AMOXICILLIN-POT CLAVULANATE 875-125 MG PO TABS: 1.0000 | ORAL_TABLET | Freq: Two times a day (BID) | ORAL | 0 refills | Status: DC

## 2015-12-27 NOTE — Progress Notes (Signed)
    Subjective: CC: "infection of gum"  HPI: Patient is a 50 y.o. female with a past medical history of HTN, GERD presenting to clinic today for infection of the gums.  She noted a big knot on the R upper gum yesterday. She pooped it and something nasty tasting came out. She notes continued pain.  She has pain with eating. She feels there is some localized swelling. Mild subjective fevers and chills 2 days ago but none now. She denies drooling or trismus.  No feeling that her jaw is locking up on her.  She is working on getting into a Education officer, communitydentist but wanted to get the infection treated ASAP. She has a h/o rheumatic disease and notes she's required to get PenVK before any dental procedure.   Social History: non smoker  Health Maintenance: UTD on vaccines  ROS: All other systems reviewed and are negative besides that noted in HPI.   Past Medical History Patient Active Problem List   Diagnosis Date Noted  . Dental abscess 12/27/2015  . Chronic venous insufficiency 06/27/2015  . Rash and nonspecific skin eruption 01/23/2015  . Healthcare maintenance 12/22/2014  . Vitamin D deficiency 05/11/2014  . Encounter for chronic pain management 05/11/2014  . Degenerative joint disease of knees 05/01/2010  . Cervical radiculopathy 08/06/2007  . Migraine 11/19/2006  . Essential hypertension 05/22/2006  . HYPERCHOLESTEROLEMIA 05/08/2006  . OBESITY, NOS 05/08/2006  . Allergic rhinitis 05/08/2006  . Asthma, chronic 05/08/2006  . GASTROESOPHAGEAL REFLUX, NO ESOPHAGITIS 05/08/2006    Medications- reviewed   Objective: Office vital signs reviewed. BP (!) 147/67   Pulse 72   Temp 98.4 F (36.9 C) (Oral)   Ht 5\' 9"  (1.753 m)   Wt (!) 403 lb 6.4 oz (183 kg)   LMP 05/15/2012   BMI 59.57 kg/m    Physical Examination:  General: Awake, alert, well- nourished, NAD, obese.  ENMT:  TMs intact, normal light reflex, no erythema, no bulging. Nasal turbinates moist. No trismus.  MMM, Oropharynx clear  without erythema or tonsillar exudate/hypertrophy. Very poor dentition with dental decay. Small 2-283mm region of erythema at the base of the upper R gum border.    Assessment/Plan: Dental abscess This is a 50 year old well-appearing female presenting with concerns for dental abscess. On exam today, there is simply an area of erythema noted. No active abscess formation noted however he does look as though they were an area previously. No evidence of systemic symptoms. No trismus or drooling noted.  - Augmentin 875mg  BID - pt give a Rx of PenVK incase she does not get in to see a dentist prior to her Augmentin running out - Advised to f/u in our clinic on Monday if she cannot get in to see a dentist before then. - Strict return precautions dicussed.     No orders of the defined types were placed in this encounter.   Meds ordered this encounter  Medications  . amoxicillin-clavulanate (AUGMENTIN) 875-125 MG tablet    Sig: Take 1 tablet by mouth 2 (two) times daily.    Dispense:  20 tablet    Refill:  0  . penicillin v potassium (VEETID) 500 MG tablet    Sig: Take 500mg  prior to dental procedure then after procedure    Dispense:  2 tablet    Refill:  0    Joanna Puffrystal S. Ayauna Mcnay PGY-3, [facility]

## 2015-12-27 NOTE — Patient Instructions (Signed)
It was nice to meet you.  For your gum infection, I have prescribed you Augmentin twice daily. Try to get in with a dentist within the next week. If you cannot get in with them, follow up with us on Monday. Please seek care if you note drooling, fevers, inability to eat or drink, worsening pain, or inability to open your mouth.   Dental Abscess A dental abscess is a collection of pus in or around a tooth. CAUSES This condition is caused by a bacterial infection around the root of the tooth that involves the inner part of the tooth (pulp). It may result from:  Severe tooth decay.  Trauma to the tooth that allows bacteria to enter into the pulp, such as a broken or chipped tooth.  Severe gum disease around a tooth. SYMPTOMS Symptoms of this condition include:  Severe pain in and around the infected tooth.  Swelling and redness around the infected tooth, in the mouth, or in the face.  Tenderness.  Pus drainage.  Bad breath.  Bitter taste in the mouth.  Difficulty swallowing.  Difficulty opening the mouth.  Nausea.  Vomiting.  Chills.  Swollen neck glands.  Fever. DIAGNOSIS This condition is diagnosed with examination of the infected tooth. During the exam, your dentist may tap on the infected tooth. Your dentist will also ask about your medical and dental history and may order X-rays. TREATMENT This condition is treated by eliminating the infection. This may be done with:  Antibiotic medicine.  A root canal. This may be performed to save the tooth.  Pulling (extracting) the tooth. This may also involve draining the abscess. This is done if the tooth cannot be saved. HOME CARE INSTRUCTIONS  Take medicines only as directed by your dentist.  If you were prescribed antibiotic medicine, finish all of it even if you start to feel better.  Rinse your mouth (gargle) often with salt water to relieve pain or swelling.  Do not drive or operate heavy machinery while  taking pain medicine.  Do not apply heat to the outside of your mouth.  Keep all follow-up visits as directed by your dentist. This is important. SEEK MEDICAL CARE IF:  Your pain is worse and is not helped by medicine. SEEK IMMEDIATE MEDICAL CARE IF:  You have a fever or chills.  Your symptoms suddenly get worse.  You have a very bad headache.  You have problems breathing or swallowing.  You have trouble opening your mouth.  You have swelling in your neck or around your eye.   This information is not intended to replace advice given to you by your health care provider. Make sure you discuss any questions you have with your health care provider.   Document Released: 02/25/2005 Document Revised: 07/12/2014 Document Reviewed: 02/22/2014 Elsevier Interactive Patient Education [employer]2016 [employer]

## 2015-12-27 NOTE — Assessment & Plan Note (Signed)
This is a 50 year old well-appearing female presenting with concerns for dental abscess. On exam today, there is simply an area of erythema noted. No active abscess formation noted however he does look as though they were an area previously. No evidence of systemic symptoms. No trismus or drooling noted.  - Augmentin  BID - pt give a Rx of PenVK incase she does not get in to see a dentist prior to her Augmentin running out - Advised to f/u in our clinic on Monday if she cannot get in to see a dentist before then. - Strict return precautions dicussed.

## 2016-01-20 ENCOUNTER — Telehealth: Payer: Self-pay | Admitting: Internal Medicine

## 2016-01-20 ENCOUNTER — Encounter (HOSPITAL_COMMUNITY): Payer: Self-pay | Admitting: Emergency Medicine

## 2016-01-20 ENCOUNTER — Ambulatory Visit (HOSPITAL_COMMUNITY)
Admission: EM | Admit: 2016-01-20 | Discharge: 2016-01-20 | Disposition: A | Discharge status: Home / Self Care | Payer: Managed Care, Other (non HMO) | Attending: Family Medicine | Admitting: Family Medicine

## 2016-01-20 DIAGNOSIS — I10 Essential (primary) hypertension: Secondary | ICD-10-CM

## 2016-01-20 DIAGNOSIS — K047 Periapical abscess without sinus: Secondary | ICD-10-CM

## 2016-01-20 MED ORDER — AMOXICILLIN-POT CLAVULANATE 875-125 MG PO TABS: 1.0000 | ORAL_TABLET | Freq: Two times a day (BID) | ORAL | 0 refills | Status: AC

## 2016-01-20 MED ORDER — CHLORHEXIDINE GLUCONATE 0.12% ORAL RINSE (MEDLINE KIT): 15.0000 mL | Freq: Two times a day (BID) | OROMUCOSAL | 0 refills | Status: AC

## 2016-01-20 NOTE — ED Provider Notes (Addendum)
[facility]    CSN: [id number] Arrival date & time: 01/20/16  1431     History   Chief Complaint Chief Complaint  Patient presents with  . Dental Pain    HPI Catherine Powell is a 50 y.o. female.   The history is provided by the patient. No language interpreter was used.  Dental Pain  Location:  Upper and lower Upper teeth location: Right upper and lower jaw with knot at the bottom coming up. Quality:  Aching, sharp and radiating (right ear and throat pain from her teeth) Severity:  Severe Onset quality:  Gradual Duration:  1 day (Started yesterday evening) Timing:  Constant Progression:  Worsening Chronicity:  Recurrent Context: dental caries   Context comment:  She recently completed Augmentin 2 weeks ago Previous work-up:  Filled cavity and root canal Relieved by:  Nothing Worsened by:  Cold food/drink and jaw movement Associated symptoms: facial pain, facial swelling and gum swelling   Associated symptoms: no fever and no oral lesions   Associated symptoms comment:  She had fever yesterday Risk factors: no alcohol problem, no diabetes and no smoking    HTN: She is on medication. She can not remember if she took her medication today. She stated she is more concern about her tooth ache. Past Medical History:  Diagnosis Date  . ASTHMA, PERSISTENT, MILD 05/08/2006   Qualifier: Diagnosis of  By: Lanier PrudeBolden  MD, Cathrine Musteraineisha    . Carpal tunnel syndrome 05/08/2006  . Degenerative joint disease of knees 05/01/2010   Qualifier: Diagnosis of  By: Christella HartiganJacobs MD, Fae PippinBret    . Depression 12/16/2006   Qualifier: Diagnosis of  By: Irving BurtonLeininger MD, Clifton CustardAaron    . ESSENTIAL HYPERTENSION 05/22/2006   Qualifier: Diagnosis of  By: Irving BurtonLeininger MD, Clifton CustardAaron    . GASTROESOPHAGEAL REFLUX, NO ESOPHAGITIS 05/08/2006   Qualifier: Diagnosis of  By: Irving BurtonLeininger MD, Clifton CustardAaron    . HYPERCHOLESTEROLEMIA 05/08/2006   Qualifier: Diagnosis of  By: Irving BurtonLeininger MD, Clifton CustardAaron    . MIGRAINE NOS W/INTRACTABLE MIGRAINE  11/19/2006   [facility] cannot [facility] to [facility] back.  Has been tried on multiple medications to minimal effect.  Hydrocodone appears to be the only medication that can abort headaches.    . Rheumatic fever   . SINUSITIS, CHRONIC, NOS 05/08/2006    Patient Active Problem List   Diagnosis Date Noted  . Dental abscess 12/27/2015  . Chronic venous insufficiency 06/27/2015  . Rash and nonspecific skin eruption 01/23/2015  . Healthcare maintenance 12/22/2014  . Vitamin D deficiency 05/11/2014  . Encounter for chronic pain management 05/11/2014  . Degenerative joint disease of knees 05/01/2010  . Cervical radiculopathy 08/06/2007  . Migraine 11/19/2006  . Essential hypertension 05/22/2006  . HYPERCHOLESTEROLEMIA 05/08/2006  . OBESITY, NOS 05/08/2006  . Allergic rhinitis 05/08/2006  . Asthma, chronic 05/08/2006  . GASTROESOPHAGEAL REFLUX, NO ESOPHAGITIS 05/08/2006    History reviewed. No pertinent surgical history.  OB History    No data available       Home Medications    Prior to Admission medications   Medication Sig Start Date End Date Taking? Authorizing Provider  albuterol (VENTOLIN HFA) 108 (90 Base) MCG/ACT inhaler Inhale 2 puffs into the lungs every 4 (four) hours as needed for wheezing. 10/17/15  Yes Almon Herculesaye T Gonfa, MD  amoxicillin-clavulanate (AUGMENTIN) 875-125 MG tablet Take 1 tablet by mouth 2 (two) times daily. 12/27/15  Yes Joanna Puffrystal S Dorsey, MD  atorvastatin (LIPITOR) 40 MG tablet Take  1 tablet (40 mg total) by mouth daily. 10/17/15  Yes Almon Hercules, MD  budesonide-formoterol (SYMBICORT) 160-4.5 MCG/ACT inhaler Inhale 1 puff into the lungs 2 (two) times daily. 04/28/14  Yes Tyrone Nine, MD  Cholecalciferol (VITAMIN D3) 50000 units TABS Take 1 tablet by mouth once a week. For 8 weeks 11/17/15  Yes Almon Hercules, MD  co-enzyme Q-10 30 MG capsule Take 30 mg by mouth 2 (two) times daily.   Yes Historical Provider, MD  HYDROcodone-acetaminophen (NORCO) 5-325  MG tablet Take 1 tablet by mouth every 8 (eight) hours as needed for moderate pain or severe pain. 11/17/15  Yes Almon Hercules, MD  HYDROcodone-acetaminophen (NORCO) 5-325 MG tablet Take 1 tablet by mouth every 8 (eight) hours as needed for moderate pain. 11/17/15  Yes Almon Hercules, MD  Melatonin 10 MG TABS Take 10 mg by mouth daily. Take once daily at bedtime   Yes Historical Provider, MD  meloxicam (MOBIC) 15 MG tablet Take 1 tablet (15 mg total) by mouth daily. 11/17/15  Yes Almon Hercules, MD  quinapril-hydrochlorothiazide (ACCURETIC) 20-12.5 MG tablet Take 1 tablet by mouth daily. 10/17/15  Yes Almon Hercules, MD  esomeprazole (NEXIUM) 40 MG capsule Take 1 capsule (40 mg total) by mouth daily before breakfast. 04/28/14 05/18/15  Tyrone Nine, MD  furosemide (LASIX) 40 MG tablet Take 1 tablet (40 mg total) by mouth daily. 06/27/15   Tyrone Nine, MD  HYDROcodone-acetaminophen (NORCO) 5-325 MG tablet Take 1 tablet by mouth every 8 (eight) hours as needed for moderate pain. 11/17/15   Almon Hercules, MD  montelukast (SINGULAIR) 10 MG tablet Take 1 tablet (10 mg total) by mouth at bedtime. 08/18/14   Tyrone Nine, MD  nystatin-triamcinolone ointment [facility]) Apply 1 application topically 2 (two) times daily. 07/27/15   Tyrone Nine, MD  olopatadine (PATANOL) 0.1 % ophthalmic solution Place 1 drop into both eyes 2 (two) times daily. 08/18/14   Tyrone Nine, MD  penicillin v potassium (VEETID) 500 MG tablet Take 500mg  prior to dental procedure then after procedure 12/27/15   Joanna Puff, MD  promethazine (PHENERGAN) 25 MG tablet Take 1 tablet (25 mg total) by mouth every 8 (eight) hours as needed for nausea or vomiting. 12/21/14 05/18/15  Tyrone Nine, MD    Family History Family History  Problem Relation Age of Onset  . Migraines Mother     Social History Social History  Substance Use Topics  . Smoking status: Never Smoker  . Smokeless tobacco: Never Used  . Alcohol use Yes     Allergies   Tolectin ds  [tolmetin sodium]   Review of Systems Review of Systems  Constitutional: Negative for fever.  HENT: Positive for facial swelling. Negative for mouth sores.   Respiratory: Negative.   Cardiovascular: Negative.   All other systems reviewed and are negative.    Physical Exam Triage Vital Signs ED Triage Vitals  Enc Vitals Group     BP 01/20/16 1538 172/96     Pulse Rate 01/20/16 1538 80     Resp 01/20/16 1538 24     Temp 01/20/16 1538 97.7 F (36.5 C)     Temp Source 01/20/16 1538 Oral     SpO2 01/20/16 1538 100 %     Weight --      Height --      Head Circumference --      Peak Flow --  Pain Score 01/20/16 1551 10     Pain Loc --      Pain Edu? --      Excl. in GC? --    No data found.   Updated Vital Signs BP 172/96 (BP Location: Left Arm) Comment: Reported elevated BP to Dr Lum BabeEniola  Pulse 80   Temp 97.7 F (36.5 C) (Oral)   Resp 24   LMP 05/15/2012   SpO2 100%   Visual Acuity Right Eye Distance:   Left Eye Distance:   Bilateral Distance:    Right Eye Near:   Left Eye Near:    Bilateral Near:     Physical Exam  Constitutional: She appears well-developed and well-nourished. She appears distressed.  In mild distress due to dental pain  HENT:  Head:    Mouth/Throat: Uvula is midline, oropharynx is clear and moist and mucous membranes are normal.    Eyes: Conjunctivae and EOM are normal. Pupils are equal, round, and reactive to light.  Neck: Neck supple.  Skin: She is not diaphoretic.     UC Treatments / Results  Labs (all labs ordered are listed, but only abnormal results are displayed) Labs Reviewed - No data to display  EKG  EKG Interpretation None       Radiology No results found.  Procedures Procedures (including critical care time)  Medications Ordered in UC Medications - No data to display   Initial Impression / Assessment and Plan / UC Course  I have reviewed the triage vital signs and the nursing notes.  Pertinent  labs & imaging results that were available during my care of the patient were reviewed by me and considered in my medical decision making (see chart for details).  Clinical Course     Likely dental abscess She has had multiple dental works and infections in the past. Per patient she did well on Augmentin in the past. I reordered Augmentin. CHlorhexidine oral rinse also prescribed. She is advised to contact dental office as soon as possible for appointment. Return precaution discussed. She has Vicodin at home, she will use this for pain,.   BP elevated today, likely due to pain and medication non-adherence. Patient advised to take her meds as soon as she gets home. F/U with PCP soon for reassessment.t Final Clinical Impressions(s) / UC Diagnoses   Final diagnoses:  None   Dental abscess HTN  New Prescriptions New Prescriptions   No medications on file     Doreene ElandKehinde T Dewanna Hurston, MD 01/20/16 1619    Doreene ElandKehinde T Adit Riddles, MD 01/20/16 1622

## 2016-01-20 NOTE — Discharge Instructions (Signed)
Nice seeing you today. I am sorry about your tooth ache. You likely have dental infection. I have prescribed you some antibiotic. Use home pain meds as needed. Call dental office for appointment as soon as possible.

## 2016-01-20 NOTE — ED Triage Notes (Signed)
Pt here for right side dental pain onset yest and radiates to jaw associated w/swelling  Taking Vicodin w/no relief.   A&O x4... NAD

## 2016-01-20 NOTE — Telephone Encounter (Signed)
**  After Hours/ Emergency Line Call*  Received a call to report that Catherine Powell is having worsening dental abscess. Pt was seen about 1 month ago for a dental abscess. She was given Augmentin at that time, which helped. She has been trying to get in to see a dentist but has been placed on a waiting list since she doesn't have insurance. She called the dentist's office today, but they could not get her in. They will try to get her in this week. I advised that patient go to an urgent care or to the ED to be evaluated.  Will forward to PCP.  Hilton SinclairKaty D Mychaela Lennartz, MD PGY-2, [facility]

## 2016-02-15 ENCOUNTER — Ambulatory Visit (INDEPENDENT_AMBULATORY_CARE_PROVIDER_SITE_OTHER): Payer: Managed Care, Other (non HMO) | Admitting: Student

## 2016-02-15 ENCOUNTER — Encounter: Payer: Self-pay | Admitting: Student

## 2016-02-15 VITALS — BP 146/76 | HR 72 | Temp 98.2°F | Wt 397.0 lb

## 2016-02-15 DIAGNOSIS — M170 Bilateral primary osteoarthritis of knee: Secondary | ICD-10-CM

## 2016-02-15 DIAGNOSIS — E559 Vitamin D deficiency, unspecified: Secondary | ICD-10-CM | POA: Diagnosis not present

## 2016-02-15 DIAGNOSIS — G8929 Other chronic pain: Secondary | ICD-10-CM

## 2016-02-15 DIAGNOSIS — G43109 Migraine with aura, not intractable, without status migrainosus: Secondary | ICD-10-CM

## 2016-02-15 DIAGNOSIS — I10 Essential (primary) hypertension: Secondary | ICD-10-CM | POA: Diagnosis not present

## 2016-02-15 MED ORDER — HYDROCODONE-ACETAMINOPHEN 5-325 MG PO TABS: 1.0000 | ORAL_TABLET | Freq: Three times a day (TID) | ORAL | 0 refills | Status: DC | PRN

## 2016-02-15 NOTE — Progress Notes (Signed)
   Subjective:    Patient ID: Catherine Powell is a 50 y.o. old female. Patient is her for follow up on her back pain.  HPI #Chronic pain: neck pain, right ankle pain, left knee pain. These are all chronic issues. Denies any changes in the nature of her pain. Started a new delivery job. She says she is walking a lot now. She states losing 6 lbs. She is hoping to lose more with the new job. Her previous job was second shift and sedentary which made it hard to lose weight.  Denies fever, urinary or fecal incontinence or saddle paresthesia.   PMH: reviewed SH: denies smoking and recreational drug use  Review of Systems Per HPI Objective:   Vitals:   02/15/16 1614  BP: (!) 146/76  Pulse: 72  Temp: 98.2 F (36.8 C)  TempSrc: Oral  SpO2: 97%  Weight: (!) 397 lb (180.1 kg)    GEN: morbidly obese, no apparent distress. CVS: RRR, normal s1 and s2, no murmurs, no edema RESP: no increased work of breathing, GI: morbidly obese Skin: Dry scaly skin in the lower extremities MSK: No tenderness to palpation over her spines or paraspinal muscles. No tenderness to palpation in her entire LE bilaterally NEURO: alert and oriented appropriately, no gross defecits  PSYCH: appropriate mood and affect     Assessment & Plan:  Encounter for chronic pain management Stable. No acute change. No red flags. Lost some weight. Hoping to lose more. Discussed about portion size and exercise. Gave handouts. Refilled her Norco 5/325 mg #90. Take one tablet q8hr as needed for pain. Gave three refills.  Essential hypertension Stable. Couldn't wait for blood draw as she had to leave for work. Placed future order for BMP.  Vitamin D deficiency Future order for Vit D level

## 2016-02-15 NOTE — Patient Instructions (Addendum)
It was great seeing you today! We have addressed the following issues today  1. Neck, knee and ankle pain : I refilled his prescriptions today. I also recommend losing weights. Please refer to the dietary and exercise recommendation.  2.  Swelling in your leg: please back and see us if this continues to be a problem or if you have chest pain, trouble breathing or other symptoms concerning to you. Please make sure you go to your appointment for your echocardiogram as well.    If we did any lab work today, and the results require attention, either me or my nurse will get in touch with you. If everything is normal, you will get a letter in mail. If you don't hear from us in two weeks, please give us a call. Otherwise, we look forward to seeing you again at your next visit. If you have any questions or concerns before then, please call the clinic at [phone].   Please bring all your medications to every doctors visit   Sign up for My Chart to have easy access to your labs results, and communication with your Primary care physician.     Please check-out at the front desk before leaving the clinic.   Portion Size    Choose healthier foods such as 100% whole grains, vegetables, fruits, beans, nut seeds, olive oil, most vegetable oils, fat-free dietary, wild game and fish.   Avoid sweet tea, other sweetened beverages, soda, fruit juice, cold cereal and milk and trans fat.   Exercise at least 150 minutes per week, including weight resistance exercises 3 or 4 times per week.   Try to lose at least 7-10% of your current body weight.

## 2016-02-16 NOTE — Assessment & Plan Note (Signed)
Stable. No acute change. No red flags. Lost some weight. Hoping to lose more. Discussed about portion size and exercise. Gave handouts. Refilled her Norco 5/325 mg #90. Take one tablet q8hr as needed for pain. Gave three refills.

## 2016-02-16 NOTE — Assessment & Plan Note (Signed)
Future order for Vit D level

## 2016-02-16 NOTE — Assessment & Plan Note (Signed)
Stable. Couldn't wait for blood draw as she had to leave for work. Placed future order for BMP.

## 2016-03-18 ENCOUNTER — Other Ambulatory Visit: Payer: Self-pay | Admitting: *Deleted

## 2016-03-18 DIAGNOSIS — I872 Venous insufficiency (chronic) (peripheral): Secondary | ICD-10-CM

## 2016-03-18 DIAGNOSIS — R600 Localized edema: Secondary | ICD-10-CM

## 2016-03-18 MED ORDER — FUROSEMIDE 40 MG PO TABS: 40.0000 mg | ORAL_TABLET | Freq: Every day | ORAL | 0 refills | Status: DC

## 2016-03-20 ENCOUNTER — Encounter: Payer: Self-pay | Admitting: Student

## 2016-03-20 ENCOUNTER — Ambulatory Visit (INDEPENDENT_AMBULATORY_CARE_PROVIDER_SITE_OTHER): Payer: Managed Care, Other (non HMO) | Admitting: Student

## 2016-03-20 VITALS — BP 124/70 | HR 80 | Temp 98.3°F | Ht 68.5 in | Wt 396.2 lb

## 2016-03-20 DIAGNOSIS — J111 Influenza due to unidentified influenza virus with other respiratory manifestations: Secondary | ICD-10-CM

## 2016-03-20 DIAGNOSIS — R69 Illness, unspecified: Secondary | ICD-10-CM | POA: Diagnosis not present

## 2016-03-20 MED ORDER — OSELTAMIVIR PHOSPHATE 75 MG PO CAPS: 75.0000 mg | ORAL_CAPSULE | Freq: Two times a day (BID) | ORAL | 0 refills | Status: AC

## 2016-03-20 NOTE — Progress Notes (Signed)
Subjective:    Catherine Powell is a 51 y.o. old female here sinus pain and headache  HPI Sinus pain and headache: started two days ago. Headache "achy feeling" over the frontal head bilaterally. Denies photophobia. Reports runny nose, congestion, sore throat, cough (dry and myalgia. Had fever to 103 at home. Denies chest pain. Reports dyspnea after bad cough. Denies appetite change, nausea, vomiting, diarrhea or dysuria. She is tolerating oral intake well. She has been trying to hydrate herself with Gatorade. Tried delsium and corcidin HB over the counter which didn't help. She thinks she has flu. She had her flu vaccine this year. She is worried about her husband who has already picked it up from her.  PMH/Problem List: has HYPERCHOLESTEROLEMIA; OBESITY, NOS; Migraine; Essential hypertension; Allergic rhinitis; Asthma, chronic; GASTROESOPHAGEAL REFLUX, NO ESOPHAGITIS; Cervical radiculopathy; Degenerative joint disease of knees; Vitamin D deficiency; Encounter for chronic pain management; Healthcare maintenance; Rash and nonspecific skin eruption; Chronic venous insufficiency; Dental abscess; and Influenza-like illness on her problem list.   has a past medical history of ASTHMA, PERSISTENT, MILD (05/08/2006); Carpal tunnel syndrome (05/08/2006); Degenerative joint disease of knees (05/01/2010); Depression (12/16/2006); ESSENTIAL HYPERTENSION (05/22/2006); GASTROESOPHAGEAL REFLUX, NO ESOPHAGITIS (05/08/2006); HYPERCHOLESTEROLEMIA (05/08/2006); MIGRAINE NOS W/INTRACTABLE MIGRAINE (11/19/2006); Rheumatic fever; and SINUSITIS, CHRONIC, NOS (05/08/2006).  SH Social History  Substance Use Topics  . Smoking status: Never Smoker  . Smokeless tobacco: Never Used  . Alcohol use Yes    Review of Systems  Constitutional: Positive for fever. Negative for appetite change, diaphoresis and unexpected weight change.  HENT: Positive for rhinorrhea. Negative for sore throat and trouble swallowing.   Eyes: Negative for  photophobia and visual disturbance.  Respiratory: Negative for cough, chest tightness and shortness of breath.   Cardiovascular: Negative for chest pain and palpitations.  Gastrointestinal: Negative for abdominal pain, diarrhea, nausea and vomiting.  Endocrine: Negative for cold intolerance and heat intolerance.  Genitourinary: Negative for dysuria.  Musculoskeletal: Positive for myalgias.  Skin: Negative for rash.  Neurological: Negative for dizziness and light-headedness.  Hematological: Negative for adenopathy. Does not bruise/bleed easily.  Psychiatric/Behavioral: Negative for dysphoric mood.      Objective:     Vitals:   03/20/16 1602  BP: 124/70  Pulse: 80  Temp: 98.3 F (36.8 C)  TempSrc: Oral  SpO2: 96%  Weight: (!) 179.7 kg (396 lb 3.2 oz)  Height: 5' 8.5" (1.74 m)    Physical Exam GEN: appears unwell, obese Nares: positive for rhinorrhea, congestion or erythema Oropharynx: mmm without erythema or exudation HEM: negative for cervical or periauricular lymphadenopathies CVS: RRR, nl S1&S2, no murmurs, no edema,  cap refills < 2 secs RESP: speaks in full sentence, no IWOB, good air movement bilaterally, CTAB MSK: no focal tenderness or notable swelling SKIN: no apparent skin lesion NEURO: alert and oiented appropriately, no gross defecits  PSYCH: euthymic mood with congruent affect    Assessment and Plan:  Influenza-like illness History and exam suggestive for influenza although she had flu vaccine this year. Lung exam normal to suspect LRTI. Hemodynamically stable, satting at 96% on RA and ambulating fine.   -Tamiflu for 5 days. Still within 48 hours window - She takes Aspirin at home. Recommended taking 325 mg as needed pain. -Rest and adequate hydration. Gave her work note.  -Discussed return precautions including but not limited to shortness of breath, chest pain, severe persistent cough, persistent fever over 101F, not tolerating fluids by mouth or other  symptoms concerning to her.   Return if symptoms worsen  or fail to improve.  Almon Herculesaye T Gonfa, MD 03/21/16 Pager: [phone]

## 2016-03-20 NOTE — Patient Instructions (Signed)
It was great seeing you today! Your symptoms are likely due to influenza infection. This might take a few days to improve. The cough might take up to 5 weeks to resolve.  I have sent a prescription for Tamiflu to your pharmacy. Please pick up the prescription and start taking today. I also recommend drinking plenty of fluids such as water/Gatorade to keep yourself hydrated. You can also try tablespoonful of honey before bedtime to help with cough. You can also take Aspirin 325 mg as needed for fever or pain. Please come back and see us if you symptoms worsen, or if you have shortness of breath, chest pain, persistent fever over 101F or other symptoms concerning to you.     If we did any lab work today, and the results require attention, either me or my nurse will get in touch with you. If everything is normal, you will get a letter in mail. If you don't hear from us in two weeks, please give us a call. Otherwise, we look forward to seeing you again at your next visit. If you have any questions or concerns before then, please call the clinic at [phone].   Please bring all your medications to every doctors visit   Sign up for My Chart to have easy access to your labs results, and communication with your Primary care physician.     Please check-out at the front desk before leaving the clinic.     Take Care,

## 2016-03-21 DIAGNOSIS — R69 Illness, unspecified: Principal | ICD-10-CM

## 2016-03-21 NOTE — Assessment & Plan Note (Addendum)
History and exam suggestive for influenza although she had flu vaccine this year. Lung exam normal to suspect LRTI. Hemodynamically stable, satting at 96% on RA and ambulating fine.   -Tamiflu for 5 days. Still within 48 hours window - She takes Aspirin at home. Recommended taking 325 mg as needed pain. -Rest and adequate hydration. Gave her work note.  -Discussed return precautions including but not limited to shortness of breath, chest pain, severe persistent cough, persistent fever over 101F, not tolerating fluids by mouth or other symptoms concerning to her.

## 2016-03-22 ENCOUNTER — Telehealth: Payer: Self-pay | Admitting: Student

## 2016-03-22 NOTE — Telephone Encounter (Signed)
Called and talked to patient about the request for medical information I received from [facility] [employer] Patient aware of this and gave her verbal permission to fill the form and fax to them.  Reviewed, completed, and signed form.  Note routed to RN team inbasket and placed completed form in Clinic RN's office (wall pocket above desk).  She also reports feeling better although she is still coughing. She says the fever has resolved. I recommended continuing good hydration and a tablespoonful of honey before bedtime. Patient voiced understanding and agreed to do as advised.   Candelaria Stagersaye Delmon Andrada, MD.  PGY-2, [facility] Pager [phone] 03/22/16  8:57 AM

## 2016-03-26 ENCOUNTER — Encounter: Payer: Self-pay | Admitting: Student

## 2016-03-26 ENCOUNTER — Telehealth: Payer: Self-pay | Admitting: Student

## 2016-03-26 NOTE — Telephone Encounter (Signed)
Called and talked to patient about her chest congestion. She also reports dry cough. Denies shortness of breath, chest pain or fever. I recommended trying over-the-counter Mucinex in addition to a tablespoonful of honey before bedtime. I also recommended adequate hydration. I advised her to come to the clinic or ED if she have shortness of breath, chest pain, fever or worsening of her chest congestion or cough. Patient voiced understanding and appreciative to call.

## 2016-03-26 NOTE — Telephone Encounter (Signed)
Will forward to PCP for advising. Tempestt S Roberts, CMA  

## 2016-03-26 NOTE — Telephone Encounter (Signed)
Pt still has a lot of chest congestion. She is wanting to know what she could take. Please advise

## 2016-04-03 ENCOUNTER — Other Ambulatory Visit: Payer: Self-pay | Admitting: Student

## 2016-04-03 DIAGNOSIS — J45909 Unspecified asthma, uncomplicated: Secondary | ICD-10-CM

## 2016-04-03 DIAGNOSIS — J45901 Unspecified asthma with (acute) exacerbation: Secondary | ICD-10-CM

## 2016-04-03 MED ORDER — PREDNISONE 50 MG PO TABS: 50.0000 mg | ORAL_TABLET | Freq: Every day | ORAL | 0 refills | Status: DC

## 2016-04-03 NOTE — Progress Notes (Signed)
Talked to patient. Still with congestion and cough. Sent prescription for Prednisone 50 mg, one tablet with breakfast for 5 days

## 2016-04-14 ENCOUNTER — Other Ambulatory Visit: Payer: Self-pay | Admitting: Family Medicine

## 2016-04-14 ENCOUNTER — Telehealth: Payer: Self-pay | Admitting: Obstetrics and Gynecology

## 2016-04-14 NOTE — Telephone Encounter (Signed)
Family Medicine Emergency Line Telephone Note   Patient calling requesting refill of her permethrin cream for dustmite allergy. Discussed with patient that this is not proper use for the emergency line and that the refill can be done tomorrow at regular business hours by PCP.   Of note, do not see this medication on patient's medication list. I did inform her however that it can be bought over the counter.   Caryl AdaJazma Phelps, DO 04/14/2016, 5:20 PM PGY-3, [facility]

## 2016-04-16 ENCOUNTER — Encounter: Payer: Self-pay | Admitting: Student

## 2016-04-18 ENCOUNTER — Other Ambulatory Visit: Payer: Self-pay | Admitting: Student

## 2016-04-18 DIAGNOSIS — B86 Scabies: Secondary | ICD-10-CM

## 2016-04-18 MED ORDER — PERMETHRIN 5 % EX CREA: TOPICAL_CREAM | CUTANEOUS | 0 refills | Status: DC

## 2016-05-14 ENCOUNTER — Ambulatory Visit (INDEPENDENT_AMBULATORY_CARE_PROVIDER_SITE_OTHER): Payer: Managed Care, Other (non HMO) | Admitting: Student

## 2016-05-14 ENCOUNTER — Encounter: Payer: Self-pay | Admitting: Student

## 2016-05-14 VITALS — BP 110/70 | HR 72 | Temp 98.3°F | Ht 68.5 in | Wt >= 6400 oz

## 2016-05-14 DIAGNOSIS — B850 Pediculosis due to Pediculus humanus capitis: Secondary | ICD-10-CM | POA: Diagnosis not present

## 2016-05-14 DIAGNOSIS — G43109 Migraine with aura, not intractable, without status migrainosus: Secondary | ICD-10-CM | POA: Diagnosis not present

## 2016-05-14 DIAGNOSIS — R21 Rash and other nonspecific skin eruption: Secondary | ICD-10-CM | POA: Diagnosis not present

## 2016-05-14 DIAGNOSIS — G8929 Other chronic pain: Secondary | ICD-10-CM | POA: Diagnosis not present

## 2016-05-14 DIAGNOSIS — M170 Bilateral primary osteoarthritis of knee: Secondary | ICD-10-CM | POA: Diagnosis not present

## 2016-05-14 DIAGNOSIS — I10 Essential (primary) hypertension: Secondary | ICD-10-CM | POA: Diagnosis not present

## 2016-05-14 LAB — BASIC METABOLIC PANEL WITH GFR
BUN: 11 mg/dL (ref 7–25)
CO2: 27 mmol/L (ref 20–31)
CREATININE: 0.8 mg/dL (ref 0.50–1.05)
Calcium: 8.9 mg/dL (ref 8.6–10.4)
Chloride: 104 mmol/L (ref 98–110)
GFR, Est Non African American: 85 mL/min (ref >=60)
Glucose, Bld: 93 mg/dL (ref 65–99)
POTASSIUM: 3.7 mmol/L (ref 3.5–5.3)
Sodium: 139 mmol/L (ref 135–146)

## 2016-05-14 MED ORDER — HYDROCODONE-ACETAMINOPHEN 5-325 MG PO TABS: 1.0000 | ORAL_TABLET | Freq: Three times a day (TID) | ORAL | 0 refills | Status: DC | PRN

## 2016-05-14 MED ORDER — PERMETHRIN 5 % EX CREA: TOPICAL_CREAM | CUTANEOUS | 0 refills | Status: DC

## 2016-05-14 NOTE — Progress Notes (Signed)
Subjective:    Catherine Powell is a 51 y.o. old female here for rash  HPI Rash: for two months. Rash on head and back of her neck. Rash is pruritic. She says rash started after she was given prednisone for cough in the setting of viral URI. She had history of head lice. Tried permethrin cream without improvement.   Neck pain: this chronic issue. Denies any changes in the nature of her pain. Pain is achy over the back of her neck. No radiation to her shoulder or arms. Denies numbness, weakness or tingling in her extremities. Denies waking with pain at night, fever, bowel or bladder issue, saddle anesthesia or fall. Taking her Norco. She says it makes pain bearable although it doesn't completely makes go away.  Denies fever, urinary or fecal incontinence or saddle paresthesia.  Body mass index is 60.38 kg/m.  PMH/Problem List: has HYPERCHOLESTEROLEMIA; OBESITY, NOS; Migraine; Essential hypertension; Allergic rhinitis; Asthma, chronic; GASTROESOPHAGEAL REFLUX, NO ESOPHAGITIS; Cervical radiculopathy; Degenerative joint disease of knees; Vitamin D deficiency; Encounter for chronic pain management; Healthcare maintenance; Rash and nonspecific skin eruption; Chronic venous insufficiency; Dental abscess; and Influenza-like illness on her problem list.   has a past medical history of ASTHMA, PERSISTENT, MILD (05/08/2006); Carpal tunnel syndrome (05/08/2006); Degenerative joint disease of knees (05/01/2010); Depression (12/16/2006); ESSENTIAL HYPERTENSION (05/22/2006); GASTROESOPHAGEAL REFLUX, NO ESOPHAGITIS (05/08/2006); HYPERCHOLESTEROLEMIA (05/08/2006); MIGRAINE NOS W/INTRACTABLE MIGRAINE (11/19/2006); Rheumatic fever; and SINUSITIS, CHRONIC, NOS (05/08/2006).  FH:  Family History  Problem Relation Age of Onset  . Migraines Mother     [place] Social History  Substance Use Topics  . Smoking status: Never Smoker  . Smokeless tobacco: Never Used  . Alcohol use Yes    Review of Systems Review of systems negative  except for pertinent positives and negatives in history of present illness above.     Objective:     Vitals:   05/14/16 1519  BP: 110/70  Pulse: 72  Temp: 98.3 F (36.8 C)  TempSrc: Oral  SpO2: 98%  Weight: (!) 403 lb (182.8 kg)  Height: 5' 8.5" (1.74 m)    Physical Exam GEN: appears morbidly obese, no apparent distress. CVS: RRR, normal s1 and s2, no murmurs, no edema RESP: no increased work of breathing GI: very obese Skin: spots of erythematous macules over the back of her neck, numerous nits in her hair, didn't see lice. See picture for more.      MSK: Exam is limited due to her morbid obesity. She has no tenderness to palpation over her cervical spines or paraspinal muscles, full range of motion in her neck with some pain when she turned left.    NEURO: alert and oriented appropriately, no gross defecits PSYCH: appropriate mood and affect     Assessment and Plan:  Rash and nonspecific skin eruption Classic for lice rash. She has a lot of nits in her hair overlying the rash area. Doubt scabies or bacterial infection.   Permethrin cream  Recommended washing her clothes and bed linen with warm and good detergent  Recommended using nit kit to remove the nits  Encounter for chronic pain management Stable. No acute change. No red flags medically. No red flags from [facility] data base. No radiculopathy.   Refilled her Norco 5/325 mg #90. Take one tablet q8hr as needed for pain. Gave three refills.   Discussed about proper storage and disposal   Essential hypertension Normotensive.  Continue meds  BMP and TSH today     Orders Placed This Encounter  Procedures  . BASIC METABOLIC PANEL WITH GFR  . TSH    Return if symptoms worsen or fail to improve.  Mercy Riding, MD 05/14/16 Pager: [phone]

## 2016-05-14 NOTE — Assessment & Plan Note (Signed)
Normotensive.  Continue meds  BMP and TSH today

## 2016-05-14 NOTE — Patient Instructions (Signed)
It was great seeing you today! We have addressed the following issues today 1. Skin rash: I think this is likely due to head lice. I sent a prescription for permethrin to your pharmacy. I also like you to use the nit kit to get the nits out.   2.  Neck pain: I have refilled your pain medications  If we did any lab work today, and the results require attention, either me or my nurse will get in touch with you. If everything is normal, you will get a letter in mail and a message via . If you don't hear from [place] in two weeks, please give [place] a call. Otherwise, we look forward to seeing you again at your next visit. If you have any questions or concerns before then, please call the clinic at [phone].  Please bring all your medications to every doctors visit  Sign up for My Chart to have easy access to your labs results, and communication with your Primary care physician.    Please check-out at the front desk before leaving the clinic.    Take Care,   Dr. Cyndia Skeeters

## 2016-05-14 NOTE — Assessment & Plan Note (Addendum)
Classic for lice rash. She has a lot of nits in her hair overlying the rash area. Doubt scabies or bacterial infection.   Permethrin cream  Recommended washing her clothes and bed linen with warm and good detergent  Recommended using nit kit to remove the nits

## 2016-05-14 NOTE — Assessment & Plan Note (Signed)
Stable. No acute change. No red flags medically. [facility]. No radiculopathy.   Refilled her Norco 5/325 mg #90. Take one tablet q8hr as needed for pain. Gave three refills.   Discussed about proper storage and disposal

## 2016-05-15 ENCOUNTER — Encounter: Payer: Self-pay | Admitting: Student

## 2016-05-15 LAB — TSH: TSH: 2.21 m[IU]/L

## 2016-05-16 ENCOUNTER — Ambulatory Visit: Payer: Managed Care, Other (non HMO) | Admitting: Obstetrics and Gynecology

## 2016-05-17 ENCOUNTER — Ambulatory Visit (INDEPENDENT_AMBULATORY_CARE_PROVIDER_SITE_OTHER): Payer: Managed Care, Other (non HMO) | Admitting: Obstetrics and Gynecology

## 2016-05-17 VITALS — BP 126/88 | HR 72 | Temp 97.6°F

## 2016-05-17 DIAGNOSIS — M25561 Pain in right knee: Secondary | ICD-10-CM | POA: Diagnosis not present

## 2016-05-17 NOTE — Progress Notes (Signed)
   Subjective:   Patient ID: Catherine Powell, female    DOB: 02/17/1966, 51 y.o.   MRN: [id number]  Patient presents for Same Day Appointment  Chief Complaint  Patient presents with  . Knee Pain    HPI: # Knee pain Patient presents today saying that she had acute knee pain that started 3 days ago. Patient states that she was rolling in her bed Tuesday night and heard a pop in her right knee. She states that she had pain and swelling immediately thereafter. Swelling has since gone down. She was unable to bear weight following morning.  She also endorses that she is unable to straighten her leg due to pain. Knee has associated catching.   patient has had prior meniscal clean out on the right knee otherwise no previous knee surgeries.  Of note, patient mentions she fell on that same knee around Dec. 16th. She went to an urgent care who imaged her knee and states it was normal.   Review of Systems   See HPI for ROS.   History  Smoking Status  . Never Smoker  Smokeless Tobacco  . Never Used    Past medical history, surgical, family, and social history reviewed and updated in the EMR as appropriate.  Pertinent Historical Findings include: Morbid obesity Objective:  BP 126/88   Pulse 72   Temp 97.6 F (36.4 C) (Oral)   LMP 05/15/2012  Vitals and nursing note reviewed  Physical Exam  Constitutional:  Morbid obesity, in wheelchair  Cardiovascular: Normal rate.   Pulmonary/Chest: Effort normal.   Right Knee: Normal to inspection with no erythema or effusion or obvious bony abnormalities. Unable to fully visualize landmarks due to size. Can't appreciate swelling.  Palpation with no warmth, patellar tenderness, or condyle tenderness. Lateral joint line tenderness. Tenderness to back of leg ROM limited in extension Ligaments with endpoints neurovascularly intact  Assessment & Plan:  1. Acute pain of right knee Concern for meniscal tear. No red flags on exam.  Neurovascularly intact. Patient unable to bear weight. Due to patient's size and the evaluation was limited. Will see if can get patient in for urgent ortho evaluation. If unable to get patient in today will order imaging of knee. - Ambulatory referral to Orthopedics  Diagnosis and plan along were discussed in detail with this patient today. The patient verbalized understanding and agreed with the plan. Patient advised if symptoms worsen return to clinic or ER.   PATIENT EDUCATION PROVIDED: See AVS   Caryl AdaJazma Hazely Sealey, DO 05/17/2016, 11:08 AM PGY-3, [facility]

## 2016-05-23 ENCOUNTER — Other Ambulatory Visit: Payer: Self-pay | Admitting: Family Medicine

## 2016-05-23 DIAGNOSIS — M25561 Pain in right knee: Secondary | ICD-10-CM

## 2016-05-25 ENCOUNTER — Ambulatory Visit
Admission: RE | Admit: 2016-05-25 | Discharge: 2016-05-25 | Disposition: A | Discharge status: Home / Self Care | Payer: Managed Care, Other (non HMO) | Source: Ambulatory Visit | Attending: Family Medicine | Admitting: Family Medicine

## 2016-05-25 DIAGNOSIS — M25561 Pain in right knee: Secondary | ICD-10-CM

## 2016-06-04 ENCOUNTER — Ambulatory Visit
Admission: RE | Admit: 2016-06-04 | Discharge: 2016-06-04 | Disposition: A | Discharge status: Home / Self Care | Payer: Managed Care, Other (non HMO) | Source: Ambulatory Visit | Attending: Family Medicine | Admitting: Family Medicine

## 2016-06-06 ENCOUNTER — Other Ambulatory Visit: Payer: Self-pay | Admitting: Orthopedic Surgery

## 2016-06-13 ENCOUNTER — Encounter (HOSPITAL_COMMUNITY): Payer: Self-pay | Admitting: *Deleted

## 2016-06-13 NOTE — Progress Notes (Signed)
Pt denies SOB, chest pain, and being under the care of a cardiologist. Pt stated that she last saw Dr. Antoine Poche, Cardiology, 3 years ago. Pt stated that Dr. Antoine Poche performed a stress test, echo and cardiac cath all in 2009 due to a history of a heart murmur. Pt made aware to stop taking Aspirin, vitamins, Co Q 10, Melatonin, fish oil, and herbal medications. Do not take any NSAIDs ie: Ibuprofen, Advil, Naproxen, BC and Goody powder or any medication containing Aspirin such as Mobic. Pt stated that she has chronic migraines and Excedrin Migraine is the only thing that alleviates the pain. Pt verbalized understanding of all pre-op instructions. Pt chart forwarded to anesthesia for review.

## 2016-06-14 MED ORDER — CEFAZOLIN SODIUM 10 G IJ SOLR
3.0000 g | INTRAMUSCULAR | Status: AC
  Administered 2016-06-17: 3 g via INTRAVENOUS
  Filled 2016-06-14: qty 3000

## 2016-06-14 MED ORDER — DEXTROSE-NACL 5-0.45 % IV SOLN: INTRAVENOUS | Status: DC

## 2016-06-14 NOTE — Progress Notes (Signed)
Anesthesia chart review: SAME DAY WORK-UP.  Patient is a 51 year old female scheduled for right knee arthroscopy on 06/17/2016 by Dr. Turner Daniels. Diagnosis: Right knee medial meniscal tear with loose body.  History includes super morbid obesity, never smoker, asthma, depression, hypertension, GERD, hypercholesterolemia, migraines, rheumatic fever (age 27), murmur (per patient; no murmur documented 02/15/16 PCP office visit and in 2008 cardiology visits), C5-7 ACDF '09, cholecystectomy, appendectomy chronic sinusitis, spinal headache.  PCP is Dr. Candelaria Stagers with [facility]. She is not currently followed by a cardiologist, but she was evaluated by Dr. Antoine Poche in 2008 for evaluation of chest pain and by notes had a non-ischemic stress test. (There was no mention of an echo.) She later had a cardiac cath by Dr. Eden Emms in 2009 that showed no evidence of epicardial CAD.  Meds include albuterol, Lipitor, Symbicort, Nexium, Lasix, Norco, melatonin, Singulair, quinapril-HCTZ.  Cardiac cath 5/01/109: IMPRESSION:  1. No evidence of epicardial coronary artery disease.      a.     diffusely small distal left anterior descending artery,       probably normal.  She is a same day workup, so she will get labs and EKG on the day of surgery. Anesthesiologist will evaluate and determine definitive anesthesia plan.  Velna Ochs [facility] Stay [facility]/Anesthesiology Phone [phone] 06/14/2016 11:36 AM

## 2016-06-15 ENCOUNTER — Other Ambulatory Visit: Payer: Managed Care, Other (non HMO)

## 2016-06-15 DIAGNOSIS — S83241A Other tear of medial meniscus, current injury, right knee, initial encounter: Secondary | ICD-10-CM | POA: Diagnosis present

## 2016-06-15 NOTE — H&P (Signed)
Catherine Powell is an 51 y.o. female.    Chief Complaint: right knee pain and popping  HPI: Catherine Powell is seen in consultation from Dr. Althea Charon for right knee medial meniscal tears and loose bodies along with chondromalacia.  Reviewing her MRI scan and plain x-ray, she does have moderate to severe arthritis of the right knee.  In addition, her BMI 61.3.  When she received her cortisone injection from Dr. Althea Charon the knee was numb for about an hour and then the pain returned.  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  Past Medical History:  Diagnosis Date  . Allergic rhinitis   . ASTHMA, PERSISTENT, MILD 05/08/2006   Qualifier: Diagnosis of  By: Lanier Prude  MD, Cathrine Muster    . Carpal tunnel syndrome 05/08/2006  . Degenerative joint disease of knees 05/01/2010   Qualifier: Diagnosis of  By: Christella Hartigan MD, [name]    . Depression 12/16/2006   Qualifier: Diagnosis of  By: Irving Burton MD, [name]    . ESSENTIAL HYPERTENSION 05/22/2006   Qualifier: Diagnosis of  By: Irving Burton MD, [name]    . GASTROESOPHAGEAL REFLUX, NO ESOPHAGITIS 05/08/2006   Qualifier: Diagnosis of  By: Irving Burton MD, [name]    . Heart murmur    slight due to Rheumatic fever had cardiac studies 2009 Dr. Antoine Poche  . HYPERCHOLESTEROLEMIA 05/08/2006   Qualifier: Diagnosis of  By: Irving Burton MD, [name]    . MIGRAINE NOS W/INTRACTABLE MIGRAINE 11/19/2006   Has been seen by [facility] but currently cannot afford to go back.  Has been tried on multiple medications to minimal effect.  Hydrocodone appears to be the only medication that can abort headaches.    . Rheumatic fever   . Right knee meniscal tear    and loose body  . SINUSITIS, CHRONIC, NOS 05/08/2006  . Spinal headache     Past Surgical History:  Procedure Laterality Date  . APPENDECTOMY    . CARDIAC CATHETERIZATION     2009 Dr. Antoine Poche  . CERVICAL FUSION    .  CHOLECYSTECTOMY    . WISDOM TOOTH EXTRACTION      Family History  Problem Relation Age of Onset  . Migraines Mother    Social History:  reports that she has never smoked. She has never used smokeless tobacco. She reports that she drinks alcohol. She reports that she does not use drugs.  Allergies:  Allergies  Allergen Reactions  . Tolectin Ds [Tolmetin Sodium] Hives and Shortness Of Breath    No prescriptions prior to admission.    No results found for this or any previous visit (from the past 48 hour(s)). No results found.  Review of Systems  Constitutional: Negative.   HENT: Negative.   Eyes: Negative.   Respiratory: Negative.   Cardiovascular: Positive for leg swelling.  Gastrointestinal: Negative.   Genitourinary: Negative.   Musculoskeletal: Positive for joint pain.  Skin: Negative.   Neurological: Negative.   Endo/Heme/Allergies: Negative.   Psychiatric/Behavioral: Negative.     Last menstrual period 05/15/2012. Physical Exam  Constitutional: She is oriented to person, place, and time. She appears well-developed and well-nourished.  HENT:  Head: Normocephalic and atraumatic.  Eyes: Pupils are equal, round, and reactive to light.  Neck: Normal range of motion. Neck supple.  Cardiovascular: Intact distal pulses.   Respiratory: Effort normal.  Musculoskeletal: She exhibits tenderness.  The MRI images are  reviewed with the patient.  She has a moderate effusion, a torn and extruded posterior horn medial meniscus and medial horn medial meniscus tears.  I agree with the radiologist, there is probably some loose bodies in the notch.  To my reading there is also moderate to severe arthritis medial compartment with a small amount of subchondral edema in the medial tibial plateau.  Neurological: She is alert and oriented to person, place, and time.  Skin: Skin is warm and dry.  Psychiatric: She has a normal mood and affect. Her behavior is normal. Judgment and thought  content normal.     Assessment/Plan  Assess: 51 year old female, BMI of 61, moderate to severe arthritis the medial compartment of the right knee with acute on chronic medial meniscal tear and loose bodies in the right knee.  Plan: A fair portion of the patient's pain is mechanical with catching and the sudden inability to fully straighten her leg.  Because of this, arthroscopic evaluation treatment is probably reasonable.  It should be noted that arthroscopy will not help her underlying arthritis.  She will probably need a knee replacement at some point and before that can occur, she'll need to get her BMI down to 40, which would involve a weight loss of over 100 pounds.  We will get her set up for the arthroscopic intervention.  She'll be out of work for least another month and the patient understands that her surgery carries 2-4 times the amount of risk for things like infection, blood clots and wound problems, secondary to body habitus.  [name], PA-C 06/15/2016, 11:50 AM

## 2016-06-16 NOTE — Anesthesia Preprocedure Evaluation (Addendum)
Anesthesia Evaluation  Patient identified by MRN, date of birth, ID band Patient awake    Reviewed: Allergy & Precautions, H&P , Patient's Chart, lab work & pertinent test results, reviewed documented beta blocker date and time   Airway Mallampati: II  TM Distance: >3 FB Neck ROM: full    Dental no notable dental hx.    Pulmonary asthma ,    Pulmonary exam normal breath sounds clear to auscultation       Cardiovascular hypertension,  Rhythm:regular Rate:Normal     Neuro/Psych    GI/Hepatic GERD  ,  Endo/Other  Morbid obesity  Renal/GU      Musculoskeletal   Abdominal   Peds  Hematology   Anesthesia Other Findings   Reproductive/Obstetrics                             Anesthesia Physical Anesthesia Plan  ASA: III  Anesthesia Plan: General   Post-op Pain Management:    Induction: Intravenous  Airway Management Planned: LMA and Video Laryngoscope Planned  Additional Equipment:   Intra-op Plan:   Post-operative Plan:   Informed Consent: I have reviewed the patients History and Physical, chart, labs and discussed the procedure including the risks, benefits and alternatives for the proposed anesthesia with the patient or authorized representative who has indicated his/her understanding and acceptance.   Dental Advisory Given  Plan Discussed with: CRNA and Surgeon  Anesthesia Plan Comments: ( )       Anesthesia Quick Evaluation

## 2016-06-17 ENCOUNTER — Ambulatory Visit (HOSPITAL_COMMUNITY): Payer: Managed Care, Other (non HMO) | Admitting: Vascular Surgery

## 2016-06-17 ENCOUNTER — Encounter (HOSPITAL_COMMUNITY): Payer: Self-pay | Admitting: Certified Registered Nurse Anesthetist

## 2016-06-17 ENCOUNTER — Encounter (HOSPITAL_COMMUNITY)
Admission: RE | Disposition: A | Discharge status: Home / Self Care | Payer: Self-pay | Source: Ambulatory Visit | Attending: Orthopedic Surgery

## 2016-06-17 ENCOUNTER — Ambulatory Visit (HOSPITAL_COMMUNITY)
Admission: RE | Admit: 2016-06-17 | Discharge: 2016-06-18 | Disposition: A | Discharge status: Home / Self Care | Payer: Managed Care, Other (non HMO) | Source: Ambulatory Visit | Attending: Orthopedic Surgery | Admitting: Orthopedic Surgery

## 2016-06-17 DIAGNOSIS — J329 Chronic sinusitis, unspecified: Secondary | ICD-10-CM | POA: Insufficient documentation

## 2016-06-17 DIAGNOSIS — G8929 Other chronic pain: Secondary | ICD-10-CM

## 2016-06-17 DIAGNOSIS — Z981 Arthrodesis status: Secondary | ICD-10-CM | POA: Diagnosis not present

## 2016-06-17 DIAGNOSIS — M659 Synovitis and tenosynovitis, unspecified: Secondary | ICD-10-CM | POA: Insufficient documentation

## 2016-06-17 DIAGNOSIS — X58XXXA Exposure to other specified factors, initial encounter: Secondary | ICD-10-CM | POA: Diagnosis not present

## 2016-06-17 DIAGNOSIS — M23231 Derangement of other medial meniscus due to old tear or injury, right knee: Secondary | ICD-10-CM | POA: Diagnosis not present

## 2016-06-17 DIAGNOSIS — Z6841 Body Mass Index (BMI) 40.0 and over, adult: Secondary | ICD-10-CM | POA: Diagnosis not present

## 2016-06-17 DIAGNOSIS — S83241A Other tear of medial meniscus, current injury, right knee, initial encounter: Secondary | ICD-10-CM | POA: Diagnosis present

## 2016-06-17 DIAGNOSIS — R011 Cardiac murmur, unspecified: Secondary | ICD-10-CM | POA: Insufficient documentation

## 2016-06-17 DIAGNOSIS — S82111A Displaced fracture of right tibial spine, initial encounter for closed fracture: Secondary | ICD-10-CM | POA: Diagnosis not present

## 2016-06-17 DIAGNOSIS — M2341 Loose body in knee, right knee: Secondary | ICD-10-CM | POA: Insufficient documentation

## 2016-06-17 DIAGNOSIS — G43909 Migraine, unspecified, not intractable, without status migrainosus: Secondary | ICD-10-CM | POA: Diagnosis not present

## 2016-06-17 DIAGNOSIS — M170 Bilateral primary osteoarthritis of knee: Secondary | ICD-10-CM | POA: Insufficient documentation

## 2016-06-17 DIAGNOSIS — Z888 Allergy status to other drugs, medicaments and biological substances status: Secondary | ICD-10-CM | POA: Insufficient documentation

## 2016-06-17 DIAGNOSIS — Z9049 Acquired absence of other specified parts of digestive tract: Secondary | ICD-10-CM | POA: Insufficient documentation

## 2016-06-17 DIAGNOSIS — J4530 Mild persistent asthma, uncomplicated: Secondary | ICD-10-CM | POA: Diagnosis not present

## 2016-06-17 DIAGNOSIS — K219 Gastro-esophageal reflux disease without esophagitis: Secondary | ICD-10-CM | POA: Diagnosis not present

## 2016-06-17 DIAGNOSIS — I10 Essential (primary) hypertension: Secondary | ICD-10-CM | POA: Diagnosis not present

## 2016-06-17 DIAGNOSIS — M23221 Derangement of posterior horn of medial meniscus due to old tear or injury, right knee: Secondary | ICD-10-CM | POA: Diagnosis not present

## 2016-06-17 DIAGNOSIS — M94261 Chondromalacia, right knee: Secondary | ICD-10-CM | POA: Insufficient documentation

## 2016-06-17 DIAGNOSIS — Y939 Activity, unspecified: Secondary | ICD-10-CM | POA: Insufficient documentation

## 2016-06-17 DIAGNOSIS — E7800 Pure hypercholesterolemia, unspecified: Secondary | ICD-10-CM | POA: Insufficient documentation

## 2016-06-17 DIAGNOSIS — J309 Allergic rhinitis, unspecified: Secondary | ICD-10-CM | POA: Diagnosis not present

## 2016-06-17 HISTORY — DX: Cardiac murmur, unspecified: R01.1

## 2016-06-17 HISTORY — PX: KNEE ARTHROSCOPY: SHX127

## 2016-06-17 HISTORY — DX: Other reaction to spinal and lumbar puncture: G97.1

## 2016-06-17 HISTORY — DX: Unspecified tear of unspecified meniscus, current injury, right knee, initial encounter: S83.206A

## 2016-06-17 HISTORY — DX: Allergic rhinitis, unspecified: J30.9

## 2016-06-17 LAB — CBC
HEMATOCRIT: 42.7 % (ref 36.0–46.0)
HEMOGLOBIN: 13.8 g/dL (ref 12.0–15.0)
MCH: 27.8 pg (ref 26.0–34.0)
MCHC: 32.3 g/dL (ref 30.0–36.0)
MCV: 86.1 fL (ref 78.0–100.0)
PLATELETS: 294 10*3/uL (ref 150–400)
RBC: 4.96 MIL/uL (ref 3.87–5.11)
RDW: 13.8 % (ref 11.5–15.5)
WBC: 7.3 10*3/uL (ref 4.0–10.5)

## 2016-06-17 LAB — BASIC METABOLIC PANEL WITH GFR
Anion gap: 12 (ref 5–15)
BUN: 8 mg/dL (ref 6–20)
CO2: 26 mmol/L (ref 22–32)
Calcium: 8.9 mg/dL (ref 8.9–10.3)
Chloride: 99 mmol/L — ABNORMAL LOW (ref 101–111)
Creatinine, Ser: 0.81 mg/dL (ref 0.44–1.00)
GFR calc Af Amer: >60 mL/min
GFR calc non Af Amer: >60 mL/min
Glucose, Bld: 109 mg/dL — ABNORMAL HIGH (ref 65–99)
Potassium: 3.8 mmol/L (ref 3.5–5.1)
Sodium: 137 mmol/L (ref 135–145)

## 2016-06-17 SURGERY — ARTHROSCOPY, KNEE
Anesthesia: General | Site: Knee | Laterality: Right

## 2016-06-17 MED ORDER — PROPOFOL 10 MG/ML IV BOLUS
INTRAVENOUS | Status: AC
  Filled 2016-06-17: qty 40

## 2016-06-17 MED ORDER — HYDROCODONE-ACETAMINOPHEN 5-325 MG PO TABS
1.0000 | ORAL_TABLET | Freq: Three times a day (TID) | ORAL | Status: DC | PRN
Start: 2016-06-17 — End: 2016-06-17

## 2016-06-17 MED ORDER — HYDROCHLOROTHIAZIDE 12.5 MG PO CAPS
12.5000 mg | ORAL_CAPSULE | Freq: Every day | ORAL | Status: DC
  Administered 2016-06-17 – 2016-06-18 (×2): 12.5 mg via ORAL
  Filled 2016-06-17 (×2): qty 1

## 2016-06-17 MED ORDER — PROMETHAZINE HCL 25 MG PO TABS: 25.0000 mg | ORAL_TABLET | Freq: Three times a day (TID) | ORAL | Status: DC | PRN

## 2016-06-17 MED ORDER — ONDANSETRON HCL 4 MG/2ML IJ SOLN
INTRAMUSCULAR | Status: DC | PRN
  Administered 2016-06-17: 4 mg via INTRAVENOUS

## 2016-06-17 MED ORDER — PROPOFOL 10 MG/ML IV BOLUS
INTRAVENOUS | Status: DC | PRN
Start: 2016-06-17 — End: 2016-06-17
  Administered 2016-06-17: 200 mg via INTRAVENOUS

## 2016-06-17 MED ORDER — LACTATED RINGERS IV SOLN
INTRAVENOUS | Status: DC
  Administered 2016-06-17: 08:00:00 via INTRAVENOUS

## 2016-06-17 MED ORDER — MIDAZOLAM HCL 2 MG/2ML IJ SOLN
INTRAMUSCULAR | Status: AC
  Filled 2016-06-17: qty 2

## 2016-06-17 MED ORDER — OXYCODONE-ACETAMINOPHEN 5-325 MG PO TABS
1.0000 | ORAL_TABLET | ORAL | Status: DC | PRN
  Administered 2016-06-17 – 2016-06-18 (×3): 1 via ORAL
  Filled 2016-06-17 (×3): qty 1

## 2016-06-17 MED ORDER — BUPIVACAINE HCL (PF) 0.5 % IJ SOLN
INTRAMUSCULAR | Status: AC
  Filled 2016-06-17: qty 30

## 2016-06-17 MED ORDER — QUINAPRIL-HYDROCHLOROTHIAZIDE 20-12.5 MG PO TABS: 1.0000 | ORAL_TABLET | Freq: Every day | ORAL | Status: DC

## 2016-06-17 MED ORDER — FENTANYL CITRATE (PF) 100 MCG/2ML IJ SOLN
INTRAMUSCULAR | Status: DC | PRN
  Administered 2016-06-17 (×5): 50 ug via INTRAVENOUS

## 2016-06-17 MED ORDER — HYDROCODONE-ACETAMINOPHEN 5-325 MG PO TABS
1.0000 | ORAL_TABLET | ORAL | Status: DC | PRN
  Administered 2016-06-17 – 2016-06-18 (×4): 2 via ORAL
  Filled 2016-06-17 (×3): qty 2

## 2016-06-17 MED ORDER — LIDOCAINE 2% (20 MG/ML) 5 ML SYRINGE
INTRAMUSCULAR | Status: DC | PRN
  Administered 2016-06-17: 40 mg via INTRAVENOUS

## 2016-06-17 MED ORDER — ALBUTEROL SULFATE (2.5 MG/3ML) 0.083% IN NEBU: 2.5000 mg | INHALATION_SOLUTION | RESPIRATORY_TRACT | Status: DC | PRN

## 2016-06-17 MED ORDER — LACTATED RINGERS IV SOLN
INTRAVENOUS | Status: DC | PRN
  Administered 2016-06-17: 08:00:00 via INTRAVENOUS

## 2016-06-17 MED ORDER — EPINEPHRINE PF 1 MG/ML IJ SOLN
INTRAMUSCULAR | Status: DC | PRN
  Administered 2016-06-17 (×2): 1 mg

## 2016-06-17 MED ORDER — EPINEPHRINE PF 1 MG/ML IJ SOLN
INTRAMUSCULAR | Status: AC
  Filled 2016-06-17: qty 1

## 2016-06-17 MED ORDER — FUROSEMIDE 40 MG PO TABS: 40.0000 mg | ORAL_TABLET | Freq: Every day | ORAL | Status: DC | PRN

## 2016-06-17 MED ORDER — ONDANSETRON HCL 4 MG/2ML IJ SOLN: 4.0000 mg | Freq: Four times a day (QID) | INTRAMUSCULAR | Status: DC | PRN

## 2016-06-17 MED ORDER — SODIUM CHLORIDE 0.9 % IR SOLN
Status: DC | PRN
  Administered 2016-06-17 (×2): 3000 mL

## 2016-06-17 MED ORDER — METOCLOPRAMIDE HCL 5 MG/ML IJ SOLN: 5.0000 mg | Freq: Three times a day (TID) | INTRAMUSCULAR | Status: DC | PRN

## 2016-06-17 MED ORDER — ONDANSETRON HCL 4 MG PO TABS: 4.0000 mg | ORAL_TABLET | Freq: Four times a day (QID) | ORAL | Status: DC | PRN

## 2016-06-17 MED ORDER — CHLORHEXIDINE GLUCONATE 4 % EX LIQD: 60.0000 mL | Freq: Once | CUTANEOUS | Status: DC

## 2016-06-17 MED ORDER — HYDROCODONE-ACETAMINOPHEN 5-325 MG PO TABS
ORAL_TABLET | ORAL | Status: AC
  Filled 2016-06-17: qty 2

## 2016-06-17 MED ORDER — HYDROCODONE-ACETAMINOPHEN 5-325 MG PO TABS: 1.0000 | ORAL_TABLET | ORAL | 0 refills | Status: DC | PRN

## 2016-06-17 MED ORDER — OLOPATADINE HCL 0.1 % OP SOLN
1.0000 [drp] | Freq: Two times a day (BID) | OPHTHALMIC | Status: DC | PRN
Start: 2016-06-17 — End: 2016-06-18

## 2016-06-17 MED ORDER — SUGAMMADEX SODIUM 500 MG/5ML IV SOLN
INTRAVENOUS | Status: DC | PRN
Start: 2016-06-17 — End: 2016-06-17
  Administered 2016-06-17: 500 mg via INTRAVENOUS

## 2016-06-17 MED ORDER — MONTELUKAST SODIUM 10 MG PO TABS: 10.0000 mg | ORAL_TABLET | Freq: Every day | ORAL | Status: DC

## 2016-06-17 MED ORDER — ROCURONIUM BROMIDE 10 MG/ML (PF) SYRINGE
PREFILLED_SYRINGE | INTRAVENOUS | Status: DC | PRN
  Administered 2016-06-17: 50 mg via INTRAVENOUS

## 2016-06-17 MED ORDER — MOMETASONE FURO-FORMOTEROL FUM 200-5 MCG/ACT IN AERO: 2.0000 | INHALATION_SPRAY | Freq: Two times a day (BID) | RESPIRATORY_TRACT | Status: DC

## 2016-06-17 MED ORDER — FENTANYL CITRATE (PF) 100 MCG/2ML IJ SOLN
INTRAMUSCULAR | Status: AC
  Administered 2016-06-17: 50 ug via INTRAVENOUS
  Filled 2016-06-17: qty 2

## 2016-06-17 MED ORDER — FENTANYL CITRATE (PF) 100 MCG/2ML IJ SOLN
25.0000 ug | INTRAMUSCULAR | Status: DC | PRN
  Administered 2016-06-17 (×2): 50 ug via INTRAVENOUS

## 2016-06-17 MED ORDER — BUPIVACAINE HCL (PF) 0.5 % IJ SOLN
INTRAMUSCULAR | Status: DC | PRN
  Administered 2016-06-17: 30 mL

## 2016-06-17 MED ORDER — FENTANYL CITRATE (PF) 250 MCG/5ML IJ SOLN
INTRAMUSCULAR | Status: AC
  Filled 2016-06-17: qty 5

## 2016-06-17 MED ORDER — MIDAZOLAM HCL 5 MG/5ML IJ SOLN
INTRAMUSCULAR | Status: DC | PRN
  Administered 2016-06-17 (×2): 1 mg via INTRAVENOUS

## 2016-06-17 MED ORDER — PANTOPRAZOLE SODIUM 40 MG PO TBEC
80.0000 mg | DELAYED_RELEASE_TABLET | Freq: Every day | ORAL | Status: DC
  Administered 2016-06-17 – 2016-06-18 (×2): 80 mg via ORAL
  Filled 2016-06-17 (×2): qty 2

## 2016-06-17 MED ORDER — MELOXICAM 7.5 MG PO TABS
15.0000 mg | ORAL_TABLET | Freq: Every day | ORAL | Status: DC
  Administered 2016-06-17 – 2016-06-18 (×2): 15 mg via ORAL
  Filled 2016-06-17 (×2): qty 1

## 2016-06-17 MED ORDER — METOCLOPRAMIDE HCL 5 MG PO TABS: 5.0000 mg | ORAL_TABLET | Freq: Three times a day (TID) | ORAL | Status: DC | PRN

## 2016-06-17 MED ORDER — LISINOPRIL 20 MG PO TABS
20.0000 mg | ORAL_TABLET | Freq: Every day | ORAL | Status: DC
  Administered 2016-06-17 – 2016-06-18 (×2): 20 mg via ORAL
  Filled 2016-06-17 (×2): qty 1

## 2016-06-17 SURGICAL SUPPLY — 40 items
BANDAGE ACE 6X5 VEL STRL LF (GAUZE/BANDAGES/DRESSINGS) ×3 IMPLANT
BLADE CUDA 5.5 (BLADE) IMPLANT
BLADE CUTTER GATOR 3.5 (BLADE) ×3 IMPLANT
BLADE GREAT WHITE 4.2 (BLADE) ×1 IMPLANT
BLADE GREAT WHITE 4.2MM (BLADE) ×1
BUR OVAL 6.0 (BURR) IMPLANT
DRAPE ARTHROSCOPY W/POUCH 114 (DRAPES) ×3 IMPLANT
DRSG PAD ABDOMINAL 8X10 ST (GAUZE/BANDAGES/DRESSINGS) ×2 IMPLANT
DURAPREP 26ML APPLICATOR (WOUND CARE) ×3 IMPLANT
GAUZE SPONGE 4X4 12PLY STRL (GAUZE/BANDAGES/DRESSINGS) ×3 IMPLANT
GAUZE XEROFORM 1X8 LF (GAUZE/BANDAGES/DRESSINGS) ×3 IMPLANT
GLOVE BIO SURGEON STRL SZ7.5 (GLOVE) ×3 IMPLANT
GLOVE BIO SURGEON STRL SZ8.5 (GLOVE) ×6 IMPLANT
GLOVE BIOGEL PI IND STRL 8 (GLOVE) ×2 IMPLANT
GLOVE BIOGEL PI IND STRL 9 (GLOVE) ×1 IMPLANT
GLOVE BIOGEL PI INDICATOR 8 (GLOVE) ×4
GLOVE BIOGEL PI INDICATOR 9 (GLOVE) ×2
GOWN STRL REUS W/ TWL LRG LVL3 (GOWN DISPOSABLE) ×3 IMPLANT
GOWN STRL REUS W/ TWL XL LVL3 (GOWN DISPOSABLE) ×2 IMPLANT
GOWN STRL REUS W/TWL LRG LVL3 (GOWN DISPOSABLE) ×3
GOWN STRL REUS W/TWL XL LVL3 (GOWN DISPOSABLE) ×6
KIT BASIN OR (CUSTOM PROCEDURE TRAY) ×3 IMPLANT
KIT ROOM TURNOVER OR (KITS) ×3 IMPLANT
MANIFOLD NEPTUNE II (INSTRUMENTS) ×3 IMPLANT
NDL 18GX1X1/2 (RX/OR ONLY) (NEEDLE) IMPLANT
NDL FILTER BLUNT 18X1 1/2 (NEEDLE) IMPLANT
NDL HYPO 18GX1.5 BLUNT FILL (NEEDLE) IMPLANT
NEEDLE 18GX1X1/2 (RX/OR ONLY) (NEEDLE) ×3 IMPLANT
NEEDLE FILTER BLUNT 18X 1/2SAF (NEEDLE) ×4
NEEDLE FILTER BLUNT 18X1 1/2 (NEEDLE) ×2 IMPLANT
NEEDLE HYPO 18GX1.5 BLUNT FILL (NEEDLE) ×3 IMPLANT
PACK ARTHROSCOPY DSU (CUSTOM PROCEDURE TRAY) ×3 IMPLANT
PAD ARMBOARD 7.5X6 YLW CONV (MISCELLANEOUS) ×6 IMPLANT
PROBE BIPOLAR ATHRO 135MM 90D (MISCELLANEOUS) ×2 IMPLANT
SET ARTHROSCOPY TUBING (MISCELLANEOUS) ×3
SET ARTHROSCOPY TUBING LN (MISCELLANEOUS) ×1 IMPLANT
SPONGE LAP 4X18 X RAY DECT (DISPOSABLE) ×3 IMPLANT
SYR TB 1ML LUER SLIP (SYRINGE) ×2 IMPLANT
TOWEL OR 17X24 6PK STRL BLUE (TOWEL DISPOSABLE) ×6 IMPLANT
WATER STERILE IRR 1000ML POUR (IV SOLUTION) ×3 IMPLANT

## 2016-06-17 NOTE — Discharge Instructions (Signed)
Knee Arthroscopy, Care After °Refer to this sheet in the next few weeks. These instructions provide you with information about caring for yourself after your procedure. Your health care provider may also give you more specific instructions. Your treatment has been planned according to current medical practices, but problems sometimes occur. Call your health care provider if you have any problems or questions after your procedure. °What can I expect after the procedure? °After the procedure, it is common to have: °· Soreness. °· Pain. ° °Follow these instructions at home: °Bathing °· Do not take baths, swim, or use a hot tub until your health care provider approves. °Incision care °· There are many different ways to close and cover an incision, including stitches, skin glue, and adhesive strips. Follow your health care provider’s instructions about: °? Incision care. °? Bandage (dressing) changes and removal. °? Incision closure removal. °· Check your incision area every day for signs of infection. Watch for: °? Redness, swelling, or pain. °? Fluid, blood, or pus. °Activity °· Avoid strenuous activities for as long as directed by your health care provider. °· Return to your normal activities as directed by your health care provider. Ask your health care provider what activities are safe for you. °· Perform range-of-motion exercises only as directed by your health care provider. °· Do not lift anything that is heavier than 10 lb (4.5 kg). °· Do not drive or operate heavy machinery while taking pain medicine. °· If you were given crutches, use them as directed by your health care provider. °Managing pain, stiffness, and swelling °· If directed, apply ice to the injured area: °? Put ice in a plastic bag. °? Place a towel between your skin and the bag. °? Leave the ice on for 20 minutes, 2-3 times per day. °· Raise the injured area above the level of your heart while you are sitting or lying down as directed by your  health care provider. °General instructions °· Keep all follow-up visits as directed by your health care provider. This is important. °· Take medicines only as directed by your health care provider. °· Do not use any tobacco products, including cigarettes, chewing tobacco, or electronic cigarettes. If you need help quitting, ask your health care provider. °· If you were given compression stockings, wear them as directed by your health care provider. These stockings help prevent blood clots and reduce swelling in your legs. °Contact a health care provider if: °· You have severe pain with any movement of your knee. °· You notice a bad smell coming from the incision or dressing. °· You have redness, swelling, or pain at the site of your incision. °· You have fluid, blood, or pus coming from your incision. °Get help right away if: °· You develop a rash. °· You have a fever. °· You have difficulty breathing or have shortness of breath. °· You develop pain in your calves or in the back of your knee. °· You develop chest pain. °· You develop numbness or tingling in your leg or foot. °This information is not intended to replace advice given to you by your health care provider. Make sure you discuss any questions you have with your health care provider. °Document Released: 09/14/2004 Document Revised: 07/28/2015 Document Reviewed: 02/21/2014 °Elsevier Interactive Patient Education © 2017 [employer] ° °

## 2016-06-17 NOTE — Transfer of Care (Signed)
Immediate Anesthesia Transfer of Care Note  Patient: Catherine Powell  Procedure(s) Performed: Procedure(s): RIGHT KNEE ARTHROSCOPY (Right)  Patient Location: PACU  Anesthesia Type:General  Level of Consciousness: awake, alert , oriented and patient cooperative  Airway & Oxygen Therapy: Patient Spontanous Breathing and Patient connected to face mask oxygen  Post-op Assessment: Report given to RN and Post -op Vital signs reviewed and stable  Post vital signs: Reviewed and stable  Last Vitals:  Vitals:   06/17/16 0724  BP: 129/87  Pulse: 79  Resp: 20  Temp: 37.2 C    Last Pain:  Vitals:   06/17/16 0724  TempSrc: Oral         Complications: No apparent anesthesia complications

## 2016-06-17 NOTE — Interval H&P Note (Signed)
History and Physical Interval Note:  06/17/2016 7:15 AM  Catherine Powell  has presented today for surgery, with the diagnosis of RIGHT KNEE MEDIAL MENISCAL TEAR WITH LOOSE BODY  The various methods of treatment have been discussed with the patient and family. After consideration of risks, benefits and other options for treatment, the patient has consented to  Procedure(s): ARTHROSCOPY KNEE (Right) as a surgical intervention .  The patient's history has been reviewed, patient examined, no change in status, stable for surgery.  I have reviewed the patient's chart and labs.  Questions were answered to the patient's satisfaction.     Nestor Lewandowsky

## 2016-06-17 NOTE — Anesthesia Procedure Notes (Signed)
Procedure Name: Intubation Date/Time: 06/17/2016 9:39 AM Performed by: Annabelle Harman A Pre-anesthesia Checklist: Patient identified, Emergency Drugs available, Suction available and Patient being monitored Patient Re-evaluated:Patient Re-evaluated prior to inductionOxygen Delivery Method: Circle system utilized Preoxygenation: Pre-oxygenation with 100% oxygen Intubation Type: IV induction and Rapid sequence Laryngoscope Size: Glidescope and 4 Grade View: Grade I Tube type: Oral Tube size: 7.5 mm Number of attempts: 1 Airway Equipment and Method: Video-laryngoscopy and Rigid stylet Placement Confirmation: ETT inserted through vocal cords under direct vision,  positive ETCO2 and breath sounds checked- equal and bilateral Secured at: 23 cm Tube secured with: Tape Dental Injury: Teeth and Oropharynx as per pre-operative assessment  Difficulty Due To: Difficulty was anticipated, Difficult Airway- due to large tongue, Difficult Airway- due to reduced neck mobility and Difficult Airway- due to limited oral opening Future Recommendations: Recommend- induction with short-acting agent, and alternative techniques readily available

## 2016-06-17 NOTE — Progress Notes (Signed)
   06/17/16 0733  OBSTRUCTIVE SLEEP APNEA  Have you ever been diagnosed with sleep apnea through a sleep study? No  Do you snore loudly (loud enough to be heard through closed doors)?  0  Do you often feel tired, fatigued, or sleepy during the daytime (such as falling asleep during driving or talking to someone)? 0  Has anyone observed you stop breathing during your sleep? 0  Do you have, or are you being treated for high blood pressure? 1  BMI more than 35 kg/m2? 1  Age > 50 (1-yes) 1  Neck circumference greater than:Female 16 inches or larger, Female 17inches or larger? 1  Female Gender (Yes=1) 0  Obstructive Sleep Apnea Score 4  Score 5 or greater  Results sent to PCP

## 2016-06-17 NOTE — Op Note (Addendum)
Pre-Op Dx: Locked right knee with meniscal tears  Postop Dx: Same, lateral tibial spine fracture   Procedure: Right knee arthroscopic partial medial and lateral meniscectomies removal of loose bodies one of them being the lateral tibial spine as well as some osteochondral loose bodies were seen on the MRI scan.  Surgeon: Feliberto Gottron. Turner Daniels M.D.  Assist: Tomi Likens. Gaylene Brooks  (present throughout entire procedure and necessary for timely completion of the procedure) Anes: General LMA  EBL: Minimal  Fluids: 800 cc   Indications: Patient sustained an injury and has had catching popping and pain and cannot straighten her right knee completely since the injury. Her BMI is 61 and physical exam is quite difficult MRI scan showed meniscal tears and loose bodies. Patient desires elective arthroscopic evaluation and treatment of knee. Risks and benefits of surgery have been discussed and questions answered.  Procedure: Patient identified by arm band and taken to the operating room at the day surgery Center. The appropriate anesthetic monitors were attached, and General LMA anesthesia was induced without difficulty. Lateral post was applied to the table and the lower extremity was prepped and draped in usual sterile fashion from the ankle to the midthigh. Time out procedure was performed. We began the operation by making standard inferior lateral and inferior medial peripatellar portals with a #11 blade allowing introduction of the arthroscope through the inferior lateral portal and the out flow to the inferior medial portal. Pump pressure was set at 100 mmHg and diagnostic arthroscopy  revealed radial tear of the medial meniscus that was debrided back to stable margin with a 3.5 mm Gator sucker shaver. Moving into the notch we encountered a couple of 10mm osteochondral loose bodies that were removed with pituitary Rodgers and a larger loose body that came out of the bony bed of the tibia where the lateral tibial  spine had been located and was presumed to be the lateral tibial spine oddly the anterior cruciate ligament was actually intact. There is degenerative tearing of the lateral meniscus likewise debrided. Collateral ligaments were stable the gutter was cleared medially and laterally and there were no further loose bodies. 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 We did use the ArthroCare wand throughout the procedure to obtain hemostasis.. The knee was irrigated out normal saline solution. A dressing of xerofoam 4 x 4 dressing sponges, web roll and an Ace wrap was applied. The patient was awakened extubated and taken to the recovery without difficulty.    Signed: Nestor Lewandowsky, MD

## 2016-06-18 ENCOUNTER — Encounter (HOSPITAL_COMMUNITY): Payer: Self-pay | Admitting: Orthopedic Surgery

## 2016-06-18 DIAGNOSIS — M23221 Derangement of posterior horn of medial meniscus due to old tear or injury, right knee: Secondary | ICD-10-CM | POA: Diagnosis not present

## 2016-06-18 NOTE — Anesthesia Postprocedure Evaluation (Signed)
Anesthesia Post Note  Patient: Meriam Sprague A Hooker-Schrom  Procedure(s) Performed: Procedure(s) (LRB): RIGHT KNEE ARTHROSCOPY (Right)  Patient location during evaluation: PACU Anesthesia Type: General Level of consciousness: awake and alert Pain management: pain level controlled Vital Signs Assessment: post-procedure vital signs reviewed and stable Respiratory status: spontaneous breathing, nonlabored ventilation, respiratory function stable and patient connected to nasal cannula oxygen Cardiovascular status: blood pressure returned to baseline and stable Postop Assessment: no signs of nausea or vomiting Anesthetic complications: no       Last Vitals:  Vitals:   06/18/16 0504 06/18/16 0757  BP: 128/70 125/74  Pulse: 67 70  Resp: 18 18  Temp: 37 C 36.9 C    Last Pain:  Vitals:   06/18/16 1115  TempSrc:   PainSc: 3                  Daimian Sudberry EDWARD

## 2016-06-18 NOTE — Progress Notes (Signed)
Pt doing well. Pt given D/C instructions with Rx, verbal understanding was provided. Pt's incision is covered with dressing, which was changed prior to D/C. Pt received 3-n-1 from [facility]. Pt D/C'd home via wheelchair per MD order. Pt is stable @ D/C and has no other needs at this time. Rema Fendt, RN

## 2016-06-18 NOTE — Discharge Summary (Signed)
Patient ID: Catherine Powell MRN: [id number] DOB/AGE: 1965/04/16 51 y.o.  Admit date: 06/17/2016 Discharge date: 06/18/2016  Admission Diagnoses:  Principal Problem:   Acute medial meniscus tear of right knee   Discharge Diagnoses:  Same  Past Medical History:  Diagnosis Date  . Allergic rhinitis   . ASTHMA, PERSISTENT, MILD 05/08/2006   Qualifier: Diagnosis of  By: Lanier Prude  MD, Cathrine Muster    . Carpal tunnel syndrome 05/08/2006  . Degenerative joint disease of knees 05/01/2010   Qualifier: Diagnosis of  By: Christella Hartigan MD, [name]    . Depression 12/16/2006   Qualifier: Diagnosis of  By: Irving Burton MD, [name]    . ESSENTIAL HYPERTENSION 05/22/2006   Qualifier: Diagnosis of  By: Irving Burton MD, [name]    . GASTROESOPHAGEAL REFLUX, NO ESOPHAGITIS 05/08/2006   Qualifier: Diagnosis of  By: Irving Burton MD, [name]    . Heart murmur    slight due to Rheumatic fever had cardiac studies 2009 Dr. Antoine Poche  . HYPERCHOLESTEROLEMIA 05/08/2006   Qualifier: Diagnosis of  By: Irving Burton MD, [name]    . MIGRAINE NOS W/INTRACTABLE MIGRAINE 11/19/2006   Has been seen by [facility] but currently cannot afford to go back.  Has been tried on multiple medications to minimal effect.  Hydrocodone appears to be the only medication that can abort headaches.    . Rheumatic fever   . Right knee meniscal tear    and loose body  . SINUSITIS, CHRONIC, NOS 05/08/2006  . Spinal headache     Surgeries: Procedure(s): RIGHT KNEE ARTHROSCOPY on 06/17/2016   Consultants:   Discharged Condition: Improved  Hospital Course: Catherine Powell is an 51 y.o. female who was admitted 06/17/2016 for operative treatment ofAcute medial meniscus tear of right knee. Patient has severe unremitting pain that affects sleep, daily activities, and work/hobbies. After pre-op clearance the patient was taken to the operating room on 06/17/2016 and underwent  Procedure(s): RIGHT KNEE ARTHROSCOPY.    Patient was given perioperative antibiotics:  Anti-infectives    Start     Dose/Rate Route Frequency Ordered Stop   06/17/16 0830  ceFAZolin (ANCEF) 3 g in dextrose 5 % 50 mL IVPB     3 g 130 mL/hr over 30 Minutes Intravenous To [facility] 06/14/16 1053 06/17/16 1001       Patient was given sequential compression devices, early ambulation, and chemoprophylaxis to prevent DVT. Pt. ambulated WBAT, with crutches  Patient benefited maximally from hospital stay and there were no complications.    Recent vital signs: Patient Vitals for the past 24 hrs:  BP Temp Temp src Pulse Resp SpO2  06/18/16 0504 128/70 98.6 F (37 C) Oral 67 18 98 %  06/17/16 2255 (!) 106/41 - - 68 18 98 %  06/17/16 2021 (!) 148/55 97.7 F (36.5 C) Oral 72 16 100 %  06/17/16 1634 (!) 131/51 97.8 F (36.6 C) - 61 18 100 %  06/17/16 1230 (!) 150/71 98 F (36.7 C) - 73 18 100 %  06/17/16 1126 (!) 143/83 98 F (36.7 C) - 63 12 100 %  06/17/16 1115 120/73 - - 65 12 99 %  06/17/16 1058 (!) 148/101 - - 64 12 99 %  06/17/16 1043 (!) 152/92 97.7 F (36.5 C) - 72 15 100 %  06/17/16 0724 129/87 98.9 F (37.2 C) Oral 79 20 97 %     Recent laboratory studies:  Recent Labs  06/17/16 0741  WBC 7.3  HGB 13.8  HCT 42.7  PLT 294  NA  137  K 3.8  CL 99*  CO2 26  BUN 8  CREATININE 0.81  GLUCOSE 109*  CALCIUM 8.9     Discharge Medications:   Allergies as of 06/18/2016      Reactions   Tolectin Ds [tolmetin Sodium] Hives, Shortness Of Breath      Medication List    TAKE these medications   albuterol 108 (90 Base) MCG/ACT inhaler Commonly known as:  VENTOLIN HFA Inhale 2 puffs into the lungs every 4 (four) hours as needed for wheezing.   aspirin-acetaminophen-caffeine 250-250-65 MG tablet Commonly known as:  EXCEDRIN MIGRAINE Take 2 tablets by mouth every 6 (six) hours as needed for headache.   atorvastatin 40 MG tablet Commonly known as:  LIPITOR Take 1 tablet (40 mg total) by mouth daily. What changed:  when to take this    budesonide-formoterol 160-4.5 MCG/ACT inhaler Commonly known as:  SYMBICORT Inhale 1 puff into the lungs 2 (two) times daily. What changed:  when to take this  reasons to take this   co-enzyme Q-10 30 MG capsule Take 30 mg by mouth 2 (two) times daily.   esomeprazole 40 MG capsule Commonly known as:  NEXIUM Take 1 capsule (40 mg total) by mouth daily before breakfast.   furosemide 40 MG tablet Commonly known as:  LASIX Take 1 tablet (40 mg total) by mouth daily. What changed:  when to take this  reasons to take this   HYDROcodone-acetaminophen 5-325 MG tablet Commonly known as:  NORCO Take 1-2 tablets by mouth every 4 (four) hours as needed for moderate pain. What changed:  how much to take  when to take this   Melatonin 10 MG Tabs Take 10 mg by mouth at bedtime.   meloxicam 15 MG tablet Commonly known as:  MOBIC Take 1 tablet (15 mg total) by mouth daily. What changed:  when to take this  reasons to take this   montelukast 10 MG tablet Commonly known as:  SINGULAIR Take 1 tablet (10 mg total) by mouth at bedtime. What changed:  when to take this  reasons to take this   nystatin-triamcinolone ointment Commonly known as:  MYCOLOG Apply 1 application topically 2 (two) times daily. What changed:  when to take this  reasons to take this   olopatadine 0.1 % ophthalmic solution Commonly known as:  PATANOL Place 1 drop into both eyes 2 (two) times daily. What changed:  when to take this  reasons to take this   permethrin 5 % cream Commonly known as:  ELIMITE Apply 5% cream to entire body from the neck down, then wash off after 8 to 14 hours   promethazine 25 MG tablet Commonly known as:  PHENERGAN Take 1 tablet (25 mg total) by mouth every 8 (eight) hours as needed for nausea or vomiting.   quinapril-hydrochlorothiazide 20-12.5 MG tablet Commonly known as:  ACCURETIC Take 1 tablet by mouth daily.   sodium chloride 0.65 % Soln nasal  spray Commonly known as:  OCEAN Place 1 spray into both nostrils as needed for congestion.       Diagnostic Studies: Mr Knee Right Wo Contrast  Result Date: 06/05/2016 CLINICAL DATA:  Right knee pain. Fall in December 2017. Popping injury in May 15, 2016. EXAM: MRI OF THE RIGHT KNEE WITHOUT CONTRAST TECHNIQUE: Multiplanar, multisequence MR imaging of the knee was performed. No intravenous contrast was administered. COMPARISON:  05/01/2010 FINDINGS: Due to patient body habitus, the body array coil had to be utilized which reduces  signal to noise ratio. Despite efforts by the technologist and patient, motion artifact is present on today's exam and could not be eliminated. The patient was unable to tolerate repeat acquisitions due to knee pain. This reduces exam sensitivity and specificity. This was the patient's second attempt at imaging. MENISCI Medial meniscus: Diminutive, irregular posterior horn and midbody medial meniscus favoring radial and degenerative tearing. Lateral meniscus:  Unremarkable LIGAMENTS Cruciates:  Unremarkable Collaterals: Mildly thickened proximal MCL without surrounding edema. MCL deviated by the prominent medial marginal spurring. CARTILAGE Patellofemoral: Moderate degenerative chondral thinning with chondral irregularity medially along the femoral trochlear groove. Considerable marginal spurring. Medial: Severe degenerative chondral thinning. Prominent marginal spurring causing meniscal extrusion. Lateral: Mild degenerative chondral thinning with marginal spurring. Joint: Moderate knee effusion with mild synovitis. 1.3 by 0.6 by 1.5 cm filling defect and adjacent 8 mm ossific structure posterolaterally in the knee joint and along the intercondylar notch, suspicious for free osteochondral fragments. These are not well seen on the conventional radiographs but radiographs are from 6 years ago. Popliteal Fossa:  Unremarkable Extensor Mechanism:  Unremarkable Bones: No significant  extra-articular osseous abnormalities identified. Other: No supplemental non-categorized findings. IMPRESSION: 1. Radial and degenerative tearing in the posterior horn and midbody medial meniscus. 2. Suspected free osteochondral fragments posterolaterally in the knee joint. 3. 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 4. Moderate knee effusion with mild synovitis. 5. Mildly thickened proximal MCL may be from an old injury. No surrounding edema. 6. Reduced sensitivity due to body habitus necessitating use of the body coil; and motion artifact. Electronically Signed   By: Gaylyn Rong M.D.   On: 06/05/2016 08:09    Disposition: 01-Home or Self Care  Discharge Instructions    Call MD / Call 911    Complete by:  As directed    If you experience chest pain or shortness of breath, CALL 911 and be transported to the hospital emergency room.  If you develope a fever above 101 F, pus (white drainage) or increased drainage or redness at the wound, or calf pain, call your surgeon's office.   Call MD / Call 911    Complete by:  As directed    If you experience chest pain or shortness of breath, CALL 911 and be transported to the hospital emergency room.  If you develope a fever above 101 F, pus (white drainage) or increased drainage or redness at the wound, or calf pain, call your surgeon's office.   Change dressing    Complete by:  As directed    Change dressing on day 3, then change the dressing daily with sterile 4 x 4 inch gauze dressing.  You may clean the incision with alcohol prior to redressing.   Constipation Prevention    Complete by:  As directed    Drink plenty of fluids.  Prune juice may be helpful.  You may use a stool softener, such as Colace (over the counter) 100 mg twice a day.  Use MiraLax (over the counter) for constipation as needed.   Constipation Prevention    Complete by:  As directed    Drink plenty of fluids.  Prune juice may be helpful.  You may use a  stool softener, such as Colace (over the counter) 100 mg twice a day.  Use MiraLax (over the counter) for constipation as needed.   Diet - low sodium heart healthy    Complete by:  As directed    Diet - low sodium heart healthy  Complete by:  As directed    Discharge instructions    Complete by:  As directed    WBAT with Crutches   Driving restrictions    Complete by:  As directed    No driving for 2 weeks   Increase activity slowly as tolerated    Complete by:  As directed    Increase activity slowly as tolerated    Complete by:  As directed    Patient may shower    Complete by:  As directed    You may shower without a dressing once there is no drainage.  Do not wash over the wound.  If drainage remains, cover wound with plastic wrap and then shower.      Follow-up Information    Nestor Lewandowsky, MD Follow up in 10 day(s).   Specialty:  Orthopedic Surgery Contact information: [address] [place] [address] [phone]            Signed: Nestor Lewandowsky 06/18/2016, 7:14 AM

## 2016-06-18 NOTE — Care Management Note (Signed)
Case Management Note  Patient Details  Name: Catherine Powell MRN: [id number] Date of Birth: 1965-04-25  Subjective/Objective:     51 yr old female s/p Right knee arthroscopic partial medial and lateral meniscectomies.             Action/Plan: Case manager spoke with patient concerning discharge needs. Patient will not have Home Health at this time. Will follow up with Dr. Turner Daniels as to when outpatient therapy will start. She requests a 3in1. Patient will have family support at discharge.    Expected Discharge Date:  06/18/16               Expected Discharge Plan:   Home Self Care  In-House Referral:  NA  Discharge planning Services  CM Consult  Post Acute Care Choice:  Durable Medical Equipment Choice offered to:  NA  DME Arranged:  3-N-1 DME Agency:  [facility]  HH Arranged:  NA HH Agency:  NA  Status of Service:  Completed, signed off  If discussed at Long Length of Stay Meetings, dates discussed:    Additional Comments:  Durenda Guthrie, RN 06/18/2016, 10:13 AM

## 2016-07-12 ENCOUNTER — Encounter: Payer: Self-pay | Admitting: Student

## 2016-07-12 ENCOUNTER — Telehealth: Payer: Self-pay | Admitting: *Deleted

## 2016-07-12 NOTE — Telephone Encounter (Signed)
Pt calling in wanting dr Alanda Slimgonfa to call pharmacy to approve her Vicodin to be filled today instead of the 8th. Dr Rexene Agentroland  Gave her the percocet's but she doesn't want to take those/she is out of them. Please advise. Catherine Powell, CMA

## 2016-08-12 ENCOUNTER — Ambulatory Visit: Payer: Managed Care, Other (non HMO) | Admitting: Student

## 2016-08-12 ENCOUNTER — Other Ambulatory Visit: Payer: Self-pay | Admitting: Student

## 2016-08-12 DIAGNOSIS — G8929 Other chronic pain: Secondary | ICD-10-CM

## 2016-08-12 DIAGNOSIS — L0390 Cellulitis, unspecified: Secondary | ICD-10-CM

## 2016-08-12 MED ORDER — HYDROCODONE-ACETAMINOPHEN 5-325 MG PO TABS: 1.0000 | ORAL_TABLET | Freq: Three times a day (TID) | ORAL | 0 refills | Status: DC | PRN

## 2016-08-12 MED ORDER — CEPHALEXIN 500 MG PO CAPS: 500.0000 mg | ORAL_CAPSULE | Freq: Four times a day (QID) | ORAL | 0 refills | Status: AC

## 2016-08-12 NOTE — Progress Notes (Signed)
Gave Keflex for possible cellulitis of her right ankle. Refilled her pain meds as well. Gave her three Rxs for three months.

## 2016-09-20 NOTE — Anesthesia Postprocedure Evaluation (Signed)
Anesthesia Post Note  Patient: Catherine Powell  Procedure(s) Performed: Procedure(s) (LRB): RIGHT KNEE ARTHROSCOPY (Right)     Anesthesia Post Evaluation  Last Vitals:  Vitals:   06/18/16 0504 06/18/16 0757  BP: 128/70 125/74  Pulse: 67 70  Resp: 18 18  Temp: 37 C 36.9 C    Last Pain:  Vitals:   06/18/16 1115  TempSrc:   PainSc: 3                  Arushi Partridge EDWARD     

## 2016-09-20 NOTE — Addendum Note (Signed)
Addendum  created 09/20/16 1107 by Cristela BlueJackson, Tanica Gaige, MD   Sign clinical note

## 2016-09-20 NOTE — Addendum Note (Signed)
Addendum  created 09/20/16 1108 by Cristela BlueJackson, Macari Zalesky, MD   Sign clinical note

## 2016-09-20 NOTE — Addendum Note (Signed)
Addendum  created 09/20/16 1109 by Cristela BlueJackson, Azarion Hove, MD   Sign clinical note

## 2016-09-20 NOTE — Anesthesia Postprocedure Evaluation (Signed)
Anesthesia Post Note  Patient: Catherine Powell  Procedure(s) Performed: Procedure(s) (LRB): RIGHT KNEE ARTHROSCOPY (Right)     Anesthesia Post Evaluation  Last Vitals:  Vitals:   06/18/16 0504 06/18/16 0757  BP: 128/70 125/74  Pulse: 67 70  Resp: 18 18  Temp: 37 C 36.9 C    Last Pain:  Vitals:   06/18/16 1115  TempSrc:   PainSc: 3                  Toan Mort EDWARD     

## 2016-09-20 NOTE — Anesthesia Postprocedure Evaluation (Signed)
Anesthesia Post Note  Patient: Catherine SalvoBeverly A Powell  Procedure(s) Performed: Procedure(s) (LRB): RIGHT KNEE ARTHROSCOPY (Right)     Anesthesia Post Evaluation  Last Vitals:  Vitals:   06/18/16 0504 06/18/16 0757  BP: 128/70 125/74  Pulse: 67 70  Resp: 18 18  Temp: 37 C 36.9 C    Last Pain:  Vitals:   06/18/16 1115  TempSrc:   PainSc: 3                  Markiah Janeway EDWARD

## 2016-09-20 NOTE — Anesthesia Postprocedure Evaluation (Signed)
Anesthesia Post Note  Patient: Catherine Powell  Procedure(s) Performed: Procedure(s) (LRB): RIGHT KNEE ARTHROSCOPY (Right)     Anesthesia Post Evaluation  Last Vitals:  Vitals:   06/18/16 0504 06/18/16 0757  BP: 128/70 125/74  Pulse: 67 70  Resp: 18 18  Temp: 37 C 36.9 C    Last Pain:  Vitals:   06/18/16 1115  TempSrc:   PainSc: 3                  Merlin Golden EDWARD     

## 2016-09-20 NOTE — Addendum Note (Signed)
Addendum  created 09/20/16 1105 by Cristela BlueJackson, Sadik Piascik, MD   Sign clinical note

## 2016-11-01 ENCOUNTER — Encounter: Payer: Self-pay | Admitting: Student

## 2016-11-04 ENCOUNTER — Ambulatory Visit (INDEPENDENT_AMBULATORY_CARE_PROVIDER_SITE_OTHER): Payer: Managed Care, Other (non HMO) | Admitting: Student

## 2016-11-04 ENCOUNTER — Encounter: Payer: Self-pay | Admitting: Student

## 2016-11-04 VITALS — BP 114/68 | HR 71 | Temp 98.4°F | Ht 69.0 in | Wt >= 6400 oz

## 2016-11-04 DIAGNOSIS — G8929 Other chronic pain: Secondary | ICD-10-CM | POA: Diagnosis not present

## 2016-11-04 DIAGNOSIS — M542 Cervicalgia: Secondary | ICD-10-CM

## 2016-11-04 DIAGNOSIS — E7800 Pure hypercholesterolemia, unspecified: Secondary | ICD-10-CM | POA: Diagnosis not present

## 2016-11-04 DIAGNOSIS — K219 Gastro-esophageal reflux disease without esophagitis: Secondary | ICD-10-CM

## 2016-11-04 DIAGNOSIS — R110 Nausea: Secondary | ICD-10-CM

## 2016-11-04 DIAGNOSIS — Z23 Encounter for immunization: Secondary | ICD-10-CM

## 2016-11-04 DIAGNOSIS — M170 Bilateral primary osteoarthritis of knee: Secondary | ICD-10-CM | POA: Diagnosis not present

## 2016-11-04 DIAGNOSIS — I10 Essential (primary) hypertension: Secondary | ICD-10-CM | POA: Diagnosis not present

## 2016-11-04 MED ORDER — HYDROCODONE-ACETAMINOPHEN 5-325 MG PO TABS: 1.0000 | ORAL_TABLET | Freq: Three times a day (TID) | ORAL | 0 refills | Status: DC | PRN

## 2016-11-04 MED ORDER — QUINAPRIL-HYDROCHLOROTHIAZIDE 20-12.5 MG PO TABS: 1.0000 | ORAL_TABLET | Freq: Every day | ORAL | 3 refills | Status: DC

## 2016-11-04 MED ORDER — ATORVASTATIN CALCIUM 40 MG PO TABS: 40.0000 mg | ORAL_TABLET | Freq: Every evening | ORAL | 3 refills | Status: DC

## 2016-11-04 MED ORDER — PROMETHAZINE HCL 25 MG PO TABS: 25.0000 mg | ORAL_TABLET | Freq: Three times a day (TID) | ORAL | 0 refills | Status: DC | PRN

## 2016-11-04 MED ORDER — NALOXONE HCL 0.4 MG/ML IJ SOLN: 0.4000 mg | Freq: Once | INTRAMUSCULAR | 0 refills | Status: AC

## 2016-11-04 MED ORDER — ESOMEPRAZOLE MAGNESIUM 40 MG PO CPDR: 40.0000 mg | DELAYED_RELEASE_CAPSULE | Freq: Every day | ORAL | 0 refills | Status: DC

## 2016-11-04 NOTE — Patient Instructions (Addendum)
It was great seeing you today! We have addressed the following issues today 1.   Knee pain: We have refilled your Norco today. I also recommend using weights 2.  Neck pain: This is likely due to a muscle spasm. I recommend ice or heat as needed in addition to the pain medications.   If we did any lab work today, and the results require attention, either me or my nurse will get in touch with you. If everything is normal, you will get a letter in mail and a message via . If you don't hear from [place] in two weeks, please give [place] a call. Otherwise, we look forward to seeing you again at your next visit. If you have any questions or concerns before then, please call the clinic at [phone].  Please bring all your medications to every doctors visit  Sign up for My Chart to have easy access to your labs results, and communication with your Primary care physician.    Please check-out at the front desk before leaving the clinic.    Take Care,   Dr. Alanda Slim

## 2016-11-04 NOTE — Addendum Note (Signed)
Addended by: Lamonte Sakai, APRIL D on: 11/04/2016 03:45 PM   Modules accepted: Orders, SmartSet

## 2016-11-04 NOTE — Progress Notes (Addendum)
Subjective:    Catherine Powell is a 51 y.o. old female here for medication refills  HPI Bilateral knee pain: patient with chronic osteoarthritis in both knees. She says she was told by orthopedic surgeon that she needs knee replacement. However, her BMI is very high and she has to lose weight. She described the pain as achiness. No radiation. Pain is aggravated by walking and improved by pain medicine (Norco). No changes to the nature of her pain. She denies numbness, tingling, fever, night sweat, bowel or bladder issues or recent fall.   Neck pain: mainly over the left side of her neck. She had instrumentations in the neck. She thinks this might be related to her pillow or the way she sleeps. She denies numbness, tingling or weakness in her arms or legs. Denies recent trauma or injury or fall. PMH/Problem List: has HYPERCHOLESTEROLEMIA; OBESITY, NOS; Migraine; Essential hypertension; Allergic rhinitis; Asthma, chronic; GASTROESOPHAGEAL REFLUX, NO ESOPHAGITIS; Cervical radiculopathy; Degenerative joint disease of knees; Vitamin D deficiency; Encounter for chronic pain management; Healthcare maintenance; Rash and nonspecific skin eruption; Chronic venous insufficiency; Dental abscess; and Acute medial meniscus tear of right knee on her problem list.   has a past medical history of Allergic rhinitis; ASTHMA, PERSISTENT, MILD (05/08/2006); Carpal tunnel syndrome (05/08/2006); Degenerative joint disease of knees (05/01/2010); Depression (12/16/2006); ESSENTIAL HYPERTENSION (05/22/2006); GASTROESOPHAGEAL REFLUX, NO ESOPHAGITIS (05/08/2006); Heart murmur; HYPERCHOLESTEROLEMIA (05/08/2006); MIGRAINE NOS W/INTRACTABLE MIGRAINE (11/19/2006); Rheumatic fever; Right knee meniscal tear; SINUSITIS, CHRONIC, NOS (05/08/2006); and Spinal headache.  FH:  Family History  Problem Relation Age of Onset  . Migraines Mother     SH Social History  Substance Use Topics  . Smoking status: Never Smoker  . Smokeless tobacco: Never  Used  . Alcohol use Yes     Comment: rare    Review of Systems Review of systems negative except for pertinent positives and negatives in history of present illness above.     Objective:     Vitals:   11/04/16 1402  BP: 114/68  Pulse: 71  Temp: 98.4 F (36.9 C)  TempSrc: Oral  SpO2: 97%  Weight: (!) 406 lb 12.8 oz (184.5 kg)  Height: 5\' 9"  (1.753 m)    Physical Exam GEN: Morbidly obese. No apparent distress. Head: normocephalic and atraumatic  CVS: RRR, nl S1&S2, no murmurs, no edema RESP: no IWOB, good air movement bilaterally, CTAB MSK: No apparent swelling or overlying skin erythema in her neck. Tenderness to palpation over the posterior aspect of her neck mainly over the left trapezius muscles. No significant tenderness over her cervical spines. Motor and light sensation within normal limits in both arms and legs. SKIN: no apparent skin lesion NEURO: alert and oiented appropriately, no gross deficits  PSYCH: euthymic mood with congruent affect    Assessment and Plan:  1. Primary osteoarthritis of both knees (chronic): stable without red flags. [place] database reviewed and no red flags noted. She was recommended knee replacement surgery by orthopedic surgeons. Unfortunately BMI to too high. Gave prescription for Narcan as well. - HYDROcodone-acetaminophen (NORCO) 5-325 MG tablet; Take 1 tablet by mouth every 8 (eight) hours as needed for moderate pain.  Dispense: 90 tablet; Refill: 0 - HYDROcodone-acetaminophen (NORCO) 5-325 MG tablet; Take 1 tablet by mouth every 8 (eight) hours as needed for moderate pain.  Dispense: 90 tablet; Refill: 0 - HYDROcodone-acetaminophen (NORCO) 5-325 MG tablet; Take 1 tablet by mouth every 8 (eight) hours as needed for moderate pain.  Dispense: 90 tablet; Refill: 0  2. Neck pain:  likely muscle strain. She is tender to palpation over her trapezius muscles on the left. No tenderness over cervical spine. ROM within normal but with pain. No neurologic  symptoms in her upper and lower extremities.  -Norco as above -Recommended heat or ice   3. Intermittent Nausea in adult due to migraine - promethazine (PHENERGAN) 25 MG tablet; Take 1 tablet (25 mg total) by mouth every 8 (eight) hours as needed for nausea or vomiting.  Dispense: 20 tablet; Refill: 0 - Warned about the side effect of this medication especially when taken with Norco.   4. HYPERCHOLESTEROLEMIA - atorvastatin (LIPITOR) 40 MG tablet; Take 1 tablet (40 mg total) by mouth every evening.  Dispense: 90 tablet; Refill: 3 - We will check lipid panel next visit  5. Essential hypertension - Normotensive - quinapril-hydrochlorothiazide (ACCURETIC) 20-12.5 MG tablet; Take 1 tablet by mouth daily.  Dispense: 90 tablet; Refill: 3  6. Gastroesophageal reflux disease, esophagitis presence not specified - esomeprazole (NEXIUM) 40 MG capsule; Take 1 capsule (40 mg total) by mouth daily before breakfast.  Dispense: 90 capsule; Refill: 0 - We'll check vitamin B12 level next visit  Received her flu and Tdap vaccines today.  Likes to wait until her birthday for colonoscopy.  Return in about 3 months (around 02/04/2017) for Pain.  Almon Hercules, MD 11/04/16 Pager: [phone]

## 2016-12-05 ENCOUNTER — Ambulatory Visit (INDEPENDENT_AMBULATORY_CARE_PROVIDER_SITE_OTHER): Payer: Managed Care, Other (non HMO) | Admitting: Student

## 2016-12-05 ENCOUNTER — Encounter: Payer: Self-pay | Admitting: Student

## 2016-12-05 VITALS — BP 118/72 | HR 85 | Temp 98.0°F | Ht 69.0 in | Wt >= 6400 oz

## 2016-12-05 DIAGNOSIS — Z1211 Encounter for screening for malignant neoplasm of colon: Secondary | ICD-10-CM | POA: Diagnosis not present

## 2016-12-05 DIAGNOSIS — Z5181 Encounter for therapeutic drug level monitoring: Secondary | ICD-10-CM

## 2016-12-05 DIAGNOSIS — E559 Vitamin D deficiency, unspecified: Secondary | ICD-10-CM | POA: Diagnosis not present

## 2016-12-05 DIAGNOSIS — B372 Candidiasis of skin and nail: Secondary | ICD-10-CM

## 2016-12-05 DIAGNOSIS — E7800 Pure hypercholesterolemia, unspecified: Secondary | ICD-10-CM

## 2016-12-05 MED ORDER — KETOCONAZOLE 2 % EX CREA: TOPICAL_CREAM | CUTANEOUS | 0 refills | Status: DC

## 2016-12-05 NOTE — Assessment & Plan Note (Signed)
Lesion morphology and location consistent with intertrigo. She is morbidly obese contributing this. She has no systemic symptoms. Doubt eczema without pruritus.  - ketoconazole (NIZORAL) 2 % cream; Apply a thin layer of the cream to affected area once daily for 2-3 weeks.  Dispense: 60 g; Refill: 0

## 2016-12-05 NOTE — Progress Notes (Signed)
  Subjective:    Catherine Powell is a 50 y.o. old female here skin rash  HPI Skin rash: for two weeks. Started on the back of her neck and under her breasts and spread to her arms and armpits. Not itching. Looks red. She never had rash like this before. Denies fever chills, new medicine, soap or detergent. She had dental infection about two weeks ago. She manged it with ASA. No antibiotic.   PMH/Problem List: has HYPERCHOLESTEROLEMIA; OBESITY, NOS; Migraine; Essential hypertension; Allergic rhinitis; Asthma, chronic; GASTROESOPHAGEAL REFLUX, NO ESOPHAGITIS; Cervical radiculopathy; Degenerative joint disease of knees; Vitamin D deficiency; Encounter for chronic pain management; Healthcare maintenance; Rash and nonspecific skin eruption; Chronic venous insufficiency; Dental abscess; and Acute medial meniscus tear of right knee on her problem list.   has a past medical history of Allergic rhinitis; ASTHMA, PERSISTENT, MILD (05/08/2006); Carpal tunnel syndrome (05/08/2006); Degenerative joint disease of knees (05/01/2010); Depression (12/16/2006); ESSENTIAL HYPERTENSION (05/22/2006); GASTROESOPHAGEAL REFLUX, NO ESOPHAGITIS (05/08/2006); Heart murmur; HYPERCHOLESTEROLEMIA (05/08/2006); MIGRAINE NOS W/INTRACTABLE MIGRAINE (11/19/2006); Rheumatic fever; Right knee meniscal tear; SINUSITIS, CHRONIC, NOS (05/08/2006); and Spinal headache.  FH:  Family History  Problem Relation Age of Onset  . Migraines Mother     SH Social History  Substance Use Topics  . Smoking status: Never Smoker  . Smokeless tobacco: Never Used  . Alcohol use Yes     Comment: rare    Review of Systems Review of systems negative except for pertinent positives and negatives in history of present illness above.     Objective:     Vitals:   12/05/16 1438  BP: 118/72  Pulse: 85  Temp: 98 F (36.7 C)  TempSrc: Oral  SpO2: 97%  Weight: (!) 408 lb 12.8 oz (185.4 kg)  Height:  (1.753 m)   Body mass index is 60.37  kg/m.  Physical Exam GEN: appears well, morbidly obese, no apparent distress. Eyes: conjunctiva without injection, sclera anicteric Oropharynx: mmm without erythema or exudation HEM: negative for cervical or periauricular lymphadenopathies CVS: RRR, nl S1&S2 RESP: no IWOB MSK: no focal tenderness or notable swelling SKIN: erythematous skin lesion under her breast, in her axilla, antecubital foss and back of her neck. No excoriation.  NEURO: alert and oiented appropriately, no gross deficits  PSYCH: euthymic mood with congruent affect    Assessment and Plan:  1. Candidiasis, intertrigo: lesion morphology and location consistent with intertrigo. She is morbidly obese contributing this. She has no systemic symptoms. Doubt eczema without pruritus.  - ketoconazole (NIZORAL) 2 % cream; Apply a thin layer of the cream to affected area once daily for 2-3 weeks.  Dispense: 60 g; Refill: 0  2. HYPERCHOLESTEROLEMIA.  - Lipid panel - Mas stop statin if ASCVD risk low. ASCVD risk 2% based on old labs  3. Vitamin D deficiency - VITAMIN D 25 Hydroxy (Vit-D Deficiency, Fractures)  4. Therapeutic drug monitoring: patient on chronic PPI - Vitamin B12  5. Screening for colon cancer: she has been resisting this. She likes to wait until her next birthday.   No Follow-up on file.  Almon Hercules, MD 12/05/16 Pager: [phone]

## 2016-12-05 NOTE — Patient Instructions (Signed)
It was great seeing you today! We have addressed the following issues today 1. Skin rash: this is likely a yeast infection. We sent a prescription for ketoconazole cream to your pharmacy. Please use this cream for 2-3 weeks.  If we did any lab work today, and the results require attention, either me or my nurse will get in touch with you. If everything is normal, you will get a letter in mail and a message via . If you don't hear from [place] in two weeks, please give [place] a call. Otherwise, we look forward to seeing you again at your next visit. If you have any questions or concerns before then, please call the clinic at [phone].  Please bring all your medications to every doctors visit  Sign up for My Chart to have easy access to your labs results, and communication with your Primary care physician.    Please check-out at the front desk before leaving the clinic.    Take Care,   Dr. Neill Loft Intertrigo is skin irritation (inflammation) that happens in warm, moist areas of the body. The irritation can cause a rash and make skin raw and itchy. The rash is usually pink or red. It happens mostly between folds of skin or where skin rubs together, such as:  Toes.  Armpits.  Groin.  Belly.  Breasts.  Buttocks.  This condition is not passed from person to person (is not contagious). Follow these instructions at home:  Keep the affected area clean and dry.  Do not scratch your skin.  Stay cool as much as possible. Use an air conditioner or fan, if you can.  Apply over-the-counter and prescription medicines only as told by your doctor.  If you were prescribed an antibiotic medicine, use it as told by your doctor. Do not stop using the antibiotic even if your condition starts to get better.  Keep all follow-up visits as told by your doctor. This is important. How is this prevented?  Stay at a healthy weight.  Keep your feet dry. This is very important if you have  diabetes. Wear cotton or wool socks.  Take care of and protect the skin in your groin and butt area as told by your doctor.  Do not wear tight clothes. Wear clothes that: ? Are loose. ? Take away moisture from your body. ? Are made of cotton.  Wear a bra that gives good support, if needed.  Shower and dry yourself fully after being active.  Keep your blood sugar under control if you have diabetes. Contact a doctor if:  Your symptoms do not get better with treatment.  Your symptoms get worse or they spread.  You notice more redness and warmth.  You have a fever. This information is not intended to replace advice given to you by your health care provider. Make sure you discuss any questions you have with your health care provider. Document Released: 03/30/2010 Document Revised: 08/03/2015 Document Reviewed: 08/29/2014 Elsevier Interactive Patient Education  [employer].

## 2016-12-06 LAB — LIPID PANEL
CHOL/HDL RATIO: 4.5 ratio — AB (ref 0.0–4.4)
Cholesterol, Total: 202 mg/dL — ABNORMAL HIGH (ref 100–199)
HDL: 45 mg/dL (ref >39)
LDL Calculated: 134 mg/dL — ABNORMAL HIGH (ref 0–99)
TRIGLYCERIDES: 115 mg/dL (ref 0–149)
VLDL Cholesterol Cal: 23 mg/dL (ref 5–40)

## 2016-12-06 LAB — VITAMIN B12: VITAMIN B 12: 459 pg/mL (ref 232–1245)

## 2016-12-06 LAB — VITAMIN D 25 HYDROXY (VIT D DEFICIENCY, FRACTURES): Vit D, 25-Hydroxy: 12.1 ng/mL — ABNORMAL LOW (ref 30.0–100.0)

## 2016-12-09 ENCOUNTER — Encounter: Payer: Self-pay | Admitting: Student

## 2016-12-10 ENCOUNTER — Other Ambulatory Visit: Payer: Self-pay | Admitting: Student

## 2016-12-10 DIAGNOSIS — E559 Vitamin D deficiency, unspecified: Secondary | ICD-10-CM

## 2016-12-10 MED ORDER — MULTIVITAMIN WOMEN 50+ PO TABS: 1.0000 | ORAL_TABLET | Freq: Every day | ORAL | 3 refills | Status: DC

## 2016-12-10 MED ORDER — VITAMIN D3 1.25 MG (50000 UT) PO TABS: 1.0000 | ORAL_TABLET | ORAL | 0 refills | Status: DC

## 2016-12-14 ENCOUNTER — Encounter: Payer: Self-pay | Admitting: Student

## 2016-12-16 ENCOUNTER — Ambulatory Visit (INDEPENDENT_AMBULATORY_CARE_PROVIDER_SITE_OTHER): Payer: Managed Care, Other (non HMO) | Admitting: Family Medicine

## 2016-12-16 ENCOUNTER — Encounter: Payer: Self-pay | Admitting: Family Medicine

## 2016-12-16 VITALS — BP 118/76 | HR 74 | Temp 98.1°F | Wt >= 6400 oz

## 2016-12-16 DIAGNOSIS — L03115 Cellulitis of right lower limb: Secondary | ICD-10-CM | POA: Insufficient documentation

## 2016-12-16 DIAGNOSIS — L03116 Cellulitis of left lower limb: Secondary | ICD-10-CM | POA: Diagnosis not present

## 2016-12-16 DIAGNOSIS — R21 Rash and other nonspecific skin eruption: Secondary | ICD-10-CM

## 2016-12-16 MED ORDER — AQUA GLYCOLIC SHAMPOO/BODY EX LIQD: 1.0000 "application " | Freq: Every day | CUTANEOUS | 0 refills | Status: AC

## 2016-12-16 MED ORDER — TRIAMCINOLONE ACETONIDE 0.5 % EX OINT: 1.0000 "application " | TOPICAL_OINTMENT | Freq: Two times a day (BID) | CUTANEOUS | 1 refills | Status: DC

## 2016-12-16 MED ORDER — CEPHALEXIN 500 MG PO CAPS: 500.0000 mg | ORAL_CAPSULE | Freq: Four times a day (QID) | ORAL | 0 refills | Status: AC

## 2016-12-16 MED ORDER — FLUCONAZOLE 150 MG PO TABS: 150.0000 mg | ORAL_TABLET | Freq: Every day | ORAL | 0 refills | Status: DC

## 2016-12-16 NOTE — Patient Instructions (Signed)
   It was nice to meet you today!  Let's try steroid ointment on your rash. I have referred you to dermatology, you will hear from our office to schedule an appointment.  Please take keflex for your lower extremity cellulitis. I have also prescribed diflucan in case you get a yeast infection.   Please return to be seen if your symptoms worsen or if you develop fevers, chills, or worsening redness of your legs.   If you have questions or concerns please do not hesitate to call at [phone].  Dolores Patty, DO PGY-2, [facility] 12/16/2016 2:18 PM

## 2016-12-16 NOTE — Assessment & Plan Note (Signed)
  Failed tx with topical nystatin and ketaconazole. Rash does appear consistent with candida and is in skin folds however no improvement. Some concern for atypical eczema. Will try topical steroid and refer to derm per patient's preference.

## 2016-12-16 NOTE — Assessment & Plan Note (Signed)
  Secondary to chronic venous stasis and weight.   -rx given for keflex, diflucan per pt request as she gets yeast infections after antibiotic treatment -return precautions given for fevers, chills, worsening redness or swelling of LE -Patient verbalized understanding and agreement with plan

## 2016-12-16 NOTE — Progress Notes (Signed)
    Subjective:    Patient ID: Catherine Powell, female    DOB: [birth year], 51 y.o.   MRN: [id number]  CC: rash  HPI: Has been using ointment for past week however rash persistent in elbows, neck, under breasts. Some improvement with ketoconazole in that the rash is less painful but it is the same size and same amount of redness. Does not itch. She states she has sensitive skin but has not had a rash like this before. She is frustrated it has not cleared up and wants to go to dermatology.   She is also concerned that her lower legs are weeping and infected again. She has frequent cellulitis in her LE and "antibiotics are the only thing that knocks it out". She states her LE itch a lot and she scratches at them. She reports putting nystatin ointment on them already.   Smoking status reviewed- non-smoker  Review of Systems- no fevers, chills, nausea, vomiting.   Objective:  BP 118/76   Pulse 74   Temp 98.1 F (36.7 C) (Oral)   Wt (!) 408 lb (185.1 kg)   LMP 05/15/2012   SpO2 96%   BMI 60.25 kg/m  Vitals and nursing note reviewed  General: morbidly obese women in NAD Cardiac: RRR, clear S1 and S2, no murmurs, rubs, or gallops Respiratory: clear to auscultation bilaterally, no increased work of breathing Extremities: chronic venous stasis changes in LE with cracking skin on anterior shin, some redness. No weeping.  Skin: warm and dry, red, clearly demarcated rash under breasts, around neck, and in antecubital fossa bilaterally. Rash is flat and shiny with some flaking skin. Not tender. No drainage.  Neuro: alert and oriented, no focal deficits  Assessment & Plan:    Rash and nonspecific skin eruption  Failed tx with topical nystatin and ketaconazole. Rash does appear consistent with candida and is in skin folds however no improvement. Some concern for atypical eczema. Will try topical steroid and refer to derm per patient's preference.   Cellulitis of both lower  extremities  Secondary to chronic venous stasis and weight.   -rx given for keflex, diflucan per pt request as she gets yeast infections after antibiotic treatment -return precautions given for fevers, chills, worsening redness or swelling of LE -Patient verbalized understanding and agreement with plan   Return if symptoms worsen or fail to improve.   Dolores Patty, DO Family Medicine Resident PGY-2

## 2016-12-19 ENCOUNTER — Telehealth: Payer: Self-pay | Admitting: *Deleted

## 2016-12-19 NOTE — Telephone Encounter (Signed)
Received fax from [facility] stating Aqua Glycolic shampoo is not available. Please send in an alternative.  Clovis Pu, RN

## 2016-12-24 ENCOUNTER — Other Ambulatory Visit: Payer: Self-pay | Admitting: Student

## 2016-12-30 ENCOUNTER — Other Ambulatory Visit: Payer: Self-pay | Admitting: Family Medicine

## 2016-12-30 ENCOUNTER — Other Ambulatory Visit: Payer: Self-pay | Admitting: Student

## 2016-12-30 DIAGNOSIS — R110 Nausea: Secondary | ICD-10-CM

## 2017-01-08 ENCOUNTER — Encounter: Payer: Self-pay | Admitting: Student

## 2017-01-08 ENCOUNTER — Ambulatory Visit (INDEPENDENT_AMBULATORY_CARE_PROVIDER_SITE_OTHER): Payer: Managed Care, Other (non HMO) | Admitting: Student

## 2017-01-08 VITALS — BP 116/68 | HR 71 | Temp 98.5°F | Ht 69.0 in | Wt >= 6400 oz

## 2017-01-08 DIAGNOSIS — R21 Rash and other nonspecific skin eruption: Secondary | ICD-10-CM

## 2017-01-08 DIAGNOSIS — G43109 Migraine with aura, not intractable, without status migrainosus: Secondary | ICD-10-CM

## 2017-01-08 MED ORDER — ONDANSETRON HCL 4 MG/2ML IJ SOLN
4.0000 mg | Freq: Once | INTRAMUSCULAR | Status: AC
  Administered 2017-01-08: 4 mg via INTRAMUSCULAR

## 2017-01-08 MED ORDER — DEXAMETHASONE SODIUM PHOSPHATE 10 MG/ML IJ SOLN
10.0000 mg | Freq: Once | INTRAMUSCULAR | Status: AC
  Administered 2017-01-08: 10 mg via INTRAMUSCULAR

## 2017-01-08 MED ORDER — KETOROLAC TROMETHAMINE 30 MG/ML IJ SOLN
30.0000 mg | Freq: Once | INTRAMUSCULAR | Status: AC
Start: 2017-01-08 — End: 2017-01-08
  Administered 2017-01-08: 30 mg via INTRAMUSCULAR

## 2017-01-08 NOTE — Patient Instructions (Addendum)
It was great seeing you today! We have addressed the following issues today  1. Migraine headache: we gave you some medications including dexamethasone,  Zofran and Toradol today. You can also try ibuprofen 600 mg 3 times a day as needed for pain. 2. Skin rash: I am glad this has gotten better. Continue the recommendations by dermatologist.   If we did any lab work today, and the results require attention, either me or my nurse will get in touch with you. If everything is normal, you will get a letter in mail and a message via . If you don't hear from us in two weeks, please give us a call. Otherwise, we look forward to seeing you again at your next visit. If you have any questions or concerns before then, please call the clinic at [phone].  Please bring all your medications to every doctors visit  Sign up for My Chart to have easy access to your labs results, and communication with your Primary care physician.    Please check-out at the front desk before leaving the clinic.    Take Care,   Dr. Alanda SlimGonfa    Migraine Headache A migraine headache is a very strong throbbing pain on one side or both sides of your head. Migraines can also cause other symptoms. Talk with your doctor about what things may bring on (trigger) your migraine headaches. Follow these instructions at home: Medicines  Take over-the-counter and prescription medicines only as told by your doctor.  Do not drive or use heavy machinery while taking prescription pain medicine.  To prevent or treat constipation while you are taking prescription pain medicine, your doctor may recommend that you: ? Drink enough fluid to keep your pee (urine) clear or pale yellow. ? Take over-the-counter or prescription medicines. ? Eat foods that are high in fiber. These include fresh fruits and vegetables, whole grains, and beans. ? Limit foods that are high in fat and processed sugars. These include fried and sweet  foods. Lifestyle  Avoid alcohol.  Do not use any products that contain nicotine or tobacco, such as cigarettes and e-cigarettes. If you need help quitting, ask your doctor.  Get at least 8 hours of sleep every night.  Limit your stress. General instructions   Keep a journal to find out what may bring on your migraines. For example, write down: ? What you eat and drink. ? How much sleep you get. ? Any change in what you eat or drink. ? Any change in your medicines.  If you have a migraine: ? Avoid things that make your symptoms worse, such as bright lights. ? It may help to lie down in a dark, quiet room. ? Do not drive or use heavy machinery. ? Ask your doctor what activities are safe for you.  Keep all follow-up visits as told by your doctor. This is important. Contact a doctor if:  You get a migraine that is different or worse than your usual migraines. Get help right away if:  Your migraine gets very bad.  You have a fever.  You have a stiff neck.  You have trouble seeing.  Your muscles feel weak or like you cannot control them.  You start to lose your balance a lot.  You start to have trouble walking.  You pass out (faint). This information is not intended to replace advice given to you by your health care provider. Make sure you discuss any questions you have with your health care provider. Document  Released: 12/05/2007 Document Revised: 09/15/2015 Document Reviewed: 08/14/2015 Elsevier Interactive Patient Education  2017 [employer].

## 2017-01-08 NOTE — Progress Notes (Signed)
  Subjective:    Catherine Powell is a 51 y.o. old female here for migrainous skin rash.  Patient is here with her husband.  HPI Migraine headache: she woke up with headache. This is her typical migraine. Reports nausea but denies emesis or fever.  Admits photophobia but denies phonophobia.  She denies vision change.  Denies focal neuro deficits.  She tried Advil without significant improvement.  She also have Norco for her chronic pain.  Skin rash: improved. She is being treated for seborrheic dermatitis over his head and yeast infection.  She is followed by dermatologist.  She reports significant improvement in her rash.  PMH/Problem List: has HYPERCHOLESTEROLEMIA; OBESITY, NOS; Migraine; Essential hypertension; Allergic rhinitis; Asthma, chronic; GASTROESOPHAGEAL REFLUX, NO ESOPHAGITIS; Cervical radiculopathy; Degenerative joint disease of knees; Vitamin D deficiency; Encounter for chronic pain management; Healthcare maintenance; Rash and nonspecific skin eruption; Chronic venous insufficiency; Dental abscess; Acute medial meniscus tear of right knee; Candidiasis, intertrigo; and Cellulitis of both lower extremities on her problem list.   has a past medical history of Allergic rhinitis; ASTHMA, PERSISTENT, MILD (05/08/2006); Carpal tunnel syndrome (05/08/2006); Degenerative joint disease of knees (05/01/2010); Depression (12/16/2006); ESSENTIAL HYPERTENSION (05/22/2006); GASTROESOPHAGEAL REFLUX, NO ESOPHAGITIS (05/08/2006); Heart murmur; HYPERCHOLESTEROLEMIA (05/08/2006); MIGRAINE NOS W/INTRACTABLE MIGRAINE (11/19/2006); Rheumatic fever; Right knee meniscal tear; SINUSITIS, CHRONIC, NOS (05/08/2006); and Spinal headache.  FH:  Family History  Problem Relation Age of Onset  . Migraines Mother     SH Social History  Substance Use Topics  . Smoking status: Never Smoker  . Smokeless tobacco: Never Used  . Alcohol use Yes     Comment: rare    Review of Systems Review of systems negative except for  pertinent positives and negatives in history of present illness above.     Objective:     Vitals:   01/08/17 1556  BP: 116/68  Pulse: 71  Temp: 98.5 F (36.9 C)  TempSrc: Oral  SpO2: 96%  Weight: (!) 405 lb 9.6 oz (184 kg)  Height: 5\' 9"  (1.753 m)   Body mass index is 59.9 kg/m.  Physical Exam GEN: appears to be in some distress due to pain.  Morbidly obese Head: normocephalic and atraumatic  CVS: RRR, nl S1&S2, no murmurs, no edema RESP: no IWOB, good air movement bilaterally, CTAB GI: BS present & normal, soft, NTND GU: no suprapubic or CVA tenderness MSK: no focal tenderness or notable swelling SKIN: no apparent skin lesion NEURO: alert and oiented appropriately, no gross deficits  PSYCH: euthymic mood with congruent affect    Assessment and Plan:   1. Migraine with aura and without status migrainosus, not intractable: Migraine cocktail as below - ondansetron (ZOFRAN) injection 4 mg; Inject 2 mLs (4 mg total) into the muscle once. - ketorolac (TORADOL) 30 MG/ML injection 30 mg; Inject 1 mL (30 mg total) into the muscle once. - dexamethasone (DECADRON) injection 10 mg; Inject 1 mL (10 mg total) into the muscle once.  2. Rash and nonspecific skin eruption: Improved.  She was prescribed triamcinolone ointment by dermatologist.  Return if symptoms worsen or fail to improve.  Almon Herculesaye T Elison Worrel, MD 01/08/17 Pager: [phone]

## 2017-01-09 ENCOUNTER — Other Ambulatory Visit: Payer: Self-pay | Admitting: Student

## 2017-01-09 ENCOUNTER — Telehealth: Payer: Self-pay | Admitting: *Deleted

## 2017-01-09 DIAGNOSIS — G43109 Migraine with aura, not intractable, without status migrainosus: Secondary | ICD-10-CM

## 2017-01-09 MED ORDER — SUMATRIPTAN SUCCINATE 50 MG PO TABS: 50.0000 mg | ORAL_TABLET | ORAL | 0 refills | Status: DC | PRN

## 2017-01-09 NOTE — Telephone Encounter (Signed)
Called and talked to patient.  She states that her migraine is still hurting.  Migraine cocktail in clinic to help a little bit yesterday.  She has been taking Norco and Aleve liquid gel without improvement.  She does state that she has tried Imitrex in the past.  She reports some improvement with that in the past. I sent a prescription for Imitrex 50 mg, #10 to her pharmacy. I advised her to call the clinic in the morning if no improvement in headache.  Patient voiced understanding.  She appreciated the call.

## 2017-01-09 NOTE — Telephone Encounter (Signed)
Patient left message on nurse line requesting return call from PCP. Continues with migraine and nausea even after taking Vicodin and 3 injections yesterday. Patient requests call to husband, Homero FellersFrank at [phone] to discuss. Kinnie FeilL. Elonna Mcfarlane, RN, BSN

## 2017-01-25 ENCOUNTER — Other Ambulatory Visit: Payer: Self-pay | Admitting: Student

## 2017-01-25 DIAGNOSIS — K219 Gastro-esophageal reflux disease without esophagitis: Secondary | ICD-10-CM

## 2017-01-27 ENCOUNTER — Telehealth: Payer: Self-pay | Admitting: Student

## 2017-01-27 NOTE — Telephone Encounter (Signed)
FMLA form dropped off for at front desk for completion.  Verified that patient section of form has been completed.  Last DOS/WCC with PCP was 12/16/16.  Placed form in team folder to be completed by clinical staff.  Chari ManningLynette D Sells

## 2017-01-28 NOTE — Telephone Encounter (Signed)
Clinical info completed on FMLA form.  Place form in Dr. Gonfa's box for completion.  Zimmerman Rumple, Makita Blow D, CMA  

## 2017-01-29 ENCOUNTER — Encounter: Payer: Self-pay | Admitting: Student

## 2017-01-29 NOTE — Telephone Encounter (Signed)
LVM for pt to call back to inform her that her forms were completed and they were faxed and a copy will be up front for her to pick up as well. Lamonte SakaiZimmerman Rumple, April D, [place]

## 2017-01-29 NOTE — Telephone Encounter (Signed)
Intermittent FMLA form completed, signed and return to pool RN.  Note routed to pool RN team inbasket.  Almon Herculesaye T Hazelgrace Bonham, MD

## 2017-01-30 ENCOUNTER — Encounter: Payer: Self-pay | Admitting: Student

## 2017-02-02 ENCOUNTER — Other Ambulatory Visit: Payer: Self-pay | Admitting: Student

## 2017-02-02 DIAGNOSIS — E559 Vitamin D deficiency, unspecified: Secondary | ICD-10-CM

## 2017-02-03 ENCOUNTER — Encounter: Payer: Self-pay | Admitting: Student

## 2017-02-03 MED ORDER — VITAMIN D 50 MCG (2000 UT) PO CAPS: 2000.0000 [IU] | ORAL_CAPSULE | Freq: Every day | ORAL | 3 refills | Status: DC

## 2017-02-05 ENCOUNTER — Encounter: Payer: Self-pay | Admitting: Student

## 2017-02-10 ENCOUNTER — Other Ambulatory Visit: Payer: Self-pay | Admitting: Student

## 2017-02-10 DIAGNOSIS — I10 Essential (primary) hypertension: Secondary | ICD-10-CM

## 2017-02-10 MED ORDER — QUINAPRIL-HYDROCHLOROTHIAZIDE 20-12.5 MG PO TABS: 1.0000 | ORAL_TABLET | Freq: Every day | ORAL | 3 refills | Status: DC

## 2017-02-10 NOTE — Progress Notes (Signed)
Sent a refill for quinapril/HCTZ to [facility].  Advised patient to call her dermatologist for steroid and ketoconazole cream

## 2017-02-11 ENCOUNTER — Other Ambulatory Visit: Payer: Self-pay | Admitting: Student

## 2017-02-11 DIAGNOSIS — G8929 Other chronic pain: Secondary | ICD-10-CM

## 2017-02-11 DIAGNOSIS — M170 Bilateral primary osteoarthritis of knee: Secondary | ICD-10-CM

## 2017-02-14 NOTE — Telephone Encounter (Signed)
Pt called again about her refill on her norco.

## 2017-02-14 NOTE — Telephone Encounter (Signed)
Pt needs refill on her norco.  Please call when ready for pickup

## 2017-02-18 ENCOUNTER — Encounter: Payer: Self-pay | Admitting: Student

## 2017-02-18 MED ORDER — HYDROCODONE-ACETAMINOPHEN 5-325 MG PO TABS: 1.0000 | ORAL_TABLET | Freq: Three times a day (TID) | ORAL | 0 refills | Status: DC | PRN

## 2017-02-19 ENCOUNTER — Other Ambulatory Visit: Payer: Self-pay | Admitting: Student

## 2017-02-19 ENCOUNTER — Telehealth: Payer: Self-pay | Admitting: Student

## 2017-02-19 ENCOUNTER — Encounter: Payer: Self-pay | Admitting: Student

## 2017-02-19 DIAGNOSIS — G43109 Migraine with aura, not intractable, without status migrainosus: Secondary | ICD-10-CM

## 2017-02-19 MED ORDER — TOPIRAMATE ER 25 MG PO CAP24: ORAL_CAPSULE | ORAL | 0 refills | Status: DC

## 2017-02-19 NOTE — Telephone Encounter (Signed)
Pt called and said [facility] faxed over something for Dr Alanda SlimGonfa to verify the intructions on this medication. They will not fill it for the patient until this is done and she is waiting at [facility]. Please advise

## 2017-02-19 NOTE — Telephone Encounter (Signed)
Called and talked to patient about a migraine headache.  Discussed about prophylactic options including antidepressants, beta blocker and topiramate. I do not think she would tolerate the beta-blocker given her blood pressure.  Topiramate might be a good option given her obesity.  So I sent a prescription to her pharmacy.  She would be starting at 25 mg nightly.  She will increase by 25 mg weekly to the maximum dose of 100 mg nightly.

## 2017-02-19 NOTE — Telephone Encounter (Signed)
Sent new Rx with new direction

## 2017-02-20 ENCOUNTER — Other Ambulatory Visit: Payer: Self-pay | Admitting: Student

## 2017-02-20 DIAGNOSIS — G43109 Migraine with aura, not intractable, without status migrainosus: Secondary | ICD-10-CM

## 2017-02-20 MED ORDER — TOPIRAMATE 25 MG PO TABS: ORAL_TABLET | ORAL | 0 refills | Status: DC

## 2017-02-21 ENCOUNTER — Other Ambulatory Visit: Payer: Self-pay | Admitting: Student

## 2017-02-21 DIAGNOSIS — G43109 Migraine with aura, not intractable, without status migrainosus: Secondary | ICD-10-CM

## 2017-02-21 MED ORDER — AMITRIPTYLINE HCL 10 MG PO TABS: 10.0000 mg | ORAL_TABLET | Freq: Every day | ORAL | 0 refills | Status: DC

## 2017-02-25 ENCOUNTER — Encounter: Payer: Self-pay | Admitting: Student

## 2017-02-26 ENCOUNTER — Other Ambulatory Visit: Payer: Self-pay | Admitting: Student

## 2017-02-26 DIAGNOSIS — I10 Essential (primary) hypertension: Secondary | ICD-10-CM

## 2017-02-26 DIAGNOSIS — R110 Nausea: Secondary | ICD-10-CM

## 2017-02-26 DIAGNOSIS — R600 Localized edema: Secondary | ICD-10-CM

## 2017-02-26 DIAGNOSIS — I872 Venous insufficiency (chronic) (peripheral): Secondary | ICD-10-CM

## 2017-02-26 DIAGNOSIS — E7800 Pure hypercholesterolemia, unspecified: Secondary | ICD-10-CM

## 2017-02-26 DIAGNOSIS — K219 Gastro-esophageal reflux disease without esophagitis: Secondary | ICD-10-CM

## 2017-02-26 DIAGNOSIS — G43109 Migraine with aura, not intractable, without status migrainosus: Secondary | ICD-10-CM

## 2017-02-27 ENCOUNTER — Encounter: Payer: Self-pay | Admitting: Student

## 2017-02-28 ENCOUNTER — Telehealth: Payer: Self-pay | Admitting: Student

## 2017-02-28 MED ORDER — FUROSEMIDE 40 MG PO TABS: 40.0000 mg | ORAL_TABLET | Freq: Every day | ORAL | 3 refills | Status: DC | PRN

## 2017-02-28 MED ORDER — PROMETHAZINE HCL 25 MG PO TABS: 25.0000 mg | ORAL_TABLET | Freq: Three times a day (TID) | ORAL | 3 refills | Status: DC | PRN

## 2017-02-28 MED ORDER — ATORVASTATIN CALCIUM 40 MG PO TABS: 40.0000 mg | ORAL_TABLET | Freq: Every evening | ORAL | 3 refills | Status: DC

## 2017-02-28 MED ORDER — AMITRIPTYLINE HCL 10 MG PO TABS: 10.0000 mg | ORAL_TABLET | Freq: Every day | ORAL | 0 refills | Status: DC

## 2017-02-28 MED ORDER — SUMATRIPTAN SUCCINATE 50 MG PO TABS: ORAL_TABLET | ORAL | 3 refills | Status: DC

## 2017-02-28 MED ORDER — QUINAPRIL-HYDROCHLOROTHIAZIDE 20-12.5 MG PO TABS: 1.0000 | ORAL_TABLET | Freq: Every day | ORAL | 3 refills | Status: DC

## 2017-02-28 NOTE — Telephone Encounter (Signed)
Not sure what they mean. I have sent her refills again

## 2017-02-28 NOTE — Telephone Encounter (Signed)
Pt says [facility] contacted them and said they will not refill any of the medications Gonfa sent to them because the Rx did not have dosages or instructions on the meds. Please advise

## 2017-03-07 ENCOUNTER — Other Ambulatory Visit: Payer: Self-pay

## 2017-03-07 ENCOUNTER — Emergency Department (HOSPITAL_BASED_OUTPATIENT_CLINIC_OR_DEPARTMENT_OTHER)
Admission: EM | Admit: 2017-03-07 | Discharge: 2017-03-07 | Disposition: A | Discharge status: Home / Self Care | Payer: Managed Care, Other (non HMO) | Attending: Emergency Medicine | Admitting: Emergency Medicine

## 2017-03-07 ENCOUNTER — Encounter (HOSPITAL_BASED_OUTPATIENT_CLINIC_OR_DEPARTMENT_OTHER): Payer: Self-pay | Admitting: *Deleted

## 2017-03-07 ENCOUNTER — Other Ambulatory Visit: Payer: Self-pay | Admitting: Student

## 2017-03-07 DIAGNOSIS — Z7982 Long term (current) use of aspirin: Secondary | ICD-10-CM | POA: Insufficient documentation

## 2017-03-07 DIAGNOSIS — G43109 Migraine with aura, not intractable, without status migrainosus: Secondary | ICD-10-CM

## 2017-03-07 DIAGNOSIS — Z79899 Other long term (current) drug therapy: Secondary | ICD-10-CM | POA: Diagnosis not present

## 2017-03-07 DIAGNOSIS — R51 Headache: Secondary | ICD-10-CM | POA: Diagnosis present

## 2017-03-07 DIAGNOSIS — K0889 Other specified disorders of teeth and supporting structures: Secondary | ICD-10-CM | POA: Insufficient documentation

## 2017-03-07 DIAGNOSIS — J45909 Unspecified asthma, uncomplicated: Secondary | ICD-10-CM | POA: Diagnosis not present

## 2017-03-07 DIAGNOSIS — K219 Gastro-esophageal reflux disease without esophagitis: Secondary | ICD-10-CM

## 2017-03-07 DIAGNOSIS — J4530 Mild persistent asthma, uncomplicated: Secondary | ICD-10-CM

## 2017-03-07 MED ORDER — PENICILLIN V POTASSIUM 500 MG PO TABS: 500.0000 mg | ORAL_TABLET | Freq: Four times a day (QID) | ORAL | 0 refills | Status: DC

## 2017-03-07 MED ORDER — ALBUTEROL SULFATE HFA 108 (90 BASE) MCG/ACT IN AERS: 2.0000 | INHALATION_SPRAY | RESPIRATORY_TRACT | 3 refills | Status: DC | PRN

## 2017-03-07 MED ORDER — ESOMEPRAZOLE MAGNESIUM 20 MG PO CPDR: 20.0000 mg | DELAYED_RELEASE_CAPSULE | Freq: Every day | ORAL | 0 refills | Status: DC

## 2017-03-07 MED ORDER — TOPIRAMATE 25 MG PO TABS: 25.0000 mg | ORAL_TABLET | Freq: Every day | ORAL | 0 refills | Status: DC

## 2017-03-07 NOTE — ED Triage Notes (Signed)
Pt has a tooth in her mouth that hurts. She said that it is causing swelling in her face that is not seen by nurse. She stated that she had rheumatic fever as a child so she knows she needs antibiotics.

## 2017-03-07 NOTE — ED Notes (Signed)
NAD at this time. Pt is stable and going home.  

## 2017-03-11 NOTE — ED Provider Notes (Signed)
[facility] EMERGENCY DEPARTMENT Provider Note   CSN: [id number] Arrival date & time: 03/07/17  7846     History   Chief Complaint Chief Complaint  Patient presents with  . Dental Pain    HPI Catherine Powell is a 52 y.o. female.  HPI   52 year old female with right facial pain and swelling.  She is concerned that she may have a dental abscess.  Symptoms worse over the past 3 days.  Tolerable with over-the-counter pain medication but she thinks she needs antibiotics.  No fevers or chills.  Past Medical History:  Diagnosis Date  . Allergic rhinitis   . ASTHMA, PERSISTENT, MILD 05/08/2006   Qualifier: Diagnosis of  By: Lanier Prude  MD, Cathrine Muster    . Carpal tunnel syndrome 05/08/2006  . Degenerative joint disease of knees 05/01/2010   Qualifier: Diagnosis of  By: Christella Hartigan MD, [name]    . Depression 12/16/2006   Qualifier: Diagnosis of  By: Irving Burton MD, [name]    . ESSENTIAL HYPERTENSION 05/22/2006   Qualifier: Diagnosis of  By: Irving Burton MD, [name]    . GASTROESOPHAGEAL REFLUX, NO ESOPHAGITIS 05/08/2006   Qualifier: Diagnosis of  By: Irving Burton MD, [name]    . Heart murmur    slight due to Rheumatic fever had cardiac studies 2009 Dr. Antoine Poche  . HYPERCHOLESTEROLEMIA 05/08/2006   Qualifier: Diagnosis of  By: Irving Burton MD, [name]    . MIGRAINE NOS W/INTRACTABLE MIGRAINE 11/19/2006   Has been seen by [facility] but currently cannot afford to go back.  Has been tried on multiple medications to minimal effect.  Hydrocodone appears to be the only medication that can abort headaches.    . Rheumatic fever   . Right knee meniscal tear    and loose body  . SINUSITIS, CHRONIC, NOS 05/08/2006  . Spinal headache     Patient Active Problem List   Diagnosis Date Noted  . Cellulitis of both lower extremities 12/16/2016  . Candidiasis, intertrigo 12/05/2016  . Acute medial meniscus tear of right knee 06/15/2016  . Dental abscess 12/27/2015  . Chronic venous insufficiency 06/27/2015  .  Rash and nonspecific skin eruption 01/23/2015  . Healthcare maintenance 12/22/2014  . Vitamin D deficiency 05/11/2014  . Encounter for chronic pain management 05/11/2014  . Degenerative joint disease of knees 05/01/2010  . Cervical radiculopathy 08/06/2007  . Migraine 11/19/2006  . Essential hypertension 05/22/2006  . HYPERCHOLESTEROLEMIA 05/08/2006  . OBESITY, NOS 05/08/2006  . Allergic rhinitis 05/08/2006  . Asthma, chronic 05/08/2006  . GASTROESOPHAGEAL REFLUX, NO ESOPHAGITIS 05/08/2006    Past Surgical History:  Procedure Laterality Date  . APPENDECTOMY    . CARDIAC CATHETERIZATION     2009 Dr. Antoine Poche  . CERVICAL FUSION    . CHOLECYSTECTOMY    . KNEE ARTHROSCOPY Right 06/17/2016   Procedure: RIGHT KNEE ARTHROSCOPY;  Surgeon: Gean Birchwood, MD;  Location: [facility] OR;  Service: Orthopedics;  Laterality: Right;  . WISDOM TOOTH EXTRACTION      OB History    No data available       Home Medications    Prior to Admission medications   Medication Sig Start Date End Date Taking? Authorizing Provider  albuterol (VENTOLIN HFA) 108 (90 Base) MCG/ACT inhaler Inhale 2 puffs into the lungs every 4 (four) hours as needed for wheezing. 03/07/17   Almon Hercules, MD  amitriptyline (ELAVIL) 10 MG tablet Take 1 tablet (10 mg total) by mouth at bedtime. May increase to two tablets at bedtime after one week if  no improvement. 02/28/17   Almon Hercules, MD  aspirin-acetaminophen-caffeine (EXCEDRIN MIGRAINE) [phone] MG tablet Take 2 tablets by mouth every 6 (six) hours as needed for headache.    [provider]  atorvastatin (LIPITOR) 40 MG tablet Take 1 tablet (40 mg total) by mouth every evening. 02/28/17   Almon Hercules, MD  budesonide-formoterol (SYMBICORT) 160-4.5 MCG/ACT inhaler Inhale 1 puff into the lungs 2 (two) times daily. Patient taking differently: Inhale 1 puff into the lungs 2 (two) times daily as needed (allergies).  04/28/14   Tyrone Nine, MD  Cholecalciferol (VITAMIN D)  2000 units CAPS Take 1 capsule (2,000 Units total) by mouth daily. 02/03/17   Almon Hercules, MD  co-enzyme Q-10 30 MG capsule Take 30 mg by mouth 2 (two) times daily.    [provider]  esomeprazole (NEXIUM) 20 MG capsule Take 1 capsule (20 mg total) by mouth daily at 12 noon. 03/07/17   Almon Hercules, MD  fluconazole (DIFLUCAN) 150 MG tablet Take 1 tablet (150 mg total) by mouth daily. 12/16/16   Tillman Sers, DO  furosemide (LASIX) 40 MG tablet Take 1 tablet (40 mg total) by mouth daily as needed for fluid. 02/28/17   Almon Hercules, MD  HYDROcodone-acetaminophen (NORCO) 5-325 MG tablet Take 1 tablet by mouth every 8 (eight) hours as needed for moderate pain. 02/18/17   Almon Hercules, MD  HYDROcodone-acetaminophen (NORCO) 5-325 MG tablet Take 1 tablet by mouth every 8 (eight) hours as needed for moderate pain. 02/18/17   Almon Hercules, MD  HYDROcodone-acetaminophen (NORCO) 5-325 MG tablet Take 1 tablet by mouth every 8 (eight) hours as needed for moderate pain. 02/18/17   Almon Hercules, MD  Melatonin 10 MG TABS Take 10 mg by mouth at bedtime.     [provider]  montelukast (SINGULAIR) 10 MG tablet Take 1 tablet (10 mg total) by mouth at bedtime. Patient taking differently: Take 10 mg by mouth at bedtime as needed (allergies).  08/18/14   Tyrone Nine, MD  Multiple Vitamins-Minerals (MULTIVITAMIN WOMEN 50+) TABS Take 1 tablet by mouth daily. 12/10/16   Almon Hercules, MD  olopatadine (PATANOL) 0.1 % ophthalmic solution Place 1 drop into both eyes 2 (two) times daily. Patient taking differently: Place 1 drop into both eyes 2 (two) times daily as needed for allergies.  08/18/14   Tyrone Nine, MD  penicillin v potassium (VEETID) 500 MG tablet Take 1 tablet (500 mg total) by mouth 4 (four) times daily for 7 days. 03/07/17 03/14/17  Raeford Razor, MD  promethazine (PHENERGAN) 25 MG tablet Take 1 tablet (25 mg total) by mouth every 8 (eight) hours as needed for nausea or vomiting.  02/28/17 08/20/18  Almon Hercules, MD  quinapril-hydrochlorothiazide (ACCURETIC) 20-12.5 MG tablet Take 1 tablet by mouth daily. 02/28/17   Almon Hercules, MD  Soap & Cleansers (AQUA GLYCOLIC SHAMPOO/BODY) LIQD Apply 1 application topically daily. 12/16/16   Tillman Sers, DO  sodium chloride (OCEAN) 0.65 % SOLN nasal spray Place 1 spray into both nostrils as needed for congestion.    [provider]  SUMAtriptan (IMITREX) 50 MG tablet Take one tablet once. May repeat after 2 hours if needed. Maximum 4 tablets in 24 hours. 02/28/17   Almon Hercules, MD  topiramate (TOPAMAX) 25 MG tablet Take 1 tablet (25 mg total) by mouth at bedtime. 03/07/17   Almon Hercules, MD  triamcinolone ointment (KENALOG) 0.5 % Apply  1 application topically 2 (two) times daily. 12/16/16   Tillman Sersiccio, Angela C, DO    Family History Family History  Problem Relation Age of Onset  . Migraines Mother     Social History Social History   Tobacco Use  . Smoking status: Never Smoker  . Smokeless tobacco: Never Used  Substance Use Topics  . Alcohol use: Yes    Comment: rare  . Drug use: No     Allergies   Tolectin ds [tolmetin sodium]   Review of Systems Review of Systems  All systems reviewed and negative, other than as noted in HPI.  Physical Exam Updated Vital Signs BP (!) 178/87 (BP Location: Right Arm)   Pulse 74   Temp (!) 97.4 F (36.3 C) (Oral)   Resp (!) 22   Ht 5' 8.5" (1.74 m)   Wt (!) 180.3 kg (397 lb 8 oz)   LMP 05/15/2012   SpO2 100%   BMI 59.56 kg/m   Physical Exam  Constitutional: She appears well-developed and well-nourished. No distress.  HENT:  Head: Normocephalic and atraumatic.  Cannot say I appreciate the swelling in her face that she is complaining of.  Her right upper canine is cracked at the base though and she has overall very poor dentition.  There is no discrete drainable collection.    Eyes: Conjunctivae are normal. Right eye exhibits no discharge. Left eye exhibits  no discharge.  Neck: Neck supple.  Cardiovascular: Normal rate, regular rhythm and normal heart sounds. Exam reveals no gallop and no friction rub.  No murmur heard. Pulmonary/Chest: Effort normal and breath sounds normal. No respiratory distress.  Abdominal: Soft. She exhibits no distension. There is no tenderness.  Musculoskeletal: She exhibits no edema or tenderness.  Neurological: She is alert.  Skin: Skin is warm and dry.  Psychiatric: She has a normal mood and affect. Her behavior is normal. Thought content normal.  Nursing note and vitals reviewed.    ED Treatments / Results  Labs (all labs ordered are listed, but only abnormal results are displayed) Labs Reviewed - No data to display  EKG  EKG Interpretation None       Radiology No results found.  Procedures Procedures (including critical care time)  Medications Ordered in ED Medications - No data to display   Initial Impression / Assessment and Plan / ED Course  I have reviewed the triage vital signs and the nursing notes.  Pertinent labs & imaging results that were available during my care of the patient were reviewed by me and considered in my medical decision making (see chart for details).     Plan continued NSAIDs.  Given a course of antibiotics.  Discussed that she needs definitive dental follow-up.  Final Clinical Impressions(s) / ED Diagnoses   Final diagnoses:  Pain, dental    ED Discharge Orders        Ordered    penicillin v potassium (VEETID) 500 MG tablet  4 times daily     03/07/17 0819       Raeford RazorKohut, Fuller Makin, MD 03/11/17 1735

## 2017-03-12 ENCOUNTER — Other Ambulatory Visit: Payer: Self-pay | Admitting: Student

## 2017-03-12 DIAGNOSIS — M170 Bilateral primary osteoarthritis of knee: Secondary | ICD-10-CM

## 2017-03-12 MED ORDER — MELOXICAM 15 MG PO TABS: 15.0000 mg | ORAL_TABLET | Freq: Every day | ORAL | 0 refills | Status: DC

## 2017-03-13 ENCOUNTER — Encounter: Payer: Self-pay | Admitting: Student

## 2017-03-13 ENCOUNTER — Ambulatory Visit (INDEPENDENT_AMBULATORY_CARE_PROVIDER_SITE_OTHER): Payer: Managed Care, Other (non HMO) | Admitting: Student

## 2017-03-13 ENCOUNTER — Other Ambulatory Visit: Payer: Self-pay

## 2017-03-13 VITALS — BP 128/82 | HR 72 | Temp 98.6°F | Ht 68.5 in | Wt >= 6400 oz

## 2017-03-13 DIAGNOSIS — B373 Candidiasis of vulva and vagina: Secondary | ICD-10-CM

## 2017-03-13 DIAGNOSIS — K047 Periapical abscess without sinus: Secondary | ICD-10-CM | POA: Diagnosis not present

## 2017-03-13 DIAGNOSIS — B3731 Acute candidiasis of vulva and vagina: Secondary | ICD-10-CM

## 2017-03-13 MED ORDER — PENICILLIN V POTASSIUM 500 MG PO TABS: 500.0000 mg | ORAL_TABLET | Freq: Four times a day (QID) | ORAL | 0 refills | Status: AC

## 2017-03-13 MED ORDER — PENICILLIN V POTASSIUM 500 MG PO TABS: 500.0000 mg | ORAL_TABLET | Freq: Four times a day (QID) | ORAL | 0 refills | Status: DC

## 2017-03-13 MED ORDER — FLUCONAZOLE 150 MG PO TABS: 150.0000 mg | ORAL_TABLET | Freq: Once | ORAL | 2 refills | Status: AC

## 2017-03-13 NOTE — Patient Instructions (Addendum)
It was great seeing you today! We have addressed the following issues today  Toothache/infection: I have refilled your penicillin for 5 more days.  Please call your dentist early in the morning.  We also gave you a prescription for Diflucan for yeast infection.  Take Care,   Dr. Alanda SlimGonfa

## 2017-03-13 NOTE — Progress Notes (Signed)
Subjective:    Catherine Powell is a 52 y.o. old female here for ED follow-up on dental infection and yeast infection  HPI Tooth ache: this has been going on for one week. She went to ED about a week ago and was put on penicillin V 500 mg 4 times daily that she has taken for 7 days.  Last dose was yesterday.  She reports significant improvement with this.  But she continues to have some pain and swelling in his right upper jaw.  Denies drainage or fever.  She was advised to follow-up with a dentist but has not scheduled this yet.   Yeast infection: Reports some whitish discharge with pruritus after starting penicillin V.  No dysuria or fever.  PMH/Problem List: has HYPERCHOLESTEROLEMIA; OBESITY, NOS; Migraine; Essential hypertension; Allergic rhinitis; Asthma, chronic; GASTROESOPHAGEAL REFLUX, NO ESOPHAGITIS; Cervical radiculopathy; Degenerative joint disease of knees; Vitamin D deficiency; Encounter for chronic pain management; Healthcare maintenance; Rash and nonspecific skin eruption; Chronic venous insufficiency; Dental abscess; Acute medial meniscus tear of right knee; Candidiasis, intertrigo; and Cellulitis of both lower extremities on their problem list.   has a past medical history of Allergic rhinitis, ASTHMA, PERSISTENT, MILD (05/08/2006), Carpal tunnel syndrome (05/08/2006), Degenerative joint disease of knees (05/01/2010), Depression (12/16/2006), ESSENTIAL HYPERTENSION (05/22/2006), GASTROESOPHAGEAL REFLUX, NO ESOPHAGITIS (05/08/2006), Heart murmur, HYPERCHOLESTEROLEMIA (05/08/2006), MIGRAINE NOS W/INTRACTABLE MIGRAINE (11/19/2006), Rheumatic fever, Right knee meniscal tear, SINUSITIS, CHRONIC, NOS (05/08/2006), and Spinal headache.  FH:  Family History  Problem Relation Age of Onset  . Migraines Mother     SH Social History   Tobacco Use  . Smoking status: Never Smoker  . Smokeless tobacco: Never Used  Substance Use Topics  . Alcohol use: Yes    Comment: rare  . Drug use: No    Review  of Systems Review of systems negative except for pertinent positives and negatives in history of present illness above.     Objective:     Vitals:   03/13/17 1440  BP: 128/82  Pulse: 72  Temp: 98.6 F (37 C)  TempSrc: Oral  SpO2: 97%  Weight: (!) 415 lb (188.2 kg)  Height: 5' 8.5" (1.74 m)   Body mass index is 62.18 kg/m.  Physical Exam  GEN: Morbidly obese, no apparent distress. Oropharynx: some erythema of her right upper gum, some dental caries, no notable swelling or abscess, negative transillumination test.  HEM: negative for cervical or periauricular lymphadenopathies CVS: RRR, nl s1 & s2, no murmurs, no edema RESP: no IWOB, good air movement bilaterally, CTAB MSK: Tenderness to palpation over right maxillary bone medially SKIN: no apparent skin lesion NEURO: alert and oiented appropriately, no gross deficits  PSYCH: euthymic mood with congruent affect    Assessment and Plan:  1. Dental infection/periodontitis: No notable abscess or fluid loculation.  Exam with some dental caries and erythema of right upper gum and tenderness to palpation over her right maxillary bone medially.  She has no constitutional symptoms.  Already completed 7 days course of penicillin V.  We will continue penicillin V for 5 more days.  I strongly recommended follow-up with a dentist as soon as possible. - penicillin v potassium (VEETID) 500 MG tablet; Take 1 tablet (500 mg total) by mouth 4 (four) times daily for 5 days.  Dispense: 20 tablet; Refill: 0  2. Vaginal candidiasis: Likely due to antibiotic use.  Gave her a prescription for Diflucan 150 mg #2 with 2 refills.  Return if symptoms worsen or fail to improve.  Boyce Medici  Alanda SlimGonfa, MD 03/13/17 Pager: [phone]

## 2017-03-26 ENCOUNTER — Encounter: Payer: Self-pay | Admitting: Student

## 2017-04-08 ENCOUNTER — Encounter: Payer: Self-pay | Admitting: Student

## 2017-04-29 ENCOUNTER — Ambulatory Visit (INDEPENDENT_AMBULATORY_CARE_PROVIDER_SITE_OTHER): Payer: Managed Care, Other (non HMO) | Admitting: Student

## 2017-04-29 ENCOUNTER — Encounter: Payer: Self-pay | Admitting: Student

## 2017-04-29 ENCOUNTER — Other Ambulatory Visit: Payer: Self-pay

## 2017-04-29 VITALS — BP 124/86 | HR 73 | Temp 98.2°F | Ht 68.0 in | Wt >= 6400 oz

## 2017-04-29 DIAGNOSIS — Z1231 Encounter for screening mammogram for malignant neoplasm of breast: Secondary | ICD-10-CM | POA: Diagnosis not present

## 2017-04-29 DIAGNOSIS — I890 Lymphedema, not elsewhere classified: Secondary | ICD-10-CM

## 2017-04-29 DIAGNOSIS — H04129 Dry eye syndrome of unspecified lacrimal gland: Secondary | ICD-10-CM | POA: Diagnosis not present

## 2017-04-29 DIAGNOSIS — M25572 Pain in left ankle and joints of left foot: Secondary | ICD-10-CM | POA: Diagnosis not present

## 2017-04-29 DIAGNOSIS — M25571 Pain in right ankle and joints of right foot: Secondary | ICD-10-CM | POA: Diagnosis not present

## 2017-04-29 DIAGNOSIS — I10 Essential (primary) hypertension: Secondary | ICD-10-CM | POA: Diagnosis not present

## 2017-04-29 DIAGNOSIS — G8929 Other chronic pain: Secondary | ICD-10-CM

## 2017-04-29 DIAGNOSIS — M170 Bilateral primary osteoarthritis of knee: Secondary | ICD-10-CM

## 2017-04-29 DIAGNOSIS — Z1239 Encounter for other screening for malignant neoplasm of breast: Secondary | ICD-10-CM

## 2017-04-29 DIAGNOSIS — E559 Vitamin D deficiency, unspecified: Secondary | ICD-10-CM

## 2017-04-29 MED ORDER — FISH OIL 435 MG PO CAPS: 1.0000 | ORAL_CAPSULE | Freq: Every day | ORAL | 11 refills | Status: AC

## 2017-04-29 MED ORDER — VITAMIN D-3 25 MCG (1000 UT) PO CAPS: 1.0000 | ORAL_CAPSULE | Freq: Every day | ORAL | 0 refills | Status: DC

## 2017-04-29 NOTE — Patient Instructions (Signed)
It was great seeing you today! We have addressed the following issues today  Leg swelling/edema: Continue using Lasix.  We also ordered a referral to lymphedema clinic.  I will feel you paperwork and send you a my chart message when it ready for pick up.   Colon cancer screening: Please call the number below to schedule for your colonoscopy  Breast cancer screening: Please call the number below to schedule for your mammogram  Low vitamin D: Recommend taking vitamin D 1000 units daily.  This is available over-the-counter.   If we did any lab work today, and the results require attention, either me or my nurse will get in touch with you. If everything is normal, you will get a letter in mail and a message via . If you don't hear from us in two weeks, please give us a call. Otherwise, we look forward to seeing you again at your next visit. If you have any questions or concerns before then, please call the clinic at [phone].  Please bring all your medications to every doctors visit  Sign up for My Chart to have easy access to your labs results, and communication with your Primary care physician.    Please check-out at the front desk before leaving the clinic.    Take Care,   Dr. Alanda SlimGonfa     Colon Cancer  People with early colon cancer usually have no warning signs or symptoms.  If found early, most patients can be cured, but if found when it has already spread, the chance of survival is not as good.  Colon cancer is the second most common cause of concern is in the [place] with over 56,000 deaths from colon cancer in 2005  Colon cancer is a common, treatable disease. Screening tests can find a cancer  early, before you have symptoms, and make it more likely that you will survive the disease.  Who needs to be tested? If you are age 54-75 yrs, you should be tested for colon cancer.  Ways to be tested:  A colonoscopy the best test to detect colon cancer. It requires you to drink a  bowel preparation to clean out your colon before the test. During this test, a tube with a camera inserted into your rectum and examines your entire colon. You can be given medicine to make you sleepy during the exam. Therefore, you will not be able to drive immediately after the test. There is a small risk of bowel injury during the test.   Stool cards that you can take home and take a sample of your stool is another option. The cards are not as good as colonoscopy at detecting cancer, but the tests are easier and cheaper.   To schedule the colonoscopy, you can call one of the 3 options below:  [facility]. Phone number: [phone]  [facility]. Phone number: [phone]  [facility]: Phone number [phone]

## 2017-04-29 NOTE — Progress Notes (Signed)
Subjective:    Catherine Powell is a 52 y.o. old female here to discuss leg and feet pain  HPI Leg and feet pain: this is a chronic issue for about a year.  Pain is multifactorial.  She has severe lymphedema, chronic osteoarthritis of ankles and knees and morbid obesity.  She had multiple right ankle surgery in the past.  Pain could be achy, sharp and burning.  Pain is worse with prolonged sitting and standing. She says the shoes she wear at work also aggravates her feet pain. She says her pain is better with tennis shoe. 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   Has been using Lasix for lymphedema.  She is also on hydrocodone for chronic pain.  She also reports increased frequency of urination due to Lasix.   PMH/Problem List: has HYPERCHOLESTEROLEMIA; OBESITY, NOS; Migraine; Essential hypertension; Allergic rhinitis; Asthma, chronic; GASTROESOPHAGEAL REFLUX, NO ESOPHAGITIS; Cervical radiculopathy; Degenerative joint disease of knees; Vitamin D deficiency; Encounter for chronic pain management; Healthcare maintenance; Rash and nonspecific skin eruption; Chronic venous insufficiency; Dental abscess; Acute medial meniscus tear of right knee; Candidiasis, intertrigo; Cellulitis of both lower extremities; and Lymphedema on their problem list.   has a past medical history of Allergic rhinitis, ASTHMA, PERSISTENT, MILD (05/08/2006), Carpal tunnel syndrome (05/08/2006), Degenerative joint disease of knees (05/01/2010), Depression (12/16/2006), ESSENTIAL HYPERTENSION (05/22/2006), GASTROESOPHAGEAL REFLUX, NO ESOPHAGITIS (05/08/2006), Heart murmur, HYPERCHOLESTEROLEMIA (05/08/2006), MIGRAINE NOS W/INTRACTABLE MIGRAINE (11/19/2006), Rheumatic fever, Right knee meniscal tear, SINUSITIS, CHRONIC, NOS (05/08/2006), and Spinal headache.  FH:  Family History  Problem Relation Age of Onset  . Migraines Mother     SH Social History   Tobacco Use  . Smoking status: Never Smoker  .  Smokeless tobacco: Never Used  Substance Use Topics  . Alcohol use: Yes    Comment: rare  . Drug use: No    Review of Systems Review of systems negative except for pertinent positives and negatives in history of present illness above.     Objective:     Vitals:   04/29/17 1453  BP: 124/86  Pulse: 73  Temp: 98.2 F (36.8 C)  TempSrc: Oral  SpO2: 99%  Weight: (!) 412 lb 12.8 oz (187.2 kg)  Height: 5\' 8"  (1.727 m)   Body mass index is 62.77 kg/m.  Physical Exam  GEN: Morbidly obese, no apparent distress. HEM: negative for cervical or periauricular lymphadenopathies CVS: RRR, nl s1 & s2, no murmurs, edema bilaterally RESP: no IWOB, good air movement bilaterally, CTAB MSK: Lymphedema of lower extremity bilaterally, dry scaly skin, fair range of motion in her knee and ankle, no erythema or increased warmth to touch.  Surgical scar over the posterior aspect of her right ankle. SKIN: Dry scaly skin over lower legs bilaterally NEURO: alert and oiented appropriately, no gross deficits  PSYCH: euthymic mood with congruent affect    Assessment and Plan:  1. Lymphedema: Chronic issue.  Exam with significant edema and dry scaly skin bilaterally.  No signs of cellulitis.  Continue Lasix.  Referral to lymphedema clinic.  We will check BMP today.  We will fill her paperwork for accomodation (frequent breaks from prolonged sitting and standing) and accomodation on comfortable foot wear.  2. Primary osteoarthritis of both knees: Chronic.  No acute changes. -We will refill her Norco.  3. Chronic pain of both ankles -We will manage her lymphedema as above.  Refill her Norco for pain.   4. Screening breast examination - MM Digital Screening;  Future  5. Vitamin D deficiency - VITAMIN D 25 Hydroxy (Vit-D Deficiency, Fractures)  6. Essential hypertension: Stable. - Basic metabolic panel  7. Dry eye - Omega-3 Fatty Acids (FISH OIL) 435 MG CAPS; Take 1 capsule (435 mg total) by mouth  daily.  Dispense: 60 each; Refill: 11  Return if symptoms worsen or fail to improve.  Almon Herculesaye T Graysen Woodyard, MD 04/30/17 Pager: [phone]

## 2017-04-30 ENCOUNTER — Encounter: Payer: Self-pay | Admitting: Student

## 2017-04-30 LAB — BASIC METABOLIC PANEL
BUN / CREAT RATIO: 15 (ref 9–23)
BUN: 11 mg/dL (ref 6–24)
CALCIUM: 8.9 mg/dL (ref 8.7–10.2)
CHLORIDE: 102 mmol/L (ref 96–106)
CO2: 28 mmol/L (ref 20–29)
Creatinine, Ser: 0.74 mg/dL (ref 0.57–1.00)
GFR calc non Af Amer: 93 mL/min/{1.73_m2} (ref >59)
GFR, EST AFRICAN AMERICAN: 107 mL/min/{1.73_m2} (ref >59)
GLUCOSE: 95 mg/dL (ref 65–99)
POTASSIUM: 4.3 mmol/L (ref 3.5–5.2)
Sodium: 142 mmol/L (ref 134–144)

## 2017-04-30 LAB — VITAMIN D 25 HYDROXY (VIT D DEFICIENCY, FRACTURES): Vit D, 25-Hydroxy: 12.9 ng/mL — ABNORMAL LOW (ref 30.0–100.0)

## 2017-05-01 ENCOUNTER — Other Ambulatory Visit: Payer: Self-pay | Admitting: Student

## 2017-05-01 DIAGNOSIS — E559 Vitamin D deficiency, unspecified: Secondary | ICD-10-CM

## 2017-05-01 MED ORDER — VITAMIN D (ERGOCALCIFEROL) 1.25 MG (50000 UNIT) PO CAPS: 50000.0000 [IU] | ORAL_CAPSULE | ORAL | 0 refills | Status: DC

## 2017-05-06 ENCOUNTER — Ambulatory Visit: Payer: Managed Care, Other (non HMO)

## 2017-05-08 ENCOUNTER — Ambulatory Visit: Payer: Managed Care, Other (non HMO) | Admitting: Student

## 2017-05-20 ENCOUNTER — Encounter: Payer: Self-pay | Admitting: Student

## 2017-05-21 ENCOUNTER — Encounter: Payer: Self-pay | Admitting: Student

## 2017-05-21 ENCOUNTER — Other Ambulatory Visit: Payer: Self-pay | Admitting: Student

## 2017-05-21 DIAGNOSIS — M170 Bilateral primary osteoarthritis of knee: Secondary | ICD-10-CM

## 2017-05-21 DIAGNOSIS — G8929 Other chronic pain: Secondary | ICD-10-CM

## 2017-05-21 DIAGNOSIS — K219 Gastro-esophageal reflux disease without esophagitis: Secondary | ICD-10-CM

## 2017-05-22 ENCOUNTER — Other Ambulatory Visit: Payer: Self-pay | Admitting: Student

## 2017-05-22 DIAGNOSIS — M170 Bilateral primary osteoarthritis of knee: Secondary | ICD-10-CM

## 2017-05-22 DIAGNOSIS — G8929 Other chronic pain: Secondary | ICD-10-CM

## 2017-05-22 MED ORDER — HYDROCODONE-ACETAMINOPHEN 5-325 MG PO TABS: 1.0000 | ORAL_TABLET | Freq: Three times a day (TID) | ORAL | 0 refills | Status: DC | PRN

## 2017-05-22 NOTE — Progress Notes (Signed)
Rx for Norco sent to [facility] as requested by patient.

## 2017-05-29 ENCOUNTER — Other Ambulatory Visit: Payer: Self-pay | Admitting: Student

## 2017-05-29 DIAGNOSIS — G43109 Migraine with aura, not intractable, without status migrainosus: Secondary | ICD-10-CM

## 2017-05-29 DIAGNOSIS — M170 Bilateral primary osteoarthritis of knee: Secondary | ICD-10-CM

## 2017-06-05 ENCOUNTER — Ambulatory Visit: Payer: Managed Care, Other (non HMO)

## 2017-06-06 ENCOUNTER — Other Ambulatory Visit: Payer: Self-pay

## 2017-06-06 ENCOUNTER — Ambulatory Visit: Payer: Managed Care, Other (non HMO) | Admitting: Internal Medicine

## 2017-06-06 ENCOUNTER — Ambulatory Visit (INDEPENDENT_AMBULATORY_CARE_PROVIDER_SITE_OTHER): Payer: Managed Care, Other (non HMO) | Admitting: Family Medicine

## 2017-06-06 VITALS — BP 140/79 | HR 71 | Temp 97.8°F | Ht 68.0 in | Wt >= 6400 oz

## 2017-06-06 DIAGNOSIS — G43109 Migraine with aura, not intractable, without status migrainosus: Secondary | ICD-10-CM | POA: Diagnosis not present

## 2017-06-06 MED ORDER — DEXAMETHASONE SODIUM PHOSPHATE 10 MG/ML IJ SOLN
10.0000 mg | Freq: Once | INTRAMUSCULAR | Status: AC
  Administered 2017-06-06: 10 mg via INTRAMUSCULAR

## 2017-06-06 MED ORDER — KETOROLAC TROMETHAMINE 30 MG/ML IJ SOLN
30.0000 mg | Freq: Once | INTRAMUSCULAR | Status: AC
  Administered 2017-06-06: 30 mg via INTRAMUSCULAR

## 2017-06-06 MED ORDER — PROMETHAZINE HCL 25 MG/ML IJ SOLN
25.0000 mg | Freq: Once | INTRAMUSCULAR | Status: AC
  Administered 2017-06-06: 25 mg via INTRAMUSCULAR

## 2017-06-06 NOTE — Progress Notes (Signed)
error 

## 2017-06-06 NOTE — Patient Instructions (Addendum)
Giving you medications today for migraine Writing out of work through Monday.  Follow up if not getting better with these medications. If worsening or any numbness/tingling/weakness, fevers etc please go to ED.   Be well, Dr. Pollie MeyerMcIntyre

## 2017-06-06 NOTE — Progress Notes (Signed)
Date of Visit: 06/06/2017   HPI:  Patient presents for a same day appointment to discuss severe headache pain which started Tuesday evening, preceded in the morning by visual aura. She describes the pain as a skull cap pressing down on the top of her head which began at a "10.5/10" in severity and states that the pain feels almost identical to previous migraines she has experienced in the past. She has tried taking sumatriptan and Norco which reduced her pain down to a 7-8/10. She endorses nausea, one episode of emesis in the car this morning, photophobia and phonophobia. She denies any loss of strength or sensation. No fevers or neck stiffness.  ROS: See HPI  PMFSH: History of hyperlipidemia, chornic pain, hypertension, vit D deficiency, cervical radiculopathy, asthma, migraine   PHYSICAL EXAM: BP 140/79 (BP Location: Right Arm, Patient Position: Sitting, Cuff Size: Large)   Pulse 71   Temp 97.8 F (36.6 C) (Oral)   Ht 5\' 8"  (1.727 m)   Wt (!) 405 lb 1.6 oz (183.8 kg)   LMP 05/15/2012   PF 97 L/min   BMI 61.60 kg/m  Gen: Sitting with head down on table with sunglasses on, in no acute distress but appears in pain. HEENT: Pupils equal and round, extraocular movements intact.  Neuro: Pupils equal and round, extraocular movements intact. Full grip strength bilaterally. Sensation intact over bilateral face. SCM 5/5 bilaterally. No nuchal rigidity. Sensation intact over legs and arms. Speech normal. No facial droop.  ASSESSMENT/PLAN:  1. Migraine with aura  Given the similarity in patient's symptoms to previous migraine episodes, including nausea, photophobia, and phonophobia, and lack of fever, neck stiffness, and sensory/motor/focal neurologic deficits, patient's headache is most likely due to a migraine. - Given toradol 30 mg IM, Phenergan 25 mg IM, and Decadron 10 mg in office to relieve acute symptoms - Advised not to take Mobic for at least 24 hours due to similarity of medicine to  toradol - Advised not to drive home due to sedating effects of Phenergan - husband drove her here and will drive her home - Note provided to excuse from work for 3/26 through 3/31 - follow up if symptoms not improving with acute treatments.  Note written by Gracy BruinsMichelle Ikoma, MS3  Patient seen along with MS3 student Gracy BruinsMichelle Ikoma. I personally evaluated this patient along with the student, and verified all aspects of the history, physical exam, and medical decision making as documented by the student. I agree with the student's documentation and have made all necessary edits.   Levert FeinsteinBrittany Coreen Shippee, MD [facility]

## 2017-06-12 ENCOUNTER — Other Ambulatory Visit: Payer: Self-pay | Admitting: Student

## 2017-06-12 DIAGNOSIS — E559 Vitamin D deficiency, unspecified: Secondary | ICD-10-CM

## 2017-06-16 ENCOUNTER — Other Ambulatory Visit: Payer: Self-pay | Admitting: Student

## 2017-06-16 DIAGNOSIS — G8929 Other chronic pain: Secondary | ICD-10-CM

## 2017-06-16 DIAGNOSIS — M170 Bilateral primary osteoarthritis of knee: Secondary | ICD-10-CM

## 2017-06-17 MED ORDER — HYDROCODONE-ACETAMINOPHEN 5-325 MG PO TABS: 1.0000 | ORAL_TABLET | Freq: Three times a day (TID) | ORAL | 0 refills | Status: DC | PRN

## 2017-06-19 MED ORDER — HYDROCODONE-ACETAMINOPHEN 5-325 MG PO TABS: 1.0000 | ORAL_TABLET | Freq: Three times a day (TID) | ORAL | 0 refills | Status: DC | PRN

## 2017-06-19 NOTE — Addendum Note (Signed)
Addended by: Candelaria StagersGONFA, Tavonna Worthington T on: 06/19/2017 02:25 PM   Modules accepted: Orders

## 2017-06-19 NOTE — Telephone Encounter (Signed)
Epic shows that the Rx was printed but not placed up front.  Verified that Pharmacy did not receive.  Will forward to MD. Fleeger, Maryjo RochesterJessica Dawn, CMA

## 2017-06-19 NOTE — Telephone Encounter (Signed)
Pt called and said that [facility] [place] [facility] not receive her refill for her Hydrocodone. I verified that we were sending it to the correct pharmacy. I offered to transfer her to a nurse and she said she had already left a voicemail. Pt wants to be contacted to discuss what she needs to do.

## 2017-06-19 NOTE — Telephone Encounter (Signed)
Sorry about this! Sent Rx to her pharmacy now. Thanks, Alwyn Renaye

## 2017-06-19 NOTE — Telephone Encounter (Addendum)
LVM asking for return call to our office. If pt calls, please let her know that the Rx was sent to pharmacy.Sunday SpillersSharon T Saunders, CMA

## 2017-06-20 ENCOUNTER — Encounter: Payer: Self-pay | Admitting: Student

## 2017-07-10 ENCOUNTER — Other Ambulatory Visit: Payer: Self-pay

## 2017-07-10 ENCOUNTER — Ambulatory Visit (INDEPENDENT_AMBULATORY_CARE_PROVIDER_SITE_OTHER): Payer: Managed Care, Other (non HMO) | Admitting: Student

## 2017-07-10 ENCOUNTER — Telehealth: Payer: Self-pay

## 2017-07-10 ENCOUNTER — Encounter: Payer: Self-pay | Admitting: Student

## 2017-07-10 VITALS — BP 124/80 | HR 76 | Temp 97.7°F | Wt >= 6400 oz

## 2017-07-10 DIAGNOSIS — I890 Lymphedema, not elsewhere classified: Secondary | ICD-10-CM | POA: Diagnosis not present

## 2017-07-10 NOTE — Telephone Encounter (Signed)
Patient left message that prescription for compression stockings were written for one and not two.  Needs new prescription written for two compression stockings must say 30-40 mmHg bilaterally for insurance to cover.  Call back for questions and to let her know its ready to pick up: [phone].  Ples Specter, RN [facility] RN)

## 2017-07-10 NOTE — Patient Instructions (Signed)
It was great seeing you today! We have addressed the following issues today -Leg swelling/lymphedema: Please call the local medical supply offices and find out if they can customize compression stocking.  We have also send a referral to [employer].  Someone will get in touch with you about this referral in the next 1 to 2 weeks.  Please try to elevate your leg as much as possible.   If we did any lab work today, and the results require attention, either me or my nurse will get in touch with you. If everything is normal, you will get a letter in mail and a message via . If you don't hear from [place] in two weeks, please give [place] a call. Otherwise, we look forward to seeing you again at your next visit. If you have any questions or concerns before then, please call the clinic at [phone].  Please bring all your medications to every doctors visit  Sign up for My Chart to have easy access to your labs results, and communication with your Primary care physician.    Please check-out at the front desk before leaving the clinic.    Take Care,   Dr. Alanda Slim

## 2017-07-10 NOTE — Progress Notes (Signed)
  Subjective:    Catherine Powell is a 52 y.o. old female here for follow-up on lymphedema.  HPI Lymphedema: chronic issue.  Attempted to refer to [facility] but they only takes patients with cancer.  She was not able to find compression stocking.  She only checked at 1 of medical supply stores in [place].  She is on Lasix 40 mg daily as needed for this. She noticed some blisters and fluid oozing.  She also noted some erythema but denies fever or chills. PMH/Problem List: has HYPERCHOLESTEROLEMIA; OBESITY, NOS; Migraine; Essential hypertension; Allergic rhinitis; Asthma, chronic; GASTROESOPHAGEAL REFLUX, NO ESOPHAGITIS; Cervical radiculopathy; Degenerative joint disease of knees; Vitamin D deficiency; Encounter for chronic pain management; Healthcare maintenance; Rash and nonspecific skin eruption; Chronic venous insufficiency; Acute medial meniscus tear of right knee; Candidiasis, intertrigo; Cellulitis of both lower extremities; and Lymphedema on their problem list.   has a past medical history of Allergic rhinitis, ASTHMA, PERSISTENT, MILD (05/08/2006), Carpal tunnel syndrome (05/08/2006), Degenerative joint disease of knees (05/01/2010), Depression (12/16/2006), ESSENTIAL HYPERTENSION (05/22/2006), GASTROESOPHAGEAL REFLUX, NO ESOPHAGITIS (05/08/2006), Heart murmur, HYPERCHOLESTEROLEMIA (05/08/2006), MIGRAINE NOS W/INTRACTABLE MIGRAINE (11/19/2006), Rheumatic fever, Right knee meniscal tear, SINUSITIS, CHRONIC, NOS (05/08/2006), and Spinal headache.  FH:  Family History  Problem Relation Age of Onset  . Migraines Mother     SH Social History   Tobacco Use  . Smoking status: Never Smoker  . Smokeless tobacco: Never Used  Substance Use Topics  . Alcohol use: Yes    Comment: rare  . Drug use: No    Review of Systems Review of systems negative except for pertinent positives and negatives in history of present illness above.     Objective:     Vitals:   07/10/17 0921  BP: 124/80    Pulse: 76  Temp: 97.7 F (36.5 C)  TempSrc: Oral  Weight: (!) 416 lb 9.6 oz (189 kg)   Body mass index is 63.34 kg/m.  Physical Exam  GEN: appears well & comfortable. No apparent distress. CVS: Significant edema bilaterally RESP: no IWOB MSK: Lymphedema and lower extremities bilaterally.  Some erythema and blisters but no increased warmth to touch.  Significant tenderness.  See picture for more. SKIN: as above NEURO: alert and oiented appropriately, no gross deficits   PSYCH: euthymic mood with congruent affect    Assessment and Plan:  1. Lymphedema: chronic issue.  Given a prescription for compression stockings and recommended calling medical supply stores in [place].  Referral to [employer]  Also recommended going online at [email or web address] to look for compression socks.  Meanwhile, recommended elevating her legs as much as possible. - Compression stockings - Ambulatory referral to Orthopedics  Return if symptoms worsen or fail to improve.  Almon Hercules, MD 07/10/17 Pager: [phone]

## 2017-07-11 ENCOUNTER — Other Ambulatory Visit: Payer: Self-pay | Admitting: Student

## 2017-07-11 ENCOUNTER — Encounter: Payer: Self-pay | Admitting: Student

## 2017-07-11 DIAGNOSIS — I890 Lymphedema, not elsewhere classified: Secondary | ICD-10-CM

## 2017-07-11 DIAGNOSIS — G8929 Other chronic pain: Secondary | ICD-10-CM

## 2017-07-11 DIAGNOSIS — M170 Bilateral primary osteoarthritis of knee: Secondary | ICD-10-CM

## 2017-07-11 NOTE — Telephone Encounter (Signed)
Left her prescription at front desk for the patient to pick up.  Sent her my chart message.

## 2017-07-13 ENCOUNTER — Encounter: Payer: Self-pay | Admitting: Student

## 2017-07-14 NOTE — Telephone Encounter (Signed)
LVM for pt to call the office. If pt calls, please let her know the Rx is up front for pick up.Sharon T Saunders, CMA  

## 2017-07-15 ENCOUNTER — Telehealth: Payer: Self-pay | Admitting: *Deleted

## 2017-07-15 MED ORDER — TORSEMIDE 20 MG PO TABS: 20.0000 mg | ORAL_TABLET | Freq: Every day | ORAL | 3 refills | Status: DC

## 2017-07-15 MED ORDER — HYDROCODONE-ACETAMINOPHEN 5-325 MG PO TABS: 1.0000 | ORAL_TABLET | Freq: Three times a day (TID) | ORAL | 0 refills | Status: DC | PRN

## 2017-07-15 NOTE — Telephone Encounter (Signed)
Faxed Rx per Dr. Alanda Slim from my chart message. Lamonte Sakai, Dannie Hattabaugh D, [place]

## 2017-07-15 NOTE — Telephone Encounter (Signed)
Pt husband came and picked up rx as well. Lamonte Sakai, April D, [place]

## 2017-07-25 ENCOUNTER — Telehealth: Payer: Self-pay

## 2017-07-25 NOTE — Telephone Encounter (Signed)
Forms brought to RN by Dr. Alanda Slim to fax to [facility]. Copies placed in scan box and original faxed. Shawna Orleans, RN

## 2017-07-27 ENCOUNTER — Other Ambulatory Visit: Payer: Self-pay | Admitting: Student

## 2017-07-27 DIAGNOSIS — E559 Vitamin D deficiency, unspecified: Secondary | ICD-10-CM

## 2017-08-08 ENCOUNTER — Other Ambulatory Visit: Payer: Self-pay | Admitting: Student

## 2017-08-08 DIAGNOSIS — E559 Vitamin D deficiency, unspecified: Secondary | ICD-10-CM

## 2017-08-09 ENCOUNTER — Encounter: Payer: Self-pay | Admitting: Student

## 2017-08-11 ENCOUNTER — Other Ambulatory Visit: Payer: Self-pay | Admitting: Student

## 2017-08-11 DIAGNOSIS — G43109 Migraine with aura, not intractable, without status migrainosus: Secondary | ICD-10-CM

## 2017-08-11 DIAGNOSIS — G8929 Other chronic pain: Secondary | ICD-10-CM

## 2017-08-11 DIAGNOSIS — M170 Bilateral primary osteoarthritis of knee: Secondary | ICD-10-CM

## 2017-08-11 DIAGNOSIS — K219 Gastro-esophageal reflux disease without esophagitis: Secondary | ICD-10-CM

## 2017-08-12 MED ORDER — TOPIRAMATE 25 MG PO TABS
25.0000 mg | ORAL_TABLET | Freq: Every day | ORAL | 0 refills | Status: DC
Start: 2017-08-12 — End: 2017-09-03

## 2017-08-12 MED ORDER — ESOMEPRAZOLE MAGNESIUM 20 MG PO CPDR: 20.0000 mg | DELAYED_RELEASE_CAPSULE | Freq: Every day | ORAL | 0 refills | Status: DC

## 2017-08-12 MED ORDER — MELOXICAM 15 MG PO TABS: 15.0000 mg | ORAL_TABLET | Freq: Every day | ORAL | 0 refills | Status: DC

## 2017-08-13 MED ORDER — HYDROCODONE-ACETAMINOPHEN 5-325 MG PO TABS: 1.0000 | ORAL_TABLET | Freq: Three times a day (TID) | ORAL | 0 refills | Status: DC | PRN

## 2017-09-02 ENCOUNTER — Telehealth: Payer: Self-pay | Admitting: *Deleted

## 2017-09-02 NOTE — Telephone Encounter (Signed)
Pt is requesting a note (she will print from her mychart).  They have taken down all of the handicap spots @ her apartment, but the manager said if she got a note form her doctor stating that "because of her swelling she needs close parking" they can accommodate her. Sanjeev Main, Maryjo RochesterJessica Dawn, CMA

## 2017-09-03 ENCOUNTER — Encounter: Payer: Self-pay | Admitting: Student

## 2017-09-03 ENCOUNTER — Other Ambulatory Visit: Payer: Self-pay

## 2017-09-03 ENCOUNTER — Ambulatory Visit (INDEPENDENT_AMBULATORY_CARE_PROVIDER_SITE_OTHER): Payer: Managed Care, Other (non HMO) | Admitting: Student

## 2017-09-03 ENCOUNTER — Other Ambulatory Visit: Payer: Self-pay | Admitting: Student

## 2017-09-03 ENCOUNTER — Telehealth: Payer: Self-pay | Admitting: Student

## 2017-09-03 VITALS — BP 122/80 | HR 83 | Temp 98.3°F | Wt >= 6400 oz

## 2017-09-03 DIAGNOSIS — J4530 Mild persistent asthma, uncomplicated: Secondary | ICD-10-CM | POA: Diagnosis not present

## 2017-09-03 DIAGNOSIS — Z79899 Other long term (current) drug therapy: Secondary | ICD-10-CM

## 2017-09-03 DIAGNOSIS — E559 Vitamin D deficiency, unspecified: Secondary | ICD-10-CM

## 2017-09-03 DIAGNOSIS — I872 Venous insufficiency (chronic) (peripheral): Secondary | ICD-10-CM | POA: Diagnosis not present

## 2017-09-03 DIAGNOSIS — G43109 Migraine with aura, not intractable, without status migrainosus: Secondary | ICD-10-CM

## 2017-09-03 DIAGNOSIS — I10 Essential (primary) hypertension: Secondary | ICD-10-CM | POA: Diagnosis not present

## 2017-09-03 DIAGNOSIS — G8929 Other chronic pain: Secondary | ICD-10-CM

## 2017-09-03 DIAGNOSIS — M545 Low back pain, unspecified: Secondary | ICD-10-CM | POA: Insufficient documentation

## 2017-09-03 DIAGNOSIS — M170 Bilateral primary osteoarthritis of knee: Secondary | ICD-10-CM

## 2017-09-03 LAB — POCT GLYCOSYLATED HEMOGLOBIN (HGB A1C): Hemoglobin A1C: 5.3 % (ref 4.0–5.6)

## 2017-09-03 MED ORDER — TORSEMIDE 20 MG PO TABS
10.0000 mg | ORAL_TABLET | Freq: Every day | ORAL | 3 refills | Status: DC
Start: 2017-09-03 — End: 2018-06-15

## 2017-09-03 MED ORDER — BUDESONIDE-FORMOTEROL FUMARATE 160-4.5 MCG/ACT IN AERO: 1.0000 | INHALATION_SPRAY | Freq: Two times a day (BID) | RESPIRATORY_TRACT | 11 refills | Status: DC

## 2017-09-03 NOTE — Telephone Encounter (Signed)
Clinical info completed on Handicap Parking form.  Place form in Dr. Sundra AlandGonfa's box for completion.  Catherine Powell, Catherine Powell, [place]

## 2017-09-03 NOTE — Telephone Encounter (Signed)
Pt's husband came in office and dropped a DMV Renewal of  Disability Parking Placard requesting to be filled and signed by MD. Last DOS 09-03-2017. Best phone # to contact is [phone]. Form was placed in [place] team folder.

## 2017-09-03 NOTE — Patient Instructions (Signed)
It was great seeing you today! We have addressed the following issues today  Medication review: we reviewed and updated your medication list.  Back/Flank pain: we are checking a urine, electrolytes and kidney numbers today.  If we did any lab work today, and the results require attention, either me or my nurse will get in touch with you. If everything is normal, you will get a letter in mail and a message via . If you don't hear from us in two weeks, please give us a call. Otherwise, we look forward to seeing you again at your next visit. If you have any questions or concerns before then, please call the clinic at [phone].  Please bring all your medications to every doctors visit  Sign up for My Chart to have easy access to your labs results, and communication with your Primary care physician.    Please check-out at the front desk before leaving the clinic.    Take Care,   Dr. Alanda SlimGonfa

## 2017-09-03 NOTE — Progress Notes (Unsigned)
  Subjective:    Catherine Powell is a 52 y.o. old female here for medication review  HPI  PMH/Problem List: has HYPERCHOLESTEROLEMIA; OBESITY, NOS; Migraine; Essential hypertension; Allergic rhinitis; Asthma, chronic; GASTROESOPHAGEAL REFLUX, NO ESOPHAGITIS; Cervical radiculopathy; Degenerative joint disease of knees; Vitamin D deficiency; Encounter for chronic pain management; Healthcare maintenance; Rash and nonspecific skin eruption; Chronic venous insufficiency; Acute medial meniscus tear of right knee; Candidiasis, intertrigo; Cellulitis of both lower extremities; and Lymphedema on their problem list.   has a past medical history of Allergic rhinitis, ASTHMA, PERSISTENT, MILD (05/08/2006), Carpal tunnel syndrome (05/08/2006), Degenerative joint disease of knees (05/01/2010), Depression (12/16/2006), ESSENTIAL HYPERTENSION (05/22/2006), GASTROESOPHAGEAL REFLUX, NO ESOPHAGITIS (05/08/2006), Heart murmur, HYPERCHOLESTEROLEMIA (05/08/2006), MIGRAINE NOS W/INTRACTABLE MIGRAINE (11/19/2006), Rheumatic fever, Right knee meniscal tear, SINUSITIS, CHRONIC, NOS (05/08/2006), and Spinal headache.  FH:  Family History  Problem Relation Age of Onset  . Migraines Mother     SH Social History   Tobacco Use  . Smoking status: Never Smoker  . Smokeless tobacco: Never Used  Substance Use Topics  . Alcohol use: Yes    Comment: rare  . Drug use: No    Review of Systems Review of systems negative except for pertinent positives and negatives in history of present illness above.     Objective:     There were no vitals filed for this visit. There is no height or weight on file to calculate BMI.  Physical Exam  GEN: appears well & comfortable. No apparent distress. Head: normocephalic and atraumatic  Eyes: conjunctiva without injection. Sclera anicteric. Ears: external ear, ear canal and TM normal Nares: no rhinorrhea. *** swollen turbinates. ***erythema of nasal mucosa Oropharynx: MMM. No erythema.  ***exudation or petechiae.  Uvula midline HEM: negative for cervical or periauricular lymphadenopathies CVS: RRR, nl s1 & s2, no murmurs, no edema,  2+ *** pulses bilaterally, cap refills brisk RESP: no IWOB, good air movement bilaterally, CTAB GI: BS present & normal, soft, NTND, no guarding, no rebound, no palpable mass GU: no suprapubic or CVA tenderness*** MSK: no focal tenderness or notable swelling SKIN: no apparent skin lesion *** ENDO: negative thyromegally *** NEURO: alert and oiented appropriately, no gross deficits   PSYCH: euthymic mood with congruent affect ***     Assessment and Plan:     No follow-ups on file.  Almon Herculesaye T Rainn Zupko, MD 09/03/17 Pager: [phone]

## 2017-09-03 NOTE — Telephone Encounter (Signed)
Done

## 2017-09-03 NOTE — Progress Notes (Signed)
  Subjective:    Catherine SpragueBeverly is a 52 y.o. old female here for medication review and right back pain.  HPI Right back pain: Spontaneous onset.  No trauma.  Started after we changed her Lasix to torsemide.  Improved after reducing her torsemide from 20 mg to 10 mg.  She denies urinary symptoms.  Denies hematuria.  Denies history of kidney stone.  No fever or chills.  Medication review: like to have an updated list of her medication.  PMH/Problem List: has HYPERCHOLESTEROLEMIA; OBESITY, NOS; Migraine; Essential hypertension; Allergic rhinitis; Asthma, chronic; GASTROESOPHAGEAL REFLUX, NO ESOPHAGITIS; Cervical radiculopathy; Degenerative joint disease of knees; Vitamin D deficiency; Encounter for chronic pain management; Healthcare maintenance; Rash and nonspecific skin eruption; Chronic venous insufficiency; Acute medial meniscus tear of right knee; Candidiasis, intertrigo; Cellulitis of both lower extremities; Lymphedema; Acute right-sided low back pain without sciatica; and Asthma, chronic, mild persistent, uncomplicated on their problem list.   has a past medical history of Allergic rhinitis, ASTHMA, PERSISTENT, MILD (05/08/2006), Carpal tunnel syndrome (05/08/2006), Degenerative joint disease of knees (05/01/2010), Depression (12/16/2006), ESSENTIAL HYPERTENSION (05/22/2006), GASTROESOPHAGEAL REFLUX, NO ESOPHAGITIS (05/08/2006), Heart murmur, HYPERCHOLESTEROLEMIA (05/08/2006), MIGRAINE NOS W/INTRACTABLE MIGRAINE (11/19/2006), Rheumatic fever, Right knee meniscal tear, SINUSITIS, CHRONIC, NOS (05/08/2006), and Spinal headache.  FH:  Family History  Problem Relation Age of Onset  . Migraines Mother     SH Social History   Tobacco Use  . Smoking status: Never Smoker  . Smokeless tobacco: Never Used  Substance Use Topics  . Alcohol use: Yes    Comment: rare  . Drug use: No    Review of Systems Review of systems negative except for pertinent positives and negatives in history of present illness  above.     Objective:     Vitals:   09/03/17 1356  BP: 122/80  Pulse: 83  Temp: 98.3 F (36.8 C)  TempSrc: Oral  SpO2: 96%  Weight: (!) 413 lb (187.3 kg)   Body mass index is 62.8 kg/m.  Physical Exam  GEN: appears well & comfortable. No apparent distress. CVS: RRR, nl s1 & s2, no murmurs, no edema RESP: no IWOB GI: BS present & normal, soft, NTND.  Limited exam due to body habitus. GU: no suprapubic or CVA tenderness MSK: no focal tenderness or notable swelling SKIN: mild erythema just above her ankles bilaterally.  Increased warmth to touch. NEURO: alert and oiented appropriately, no gross deficits   PSYCH: euthymic mood with congruent affect    Assessment and Plan:  1. Acute right-sided low back pain without sciatica: doubt this is coming from the kidney.  Pain likely musculoskeletal back muscle strain.  It could also be due to electrolyte issue given recent change to her diuretics. -Urinalysis -BMP.  2. Essential hypertension - Basic metabolic panel  3. Asthma, chronic, mild persistent, uncomplicated: Stable - budesonide-formoterol (SYMBICORT) 160-4.5 MCG/ACT inhaler; Inhale 1 puff into the lungs 2 (two) times daily.  Dispense: 1 Inhaler; Refill: 11  4. Chronic venous insufficiency: Stable - torsemide (DEMADEX) 20 MG tablet; Take 0.5-1 tablets (10-20 mg total) by mouth daily.  Dispense: 90 tablet; Refill: 3  5. Medication review: Reviewed medication list and updated in EMR  Return if symptoms worsen or fail to improve.  Almon Herculesaye T Rmoni Keplinger, MD 09/03/17 Pager: [phone]

## 2017-09-04 LAB — BASIC METABOLIC PANEL
BUN/Creatinine Ratio: 13 (ref 9–23)
BUN: 11 mg/dL (ref 6–24)
CALCIUM: 8.8 mg/dL (ref 8.7–10.2)
CO2: 25 mmol/L (ref 20–29)
CREATININE: 0.86 mg/dL (ref 0.57–1.00)
Chloride: 104 mmol/L (ref 96–106)
GFR calc Af Amer: 89 mL/min/{1.73_m2} (ref >59)
GFR, EST NON AFRICAN AMERICAN: 77 mL/min/{1.73_m2} (ref >59)
Glucose: 137 mg/dL — ABNORMAL HIGH (ref 65–99)
Potassium: 4.2 mmol/L (ref 3.5–5.2)
Sodium: 140 mmol/L (ref 134–144)

## 2017-09-04 MED ORDER — HYDROCODONE-ACETAMINOPHEN 5-325 MG PO TABS
1.0000 | ORAL_TABLET | Freq: Three times a day (TID) | ORAL | 0 refills | Status: DC | PRN
Start: 2017-09-04 — End: 2017-09-08

## 2017-09-04 MED ORDER — CHOLECALCIFEROL 25 MCG (1000 UT) PO CAPS: 1000.0000 [IU] | ORAL_CAPSULE | Freq: Every day | ORAL | 3 refills | Status: DC

## 2017-09-04 MED ORDER — TOPIRAMATE 25 MG PO TABS: 25.0000 mg | ORAL_TABLET | Freq: Every day | ORAL | 3 refills | Status: DC

## 2017-09-04 NOTE — Telephone Encounter (Signed)
Form reviewed, completed, signed and placed in RN's box.  Note routed to RN team inbasket. Bryden Darden T Marasia Newhall, MD 

## 2017-09-04 NOTE — Telephone Encounter (Signed)
Patient aware via MyChart. Ples SpecterAlisa Burel Kahre, RN [facility] RN)

## 2017-09-08 ENCOUNTER — Other Ambulatory Visit: Payer: Self-pay

## 2017-09-08 ENCOUNTER — Telehealth: Payer: Self-pay

## 2017-09-08 DIAGNOSIS — M170 Bilateral primary osteoarthritis of knee: Secondary | ICD-10-CM

## 2017-09-08 DIAGNOSIS — G8929 Other chronic pain: Secondary | ICD-10-CM

## 2017-09-08 MED ORDER — HYDROCODONE-ACETAMINOPHEN 5-325 MG PO TABS: 1.0000 | ORAL_TABLET | Freq: Three times a day (TID) | ORAL | 0 refills | Status: DC | PRN

## 2017-09-08 NOTE — Telephone Encounter (Signed)
Pt called nurse line stating her pain medication could not be processed due to previous pcp no longer valid here. Gonfa had prescribed hydrocodone for pt last week, per pharmacist, this can not filled until new pcp sends a new rx, since the doctor can not be changed on original prescription. Please advise.

## 2017-09-08 NOTE — Telephone Encounter (Signed)
I refilled this medication since the previous refill could not be completed.  Thank you!

## 2017-09-08 NOTE — Telephone Encounter (Signed)
[facility] of Symbicort. Clinical questions answered via CoverMyMeds. Med approved 08/09/17 -09/07/2020. [facility] notified. Ples SpecterAlisa Herold Salguero, RN [facility] RN)

## 2017-09-10 ENCOUNTER — Ambulatory Visit: Payer: Managed Care, Other (non HMO)

## 2017-10-02 ENCOUNTER — Ambulatory Visit (INDEPENDENT_AMBULATORY_CARE_PROVIDER_SITE_OTHER): Payer: Managed Care, Other (non HMO) | Admitting: Family Medicine

## 2017-10-02 ENCOUNTER — Encounter: Payer: Self-pay | Admitting: Family Medicine

## 2017-10-02 ENCOUNTER — Other Ambulatory Visit: Payer: Self-pay

## 2017-10-02 VITALS — BP 116/72 | HR 75 | Temp 98.2°F | Ht 68.0 in | Wt >= 6400 oz

## 2017-10-02 DIAGNOSIS — G43109 Migraine with aura, not intractable, without status migrainosus: Secondary | ICD-10-CM | POA: Diagnosis not present

## 2017-10-02 MED ORDER — KETOROLAC TROMETHAMINE 30 MG/ML IJ SOLN
30.0000 mg | Freq: Once | INTRAMUSCULAR | Status: AC
Start: 2017-10-02 — End: 2017-10-02
  Administered 2017-10-02: 30 mg via INTRAMUSCULAR

## 2017-10-02 MED ORDER — PROMETHAZINE HCL 25 MG PO TABS
25.0000 mg | ORAL_TABLET | Freq: Once | ORAL | Status: AC
Start: 2017-10-02 — End: 2017-10-02
  Administered 2017-10-02: 25 mg via ORAL

## 2017-10-02 MED ORDER — DEXAMETHASONE SODIUM PHOSPHATE 10 MG/ML IJ SOLN
10.0000 mg | Freq: Once | INTRAMUSCULAR | Status: AC
Start: 2017-10-02 — End: 2017-10-02
  Administered 2017-10-02: 10 mg via INTRAMUSCULAR

## 2017-10-02 NOTE — Patient Instructions (Signed)
I gave you phenergan, decadron and Toradol today. In the future, try taking the summatryptan as early as possible for migraines.

## 2017-10-03 ENCOUNTER — Encounter: Payer: Self-pay | Admitting: Family Medicine

## 2017-10-03 NOTE — Assessment & Plan Note (Signed)
Acute migraine.  Toradol, phenergan and decadron.

## 2017-10-03 NOTE — Progress Notes (Signed)
   Subjective:    Patient ID: Catherine Powell, female    DOB: 05/28/1965, 52 y.o.   MRN: [id number]  HPI Patient with known chronic severe migraines presents with 2 day headache which began with scotoma and now has photophobia and nausea.  The headache is typical for her migraines except that eyes are more red than usual.  No exposure to conjunctivis.  No seasonal allergies.   She is already on a prophylactic and abortive med.  Neither worked for her.  Husband drove.  She has previously responded well to the migraine  Cocktail of toradol 30, phenergan 25 and decadron 10mg     Review of Systems     Objective:   Physical Exam Bilateral conjunctival injection.  Corneas clear.  No purulent discharge. Neuro, no focal abnormalities Abd mild epigastric tenderness.       Assessment & Plan:

## 2017-10-17 ENCOUNTER — Other Ambulatory Visit: Payer: Self-pay | Admitting: Family Medicine

## 2017-10-17 DIAGNOSIS — M170 Bilateral primary osteoarthritis of knee: Secondary | ICD-10-CM

## 2017-10-17 DIAGNOSIS — G8929 Other chronic pain: Secondary | ICD-10-CM

## 2017-10-17 MED ORDER — HYDROCODONE-ACETAMINOPHEN 5-325 MG PO TABS: 1.0000 | ORAL_TABLET | Freq: Three times a day (TID) | ORAL | 0 refills | Status: DC | PRN

## 2017-11-17 ENCOUNTER — Other Ambulatory Visit: Payer: Self-pay

## 2017-11-17 ENCOUNTER — Other Ambulatory Visit: Payer: Self-pay | Admitting: Family Medicine

## 2017-11-17 DIAGNOSIS — M170 Bilateral primary osteoarthritis of knee: Secondary | ICD-10-CM

## 2017-11-17 DIAGNOSIS — G8929 Other chronic pain: Secondary | ICD-10-CM

## 2017-11-17 DIAGNOSIS — K219 Gastro-esophageal reflux disease without esophagitis: Secondary | ICD-10-CM

## 2017-11-18 MED ORDER — ESOMEPRAZOLE MAGNESIUM 20 MG PO CPDR: 20.0000 mg | DELAYED_RELEASE_CAPSULE | Freq: Every day | ORAL | 0 refills | Status: DC

## 2017-11-19 ENCOUNTER — Encounter: Payer: Self-pay | Admitting: Family Medicine

## 2017-11-19 DIAGNOSIS — G8929 Other chronic pain: Secondary | ICD-10-CM

## 2017-11-19 DIAGNOSIS — M170 Bilateral primary osteoarthritis of knee: Secondary | ICD-10-CM

## 2017-11-19 MED ORDER — HYDROCODONE-ACETAMINOPHEN 5-325 MG PO TABS: 1.0000 | ORAL_TABLET | Freq: Three times a day (TID) | ORAL | 0 refills | Status: DC | PRN

## 2017-11-19 NOTE — Telephone Encounter (Signed)
Called.  Seems like this has been a chronic med by Dr. Alanda Slim.  Has not seen Dr. Frances Furbish, her new PCP, but has appointment on 10/14.  Will refill x 1 only.  She must keep appointment and also plan to discuss with Dr. Frances Furbish best approach to her chronic pain.

## 2017-11-21 ENCOUNTER — Other Ambulatory Visit: Payer: Self-pay | Admitting: Family Medicine

## 2017-12-19 ENCOUNTER — Other Ambulatory Visit: Payer: Self-pay

## 2017-12-19 DIAGNOSIS — G43109 Migraine with aura, not intractable, without status migrainosus: Secondary | ICD-10-CM

## 2017-12-19 MED ORDER — SUMATRIPTAN SUCCINATE 50 MG PO TABS: ORAL_TABLET | ORAL | 3 refills | Status: DC

## 2017-12-19 NOTE — Telephone Encounter (Signed)
Patient calling to ask if this request was received. States she is having a migraine and hopes to have it filled today.  Ples Specter, RN [facility] RN)

## 2017-12-22 ENCOUNTER — Other Ambulatory Visit: Payer: Self-pay

## 2017-12-22 ENCOUNTER — Encounter: Payer: Self-pay | Admitting: Family Medicine

## 2017-12-22 ENCOUNTER — Ambulatory Visit (INDEPENDENT_AMBULATORY_CARE_PROVIDER_SITE_OTHER): Payer: Managed Care, Other (non HMO) | Admitting: Family Medicine

## 2017-12-22 VITALS — BP 122/70 | HR 76 | Temp 97.9°F | Ht 68.0 in | Wt >= 6400 oz

## 2017-12-22 DIAGNOSIS — Z792 Long term (current) use of antibiotics: Secondary | ICD-10-CM | POA: Diagnosis not present

## 2017-12-22 DIAGNOSIS — M170 Bilateral primary osteoarthritis of knee: Secondary | ICD-10-CM | POA: Diagnosis not present

## 2017-12-22 DIAGNOSIS — G8929 Other chronic pain: Secondary | ICD-10-CM | POA: Diagnosis not present

## 2017-12-22 MED ORDER — AMOXICILLIN 500 MG PO CAPS: 500.0000 mg | ORAL_CAPSULE | Freq: Four times a day (QID) | ORAL | 0 refills | Status: DC

## 2017-12-22 MED ORDER — HYDROCODONE-ACETAMINOPHEN 5-325 MG PO TABS: 1.0000 | ORAL_TABLET | Freq: Three times a day (TID) | ORAL | 0 refills | Status: DC | PRN

## 2017-12-22 NOTE — Patient Instructions (Signed)
It was nice meeting you today Ms. Hooker-Newbern!  I have refilled your Vicodin today, with 80 pills rather than 90.  Please take this only as needed.    I have also sent in four capsules of amoxicillin.  Please take all four on the day of your dental cleaning.    Good luck on your weight loss and congratulations on losing five pounds!  If you have any questions or concerns, please feel free to call the clinic.   Be well,  Dr. Frances Furbish

## 2017-12-22 NOTE — Progress Notes (Signed)
Subjective:    Catherine Powell - 52 y.o. female MRN [id number]  Date of birth: 01-20-1966  HPI  Catherine Powell is here for refill of her Vicodin and for antibiotic prophylaxis prior to a dental cleaning.  Chronic pain due to osteoarthritis and migraines -Patient uses Vicodin for relief of her severe osteoarthritis of her knees and ankles -Has used this medication for years after trying other medicines such as tramadol and Tylenol -Understands that she needs to lose weight to help with her arthritis and have her qualify for knee replacements and is in the process of doing this -Has already lost 5 pounds and has an app on her phone to help her with lifestyle modifications -Understands the risks of opioid medications and does not drive after taking these medications  Antibiotic prophylaxis -Patient had rheumatic 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 COLONOSCOPY  01/15/2014  . MAMMOGRAM  01/15/2017  . INFLUENZA VACCINE  10/09/2017    -  reports that she has never smoked. She has never used smokeless tobacco. - Review of Systems: Per HPI. - Past Medical History: Patient Active Problem List   Diagnosis Date Noted  . Prophylactic antibiotic 12/23/2017  . Acute right-sided low back pain without sciatica 09/03/2017  . Asthma, chronic, mild persistent, uncomplicated 09/03/2017  . Lymphedema 04/29/2017  . Cellulitis of both lower extremities 12/16/2016  . Candidiasis, intertrigo 12/05/2016  . Acute medial meniscus tear of right knee 06/15/2016  . Chronic venous insufficiency 06/27/2015  . Rash and nonspecific skin eruption 01/23/2015  . Encounter for medication review 12/22/2014  . Vitamin D  deficiency 05/11/2014  . Encounter for chronic pain management 05/11/2014  . Degenerative joint disease of knees 05/01/2010  . Cervical radiculopathy 08/06/2007  . Migraine 11/19/2006  . Essential hypertension 05/22/2006  . HYPERCHOLESTEROLEMIA 05/08/2006  . OBESITY, NOS 05/08/2006  . Allergic rhinitis 05/08/2006  . Asthma, chronic 05/08/2006  . GASTROESOPHAGEAL REFLUX, NO ESOPHAGITIS 05/08/2006   - Medications: reviewed and updated   Objective:   Physical Exam BP 122/70   Pulse 76   Temp 97.9 F (36.6 C) (Oral)   Ht 5\' 8"  (1.727 m)   Wt (!) 412 lb 3.2 oz (187 kg)   LMP 05/15/2012   SpO2 97%   BMI 62.67 kg/m  Gen: NAD, alert, cooperative with exam, morbidly obese, pleasant CV: RRR, good S1/S2, no murmur, no edema Resp: CTABL, no wheezes, non-labored Abd: SNTND, BS present, no guarding or organomegaly Skin: no rashes, normal turgor  Neuro: no gross deficits.  Psych: good insight, alert and oriented        Assessment & Plan:   Degenerative joint disease of knees Discussed with patient the side effects of narcotics in detail and told her that this is not a good long-term solution to her problem.  She does seem to understand that the solution to this problem lies in weight loss and knee replacement.  Since she has been on this medication for a long time and has tried many other alternatives, I will refill her Norco today, with 80 pills for the next month rather than 90 and hopes to wean down as patient is able to lose more weight.  Encounter for chronic pain management Side effects and indications of narcotics reviewed at length today.  Patient expressed understanding.  Prophylactic antibiotic Although evidence for the efficacy of preventative antibiotics and people with a history of rheumatic fever without prosthetic valves or other indications is lacking, I will prescribe amoxicillin 2 g to be taken before her dental cleaning since her dentist requested this and there is  very little potential for harm with 1 dose of this medication.  This was explained to the patient, and the patient was in agreement.    Lezlie Octave, M.D. 12/23/2017, 2:50 PM PGY-2, [facility]

## 2017-12-23 DIAGNOSIS — Z792 Long term (current) use of antibiotics: Secondary | ICD-10-CM

## 2017-12-23 NOTE — Assessment & Plan Note (Signed)
Discussed with patient the side effects of narcotics in detail and told her that this is not a good long-term solution to her problem.  She does seem to understand that the solution to this problem lies in weight loss and knee replacement.  Since she has been on this medication for a long time and has tried many other alternatives, I will refill her Norco today, with 80 pills for the next month rather than 90 and hopes to wean down as patient is able to lose more weight.

## 2017-12-23 NOTE — Assessment & Plan Note (Signed)
Side effects and indications of narcotics reviewed at length today.  Patient expressed understanding.

## 2017-12-23 NOTE — Assessment & Plan Note (Signed)
Although evidence for the efficacy of preventative antibiotics and people with a history of rheumatic fever without prosthetic valves or other indications is lacking, I will prescribe amoxicillin 2 g to be taken before her dental cleaning since her dentist requested this and there is very little potential for harm with 1 dose of this medication.  This was explained to the patient, and the patient was in agreement.

## 2018-01-10 ENCOUNTER — Other Ambulatory Visit: Payer: Self-pay | Admitting: Family Medicine

## 2018-01-10 DIAGNOSIS — K219 Gastro-esophageal reflux disease without esophagitis: Secondary | ICD-10-CM

## 2018-01-12 ENCOUNTER — Encounter: Payer: Self-pay | Admitting: Family Medicine

## 2018-01-20 ENCOUNTER — Other Ambulatory Visit: Payer: Self-pay | Admitting: Family Medicine

## 2018-01-20 DIAGNOSIS — M170 Bilateral primary osteoarthritis of knee: Secondary | ICD-10-CM

## 2018-01-20 DIAGNOSIS — G8929 Other chronic pain: Secondary | ICD-10-CM

## 2018-01-21 MED ORDER — HYDROCODONE-ACETAMINOPHEN 5-325 MG PO TABS: 1.0000 | ORAL_TABLET | Freq: Three times a day (TID) | ORAL | 0 refills | Status: DC | PRN

## 2018-02-20 ENCOUNTER — Other Ambulatory Visit: Payer: Self-pay

## 2018-02-20 ENCOUNTER — Ambulatory Visit (INDEPENDENT_AMBULATORY_CARE_PROVIDER_SITE_OTHER): Payer: Managed Care, Other (non HMO) | Admitting: Family Medicine

## 2018-02-20 ENCOUNTER — Encounter: Payer: Self-pay | Admitting: Family Medicine

## 2018-02-20 VITALS — BP 126/74 | HR 81 | Temp 97.7°F | Ht 68.0 in | Wt >= 6400 oz

## 2018-02-20 DIAGNOSIS — E7800 Pure hypercholesterolemia, unspecified: Secondary | ICD-10-CM

## 2018-02-20 DIAGNOSIS — G8929 Other chronic pain: Secondary | ICD-10-CM

## 2018-02-20 DIAGNOSIS — Z0000 Encounter for general adult medical examination without abnormal findings: Secondary | ICD-10-CM

## 2018-02-20 DIAGNOSIS — J4530 Mild persistent asthma, uncomplicated: Secondary | ICD-10-CM | POA: Diagnosis not present

## 2018-02-20 DIAGNOSIS — Z1212 Encounter for screening for malignant neoplasm of rectum: Secondary | ICD-10-CM

## 2018-02-20 DIAGNOSIS — Z23 Encounter for immunization: Secondary | ICD-10-CM

## 2018-02-20 DIAGNOSIS — K219 Gastro-esophageal reflux disease without esophagitis: Secondary | ICD-10-CM | POA: Diagnosis not present

## 2018-02-20 DIAGNOSIS — Z1239 Encounter for other screening for malignant neoplasm of breast: Secondary | ICD-10-CM

## 2018-02-20 DIAGNOSIS — M170 Bilateral primary osteoarthritis of knee: Secondary | ICD-10-CM

## 2018-02-20 DIAGNOSIS — Z1211 Encounter for screening for malignant neoplasm of colon: Secondary | ICD-10-CM | POA: Diagnosis not present

## 2018-02-20 MED ORDER — MONTELUKAST SODIUM 10 MG PO TABS: 10.0000 mg | ORAL_TABLET | Freq: Every day | ORAL | 3 refills | Status: DC

## 2018-02-20 MED ORDER — ESOMEPRAZOLE MAGNESIUM 40 MG PO CPDR: 40.0000 mg | DELAYED_RELEASE_CAPSULE | Freq: Every day | ORAL | 5 refills | Status: DC

## 2018-02-20 MED ORDER — HYDROCODONE-ACETAMINOPHEN 5-325 MG PO TABS: 1.0000 | ORAL_TABLET | Freq: Three times a day (TID) | ORAL | 0 refills | Status: DC | PRN

## 2018-02-20 MED ORDER — ALBUTEROL SULFATE HFA 108 (90 BASE) MCG/ACT IN AERS: 2.0000 | INHALATION_SPRAY | RESPIRATORY_TRACT | 3 refills | Status: DC | PRN

## 2018-02-20 NOTE — Assessment & Plan Note (Signed)
Prescribed Singulair and albuterol as needed today.  Reviewed with patient return precautions and the importance of taking Symbicort and Singulair daily and albuterol as needed.  Reassured that patient did not have wheezing and had good air movement on my exam.

## 2018-02-20 NOTE — Assessment & Plan Note (Addendum)
Reviewed with patient the medical recommendations behind getting a colonoscopy after age 54 and getting a mammogram yearly after age 54.  Patient says that she will make these appointments when she finds it available time.  Referrals were sent today.  A lipid panel was also performed, and we will use this to determine whether she needs to restart her Lipitor.

## 2018-02-20 NOTE — Patient Instructions (Addendum)
It was nice seeing you today Ms. Hooker-Radovich!  For your asthma, I am [facility]  Please take Singulair once every day and albuterol as needed.  Please come in to be seen if you develop shortness of breath that is not relieved by albuterol 2 puffs every 4 hours.  I am sending in referrals for you to get a mammogram and a colonoscopy.  Please call using the numbers we provided when you are ready to schedule these appointments.  We are getting a lipid panel today to check your cholesterol.  I will use this to determine if you need to restart your Lipitor or not.  I am sending in your Norco prescription for 75 tablets to [facility] today.  If you have any questions or concerns, please feel free to call the clinic.   Be well,  Dr. Frances FurbishWinfrey

## 2018-02-20 NOTE — Assessment & Plan Note (Addendum)
Reviewed with patient that I will continue to be weaning her Norco, and will prescribe 75 pills at this visit.  Patient was unhappy about this, but ultimately was agreeable.  She does not need refills on any of her other medications.  The [place] controlled substances database was reviewed, and patient has not been receiving controlled medications from any other source.

## 2018-02-20 NOTE — Assessment & Plan Note (Signed)
Reviewed with patient the side effects of long-term PPI use, such as vitamin deficiencies, osteoporosis, some reports of dementia.  Patient is understanding of this and would like to proceed with increasing her dose of Nexium to 40 mg daily.  This prescription was sent.

## 2018-02-20 NOTE — Progress Notes (Signed)
Subjective:    Catherine Powell - 52 y.o. female MRN [id number]  Date of birth: 08/21/1965  CC:  Catherine SalvoBeverly A Hooker-Keeter is here for wheezing, chronic pain.  Wheezing: - out of albuterol and singulair - continuing symbicort daily - worse in the morning  Chronic pain due to osteoarthritis and migraines: - continues to take Norco for migraine and arthritis pain, usually takes two pills daily - has been working on reducing nuts and chocolate and caffeine to reduce in - takes Excedrin when she does not see aura and Imitrex when she sees aura - takes Topomax daily -Does not feel dulled by her medications, although she feels that her migraines will sometimes cause some mental dullness -Does not have constipation  Acid reflux: -Feels that she needs an increased dose of her Nexium because her acid reflux has worsened recently -If she does not take a PPI, she says that she "is swallowing acid" in the mornings  Health Maintenance:  -Says that both of her parents had colorectal cancer and does not want to get a colonoscopy, but understands that she likely needs one.  Her last mammogram was in 2016 and she did not know if she needed one again because it looked normal at the time.  Is amenable to getting her flu shot today. Health Maintenance Due  Topic Date Due  . COLONOSCOPY  01/15/2014  . MAMMOGRAM  01/15/2017  . INFLUENZA VACCINE  10/09/2017    -  reports that she has never smoked. She has never used smokeless tobacco. - Review of Systems: Per HPI. - Past Medical History: Patient Active Problem List   Diagnosis Date Noted  . Healthcare maintenance 02/20/2018  . Prophylactic antibiotic 12/23/2017  . Acute right-sided low back pain without sciatica 09/03/2017  . Asthma, chronic, mild persistent, uncomplicated 09/03/2017  . Lymphedema 04/29/2017  . Cellulitis of both lower extremities 12/16/2016  . Candidiasis, intertrigo 12/05/2016  . Acute medial meniscus tear of right  knee 06/15/2016  . Chronic venous insufficiency 06/27/2015  . Rash and nonspecific skin eruption 01/23/2015  . Vitamin D deficiency 05/11/2014  . Encounter for chronic pain management 05/11/2014  . Degenerative joint disease of knees 05/01/2010  . Cervical radiculopathy 08/06/2007  . Migraine 11/19/2006  . Essential hypertension 05/22/2006  . HYPERCHOLESTEROLEMIA 05/08/2006  . OBESITY, NOS 05/08/2006  . Allergic rhinitis 05/08/2006  . Asthma, chronic 05/08/2006  . GASTROESOPHAGEAL REFLUX, NO ESOPHAGITIS 05/08/2006   - Medications: reviewed and updated   Objective:   Physical Exam BP 126/74   Pulse 81   Temp 97.7 F (36.5 C) (Oral)   Ht 5\' 8"  (1.727 m)   Wt (!) 415 lb 6.4 oz (188.4 kg)   LMP 05/15/2012   SpO2 96%   BMI 63.16 kg/m  Gen: NAD, alert, cooperative with exam, morbidly obese CV: RRR, good S1/S2, no murmur Resp: CTABL, no wheezes, non-labored, air movement in all lung fields Psych: Flat affect, appears slightly anxious     Assessment & Plan:   Asthma, chronic, mild persistent, uncomplicated Prescribed Singulair and albuterol as needed today.  Reviewed with patient return precautions and the importance of taking Symbicort and Singulair daily and albuterol as needed.  Reassured that patient did not have wheezing and had good air movement on my exam.  Encounter for chronic pain management Reviewed with patient that I will continue to be weaning her Norco, and will prescribe 75 pills at this visit.  Patient was unhappy about this, but ultimately was agreeable.  She  does not need refills on any of her other medications.  The [place] controlled substances database was reviewed, and patient has not been receiving controlled medications from any other source.  GASTROESOPHAGEAL REFLUX, NO ESOPHAGITIS Reviewed with patient the side effects of long-term PPI use, such as vitamin deficiencies, osteoporosis, some reports of dementia.  Patient is understanding of this and  would like to proceed with increasing her dose of Nexium to 40 mg daily.  This prescription was sent.  Healthcare maintenance Reviewed with patient the medical recommendations behind getting a colonoscopy after age 99 and getting a mammogram yearly after age 54.  Patient says that she will make these appointments when she finds it available time.  Referrals were sent today.    Lezlie Octave, M.D. 02/20/2018, 9:25 AM PGY-2, [facility]

## 2018-02-21 LAB — LIPID PANEL
CHOL/HDL RATIO: 4.7 ratio — AB (ref 0.0–4.4)
CHOLESTEROL TOTAL: 201 mg/dL — AB (ref 100–199)
HDL: 43 mg/dL (ref >39)
LDL CALC: 130 mg/dL — AB (ref 0–99)
TRIGLYCERIDES: 139 mg/dL (ref 0–149)
VLDL CHOLESTEROL CAL: 28 mg/dL (ref 5–40)

## 2018-02-26 ENCOUNTER — Encounter: Payer: Self-pay | Admitting: Family Medicine

## 2018-03-18 ENCOUNTER — Other Ambulatory Visit: Payer: Self-pay | Admitting: Family Medicine

## 2018-03-18 ENCOUNTER — Other Ambulatory Visit: Payer: Self-pay

## 2018-03-18 DIAGNOSIS — E559 Vitamin D deficiency, unspecified: Secondary | ICD-10-CM

## 2018-03-18 DIAGNOSIS — G8929 Other chronic pain: Secondary | ICD-10-CM

## 2018-03-18 DIAGNOSIS — M170 Bilateral primary osteoarthritis of knee: Secondary | ICD-10-CM

## 2018-03-19 MED ORDER — MELOXICAM 15 MG PO TABS: 15.0000 mg | ORAL_TABLET | Freq: Every day | ORAL | 0 refills | Status: DC

## 2018-03-19 MED ORDER — VITAMIN D (ERGOCALCIFEROL) 1.25 MG (50000 UNIT) PO CAPS: 50000.0000 [IU] | ORAL_CAPSULE | ORAL | 0 refills | Status: DC

## 2018-03-19 MED ORDER — HYDROCODONE-ACETAMINOPHEN 5-325 MG PO TABS: 1.0000 | ORAL_TABLET | Freq: Three times a day (TID) | ORAL | 0 refills | Status: DC | PRN

## 2018-04-13 ENCOUNTER — Encounter: Payer: Self-pay | Admitting: Family Medicine

## 2018-04-13 ENCOUNTER — Encounter: Payer: Self-pay | Admitting: Gastroenterology

## 2018-04-17 ENCOUNTER — Other Ambulatory Visit: Payer: Self-pay | Admitting: Family Medicine

## 2018-04-17 ENCOUNTER — Encounter: Payer: Self-pay | Admitting: Family Medicine

## 2018-04-17 DIAGNOSIS — G8929 Other chronic pain: Secondary | ICD-10-CM

## 2018-04-17 DIAGNOSIS — M170 Bilateral primary osteoarthritis of knee: Secondary | ICD-10-CM

## 2018-04-17 MED ORDER — HYDROCODONE-ACETAMINOPHEN 5-325 MG PO TABS: 1.0000 | ORAL_TABLET | Freq: Three times a day (TID) | ORAL | 0 refills | Status: DC | PRN

## 2018-04-17 MED ORDER — NYSTATIN-TRIAMCINOLONE 100000-0.1 UNIT/GM-% EX OINT: 1.0000 "application " | TOPICAL_OINTMENT | Freq: Two times a day (BID) | CUTANEOUS | 2 refills | Status: DC

## 2018-04-17 NOTE — Progress Notes (Signed)
Refilled Mycolog per patient request

## 2018-04-23 ENCOUNTER — Telehealth: Payer: Self-pay | Admitting: *Deleted

## 2018-04-23 NOTE — Telephone Encounter (Signed)
Called patient, no answer, left message for patient to call me back. She will need to reschedule her PV closer to the new appointment time.

## 2018-04-23 NOTE — Telephone Encounter (Signed)
I spoke with the triage nurses and they looked up our dr's schedules for the hospital. Luanna Cole made pt a [facility] colonoscopy on 06/09/2018.

## 2018-04-23 NOTE — Telephone Encounter (Signed)
I don't have any hospital time any time soon.  As she is new patient to practice, please check if any of the new providers have availability.

## 2018-04-23 NOTE — Telephone Encounter (Signed)
Patient is scheduled for a direct screening colonoscopy with you. On 02/20/2018 her weight was 415 lbs and BMI=63. Would you like the patient to have direct hospital colonoscopy or office visit first? Please advise. Thank you, Jhanae Jaskowiak pv

## 2018-04-24 ENCOUNTER — Telehealth: Payer: Self-pay

## 2018-04-24 NOTE — Telephone Encounter (Signed)
Call patient, no answer, left message to call back so that we could schedule her PV closer to her colonoscopy that is scheduled at [facility], on 06/09/18. BMI is 63. I am not sure if the patient is aware that she has been rescheduled for the hospital.   Janalee Dane, LPN ( PV )

## 2018-04-24 NOTE — Telephone Encounter (Signed)
Patient was called to reschedule her PV closer to the colonoscopy that is scheduled for [facility] 06/09/18. Patient was very angry and upset with our practice. Patient states that she did not know that she had an appointment and got a no show letter in my chart. Patient was requesting that it be removed because she was under the impression that if you know show twice you could not be seen at any [facility]. I told the patient that I did not see the letter that she was talking about.  I tried to reassure her that it would take more than two missed appointment's  for [place] not to see her as a patient. 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 I apologized for the miss understanding. Before hanging up Laquitta said that she would like to think about it over the weekend and she would call Monday 04/27/18 and either schedule PV or cancel the procedure. Hopefully, she will call on Monday.   Janalee Dane, LPN ( PV )

## 2018-05-06 ENCOUNTER — Telehealth: Payer: Self-pay

## 2018-05-06 NOTE — Telephone Encounter (Signed)
Thanks

## 2018-05-06 NOTE — Telephone Encounter (Signed)
Left message for patient to call back to the office;  

## 2018-05-06 NOTE — Telephone Encounter (Signed)
Called and spoke with patient (AT LENGTH)-patient reports she was spoken to rudely and she is unsure if she wants to be a patient with our practice-RN apologized to patient profusely (and quite often) and advised patient she had a right to take her care to whomever she wanted to have treatment from-patient encouraged that we, as a practice, would appreciate her business but understand if she chose not to have the office take care of her; patient advised to think about this situation over the next few days and to notify the office if she wishes to have her care through us-patient reports she will call the office and let us know what she has decided at some point during the next week as she would like to have her procedure at [facility] in the month of April (for which the patient was informed the schedule is not available to schedule procedures at [facility] in April at this time); patient verbalized understanding of instructions/information; patient advised to call back and speak with Dr. Urban Gibson nurse;

## 2018-05-06 NOTE — Telephone Encounter (Signed)
-----   Message from Lucia Gaskins, RN sent at 05/04/2018  1:20 PM EST ----- Patient cancelled her pre-visit and did not reschedule this. She is for a hospital colonoscopy with Chales Abrahams. There is a phone note from [place] because the patient was upset... I guess the colon should be cancelled since she did not reschedule the pre-visit unless you want to call her about this?? But see phone note first! Thanks Robbin pv

## 2018-05-07 ENCOUNTER — Other Ambulatory Visit: Payer: Self-pay

## 2018-05-07 ENCOUNTER — Ambulatory Visit (INDEPENDENT_AMBULATORY_CARE_PROVIDER_SITE_OTHER): Payer: BLUE CROSS/BLUE SHIELD | Admitting: Family Medicine

## 2018-05-07 VITALS — BP 118/80 | HR 83 | Temp 97.5°F | Wt >= 6400 oz

## 2018-05-07 DIAGNOSIS — R1011 Right upper quadrant pain: Secondary | ICD-10-CM

## 2018-05-07 LAB — POCT URINALYSIS DIP (MANUAL ENTRY)
Bilirubin, UA: NEGATIVE
Glucose, UA: NEGATIVE mg/dL
Ketones, POC UA: NEGATIVE mg/dL
Nitrite, UA: NEGATIVE
Protein Ur, POC: NEGATIVE mg/dL
RBC UA: NEGATIVE
Spec Grav, UA: 1.01 (ref 1.010–1.025)
Urobilinogen, UA: 0.2 E.U./dL
pH, UA: 6 (ref 5.0–8.0)

## 2018-05-07 NOTE — Progress Notes (Signed)
   Subjective   Patient ID: Catherine Powell    DOB: Mar 18, 1965, 53 y.o. female   MRN: [id number]  CC: "abdominal pain"  HPI: Catherine Powell is a 53 y.o. female who presents to clinic today for the following:  ABDOMINAL PAIN  Onset: 1 day Medications tried: none Similar pain before: no Prior abdominal surgeries: Cholecystectomy and appendectomy, C-section x1  Symptoms Nausea/vomiting: Single episode of nonbloody nonbilious emesis yesterday Diarrhea: No Constipation: No Blood in stool: No Blood in vomit: No Fever: No Dysuria: No Loss of appetite: No, had exam which earlier today Weight loss: No Vaginal Bleeding: No Missed menstrual period: No  Patient son recently diagnosed with a viral infection  ROS: see HPI for pertinent.  PMFSH: Reviewed. Smoking status reviewed. Medications reviewed.  Objective   BP 118/80   Pulse 83   Temp (!) 97.5 F (36.4 C) (Oral)   Wt (!) 418 lb (189.6 kg)   LMP 05/15/2012   SpO2 95%   BMI 63.56 kg/m  Vitals and nursing note reviewed.  General: well nourished, well developed, NAD with non-toxic appearance HEENT: normocephalic, atraumatic, moist mucous membranes Neck: supple, non-tender without lymphadenopathy Cardiovascular: regular rate and rhythm without murmurs, rubs, or gallops Lungs: clear to auscultation bilaterally with normal work of breathing Abdomen: significant obesity making exam difficult, soft, minimally tender to RUQ, normoactive bowel sounds, no palpable masses or hepatosplenomegaly Skin: warm, dry, no rashes or lesions, cap refill < 2 seconds Extremities: warm and well perfused, normal tone, no edema  Assessment & Plan   Right upper quadrant abdominal pain Acute.  Uncertain source.  Broad differential.  Given recent history of migraines, IBS is a consideration.  May be biliary in source despite cholecystectomy.  No recent liver studies in the last several years.  Given significant obesity, would be  concerning for hepato-steatosis.  Does have appendectomy and no signs of acute abdomen.  Would be an atypical presentation for UTI with absent urinary symptoms.  No history concerning for constipation.  Weight reassuring.  Clinically well on exam. - Checking basic labs including CBC with differential, CMET, and UA with microscopy - Reviewed return precautions, RTC 1 week or sooner if needed  Orders Placed This Encounter  Procedures  . CBC With Differential  . Comprehensive metabolic panel    Order Specific Question:   Has the patient fasted?    Answer:   No  . POCT urinalysis dipstick  . POCT UA - Microscopic Only   No orders of the defined types were placed in this encounter.   Durward Parcel, DO [facility], PGY-3 05/07/2018, 4:46 PM

## 2018-05-07 NOTE — Assessment & Plan Note (Signed)
Acute.  Uncertain source.  Broad differential.  Given recent history of migraines, IBS is a consideration.  May be biliary in source despite cholecystectomy.  No recent liver studies in the last several years.  Given significant obesity, would be concerning for hepato-steatosis.  Does have appendectomy and no signs of acute abdomen.  Would be an atypical presentation for UTI with absent urinary symptoms.  No history concerning for constipation.  Weight reassuring.  Clinically well on exam. - Checking basic labs including CBC with differential, CMET, and UA with microscopy - Reviewed return precautions, RTC 1 week or sooner if needed

## 2018-05-07 NOTE — Patient Instructions (Signed)
Thank you for coming in to see Catherine Powell today. Please see below to review our plan for today's visit.  I will call you within 48 hours if there is anything abnormal on your blood work, otherwise you will receive results in the mail.  If your vomiting becomes more persistent or your abdominal pain changes and becomes worse, call our clinic.  I would like to see you again next week to make sure your symptoms are improving.  Please call the clinic at [phone] if your symptoms worsen or you have any concerns. It was our pleasure to serve you.  Durward Parcel, DO [facility], PGY-3

## 2018-05-08 ENCOUNTER — Encounter: Payer: Self-pay | Admitting: Family Medicine

## 2018-05-08 LAB — CBC WITH DIFFERENTIAL
BASOS ABS: 0.1 10*3/uL (ref 0.0–0.2)
Basos: 1 %
EOS (ABSOLUTE): 0.2 10*3/uL (ref 0.0–0.4)
Eos: 2 %
Hematocrit: 42.1 % (ref 34.0–46.6)
Hemoglobin: 14.6 g/dL (ref 11.1–15.9)
Immature Grans (Abs): 0 10*3/uL (ref 0.0–0.1)
Immature Granulocytes: 0 %
Lymphocytes Absolute: 2.9 10*3/uL (ref 0.7–3.1)
Lymphs: 30 %
MCH: 28.7 pg (ref 26.6–33.0)
MCHC: 34.7 g/dL (ref 31.5–35.7)
MCV: 83 fL (ref 79–97)
Monocytes Absolute: 0.8 10*3/uL (ref 0.1–0.9)
Monocytes: 8 %
Neutrophils Absolute: 5.8 10*3/uL (ref 1.4–7.0)
Neutrophils: 59 %
RBC: 5.09 x10E6/uL (ref 3.77–5.28)
RDW: 12.8 % (ref 11.7–15.4)
WBC: 9.8 10*3/uL (ref 3.4–10.8)

## 2018-05-08 LAB — COMPREHENSIVE METABOLIC PANEL
ALK PHOS: 84 IU/L (ref 39–117)
ALT: 25 IU/L (ref 0–32)
AST: 22 IU/L (ref 0–40)
Albumin/Globulin Ratio: 1.3 (ref 1.2–2.2)
Albumin: 4 g/dL (ref 3.8–4.9)
BUN/Creatinine Ratio: 10 (ref 9–23)
BUN: 8 mg/dL (ref 6–24)
Bilirubin Total: 0.3 mg/dL (ref 0.0–1.2)
CO2: 23 mmol/L (ref 20–29)
Calcium: 9.4 mg/dL (ref 8.7–10.2)
Chloride: 101 mmol/L (ref 96–106)
Creatinine, Ser: 0.84 mg/dL (ref 0.57–1.00)
GFR calc Af Amer: 91 mL/min/{1.73_m2} (ref >59)
GFR calc non Af Amer: 79 mL/min/{1.73_m2} (ref >59)
Globulin, Total: 3.2 g/dL (ref 1.5–4.5)
Glucose: 81 mg/dL (ref 65–99)
Potassium: 3.8 mmol/L (ref 3.5–5.2)
Sodium: 139 mmol/L (ref 134–144)
Total Protein: 7.2 g/dL (ref 6.0–8.5)

## 2018-05-13 NOTE — Telephone Encounter (Signed)
Left message for patient to call back to the office;  

## 2018-05-14 NOTE — Telephone Encounter (Signed)
Patient has not called back to office to speak with RN; will release this note and await patient to call back to office for care;

## 2018-05-18 ENCOUNTER — Encounter: Payer: Managed Care, Other (non HMO) | Admitting: Gastroenterology

## 2018-05-19 ENCOUNTER — Encounter: Payer: Self-pay | Admitting: Family Medicine

## 2018-05-20 ENCOUNTER — Other Ambulatory Visit: Payer: Self-pay

## 2018-05-20 ENCOUNTER — Other Ambulatory Visit: Payer: Self-pay | Admitting: Family Medicine

## 2018-05-20 DIAGNOSIS — M170 Bilateral primary osteoarthritis of knee: Secondary | ICD-10-CM

## 2018-05-20 DIAGNOSIS — G43109 Migraine with aura, not intractable, without status migrainosus: Secondary | ICD-10-CM

## 2018-05-20 DIAGNOSIS — G8929 Other chronic pain: Secondary | ICD-10-CM

## 2018-05-20 MED ORDER — SUMATRIPTAN SUCCINATE 50 MG PO TABS: ORAL_TABLET | ORAL | 3 refills | Status: DC

## 2018-05-20 MED ORDER — TOPIRAMATE 25 MG PO TABS: 25.0000 mg | ORAL_TABLET | Freq: Every day | ORAL | 3 refills | Status: DC

## 2018-05-20 MED ORDER — HYDROCODONE-ACETAMINOPHEN 5-325 MG PO TABS: 1.0000 | ORAL_TABLET | Freq: Three times a day (TID) | ORAL | 0 refills | Status: DC | PRN

## 2018-05-20 NOTE — Telephone Encounter (Signed)
Patient left message that [facility] will not fill Norco as a mail order pharmacy. [facility]  Call back is [phone]  Ples Specter, RN [facility] RN)

## 2018-06-09 ENCOUNTER — Encounter (HOSPITAL_COMMUNITY): Payer: Self-pay

## 2018-06-09 ENCOUNTER — Ambulatory Visit (HOSPITAL_COMMUNITY): Admit: 2018-06-09 | Payer: Managed Care, Other (non HMO) | Admitting: Gastroenterology

## 2018-06-09 SURGERY — COLONOSCOPY WITH PROPOFOL
Anesthesia: Monitor Anesthesia Care

## 2018-06-12 ENCOUNTER — Other Ambulatory Visit: Payer: Self-pay | Admitting: Family Medicine

## 2018-06-12 ENCOUNTER — Other Ambulatory Visit: Payer: Self-pay

## 2018-06-12 ENCOUNTER — Telehealth: Payer: Self-pay | Admitting: Family Medicine

## 2018-06-12 ENCOUNTER — Encounter: Payer: Self-pay | Admitting: Family Medicine

## 2018-06-12 ENCOUNTER — Telehealth (INDEPENDENT_AMBULATORY_CARE_PROVIDER_SITE_OTHER): Payer: Managed Care, Other (non HMO) | Admitting: Family Medicine

## 2018-06-12 DIAGNOSIS — H60391 Other infective otitis externa, right ear: Secondary | ICD-10-CM

## 2018-06-12 DIAGNOSIS — H60399 Other infective otitis externa, unspecified ear: Secondary | ICD-10-CM | POA: Insufficient documentation

## 2018-06-12 MED ORDER — AMOXICILLIN 500 MG PO CAPS: 500.0000 mg | ORAL_CAPSULE | Freq: Three times a day (TID) | ORAL | 0 refills | Status: DC

## 2018-06-12 MED ORDER — NEOMYCIN-POLYMYXIN-HC 3.5-10000-1 OT SOLN: 3.0000 [drp] | Freq: Four times a day (QID) | OTIC | 0 refills | Status: DC

## 2018-06-12 NOTE — Progress Notes (Signed)
Ear pain x 24 hours Agrees to a phone note.   Allergies have have been very bad for two weeks.   Right ear.  +drainage Hearing in that ear is muffled. Not febrile or sick feeling - only pain in ear. States it hurts when she pulls on pinna and pushes on tragus. Hx of ear infections usually treated with pills - presumed otitis media. Duration of visit 10 minutes.

## 2018-06-12 NOTE — Assessment & Plan Note (Signed)
Definitely otitis externa given ear drainage and pain with manipulation.  Will also treat for otitis media given muffled hearing, congestion from allergies and hx of multiple ear infections.

## 2018-06-12 NOTE — Telephone Encounter (Signed)
**  After Hours/ Emergency Line Call*  Received call from patient around 4:00 am reporting stabbing ear pain radiating to her neck. Patient has been having these symptoms for the past few days but pain has worsened in the past 24 hours. No fevers reported. Patient thinks that she might have an ear infection. Discuss with patient that condition is not an emergency and told her to call this clinic this morning after 8:30 to talk to a telemedicine provider to decide if she needs to be seen in clinic or do they feel comfortable prescribing antibiotic over the phone. Patient verbalized understanding.    Lovena Neighbours, MD [facility], PGY-3

## 2018-06-15 ENCOUNTER — Other Ambulatory Visit: Payer: Self-pay | Admitting: Family Medicine

## 2018-06-15 DIAGNOSIS — I872 Venous insufficiency (chronic) (peripheral): Secondary | ICD-10-CM

## 2018-06-15 DIAGNOSIS — G43109 Migraine with aura, not intractable, without status migrainosus: Secondary | ICD-10-CM

## 2018-06-15 DIAGNOSIS — H60391 Other infective otitis externa, right ear: Secondary | ICD-10-CM

## 2018-06-15 DIAGNOSIS — J302 Other seasonal allergic rhinitis: Secondary | ICD-10-CM

## 2018-06-15 DIAGNOSIS — I10 Essential (primary) hypertension: Secondary | ICD-10-CM

## 2018-06-15 DIAGNOSIS — R110 Nausea: Secondary | ICD-10-CM

## 2018-06-15 DIAGNOSIS — E7800 Pure hypercholesterolemia, unspecified: Secondary | ICD-10-CM

## 2018-06-15 DIAGNOSIS — J4530 Mild persistent asthma, uncomplicated: Secondary | ICD-10-CM

## 2018-06-15 DIAGNOSIS — K219 Gastro-esophageal reflux disease without esophagitis: Secondary | ICD-10-CM

## 2018-06-15 DIAGNOSIS — E559 Vitamin D deficiency, unspecified: Secondary | ICD-10-CM

## 2018-06-15 MED ORDER — TRIAMCINOLONE ACETONIDE 0.5 % EX OINT: 1.0000 "application " | TOPICAL_OINTMENT | Freq: Two times a day (BID) | CUTANEOUS | 1 refills | Status: DC

## 2018-06-15 MED ORDER — ATORVASTATIN CALCIUM 40 MG PO TABS: 40.0000 mg | ORAL_TABLET | Freq: Every evening | ORAL | 3 refills | Status: DC

## 2018-06-15 MED ORDER — PROMETHAZINE HCL 25 MG PO TABS: 25.0000 mg | ORAL_TABLET | Freq: Three times a day (TID) | ORAL | 3 refills | Status: DC | PRN

## 2018-06-15 MED ORDER — MELATONIN 10 MG PO TABS: 10.0000 mg | ORAL_TABLET | Freq: Every day | ORAL | 3 refills | Status: DC

## 2018-06-15 MED ORDER — TORSEMIDE 20 MG PO TABS: 10.0000 mg | ORAL_TABLET | Freq: Every day | ORAL | 3 refills | Status: DC

## 2018-06-15 MED ORDER — QUINAPRIL-HYDROCHLOROTHIAZIDE 20-12.5 MG PO TABS: 1.0000 | ORAL_TABLET | Freq: Every day | ORAL | 3 refills | Status: DC

## 2018-06-15 MED ORDER — NYSTATIN-TRIAMCINOLONE 100000-0.1 UNIT/GM-% EX OINT: 1.0000 "application " | TOPICAL_OINTMENT | Freq: Two times a day (BID) | CUTANEOUS | 2 refills | Status: DC

## 2018-06-15 MED ORDER — ASPIRIN-ACETAMINOPHEN-CAFFEINE 250-250-65 MG PO TABS: 2.0000 | ORAL_TABLET | Freq: Four times a day (QID) | ORAL | 3 refills | Status: DC | PRN

## 2018-06-15 MED ORDER — ESOMEPRAZOLE MAGNESIUM 40 MG PO CPDR: 40.0000 mg | DELAYED_RELEASE_CAPSULE | Freq: Every day | ORAL | 5 refills | Status: DC

## 2018-06-15 MED ORDER — ALBUTEROL SULFATE HFA 108 (90 BASE) MCG/ACT IN AERS: 2.0000 | INHALATION_SPRAY | RESPIRATORY_TRACT | 3 refills | Status: DC | PRN

## 2018-06-15 MED ORDER — NEOMYCIN-POLYMYXIN-HC 3.5-10000-1 OT SOLN: 3.0000 [drp] | Freq: Four times a day (QID) | OTIC | 0 refills | Status: DC

## 2018-06-15 MED ORDER — MONTELUKAST SODIUM 10 MG PO TABS: 10.0000 mg | ORAL_TABLET | Freq: Every day | ORAL | 3 refills | Status: DC

## 2018-06-15 MED ORDER — OLOPATADINE HCL 0.1 % OP SOLN: 1.0000 [drp] | Freq: Two times a day (BID) | OPHTHALMIC | 3 refills | Status: DC | PRN

## 2018-06-15 MED ORDER — COENZYME Q10 30 MG PO CAPS: 30.0000 mg | ORAL_CAPSULE | Freq: Two times a day (BID) | ORAL | 3 refills | Status: DC

## 2018-06-15 MED ORDER — CHOLECALCIFEROL 25 MCG (1000 UT) PO CAPS: 1000.0000 [IU] | ORAL_CAPSULE | Freq: Every day | ORAL | 3 refills | Status: DC

## 2018-06-15 MED ORDER — SUMATRIPTAN SUCCINATE 50 MG PO TABS: ORAL_TABLET | ORAL | 3 refills | Status: DC

## 2018-06-15 MED ORDER — MULTIVITAMIN WOMEN 50+ PO TABS: 1.0000 | ORAL_TABLET | Freq: Every day | ORAL | 3 refills | Status: DC

## 2018-06-15 MED ORDER — BUDESONIDE-FORMOTEROL FUMARATE 160-4.5 MCG/ACT IN AERO: 1.0000 | INHALATION_SPRAY | Freq: Two times a day (BID) | RESPIRATORY_TRACT | 11 refills | Status: DC

## 2018-06-15 MED ORDER — TOPIRAMATE 25 MG PO TABS: 25.0000 mg | ORAL_TABLET | Freq: Every day | ORAL | 3 refills | Status: DC

## 2018-06-18 ENCOUNTER — Other Ambulatory Visit: Payer: Self-pay | Admitting: Family Medicine

## 2018-06-18 MED ORDER — TOPIRAMATE 25 MG PO TABS: 25.0000 mg | ORAL_TABLET | Freq: Two times a day (BID) | ORAL | 0 refills | Status: DC

## 2018-06-18 MED ORDER — RIBOFLAVIN-MAGNESIUM-FEVERFEW 100-90-25 MG PO TABS: 100.0000 mg | ORAL_TABLET | Freq: Every day | ORAL | 3 refills | Status: DC

## 2018-06-18 MED ORDER — PROPRANOLOL HCL 20 MG PO TABS: 20.0000 mg | ORAL_TABLET | Freq: Two times a day (BID) | ORAL | 3 refills | Status: DC

## 2018-06-19 ENCOUNTER — Other Ambulatory Visit: Payer: Self-pay | Admitting: Family Medicine

## 2018-06-19 DIAGNOSIS — G8929 Other chronic pain: Secondary | ICD-10-CM

## 2018-06-19 DIAGNOSIS — M170 Bilateral primary osteoarthritis of knee: Secondary | ICD-10-CM

## 2018-06-19 MED ORDER — HYDROCODONE-ACETAMINOPHEN 5-325 MG PO TABS: 1.0000 | ORAL_TABLET | Freq: Three times a day (TID) | ORAL | 0 refills | Status: DC | PRN

## 2018-06-20 ENCOUNTER — Encounter: Payer: Self-pay | Admitting: Family Medicine

## 2018-06-22 ENCOUNTER — Other Ambulatory Visit: Payer: Self-pay | Admitting: Family Medicine

## 2018-06-22 ENCOUNTER — Other Ambulatory Visit: Payer: Self-pay | Admitting: *Deleted

## 2018-06-22 DIAGNOSIS — E559 Vitamin D deficiency, unspecified: Secondary | ICD-10-CM

## 2018-06-22 DIAGNOSIS — J4530 Mild persistent asthma, uncomplicated: Secondary | ICD-10-CM

## 2018-06-22 DIAGNOSIS — I10 Essential (primary) hypertension: Secondary | ICD-10-CM

## 2018-06-22 DIAGNOSIS — I872 Venous insufficiency (chronic) (peripheral): Secondary | ICD-10-CM

## 2018-06-22 DIAGNOSIS — E7800 Pure hypercholesterolemia, unspecified: Secondary | ICD-10-CM

## 2018-06-22 DIAGNOSIS — R110 Nausea: Secondary | ICD-10-CM

## 2018-06-22 DIAGNOSIS — K219 Gastro-esophageal reflux disease without esophagitis: Secondary | ICD-10-CM

## 2018-06-22 DIAGNOSIS — G43109 Migraine with aura, not intractable, without status migrainosus: Secondary | ICD-10-CM

## 2018-06-22 DIAGNOSIS — J302 Other seasonal allergic rhinitis: Secondary | ICD-10-CM

## 2018-06-22 MED ORDER — RIBOFLAVIN-MAGNESIUM-FEVERFEW 100-90-25 MG PO TABS: 100.0000 mg | ORAL_TABLET | Freq: Every day | ORAL | 3 refills | Status: AC

## 2018-06-22 MED ORDER — TOPIRAMATE 25 MG PO TABS: 25.0000 mg | ORAL_TABLET | Freq: Two times a day (BID) | ORAL | 0 refills | Status: DC

## 2018-06-22 MED ORDER — OLOPATADINE HCL 0.1 % OP SOLN: 1.0000 [drp] | Freq: Two times a day (BID) | OPHTHALMIC | 3 refills | Status: DC | PRN

## 2018-06-22 MED ORDER — NYSTATIN-TRIAMCINOLONE 100000-0.1 UNIT/GM-% EX OINT: 1.0000 "application " | TOPICAL_OINTMENT | Freq: Two times a day (BID) | CUTANEOUS | 2 refills | Status: DC

## 2018-06-22 MED ORDER — ALBUTEROL SULFATE HFA 108 (90 BASE) MCG/ACT IN AERS: 2.0000 | INHALATION_SPRAY | RESPIRATORY_TRACT | 3 refills | Status: DC | PRN

## 2018-06-22 MED ORDER — SUMATRIPTAN SUCCINATE 50 MG PO TABS: ORAL_TABLET | ORAL | 3 refills | Status: DC

## 2018-06-22 MED ORDER — ESOMEPRAZOLE MAGNESIUM 40 MG PO CPDR: 40.0000 mg | DELAYED_RELEASE_CAPSULE | Freq: Every day | ORAL | 5 refills | Status: DC

## 2018-06-22 MED ORDER — MELATONIN 10 MG PO TABS: 10.0000 mg | ORAL_TABLET | Freq: Every day | ORAL | 3 refills | Status: AC

## 2018-06-22 MED ORDER — CHOLECALCIFEROL 25 MCG (1000 UT) PO CAPS: 1000.0000 [IU] | ORAL_CAPSULE | Freq: Every day | ORAL | 3 refills | Status: DC

## 2018-06-22 MED ORDER — TORSEMIDE 20 MG PO TABS: 10.0000 mg | ORAL_TABLET | Freq: Every day | ORAL | 3 refills | Status: DC

## 2018-06-22 MED ORDER — MONTELUKAST SODIUM 10 MG PO TABS: 10.0000 mg | ORAL_TABLET | Freq: Every day | ORAL | 3 refills | Status: DC

## 2018-06-22 MED ORDER — PROPRANOLOL HCL 20 MG PO TABS: 20.0000 mg | ORAL_TABLET | Freq: Two times a day (BID) | ORAL | 3 refills | Status: DC

## 2018-06-22 MED ORDER — COENZYME Q10 30 MG PO CAPS: 30.0000 mg | ORAL_CAPSULE | Freq: Two times a day (BID) | ORAL | 3 refills | Status: DC

## 2018-06-22 MED ORDER — ATORVASTATIN CALCIUM 40 MG PO TABS: 40.0000 mg | ORAL_TABLET | Freq: Every evening | ORAL | 3 refills | Status: DC

## 2018-06-22 MED ORDER — BUDESONIDE-FORMOTEROL FUMARATE 160-4.5 MCG/ACT IN AERO: 1.0000 | INHALATION_SPRAY | Freq: Two times a day (BID) | RESPIRATORY_TRACT | 11 refills | Status: DC

## 2018-06-22 MED ORDER — TRIAMCINOLONE ACETONIDE 0.5 % EX OINT: 1.0000 "application " | TOPICAL_OINTMENT | Freq: Two times a day (BID) | CUTANEOUS | 1 refills | Status: DC

## 2018-06-22 MED ORDER — ASPIRIN-ACETAMINOPHEN-CAFFEINE 250-250-65 MG PO TABS: 2.0000 | ORAL_TABLET | Freq: Four times a day (QID) | ORAL | 3 refills | Status: DC | PRN

## 2018-06-22 MED ORDER — VITAMIN D (ERGOCALCIFEROL) 1.25 MG (50000 UNIT) PO CAPS: 50000.0000 [IU] | ORAL_CAPSULE | ORAL | 0 refills | Status: DC

## 2018-06-22 MED ORDER — QUINAPRIL-HYDROCHLOROTHIAZIDE 20-12.5 MG PO TABS: 1.0000 | ORAL_TABLET | Freq: Every day | ORAL | 3 refills | Status: DC

## 2018-06-22 MED ORDER — PROMETHAZINE HCL 25 MG PO TABS: 25.0000 mg | ORAL_TABLET | Freq: Three times a day (TID) | ORAL | 3 refills | Status: DC | PRN

## 2018-07-07 ENCOUNTER — Other Ambulatory Visit: Payer: Self-pay

## 2018-07-07 DIAGNOSIS — E559 Vitamin D deficiency, unspecified: Secondary | ICD-10-CM

## 2018-07-07 MED ORDER — MULTIVITAMIN WOMEN 50+ PO TABS: 1.0000 | ORAL_TABLET | Freq: Every day | ORAL | 3 refills | Status: DC

## 2018-07-08 ENCOUNTER — Other Ambulatory Visit: Payer: Self-pay | Admitting: *Deleted

## 2018-07-08 DIAGNOSIS — E559 Vitamin D deficiency, unspecified: Secondary | ICD-10-CM

## 2018-07-08 MED ORDER — MULTIVITAMIN WOMEN 50+ PO TABS: 1.0000 | ORAL_TABLET | Freq: Every day | ORAL | 3 refills | Status: AC

## 2018-07-08 MED ORDER — VITAMIN D (ERGOCALCIFEROL) 1.25 MG (50000 UNIT) PO CAPS: 50000.0000 [IU] | ORAL_CAPSULE | ORAL | 0 refills | Status: DC

## 2018-07-08 NOTE — Telephone Encounter (Signed)
Pharmacy stated that pt wanted the vitamin sent in as a liquid centrium. And she would like to use her FSA. Deseree Bruna Potter, CMA

## 2018-07-10 ENCOUNTER — Encounter: Payer: Self-pay | Admitting: Family Medicine

## 2018-07-13 ENCOUNTER — Other Ambulatory Visit: Payer: Self-pay | Admitting: Family Medicine

## 2018-07-13 MED ORDER — TOPIRAMATE 25 MG PO TABS: 75.0000 mg | ORAL_TABLET | ORAL | 0 refills | Status: DC

## 2018-07-20 ENCOUNTER — Other Ambulatory Visit: Payer: Self-pay | Admitting: Family Medicine

## 2018-07-20 DIAGNOSIS — G8929 Other chronic pain: Secondary | ICD-10-CM

## 2018-07-20 DIAGNOSIS — M170 Bilateral primary osteoarthritis of knee: Secondary | ICD-10-CM

## 2018-07-21 MED ORDER — HYDROCODONE-ACETAMINOPHEN 5-325 MG PO TABS: 1.0000 | ORAL_TABLET | Freq: Three times a day (TID) | ORAL | 0 refills | Status: DC | PRN

## 2018-08-04 ENCOUNTER — Other Ambulatory Visit: Payer: Self-pay | Admitting: *Deleted

## 2018-08-04 DIAGNOSIS — E7800 Pure hypercholesterolemia, unspecified: Secondary | ICD-10-CM

## 2018-08-04 DIAGNOSIS — G43109 Migraine with aura, not intractable, without status migrainosus: Secondary | ICD-10-CM

## 2018-08-04 DIAGNOSIS — E559 Vitamin D deficiency, unspecified: Secondary | ICD-10-CM

## 2018-08-04 DIAGNOSIS — K219 Gastro-esophageal reflux disease without esophagitis: Secondary | ICD-10-CM

## 2018-08-04 MED ORDER — ESOMEPRAZOLE MAGNESIUM 40 MG PO CPDR: 40.0000 mg | DELAYED_RELEASE_CAPSULE | Freq: Every day | ORAL | 5 refills | Status: DC

## 2018-08-04 MED ORDER — VITAMIN D (ERGOCALCIFEROL) 1.25 MG (50000 UNIT) PO CAPS: 50000.0000 [IU] | ORAL_CAPSULE | ORAL | 0 refills | Status: DC

## 2018-08-04 MED ORDER — SUMATRIPTAN SUCCINATE 50 MG PO TABS: ORAL_TABLET | ORAL | 3 refills | Status: DC

## 2018-08-04 MED ORDER — ATORVASTATIN CALCIUM 40 MG PO TABS: 40.0000 mg | ORAL_TABLET | Freq: Every evening | ORAL | 3 refills | Status: DC

## 2018-08-07 ENCOUNTER — Encounter: Payer: Self-pay | Admitting: Family Medicine

## 2018-08-14 ENCOUNTER — Telehealth: Payer: Self-pay | Admitting: Family Medicine

## 2018-08-14 NOTE — Telephone Encounter (Signed)
FMLA form completed and placed in fax pile. 

## 2018-08-14 NOTE — Telephone Encounter (Signed)
Pt informed. Catherine Powell, CMA  

## 2018-08-21 ENCOUNTER — Other Ambulatory Visit: Payer: Self-pay | Admitting: Family Medicine

## 2018-08-21 DIAGNOSIS — M170 Bilateral primary osteoarthritis of knee: Secondary | ICD-10-CM

## 2018-08-21 DIAGNOSIS — G8929 Other chronic pain: Secondary | ICD-10-CM

## 2018-08-22 ENCOUNTER — Other Ambulatory Visit: Payer: Self-pay | Admitting: Family Medicine

## 2018-08-22 ENCOUNTER — Encounter: Payer: Self-pay | Admitting: Family Medicine

## 2018-08-22 DIAGNOSIS — M170 Bilateral primary osteoarthritis of knee: Secondary | ICD-10-CM

## 2018-08-22 DIAGNOSIS — G8929 Other chronic pain: Secondary | ICD-10-CM

## 2018-08-24 MED ORDER — HYDROCODONE-ACETAMINOPHEN 5-325 MG PO TABS: 1.0000 | ORAL_TABLET | Freq: Three times a day (TID) | ORAL | 0 refills | Status: DC | PRN

## 2018-09-25 ENCOUNTER — Other Ambulatory Visit: Payer: Self-pay | Admitting: Family Medicine

## 2018-09-25 DIAGNOSIS — M170 Bilateral primary osteoarthritis of knee: Secondary | ICD-10-CM

## 2018-09-25 DIAGNOSIS — G8929 Other chronic pain: Secondary | ICD-10-CM

## 2018-09-25 MED ORDER — HYDROCODONE-ACETAMINOPHEN 5-325 MG PO TABS: 1.0000 | ORAL_TABLET | Freq: Three times a day (TID) | ORAL | 0 refills | Status: DC | PRN

## 2018-10-06 ENCOUNTER — Ambulatory Visit (INDEPENDENT_AMBULATORY_CARE_PROVIDER_SITE_OTHER): Payer: BC Managed Care – PPO | Admitting: Family Medicine

## 2018-10-06 ENCOUNTER — Encounter: Payer: Self-pay | Admitting: Family Medicine

## 2018-10-06 ENCOUNTER — Other Ambulatory Visit: Payer: Self-pay

## 2018-10-06 DIAGNOSIS — H04123 Dry eye syndrome of bilateral lacrimal glands: Secondary | ICD-10-CM | POA: Insufficient documentation

## 2018-10-06 DIAGNOSIS — H6690 Otitis media, unspecified, unspecified ear: Secondary | ICD-10-CM | POA: Insufficient documentation

## 2018-10-06 DIAGNOSIS — H6503 Acute serous otitis media, bilateral: Secondary | ICD-10-CM | POA: Diagnosis not present

## 2018-10-06 DIAGNOSIS — G43109 Migraine with aura, not intractable, without status migrainosus: Secondary | ICD-10-CM | POA: Diagnosis not present

## 2018-10-06 DIAGNOSIS — Z6841 Body Mass Index (BMI) 40.0 and over, adult: Secondary | ICD-10-CM

## 2018-10-06 MED ORDER — PROPRANOLOL HCL 40 MG PO TABS: 80.0000 mg | ORAL_TABLET | Freq: Two times a day (BID) | ORAL | 3 refills | Status: DC

## 2018-10-06 MED ORDER — AMOXICILLIN-POT CLAVULANATE 875-125 MG PO TABS: 1.0000 | ORAL_TABLET | Freq: Two times a day (BID) | ORAL | 0 refills | Status: DC

## 2018-10-06 MED ORDER — TOPIRAMATE 25 MG PO TABS: 50.0000 mg | ORAL_TABLET | Freq: Two times a day (BID) | ORAL | 3 refills | Status: DC

## 2018-10-06 NOTE — Assessment & Plan Note (Signed)
Will increase patient's topiramate dose to 50 mg twice daily.  We will also increase patient's propranolol dose to 40 mg twice daily.  Patient may be a candidate for a monoclonal antibody medication such as Emgality in the future, but we should maximize her current maintenance therapy first.

## 2018-10-06 NOTE — Progress Notes (Signed)
Subjective:    Marcy SalvoBeverly A Hooker-Limpert - 53 y.o. female MRN [id number]  Date of birth: 12/23/1965  CC:  Marcy SalvoBeverly A Hooker-Tolosa is here for follow-up of migraines, ear pain, and weight loss.  HPI: Migraines Ms. Hooker-Ha reports that she used to get about 4 migraines per week but now gets 2 to 3/week with 1 being severe.  She continues to take topiramate 50 mg in the morning and 25 mg at night as well as propranolol 20 mg 3 times daily.  She also takes supplements and Imitrex for migraine termination.  She reports that she feels like her brain is "fuzzy" when she has a migraine, which is especially frustrating for her since she cannot work when this happens.  Ear pain Reports that she has bilateral ear pain daily as well as a feeling of fullness and itchiness.  She used Corticosporin drops after recent appointment when she was diagnosed with an ear infection, which helped temporarily, but it is worse now than it was then.  Obesity Patient says that she has gained about 15 pounds since staying home more due to COVID-19.  She says that she has always been overweight, but now that it is causing her increased knee pain and difficulties with walking, she knows that she needs to make a change.  She says that this is difficult because her husband does all the cooking and her husband and son do not like to eat healthy foods.  Her husband also does not like when she suggests that he give her smaller portion sizes.    Dry eyes Patient also reports that she is having dry eyes.  This has been going on for several weeks.  She plans on seeing an eye doctor but wanted to check with me first about this.  She denies having a dry mouth as well.  Health Maintenance:  Health Maintenance Due  Topic Date Due  . COLONOSCOPY  01/15/2014  . MAMMOGRAM  01/15/2017    -  reports that she has never smoked. She has never used smokeless tobacco. - Review of Systems: Per HPI. - Past Medical History: Patient  Active Problem List   Diagnosis Date Noted  . Otitis media 10/06/2018  . Dry eyes, bilateral 10/06/2018  . Otitis, externa, infective 06/12/2018  . Right upper quadrant abdominal pain 05/07/2018  . Healthcare maintenance 02/20/2018  . Prophylactic antibiotic 12/23/2017  . Acute right-sided low back pain without sciatica 09/03/2017  . Asthma, chronic, mild persistent, uncomplicated 09/03/2017  . Lymphedema 04/29/2017  . Candidiasis, intertrigo 12/05/2016  . Acute medial meniscus tear of right knee 06/15/2016  . Chronic venous insufficiency 06/27/2015  . Vitamin D deficiency 05/11/2014  . Encounter for chronic pain management 05/11/2014  . Degenerative joint disease of knees 05/01/2010  . Cervical radiculopathy 08/06/2007  . Migraine 11/19/2006  . Essential hypertension 05/22/2006  . HYPERCHOLESTEROLEMIA 05/08/2006  . OBESITY, NOS 05/08/2006  . Allergic rhinitis 05/08/2006  . GASTROESOPHAGEAL REFLUX, NO ESOPHAGITIS 05/08/2006   - Medications: reviewed and updated   Objective:   Physical Exam BP 118/70   Pulse 82   Wt (!) 434 lb 3.2 oz (197 kg)   LMP 05/15/2012   SpO2 97%   BMI 66.02 kg/m  Gen: NAD, alert, cooperative with exam, appears tired, morbidly obese with slow ambulation HEENT: NCAT, PERRL, mildly injected conjunctivae, TMs bulging with fluid visualized in bilateral middle ears CV: RRR, good S1/S2  Resp: CTABL, no wheezes, non-labored Skin: no rashes, normal turgor  Neuro: no gross  deficits.  Psych: good insight, alert and oriented        Assessment & Plan:   Migraine Will increase patient's topiramate dose to 50 mg twice daily.  We will also increase patient's propranolol dose to 40 mg twice daily.  Patient may be a candidate for a monoclonal antibody medication such as Emgality in the future, but we should maximize her current maintenance therapy first.  OBESITY, NOS Agreed with patient that she needs to take action on her weight since it has reduced her  ability to walk without pain and will likely continue to cause her difficulties in the future.  She does seem motivated to reach out to our nutritionist, Dr. Jenne Campus, and I suggested that she and her husband both meet with her over a telemedicine appointment.  I gave her Dr. Dionisio Paschal card and encouraged her to call her to make an appointment.  She may be a good candidate for bariatric surgery in the future if she desires.  Also, will need to evaluate whether she should continue her torsemide, since it may have been given for venous insufficiency due to her obesity.  We will discuss this at her next appointment.  Otitis media Since patient's ear pain has not improved with Corticosporin drops and she has bulging tympanic membranes bilaterally with effusions, will give her a prescription for Augmentin twice daily for 7 days.  Dry eyes, bilateral Will start artificial tears as a first conservative option, but patient may need to see an eye doctor soon to have this evaluated more in detail.    Maia Breslow, M.D. 10/06/2018, 1:33 PM PGY-3, [place]

## 2018-10-06 NOTE — Assessment & Plan Note (Signed)
Agreed with patient that she needs to take action on her weight since it has reduced her ability to walk without pain and will likely continue to cause her difficulties in the future.  She does seem motivated to reach out to our nutritionist, Dr. Jenne Campus, and I suggested that she and her husband both meet with her over a telemedicine appointment.  I gave her Dr. Dionisio Paschal card and encouraged her to call her to make an appointment.  She may be a good candidate for bariatric surgery in the future if she desires.  Also, will need to evaluate whether she should continue her torsemide, since it may have been given for venous insufficiency due to her obesity.  We will discuss this at her next appointment.

## 2018-10-06 NOTE — Patient Instructions (Signed)
It was nice seeing you today Ms. Hooker-Allie!  For your migraines, we are increasing your topiramate dose to 2 pills twice daily and your propranolol dose to 2 pills twice daily.  We can increase your topiramate more in the future if needed.  I have sent these to [facility].  For your dry eyes, try artificial tears.  If you continue to have this issue, you will likely need to see an eye doctor.  It looks like your infection is still going on in your ears.  I am giving you an antibiotic called Augmentin that you should take twice daily for 7 days.  Sometimes this medication can cause diarrhea, so if this becomes a problem, please let me know.  I am giving you Dr. Jenne Campus, our nutritionist's card, please call her and see if she can set up a telemedicine visit with you and your husband.  Hopefully this will help you all make changes to your diet and help you with weight loss.  If you have any questions or concerns, please feel free to call the clinic.   Be well,  Dr. Shan Levans

## 2018-10-06 NOTE — Assessment & Plan Note (Signed)
Since patient's ear pain has not improved with Corticosporin drops and she has bulging tympanic membranes bilaterally with effusions, will give her a prescription for Augmentin twice daily for 7 days.

## 2018-10-06 NOTE — Assessment & Plan Note (Signed)
Will start artificial tears as a first conservative option, but patient may need to see an eye doctor soon to have this evaluated more in detail.

## 2018-10-08 ENCOUNTER — Other Ambulatory Visit: Payer: Self-pay

## 2018-10-08 DIAGNOSIS — J4530 Mild persistent asthma, uncomplicated: Secondary | ICD-10-CM

## 2018-10-08 MED ORDER — QUINAPRIL-HYDROCHLOROTHIAZIDE 20-12.5 MG PO TABS: 1.0000 | ORAL_TABLET | Freq: Every day | ORAL | 4 refills | Status: DC

## 2018-10-08 MED ORDER — ALBUTEROL SULFATE HFA 108 (90 BASE) MCG/ACT IN AERS: 2.0000 | INHALATION_SPRAY | RESPIRATORY_TRACT | 3 refills | Status: DC | PRN

## 2018-10-11 ENCOUNTER — Encounter: Payer: Self-pay | Admitting: Family Medicine

## 2018-10-13 ENCOUNTER — Other Ambulatory Visit: Payer: Self-pay | Admitting: Family Medicine

## 2018-10-13 ENCOUNTER — Telehealth: Payer: Self-pay | Admitting: *Deleted

## 2018-10-13 MED ORDER — TOPIRAMATE 25 MG PO TABS: 50.0000 mg | ORAL_TABLET | Freq: Two times a day (BID) | ORAL | 3 refills | Status: DC

## 2018-10-13 NOTE — Telephone Encounter (Signed)
Received call from [facility], they need clarification on topamax.  The one that was sent in has 2 directions.  Please advised. Christen Bame, CMA

## 2018-10-13 NOTE — Telephone Encounter (Signed)
The prescription was fixed.  I called [facility] and made them aware of this change.  They will let me know if they have any other issues.

## 2018-10-20 ENCOUNTER — Other Ambulatory Visit: Payer: Self-pay | Admitting: Family Medicine

## 2018-10-20 ENCOUNTER — Encounter: Payer: Self-pay | Admitting: Family Medicine

## 2018-10-20 DIAGNOSIS — G8929 Other chronic pain: Secondary | ICD-10-CM

## 2018-10-20 DIAGNOSIS — M170 Bilateral primary osteoarthritis of knee: Secondary | ICD-10-CM

## 2018-10-20 MED ORDER — FLUCONAZOLE 150 MG PO TABS: ORAL_TABLET | ORAL | 0 refills | Status: DC

## 2018-10-20 MED ORDER — HYDROCODONE-ACETAMINOPHEN 5-325 MG PO TABS: 1.0000 | ORAL_TABLET | Freq: Three times a day (TID) | ORAL | 0 refills | Status: DC | PRN

## 2018-11-04 ENCOUNTER — Other Ambulatory Visit: Payer: Self-pay | Admitting: *Deleted

## 2018-11-04 ENCOUNTER — Encounter: Payer: Self-pay | Admitting: Family Medicine

## 2018-11-04 MED ORDER — FLUCONAZOLE 150 MG PO TABS: ORAL_TABLET | ORAL | 0 refills | Status: DC

## 2018-11-06 ENCOUNTER — Encounter: Payer: Self-pay | Admitting: Family Medicine

## 2018-11-26 ENCOUNTER — Other Ambulatory Visit: Payer: Self-pay | Admitting: Family Medicine

## 2018-11-26 DIAGNOSIS — G8929 Other chronic pain: Secondary | ICD-10-CM

## 2018-11-26 DIAGNOSIS — M170 Bilateral primary osteoarthritis of knee: Secondary | ICD-10-CM

## 2018-11-27 MED ORDER — HYDROCODONE-ACETAMINOPHEN 5-325 MG PO TABS: 1.0000 | ORAL_TABLET | Freq: Three times a day (TID) | ORAL | 0 refills | Status: DC | PRN

## 2018-12-08 ENCOUNTER — Encounter: Payer: Self-pay | Admitting: Family Medicine

## 2018-12-21 ENCOUNTER — Ambulatory Visit: Payer: BC Managed Care – PPO | Admitting: Family Medicine

## 2018-12-21 ENCOUNTER — Other Ambulatory Visit: Payer: Self-pay | Admitting: Family Medicine

## 2018-12-21 DIAGNOSIS — M170 Bilateral primary osteoarthritis of knee: Secondary | ICD-10-CM

## 2018-12-21 DIAGNOSIS — G8929 Other chronic pain: Secondary | ICD-10-CM

## 2018-12-21 MED ORDER — HYDROCODONE-ACETAMINOPHEN 5-325 MG PO TABS: 1.0000 | ORAL_TABLET | Freq: Three times a day (TID) | ORAL | 0 refills | Status: DC | PRN

## 2018-12-21 MED ORDER — FLUCONAZOLE 150 MG PO TABS: ORAL_TABLET | ORAL | 0 refills | Status: DC

## 2018-12-24 ENCOUNTER — Other Ambulatory Visit: Payer: Self-pay

## 2018-12-24 ENCOUNTER — Encounter: Payer: Self-pay | Admitting: Family Medicine

## 2018-12-24 ENCOUNTER — Ambulatory Visit (INDEPENDENT_AMBULATORY_CARE_PROVIDER_SITE_OTHER): Payer: BC Managed Care – PPO | Admitting: Family Medicine

## 2018-12-24 ENCOUNTER — Telehealth: Payer: Self-pay | Admitting: Family Medicine

## 2018-12-24 VITALS — BP 120/64 | HR 91 | Ht 68.0 in | Wt >= 6400 oz

## 2018-12-24 DIAGNOSIS — I890 Lymphedema, not elsewhere classified: Secondary | ICD-10-CM | POA: Diagnosis not present

## 2018-12-24 DIAGNOSIS — Z23 Encounter for immunization: Secondary | ICD-10-CM

## 2018-12-24 DIAGNOSIS — I10 Essential (primary) hypertension: Secondary | ICD-10-CM

## 2018-12-24 MED ORDER — QUINAPRIL HCL 20 MG PO TABS: 20.0000 mg | ORAL_TABLET | Freq: Every day | ORAL | 3 refills | Status: DC

## 2018-12-24 NOTE — Progress Notes (Signed)
Subjective:    Catherine Powell - 53 y.o. female MRN [id number]  Date of birth: 1966/02/28  CC:  Catherine Powell is here for follow-up of her recent tooth extraction and lower extremity edema.  HPI: Recent tooth extraction Has had 5 teeth extracted on September 30, which was a very painful and frustrating experience for her.  She did not have adequate pain control and is just now able to open her mouth and chew like normal.  She was also not expecting to have this many teeth pulled when she went to the dentist.  She needs FMLA filled out from September 29 through October 5 due to being out of work for this procedure.  Bilateral lower extremity edema Due to patient's obesity, she has had increased leg swelling for a long time, but it seemed to worsen around the time of her tooth extraction.  She is unsure if maybe this was due to the stress that her body was under at that time.  She takes torsemide only as needed, but did take it once per day while she was having exacerbation of her edema.  She is no longer taking it every day.  Her swelling does seem to improve when she can elevate her legs.  She has not taken meloxicam in several months.  She has had no skin breakdown.  She says that her weight did not used to bother her since she felt good, but now, her obesity has made movement very painful and is causing side effects that she did not used to have, she would be interested in bariatric surgery, although she says that she was referred before and insurance would not cover it.  She would also be interested in compression stockings or other interventions to help with her edema.  Health Maintenance:  - Health Maintenance Due  Topic Date Due  . COLONOSCOPY  01/15/2014  . MAMMOGRAM  01/15/2017    -  reports that she has never smoked. She has never used smokeless tobacco. - Review of Systems: Per HPI. - Past Medical History: Patient Active Problem List   Diagnosis Date Noted   . Otitis media 10/06/2018  . Dry eyes, bilateral 10/06/2018  . Otitis, externa, infective 06/12/2018  . Acute right-sided low back pain without sciatica 09/03/2017  . Asthma, chronic, mild persistent, uncomplicated 09/03/2017  . Lymphedema 04/29/2017  . Candidiasis, intertrigo 12/05/2016  . Acute medial meniscus tear of right knee 06/15/2016  . Chronic venous insufficiency 06/27/2015  . Vitamin D deficiency 05/11/2014  . Encounter for chronic pain management 05/11/2014  . Degenerative joint disease of knees 05/01/2010  . Cervical radiculopathy 08/06/2007  . Migraine 11/19/2006  . Essential hypertension 05/22/2006  . HYPERCHOLESTEROLEMIA 05/08/2006  . Morbid obesity (HCC) 05/08/2006  . Allergic rhinitis 05/08/2006  . GASTROESOPHAGEAL REFLUX, NO ESOPHAGITIS 05/08/2006   - Medications: reviewed and updated   Objective:   Physical Exam BP 120/64   Pulse 91   Ht 5\' 8"  (1.727 m)   Wt (!) 428 lb 2 oz (194.2 kg)   LMP 05/15/2012   SpO2 98%   BMI 65.10 kg/m  Gen: NAD, alert, cooperative with exam, morbidly obese, pleasant HEENT: Sites of tooth extractions without swelling, bleeding, or signs of infection CV: RRR, good S1/S2, no murmur  Resp: CTABL, no wheezes, non-labored Extremities: Bilateral lower extremities very large but in proportion to patient's body habitus.  Nonpitting edema present bilaterally. Psych: good insight, alert and oriented, appears slightly anxious and becomes tearful when discussing  her teeth extraction        Assessment & Plan:   Essential hypertension Since patient is currently on 2 diuretics, will change patient's quinapril-HCTZ to quinapril alone.  Patient was instructed to take torsemide once daily if needed.  We will check a BMP today.  Morbid obesity ([place]) Patient is interested in bariatric surgery, and I think that she would be a good candidate since she is highly motivated, is very organized and responsible regarding her health care, and is  suffering from the effects of her obesity.  She has intentionally lost 6 pounds since her last visit in July.  I will place a referral for bariatric surgery today and will work on getting this approved if needed.  Encouraged patient to continue with her weight loss efforts.  Lymphedema Likely as a result of patient's morbid obesity.  Last TSH was in 2018 and was within normal limits, although we can always consider checking this in the future.  We will check a CBC and BMP today.  Will work on getting patient medical grade compression stockings and a referral to the lymphedema clinic.    Maia Breslow, M.D. 12/26/2018, 9:17 AM PGY-3, [place]

## 2018-12-24 NOTE — Patient Instructions (Addendum)
It was nice seeing you today Catherine Powell!  We are changing your blood pressure medication to no longer contain HCTZ.  It is the same medication (quinapril) as it was before at the same dose.  We are getting some labs today to look into your swelling.  I will also let you know what the next steps are for giving you compression stockings.  Keep your feet elevated as much as you can.  I will complete your FMLA paperwork during the coming week.  I will also look into bariatric surgery for you.  I will plan on seeing you in about 2 months.  If you have any questions or concerns, please feel free to call the clinic.   Be well,  Catherine Powell

## 2018-12-24 NOTE — Telephone Encounter (Signed)
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  Verified that patient section of form has been completed.  Last DOS/WCC with PCP was 12/24/2018.  Placed form in team folder to be completed by clinical staff.  [name]

## 2018-12-25 ENCOUNTER — Other Ambulatory Visit: Payer: Self-pay | Admitting: Family Medicine

## 2018-12-25 LAB — BASIC METABOLIC PANEL
BUN/Creatinine Ratio: 11 (ref 9–23)
BUN: 9 mg/dL (ref 6–24)
CO2: 24 mmol/L (ref 20–29)
Calcium: 9 mg/dL (ref 8.7–10.2)
Chloride: 99 mmol/L (ref 96–106)
Creatinine, Ser: 0.8 mg/dL (ref 0.57–1.00)
GFR calc Af Amer: 97 mL/min/{1.73_m2} (ref >59)
GFR calc non Af Amer: 84 mL/min/{1.73_m2} (ref >59)
Glucose: 90 mg/dL (ref 65–99)
Potassium: 4.6 mmol/L (ref 3.5–5.2)
Sodium: 138 mmol/L (ref 134–144)

## 2018-12-25 LAB — CBC
Hematocrit: 43.5 % (ref 34.0–46.6)
Hemoglobin: 14 g/dL (ref 11.1–15.9)
MCH: 27.9 pg (ref 26.6–33.0)
MCHC: 32.2 g/dL (ref 31.5–35.7)
MCV: 87 fL (ref 79–97)
Platelets: 303 10*3/uL (ref 150–450)
RBC: 5.02 x10E6/uL (ref 3.77–5.28)
RDW: 13.2 % (ref 11.7–15.4)
WBC: 9 10*3/uL (ref 3.4–10.8)

## 2018-12-25 NOTE — Telephone Encounter (Signed)
Clinical info completed on FMLA form.  Place form in Dr. Winfrey's box for completion.  Shanele Nissan Zimmerman Rumple, CMA  

## 2018-12-26 ENCOUNTER — Encounter: Payer: Self-pay | Admitting: Family Medicine

## 2018-12-26 NOTE — Assessment & Plan Note (Signed)
Since patient is currently on 2 diuretics, will change patient's quinapril-HCTZ to quinapril alone.  Patient was instructed to take torsemide once daily if needed.  We will check a BMP today.

## 2018-12-26 NOTE — Assessment & Plan Note (Signed)
Likely as a result of patient's morbid obesity.  Last TSH was in 2018 and was within normal limits, although we can always consider checking this in the future.  We will check a CBC and BMP today.  Will work on getting patient medical grade compression stockings and a referral to the lymphedema clinic.

## 2018-12-26 NOTE — Assessment & Plan Note (Addendum)
Patient is interested in bariatric surgery, and I think that she would be a good candidate since she is highly motivated, is very organized and responsible regarding her health care, and is suffering from the effects of her obesity.  She has intentionally lost 6 pounds since her last visit in July.  I will place a referral for bariatric surgery today and will work on getting this approved if needed.  Encouraged patient to continue with her weight loss efforts.

## 2018-12-30 ENCOUNTER — Other Ambulatory Visit: Payer: Self-pay | Admitting: Family Medicine

## 2018-12-30 DIAGNOSIS — I890 Lymphedema, not elsewhere classified: Secondary | ICD-10-CM

## 2018-12-30 NOTE — Telephone Encounter (Signed)
FMLA completed and placed in nurse folder.

## 2018-12-31 NOTE — Telephone Encounter (Signed)
Called patient to inform of form ready for pick up. Patient requested that I fax from to her employer. I have done this and also left her copy up front and made a copy for batch scanning.

## 2019-01-04 ENCOUNTER — Encounter: Payer: Self-pay | Admitting: Family Medicine

## 2019-01-08 ENCOUNTER — Other Ambulatory Visit: Payer: Self-pay | Admitting: Family Medicine

## 2019-01-08 DIAGNOSIS — E559 Vitamin D deficiency, unspecified: Secondary | ICD-10-CM

## 2019-01-10 ENCOUNTER — Encounter: Payer: Self-pay | Admitting: Family Medicine

## 2019-01-12 ENCOUNTER — Telehealth: Payer: Self-pay | Admitting: Family Medicine

## 2019-01-12 NOTE — Telephone Encounter (Signed)
I have rechecked the fax pile from dates October 19 through October 24 and looked through the paperwork in need to be scanned piles have not found this patient's FMLA paperwork.  It looks like a copy was made for batch scanning on 10/22 and it was faxed on this date as well.  We will need to either find this form or request a new one so that the corrected dates can be completed.  I appreciate your help with this.

## 2019-01-13 NOTE — Telephone Encounter (Signed)
Thank you for helping me locate the paperwork.  I've corrected the dates and placed the form in the fax pile.

## 2019-01-13 NOTE — Telephone Encounter (Signed)
Checked with Jerene Pitch about how long it takes to get in chart once scanned and she said she had some paper set to go out today and this paperwork was in the batch.  The paper work has been placed in PCP box so that she can complete/change what she needs to. April Zimmerman Rumple, CMA

## 2019-01-14 ENCOUNTER — Encounter: Payer: Self-pay | Admitting: Family Medicine

## 2019-01-18 NOTE — Telephone Encounter (Signed)
FMLA form completed and placed in fax pile. 

## 2019-01-20 ENCOUNTER — Other Ambulatory Visit: Payer: Self-pay | Admitting: Family Medicine

## 2019-01-20 DIAGNOSIS — M170 Bilateral primary osteoarthritis of knee: Secondary | ICD-10-CM

## 2019-01-20 DIAGNOSIS — G8929 Other chronic pain: Secondary | ICD-10-CM

## 2019-01-20 MED ORDER — HYDROCODONE-ACETAMINOPHEN 5-325 MG PO TABS: 1.0000 | ORAL_TABLET | Freq: Three times a day (TID) | ORAL | 0 refills | Status: DC | PRN

## 2019-01-28 ENCOUNTER — Other Ambulatory Visit: Payer: Self-pay | Admitting: Family Medicine

## 2019-01-28 DIAGNOSIS — H60391 Other infective otitis externa, right ear: Secondary | ICD-10-CM

## 2019-01-29 MED ORDER — NEOMYCIN-POLYMYXIN-HC 3.5-10000-1 OT SOLN: 3.0000 [drp] | Freq: Four times a day (QID) | OTIC | 0 refills | Status: DC

## 2019-02-17 ENCOUNTER — Other Ambulatory Visit: Payer: Self-pay | Admitting: Family Medicine

## 2019-02-17 DIAGNOSIS — M170 Bilateral primary osteoarthritis of knee: Secondary | ICD-10-CM

## 2019-02-17 DIAGNOSIS — G8929 Other chronic pain: Secondary | ICD-10-CM

## 2019-02-18 MED ORDER — HYDROCODONE-ACETAMINOPHEN 5-325 MG PO TABS: 1.0000 | ORAL_TABLET | Freq: Three times a day (TID) | ORAL | 0 refills | Status: DC | PRN

## 2019-03-19 ENCOUNTER — Other Ambulatory Visit: Payer: Self-pay | Admitting: Family Medicine

## 2019-03-19 DIAGNOSIS — G8929 Other chronic pain: Secondary | ICD-10-CM

## 2019-03-19 DIAGNOSIS — M170 Bilateral primary osteoarthritis of knee: Secondary | ICD-10-CM

## 2019-03-20 ENCOUNTER — Other Ambulatory Visit: Payer: Self-pay | Admitting: Family Medicine

## 2019-03-20 ENCOUNTER — Encounter: Payer: Self-pay | Admitting: Family Medicine

## 2019-03-20 DIAGNOSIS — G8929 Other chronic pain: Secondary | ICD-10-CM

## 2019-03-20 DIAGNOSIS — M170 Bilateral primary osteoarthritis of knee: Secondary | ICD-10-CM

## 2019-03-22 MED ORDER — HYDROCODONE-ACETAMINOPHEN 5-325 MG PO TABS: 1.0000 | ORAL_TABLET | Freq: Three times a day (TID) | ORAL | 0 refills | Status: DC | PRN

## 2019-03-31 ENCOUNTER — Telehealth: Payer: Self-pay | Admitting: Family Medicine

## 2019-03-31 NOTE — Telephone Encounter (Signed)
**  After Hours/ Emergency Line Call**  Received a call to report that Catherine Powell for concern of urine infection.   Patient reports she started to have a migraine Monday evening. Yesterday evening she started to feel "hot and cold" which continues today. Also endorses nausea. Urine is a "pink color". Wants an appointment tomorrow. Reports some back pain on the right and subjective fevers. Does endorse frequency. Patient reports being able to tolerate PO well. Denies SOB or CP. I recommended patient be seen in clinic. Was able to schedule ATC appointment at 10:50AM. Advised if worsening to go to ED. Red flags discussed.  Will forward to PCP who will see patient in AM.  Oralia Manis, DO PGY-3, [facility] 03/31/2019 7:49 PM

## 2019-04-01 ENCOUNTER — Ambulatory Visit (INDEPENDENT_AMBULATORY_CARE_PROVIDER_SITE_OTHER): Payer: BC Managed Care – PPO | Admitting: Family Medicine

## 2019-04-01 ENCOUNTER — Other Ambulatory Visit: Payer: Self-pay

## 2019-04-01 VITALS — BP 132/76 | HR 95 | Temp 98.6°F | Wt >= 6400 oz

## 2019-04-01 DIAGNOSIS — R0602 Shortness of breath: Secondary | ICD-10-CM | POA: Diagnosis not present

## 2019-04-01 DIAGNOSIS — N3001 Acute cystitis with hematuria: Secondary | ICD-10-CM | POA: Diagnosis not present

## 2019-04-01 DIAGNOSIS — N3000 Acute cystitis without hematuria: Secondary | ICD-10-CM | POA: Diagnosis not present

## 2019-04-01 LAB — POCT URINALYSIS DIP (MANUAL ENTRY)
Bilirubin, UA: NEGATIVE
Glucose, UA: NEGATIVE mg/dL
Ketones, POC UA: NEGATIVE mg/dL
Nitrite, UA: NEGATIVE
Protein Ur, POC: 30 mg/dL — AB
Spec Grav, UA: 1.025 (ref 1.010–1.025)
Urobilinogen, UA: 2 E.U./dL — AB
pH, UA: 5.5 (ref 5.0–8.0)

## 2019-04-01 LAB — POCT UA - MICROSCOPIC ONLY

## 2019-04-01 MED ORDER — CEPHALEXIN 500 MG PO CAPS: 500.0000 mg | ORAL_CAPSULE | Freq: Four times a day (QID) | ORAL | 0 refills | Status: DC

## 2019-04-01 MED ORDER — CEFTRIAXONE SODIUM 1 G IJ SOLR
1.0000 g | Freq: Once | INTRAMUSCULAR | Status: AC
  Administered 2019-04-01: 12:00:00 1 g via INTRAMUSCULAR

## 2019-04-01 NOTE — Progress Notes (Signed)
   Subjective:    Catherine Powell - 54 y.o. female MRN [id number]  Date of birth: [birth year]  CC:  Catherine Powell is here for concern for a UTI.  HPI: Symptoms started on Monday with a headache Increased frequency, hesitancy, and urgency on Tuesday, no dysuria Fever of 103.1 Tuesday night, accompanied by chills, has been taking Tylenol throughout the day and took Tylenol this morning Mild right sided back pain Feels like a previous urinary tract infection Had cloudy, pink urine with a strong odor Feeling fatigued Has had some nausea which has prevented her from drinking or eating as much No vomiting No sore throat, cough, or congestion  Shortness of breath in clinic today, thinks it is due to the mask, did not take symbicort this morning.  Is not concerned about a possible Covid infection or exacerbation of her asthma.  Denies sick contacts.  Health Maintenance:  Health Maintenance Due  Topic Date Due  . COLONOSCOPY  01/15/2014  . MAMMOGRAM  01/15/2017    -  reports that she has never smoked. She has never used smokeless tobacco. - Review of Systems: Per HPI. - Past Medical History: Patient Active Problem List   Diagnosis Date Noted  . Acute cystitis without hematuria 04/02/2019  . Shortness of breath 04/02/2019  . Otitis media 10/06/2018  . Dry eyes, bilateral 10/06/2018  . Otitis, externa, infective 06/12/2018  . Acute right-sided low back pain without sciatica 09/03/2017  . Asthma, chronic, mild persistent, uncomplicated 09/03/2017  . Lymphedema 04/29/2017  . Candidiasis, intertrigo 12/05/2016  . Acute medial meniscus tear of right knee 06/15/2016  . Chronic venous insufficiency 06/27/2015  . Vitamin D deficiency 05/11/2014  . Encounter for chronic pain management 05/11/2014  . Degenerative joint disease of knees 05/01/2010  . Cervical radiculopathy 08/06/2007  . Migraine 11/19/2006  . Essential hypertension 05/22/2006  . HYPERCHOLESTEROLEMIA  05/08/2006  . Morbid obesity (HCC) 05/08/2006  . Allergic rhinitis 05/08/2006  . GASTROESOPHAGEAL REFLUX, NO ESOPHAGITIS 05/08/2006   - Medications: reviewed and updated   Objective:   Physical Exam BP 132/76   Pulse 95   Temp 98.6 F (37 C) (Oral)   Wt (!) 428 lb (194.1 kg)   LMP 05/15/2012   SpO2 96%   BMI 65.08 kg/m  Gen: NAD, alert, cooperative with exam, appears fatigued CV: RRR, good S1/S2, no murmur Resp: CTABL, no wheezes, breathing is mildly labored Abd: SNTND, no guarding or organomegaly, no suprapubic tenderness, mild right CVA tenderness    Assessment & Plan:   Acute cystitis without hematuria Patient likely has a UTI given her UA results and history.  Reassuring that urine microscopy does not show any RBCs.  We will obtain a urine culture and give patient ceftriaxone 1 g IM today due to the severity of her symptoms and her mild CVA tenderness.  We will continue therapy with Keflex 500 mg 4 times daily for 7 days.  Patient was given return precautions including increased fever and fatigue.  Shortness of breath I discussed my concern about patient's shortness of breath on exam today, but she did not share this concern.  She has several reasons for shortness of breath, including her morbid obesity, deconditioning, asthma, and possibility of upper respiratory infection.  Reassured that her pulse oximetry was normal today.  Encouraged patient to let me know if she becomes more concerned about her shortness of breath.    Lezlie Octave, M.D. 04/02/2019, 6:43 AM PGY-3, [facility]

## 2019-04-01 NOTE — Patient Instructions (Addendum)
It was nice seeing you today Catherine Powell!  Your symptoms and urine test today were consistent with a UTI.  Because you have health visit, we are giving you a shot of the antibiotic ceftriaxone today.  We will continue your treatment with the antibiotic Keflex, which you should take 4 times per day for the next 7 days.  Please let me know if your symptoms do not improve with this treatment and call us if you have any concerns about your shortness of breath.  If you have any questions or concerns, please feel free to call the clinic.   Be well,  Dr. Frances Furbish

## 2019-04-02 DIAGNOSIS — N3000 Acute cystitis without hematuria: Secondary | ICD-10-CM | POA: Insufficient documentation

## 2019-04-02 DIAGNOSIS — R0602 Shortness of breath: Secondary | ICD-10-CM | POA: Insufficient documentation

## 2019-04-02 NOTE — Assessment & Plan Note (Signed)
Patient likely has a UTI given her UA results and history.  Reassuring that urine microscopy does not show any RBCs.  We will obtain a urine culture and give patient ceftriaxone 1 g IM today due to the severity of her symptoms and her mild CVA tenderness.  We will continue therapy with Keflex 500 mg 4 times daily for 7 days.  Patient was given return precautions including increased fever and fatigue.

## 2019-04-02 NOTE — Assessment & Plan Note (Signed)
I discussed my concern about patient's shortness of breath on exam today, but she did not share this concern.  She has several reasons for shortness of breath, including her morbid obesity, deconditioning, asthma, and possibility of upper respiratory infection.  Reassured that her pulse oximetry was normal today.  Encouraged patient to let me know if she becomes more concerned about her shortness of breath.

## 2019-04-05 LAB — URINE CULTURE

## 2019-04-15 ENCOUNTER — Telehealth: Payer: Self-pay | Admitting: *Deleted

## 2019-04-15 NOTE — Telephone Encounter (Signed)
Received fax from [facility] requesting a refill on pts QUINAPRIL Tabs 20mg  90 day supply with 3 refills. Looks like it is currently at . Please advise.Torrence Hammack Zimmerman Rumple, CMA

## 2019-04-16 ENCOUNTER — Other Ambulatory Visit: Payer: Self-pay | Admitting: Family Medicine

## 2019-04-16 NOTE — Telephone Encounter (Signed)
A year's supply of this medication was sent in October 2020, so I am unsure why it needs to be reordered.  I will hold off on reordering it for now.  Thanks.

## 2019-04-17 ENCOUNTER — Other Ambulatory Visit: Payer: Self-pay | Admitting: Family Medicine

## 2019-04-17 DIAGNOSIS — M170 Bilateral primary osteoarthritis of knee: Secondary | ICD-10-CM

## 2019-04-17 DIAGNOSIS — G8929 Other chronic pain: Secondary | ICD-10-CM

## 2019-04-19 ENCOUNTER — Other Ambulatory Visit: Payer: Self-pay | Admitting: Family Medicine

## 2019-04-19 MED ORDER — QUINAPRIL HCL 20 MG PO TABS: 20.0000 mg | ORAL_TABLET | Freq: Every day | ORAL | 3 refills | Status: DC

## 2019-04-19 MED ORDER — HYDROCODONE-ACETAMINOPHEN 5-325 MG PO TABS: 1.0000 | ORAL_TABLET | Freq: Three times a day (TID) | ORAL | 0 refills | Status: DC | PRN

## 2019-04-19 NOTE — Telephone Encounter (Signed)
Looks like Dr. Frances Furbish sent this to [facility] today.Veer Elamin Zimmerman Rumple, CMA

## 2019-04-19 NOTE — Telephone Encounter (Signed)
LVM for pt to call our office. If pt calls, please ask them if they want to start using [facility] for their pharmacy. If so, she can call [facility] on [address] to send the Rx to [employer]. Sunday Spillers, CMA

## 2019-05-12 ENCOUNTER — Other Ambulatory Visit: Payer: Self-pay | Admitting: Family Medicine

## 2019-05-12 DIAGNOSIS — M170 Bilateral primary osteoarthritis of knee: Secondary | ICD-10-CM

## 2019-05-12 DIAGNOSIS — G8929 Other chronic pain: Secondary | ICD-10-CM

## 2019-05-13 MED ORDER — HYDROCODONE-ACETAMINOPHEN 5-325 MG PO TABS: 1.0000 | ORAL_TABLET | Freq: Three times a day (TID) | ORAL | 0 refills | Status: DC | PRN

## 2019-05-25 ENCOUNTER — Telehealth (INDEPENDENT_AMBULATORY_CARE_PROVIDER_SITE_OTHER): Payer: BC Managed Care – PPO | Admitting: Family Medicine

## 2019-05-25 ENCOUNTER — Encounter: Payer: Self-pay | Admitting: Family Medicine

## 2019-05-25 ENCOUNTER — Other Ambulatory Visit: Payer: Self-pay

## 2019-05-25 DIAGNOSIS — Z5329 Procedure and treatment not carried out because of patient's decision for other reasons: Secondary | ICD-10-CM

## 2019-05-25 NOTE — Telephone Encounter (Signed)
I have re-scheduled the patient for ATC 05/26/19 for virtual visit. I will notify patient via MyChart about scheduled appointment.

## 2019-05-25 NOTE — Progress Notes (Signed)
Attempted to text message patient twice for link to [facility], called and LVM.  It appears that patient's MyChart message that was sent by Dr. Frances Furbish earlier we will had not yet been read.  Patient advised to call the office if she would like to reschedule.

## 2019-05-26 ENCOUNTER — Telehealth (INDEPENDENT_AMBULATORY_CARE_PROVIDER_SITE_OTHER): Payer: BC Managed Care – PPO | Admitting: Family Medicine

## 2019-05-26 ENCOUNTER — Other Ambulatory Visit: Payer: Self-pay

## 2019-05-26 DIAGNOSIS — R0602 Shortness of breath: Secondary | ICD-10-CM | POA: Diagnosis not present

## 2019-05-26 NOTE — Assessment & Plan Note (Signed)
Patient with febrile viral sore throat, cough, muscle aches, decreased appetite.  Most concerning shortness of breath.  I advised that she go to the urgent care today evaluated by physician.  I also think she will likely need coronavirus testing as her symptoms are very consistent with this.  Reports no known sick contacts but her son has been sick.  Per chart review patient has a history of shortness of breath previously seen by PCP in January.  I stressed the importance of increasing versus coronavirus testing.  Patient is refusing both at this time.  States she does not think this is coronavirus.  I again asked her if she could please go to the urgent care so that we can at least listen to her lungs.  She is refusing at this time.  States that if she feels up to it she may go.  Patient the only driver in the house.  I discussed if shortness of breath worsens to call 911 and go to the emergency room to be evaluated.  Stressed the importance of evaluation for shortness of breath.  Strict return precautions given.  ED precautions given.  Follow-up if no improvement.  Conservative measures discussed at this time including Tylenol, honey, increase fluid intake.  Patient is agreeable with plan.  Work note to patient need be out of work while symptomatic.

## 2019-05-26 NOTE — Progress Notes (Signed)
[facility] Telemedicine Visit I connected with  Sharlize Hoar Hooker-Jaggi on 05/26/19 by a video enabled telemedicine application and verified that I am speaking with the correct person using two identifiers.   I discussed the limitations of evaluation and management by telemedicine. The patient expressed understanding and agreed to proceed.  Patient consented to have virtual visit. Method of visit: Video  Encounter participants: Patient: Catherine Powell - located at Home Provider: Oralia Manis - located at [facility] Others (if applicable): None  Chief Complaint: Cough/sinus issues/muscle aches  HPI: Several symptoms  Patient reports starting to have symptoms on Saturday/Sunday. Reports now she feels worse. States symptoms started as a toothache. Then became sinus pressure/soreness.  Now has a sore throat.  Reports cough as well.  Does report some trouble breathing. Trouble breathing occurs after coughing. Reports fevers on Sunday, subjective. Reports chills as well. Reports muscle aches as well, "I feel like I was run over by a mack truck". No known sick contacts. Son was sick but he has been sick since February. Not able to work, usually works from home. Reports poor appetite. Can still taste and smell.   ROS: per HPI  Pertinent PMHx: GERD, morbid obesity, SOB  Exam:  Respiratory: speaking full sentences, no increased WOB  Assessment/Plan:  Shortness of breath Patient with febrile viral sore throat, cough, muscle aches, decreased appetite.  Most concerning shortness of breath.  I advised that she go to the urgent care today evaluated by physician.  I also think she will likely need coronavirus testing as her symptoms are very consistent with this.  Reports no known sick contacts but her son has been sick.  Per chart review patient has a history of shortness of breath previously seen by PCP in January.  I stressed the importance of increasing versus  coronavirus testing.  Patient is refusing both at this time.  States she does not think this is coronavirus.  I again asked her if she could please go to the urgent care so that we can at least listen to her lungs.  She is refusing at this time.  States that if she feels up to it she may go.  Patient the only driver in the house.  I discussed if shortness of breath worsens to call 911 and go to the emergency room to be evaluated.  Stressed the importance of evaluation for shortness of breath.  Strict return precautions given.  ED precautions given.  Follow-up if no improvement.  Conservative measures discussed at this time including Tylenol, honey, increase fluid intake.  Patient is agreeable with plan.  Work note to patient need be out of work while symptomatic.    Time spent during visit with patient: 15 minutes

## 2019-06-01 ENCOUNTER — Encounter: Payer: Self-pay | Admitting: Family Medicine

## 2019-06-08 ENCOUNTER — Telehealth: Payer: Self-pay

## 2019-06-08 NOTE — Telephone Encounter (Signed)
I checked my box yesterday and did not see an FMLA form.  I will check my box again tomorrow to see if the new one has arrived.  Thanks.

## 2019-06-08 NOTE — Telephone Encounter (Signed)
Patient calls nurse line stating she faxed over FMLA on Friday. I did not see any paperwork in providers box. Do you have these? Patient stated she was hoping to return to work today, however needs FMLA filled out to return. Patient faxing over another copy in case PCP never received.

## 2019-06-21 ENCOUNTER — Encounter: Payer: Self-pay | Admitting: Family Medicine

## 2019-06-21 ENCOUNTER — Other Ambulatory Visit: Payer: Self-pay | Admitting: Family Medicine

## 2019-06-21 DIAGNOSIS — M170 Bilateral primary osteoarthritis of knee: Secondary | ICD-10-CM

## 2019-06-21 DIAGNOSIS — G8929 Other chronic pain: Secondary | ICD-10-CM

## 2019-06-21 MED ORDER — HYDROCODONE-ACETAMINOPHEN 5-325 MG PO TABS: 1.0000 | ORAL_TABLET | Freq: Three times a day (TID) | ORAL | 0 refills | Status: DC | PRN

## 2019-06-29 ENCOUNTER — Encounter: Payer: Self-pay | Admitting: Family Medicine

## 2019-06-29 ENCOUNTER — Other Ambulatory Visit: Payer: Self-pay

## 2019-06-29 DIAGNOSIS — G43109 Migraine with aura, not intractable, without status migrainosus: Secondary | ICD-10-CM

## 2019-06-29 MED ORDER — SUMATRIPTAN SUCCINATE 50 MG PO TABS: ORAL_TABLET | ORAL | 3 refills | Status: DC

## 2019-06-30 MED ORDER — SUMATRIPTAN SUCCINATE 50 MG PO TABS: ORAL_TABLET | ORAL | 3 refills | Status: DC

## 2019-06-30 NOTE — Telephone Encounter (Signed)
[facility]   [facility].  Advised [facility] to call [facility] to cancel order. Jone Baseman, CMA

## 2019-06-30 NOTE — Addendum Note (Signed)
Addended by: Jone Baseman D on: 06/30/2019 03:13 PM   Modules accepted: Orders

## 2019-07-12 ENCOUNTER — Other Ambulatory Visit: Payer: Self-pay | Admitting: Family Medicine

## 2019-07-12 ENCOUNTER — Encounter: Payer: Self-pay | Admitting: Family Medicine

## 2019-07-12 DIAGNOSIS — R110 Nausea: Secondary | ICD-10-CM

## 2019-07-12 DIAGNOSIS — G8929 Other chronic pain: Secondary | ICD-10-CM

## 2019-07-12 DIAGNOSIS — M170 Bilateral primary osteoarthritis of knee: Secondary | ICD-10-CM

## 2019-07-12 DIAGNOSIS — K219 Gastro-esophageal reflux disease without esophagitis: Secondary | ICD-10-CM

## 2019-07-12 MED ORDER — ESOMEPRAZOLE MAGNESIUM 40 MG PO CPDR: 40.0000 mg | DELAYED_RELEASE_CAPSULE | Freq: Every day | ORAL | 5 refills | Status: DC

## 2019-07-12 MED ORDER — PROMETHAZINE HCL 25 MG PO TABS: 25.0000 mg | ORAL_TABLET | Freq: Three times a day (TID) | ORAL | 3 refills | Status: DC | PRN

## 2019-07-12 MED ORDER — HYDROCODONE-ACETAMINOPHEN 5-325 MG PO TABS: 1.0000 | ORAL_TABLET | Freq: Three times a day (TID) | ORAL | 0 refills | Status: DC | PRN

## 2019-08-09 ENCOUNTER — Other Ambulatory Visit: Payer: Self-pay | Admitting: Family Medicine

## 2019-08-09 DIAGNOSIS — M170 Bilateral primary osteoarthritis of knee: Secondary | ICD-10-CM

## 2019-08-09 DIAGNOSIS — G8929 Other chronic pain: Secondary | ICD-10-CM

## 2019-08-10 MED ORDER — HYDROCODONE-ACETAMINOPHEN 5-325 MG PO TABS: 1.0000 | ORAL_TABLET | Freq: Three times a day (TID) | ORAL | 0 refills | Status: DC | PRN

## 2019-09-03 ENCOUNTER — Encounter: Payer: Self-pay | Admitting: Family Medicine

## 2019-09-06 ENCOUNTER — Other Ambulatory Visit: Payer: Self-pay | Admitting: Family Medicine

## 2019-09-06 DIAGNOSIS — G8929 Other chronic pain: Secondary | ICD-10-CM

## 2019-09-06 DIAGNOSIS — M170 Bilateral primary osteoarthritis of knee: Secondary | ICD-10-CM

## 2019-09-07 MED ORDER — HYDROCODONE-ACETAMINOPHEN 5-325 MG PO TABS: 1.0000 | ORAL_TABLET | Freq: Three times a day (TID) | ORAL | 0 refills | Status: DC | PRN

## 2019-09-08 ENCOUNTER — Ambulatory Visit (INDEPENDENT_AMBULATORY_CARE_PROVIDER_SITE_OTHER): Payer: BC Managed Care – PPO | Admitting: Family Medicine

## 2019-09-08 ENCOUNTER — Encounter: Payer: Self-pay | Admitting: Family Medicine

## 2019-09-08 ENCOUNTER — Other Ambulatory Visit: Payer: Self-pay

## 2019-09-08 VITALS — BP 130/75 | HR 84 | Wt >= 6400 oz

## 2019-09-08 DIAGNOSIS — Z1211 Encounter for screening for malignant neoplasm of colon: Secondary | ICD-10-CM | POA: Diagnosis not present

## 2019-09-08 DIAGNOSIS — R635 Abnormal weight gain: Secondary | ICD-10-CM

## 2019-09-08 DIAGNOSIS — G43109 Migraine with aura, not intractable, without status migrainosus: Secondary | ICD-10-CM | POA: Diagnosis not present

## 2019-09-08 DIAGNOSIS — Z1231 Encounter for screening mammogram for malignant neoplasm of breast: Secondary | ICD-10-CM | POA: Diagnosis not present

## 2019-09-08 MED ORDER — TOPIRAMATE 100 MG PO TABS: 100.0000 mg | ORAL_TABLET | Freq: Two times a day (BID) | ORAL | 3 refills | Status: DC

## 2019-09-08 MED ORDER — TOPIRAMATE 25 MG PO TABS: 50.0000 mg | ORAL_TABLET | Freq: Two times a day (BID) | ORAL | 3 refills | Status: DC

## 2019-09-08 MED ORDER — SAXENDA 18 MG/3ML ~~LOC~~ SOPN: 0.6000 mg | PEN_INJECTOR | Freq: Every day | SUBCUTANEOUS | 3 refills | Status: DC

## 2019-09-08 NOTE — Assessment & Plan Note (Addendum)
Discussed methods for weight loss today, including increasing her physical activity in any way that is enjoyable and low impact as well as eating more vegetables. She would like to have nutrition counseling, so the number of Dr. Gerilyn Pilgrim was given to her.  She and her husband would benefit from this, since her husband cooks and does not seem to heat her wishes for low calorie cooking.  I will also start Saxenda today for weight loss.  Patient may benefit from [facility] in the future.  I will also check a TSH given patient's recent weight gain, dry skin, and cold intolerance.

## 2019-09-08 NOTE — Assessment & Plan Note (Signed)
These are severe and continue to be a problem for the patient.  We will increase her Topamax today to 100 mg twice daily.  I will also place another referral to [facility] neurology since the patient is seen a doctor at this practice in the past.  They may want to try a specialized migraine medication such as a monoclonal antibody for further treatment.

## 2019-09-08 NOTE — Progress Notes (Addendum)
    SUBJECTIVE:   CHIEF COMPLAINT / HPI:   Migraine headaches Patient continues to have significant migraine headaches, which occur at least weekly.  These often prevent her from going into work, and she will need FMLA filled out again soon.  She has tried amitriptyline for prevention in the past, but it made her too fatigued during the day, even when she would take it before bedtime.  She is currently on propranolol, Topamax, riboflavin, coenzyme Q, and sumatriptan for abortive therapy.  She has been to a neurologist before, but they wanted to do Botox, which made her concerned since she has difficulty swallowing due to a previous surgery.  However, she is amenable to going back to neurology since there are new medications including monoclonal antibodies that have come out since then.  Weight gain Patient notes that she has gained about 13 pounds since February.  She has not changed her diet and has been drinking more water and attempts to be healthier, so this is frustrating for her.  She says that her husband does most of the cooking and he is "her enabler."  She is excited to go back to the office in July since this will force her to get more exercise by walking from her car to the office.  She has gotten a apple watch recently and is hoping that this will help her to exercise more.  She has looked into bariatric surgery, but unfortunately her insurance does not cover bariatric surgery.  She would be interested in medications or nutrition therapy to lose weight and knows that it is contributing to her shortness of breath and joint pain.  Patient also notes cold intolerance, dry skin.   PERTINENT  PMH / PSH: Morbid obesity, migraine headaches  OBJECTIVE:   BP 130/75   Pulse 84   Wt (!) 440 lb (199.6 kg)   LMP 05/15/2012   SpO2 98%   BMI 66.90 kg/m   General: well appearing, appears stated age Cardiac: RRR, no MRG Respiratory: CTAB, no rhonchi, rales, or wheezing, normal work of  breathing Skin: no rashes or other lesions, warm and well perfused Psych: appropriate mood and affect   ASSESSMENT/PLAN:   Migraine These are severe and continue to be a problem for the patient.  We will increase her Topamax today to 100 mg twice daily.  I will also place another referral to [facility] neurology since the patient is seen a doctor at this practice in the past.  They may want to try a specialized migraine medication such as a monoclonal antibody for further treatment.  Morbid obesity (HCC) Discussed methods for weight loss today, including increasing her physical activity in any way that is enjoyable and low impact as well as eating more vegetables. She would like to have nutrition counseling, so the number of Dr. Gerilyn Pilgrim was given to her.  She and her husband would benefit from this, since her husband cooks and does not seem to heat her wishes for low calorie cooking.  I will also start Saxenda today for weight loss.  Patient may benefit from [facility] in the future.  I will also check a TSH given patient's recent weight gain, dry skin, and cold intolerance.     Lennox Solders, MD [facility]

## 2019-09-08 NOTE — Patient Instructions (Addendum)
It was nice seeing you today Catherine Powell!  For your migraines, we are increasing your Topamax to 100 mg/day, which will be 1 pill in the morning and 1 at night.  We are also referring you back to neurology to see if they can prescribe a more specialized medication for you.  For your weight, please work on getting physical activity in every day and work on eating as many vegetables as you can.  I am attaching our nutritionist card to this paperwork, and I highly encourage you to call her and arrange a time for you and your husband to meet with her.  We are also starting a medication called Saxenda, which is a once daily injection.  This medication works by reducing her appetite.  Some people may feel some nausea when starting this medication, but this should go away quickly.  Please start at 0.6 mg/day for the the first week, then increase it by 0.6 mg every week until you have a maximum dose of 3.0 mg/day.  We are checking your thyroid levels today to make sure that this is not contributing to your weight gain.  I have also sent in orders for your mammogram and colonoscopy.  Please make these appointments when you are ready.  If you have any questions or concerns, please feel free to call the clinic.   Be well,  Dr. Frances Furbish

## 2019-09-09 ENCOUNTER — Encounter: Payer: Self-pay | Admitting: Family Medicine

## 2019-09-09 ENCOUNTER — Telehealth: Payer: Self-pay | Admitting: Family Medicine

## 2019-09-09 ENCOUNTER — Other Ambulatory Visit: Payer: Self-pay | Admitting: Family Medicine

## 2019-09-09 DIAGNOSIS — I872 Venous insufficiency (chronic) (peripheral): Secondary | ICD-10-CM

## 2019-09-09 LAB — TSH: TSH: 1.81 u[IU]/mL (ref 0.450–4.500)

## 2019-09-09 MED ORDER — TORSEMIDE 20 MG PO TABS: 10.0000 mg | ORAL_TABLET | Freq: Every day | ORAL | 3 refills | Status: DC

## 2019-09-09 NOTE — Telephone Encounter (Signed)
Called patient regarding mediciaton refill.  Spoke with pharmacy and orders are as follows. Top 100mg  BID Torsemide 10-20mg  Daily  will need prior authorization form completed.  Pharmacy will fax to clinic for completion.  Bernie Covey, MD [facility]

## 2019-09-09 NOTE — Telephone Encounter (Signed)
Notes

## 2019-09-20 ENCOUNTER — Encounter: Payer: Self-pay | Admitting: Family Medicine

## 2019-09-21 ENCOUNTER — Encounter (HOSPITAL_COMMUNITY): Payer: Self-pay

## 2019-09-21 ENCOUNTER — Emergency Department (HOSPITAL_COMMUNITY): Payer: BC Managed Care – PPO

## 2019-09-21 ENCOUNTER — Emergency Department (HOSPITAL_COMMUNITY)
Admission: EM | Admit: 2019-09-21 | Discharge: 2019-09-21 | Disposition: A | Discharge status: Home / Self Care | Payer: BC Managed Care – PPO | Attending: Emergency Medicine | Admitting: Emergency Medicine

## 2019-09-21 ENCOUNTER — Other Ambulatory Visit: Payer: Self-pay

## 2019-09-21 ENCOUNTER — Telehealth: Payer: Self-pay

## 2019-09-21 ENCOUNTER — Telehealth: Payer: Self-pay | Admitting: Family Medicine

## 2019-09-21 DIAGNOSIS — Z5321 Procedure and treatment not carried out due to patient leaving prior to being seen by health care provider: Secondary | ICD-10-CM | POA: Diagnosis not present

## 2019-09-21 DIAGNOSIS — R079 Chest pain, unspecified: Secondary | ICD-10-CM | POA: Diagnosis not present

## 2019-09-21 DIAGNOSIS — R110 Nausea: Secondary | ICD-10-CM | POA: Insufficient documentation

## 2019-09-21 DIAGNOSIS — J90 Pleural effusion, not elsewhere classified: Secondary | ICD-10-CM | POA: Diagnosis not present

## 2019-09-21 LAB — BASIC METABOLIC PANEL
Anion gap: 10 (ref 5–15)
BUN: 8 mg/dL (ref 6–20)
CO2: 26 mmol/L (ref 22–32)
Calcium: 8.7 mg/dL — ABNORMAL LOW (ref 8.9–10.3)
Chloride: 101 mmol/L (ref 98–111)
Creatinine, Ser: 0.8 mg/dL (ref 0.44–1.00)
GFR calc Af Amer: >60 mL/min (ref >60)
GFR calc non Af Amer: >60 mL/min (ref >60)
Glucose, Bld: 95 mg/dL (ref 70–99)
Potassium: 3.9 mmol/L (ref 3.5–5.1)
Sodium: 137 mmol/L (ref 135–145)

## 2019-09-21 LAB — CBC
HCT: 41 % (ref 36.0–46.0)
Hemoglobin: 12.9 g/dL (ref 12.0–15.0)
MCH: 27.8 pg (ref 26.0–34.0)
MCHC: 31.5 g/dL (ref 30.0–36.0)
MCV: 88.4 fL (ref 80.0–100.0)
Platelets: 287 10*3/uL (ref 150–400)
RBC: 4.64 MIL/uL (ref 3.87–5.11)
RDW: 13.1 % (ref 11.5–15.5)
WBC: 8.6 10*3/uL (ref 4.0–10.5)
nRBC: 0 % (ref 0.0–0.2)

## 2019-09-21 LAB — TROPONIN I (HIGH SENSITIVITY): Troponin I (High Sensitivity): 4 ng/L (ref <18)

## 2019-09-21 NOTE — ED Notes (Signed)
No answer for VS x 2, patient advocate reports patient left with husband earlier.

## 2019-09-21 NOTE — Telephone Encounter (Signed)
Received fax from pharmacy, PA needed on Saxenda. Covermymeds was not able to readily upload clinical questions. Instructed to wait up to 24 hours. Will recheck tomorrow.   Key: [id number]  In the meantime has the patient tried or failed any other weight loss medications?

## 2019-09-21 NOTE — Telephone Encounter (Signed)
Medication approved via covermymeds. I contacted pharmacy and updated them, she now can get a months worth for ~25 dollars.

## 2019-09-21 NOTE — ED Triage Notes (Signed)
Pt reports central chest pain with DOE for the past few days. Some nausea. Resp e.u at this time

## 2019-09-21 NOTE — Telephone Encounter (Signed)
**  After hours emergency line call**   Received a call from Catherine Powell while she is currently waiting in the ED to be seen due to chest pain.  She reports it has been taking too long and would like to know if her labs have come back yet.  Says if the "heart test" came back normal that she was going to leave and follow-up in our clinic tomorrow.  Briefly discusses that she had had chest pressure associated with nausea and dyspnea since Sunday that subsequently became worse this afternoon.  Fortunately has eased off after some aspirin at home.  Discussed her lab results and EKG findings.  Adamantly encouraged her to wait for further evaluation and discussed that one normal troponin in the situation does not rule out ACS.  She is willing to wait a little bit longer, went ahead and scheduled her a follow-up appointment pending disposition for tomorrow at 1:30 PM in [facility] clinic.  Will route to PCP.  Allayne Stack, DO

## 2019-09-22 ENCOUNTER — Ambulatory Visit (INDEPENDENT_AMBULATORY_CARE_PROVIDER_SITE_OTHER): Payer: BC Managed Care – PPO | Admitting: Family Medicine

## 2019-09-22 ENCOUNTER — Other Ambulatory Visit: Payer: Self-pay

## 2019-09-22 ENCOUNTER — Telehealth: Payer: Self-pay | Admitting: Family Medicine

## 2019-09-22 VITALS — BP 112/72 | HR 77 | Wt >= 6400 oz

## 2019-09-22 DIAGNOSIS — R0600 Dyspnea, unspecified: Secondary | ICD-10-CM

## 2019-09-22 DIAGNOSIS — R0609 Other forms of dyspnea: Secondary | ICD-10-CM

## 2019-09-22 DIAGNOSIS — R0789 Other chest pain: Secondary | ICD-10-CM | POA: Diagnosis not present

## 2019-09-22 NOTE — Patient Instructions (Signed)
   Please take your symbicort every day.    I will order an echo and a BNP to evaluate for heart failure.  I will call you with the results.    Please do not take any more aspirin unless it is a daily baby aspirin.  You can take tylenol for pain.  Please do not take NSAIDs such as ibuprofen.   Please schedule an appointment in two weeks to reassess this issue.    Have a great day,   Frederic Jericho, MD

## 2019-09-22 NOTE — Telephone Encounter (Signed)
Clinical info completed on FMLA form.  Place form in Dr. Claris Che box for completion.  Catherine Powell, CMA

## 2019-09-22 NOTE — Telephone Encounter (Signed)
FLMA  form dropped off for at front desk for completion.  Verified that patient section of form has been completed.  Last DOS/WCC with PCP was 09/08/2019  Placed form in team folder to be completed by clinical staff.  Catherine Powell

## 2019-09-22 NOTE — Progress Notes (Signed)
    SUBJECTIVE:   CHIEF COMPLAINT / HPI:   Chest pain w/ DOE: Patient developed chest pain last Sunday.  It woke her up in the morning at approximately 3 AM and traveled up to her left shoulder.  It was previously with deep respirations, but evolved to constant pain.  She has been taking 2 regular strength aspirins every 6 hours since Monday.  She has not taken any other medications for it.  The patient went to the ED yesterday but felt like it was taking too long so she called our after-hours line to ask about her blood work drawn in the ED.  Blood work was discussed with her but she was advised to stay for the remainder of the work-up.  Patient decided to leave prior to being seen by a physician in the ED.  She says the pain is better today.  Is not as sharp as it was before.  She still feels short of breath, but she feels short of breath "every summer" and this does not feel any worse than previous summers.  Patient has chronically swollen legs bilaterally, and she does not feel that they are more swollen now than usual or that one side is different than the other and she denies any leg pain.  Patient has asthma for which she takes albuterol and Symbicort.  She also has allergies for which she is taking Claritin and Singulair, but not her Flonase.  Patient is not aware of ever being diagnosed with heart failure.  PERTINENT  PMH / PSH: asthma, HTN, obesity.  Patient has a history of rheumatic fever as a child she states.  OBJECTIVE:   BP 112/72   Pulse 77   Wt (!) 441 lb 6.4 oz (200.2 kg)   LMP 05/15/2012   SpO2 97%   BMI 67.11 kg/m   General: Alert.  No acute distress.  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  No murmurs.  No chest wall tenderness to palpation Pulmonary: Lungs clear to auscultation bilaterally.  Decreased air movement with inspiration and expiration diffusely.  No wheezing. Extremities: Significant lipedema bilaterally.   Nonpitting.  Legs symmetrical.  No tenderness with palpation of the calf or popliteal region.  ASSESSMENT/PLAN:   Chest wall pain Patient with no known history of CHF went to the ED yesterday, but left before being seen by provider.  Initial work-up included normal EKG and first troponin which was 4.  Despite not having a second troponin drawn, the 4 was reassuring giving the length of time it occurred after the initial onset of the chest pain.  Do not believe this is pulmonary embolism, as patient no reports of leg pain and no unilateral leg swelling.  Patient is also satting on room air and is not tachycardic.  Pain sounds as if it was initially pleuritic in nature.  May be due to poorly controlled asthma, but did not appreciate any wheezing on exam.  Could be GI or MSK in nature, but we will obtain BNP and echocardiogram to rule out any new onset of CHF.     Sandre Kitty, MD [facility]

## 2019-09-23 ENCOUNTER — Ambulatory Visit (HOSPITAL_COMMUNITY)
Admission: RE | Admit: 2019-09-23 | Discharge: 2019-09-23 | Disposition: A | Discharge status: Home / Self Care | Payer: BC Managed Care – PPO | Source: Ambulatory Visit | Attending: Family Medicine | Admitting: Family Medicine

## 2019-09-23 DIAGNOSIS — R0600 Dyspnea, unspecified: Secondary | ICD-10-CM

## 2019-09-23 DIAGNOSIS — R0609 Other forms of dyspnea: Secondary | ICD-10-CM

## 2019-09-23 LAB — ECHOCARDIOGRAM COMPLETE
AR max vel: 1.76 cm2
AV Area VTI: 1.98 cm2
AV Area mean vel: 1.79 cm2
AV Mean grad: 12 mmHg
AV Peak grad: 24.6 mmHg
Ao pk vel: 2.48 m/s
Area-P 1/2: 2.83 cm2
S' Lateral: 3.8 cm

## 2019-09-23 LAB — BRAIN NATRIURETIC PEPTIDE: BNP: 13.8 pg/mL (ref 0.0–100.0)

## 2019-09-23 NOTE — Telephone Encounter (Signed)
Received FMLA.  Will notify when completed.

## 2019-09-23 NOTE — Progress Notes (Signed)
°  Echocardiogram 2D Echocardiogram has been performed.  Catherine Powell 09/23/2019, 4:02 PM

## 2019-09-24 ENCOUNTER — Other Ambulatory Visit: Payer: Self-pay | Admitting: Family Medicine

## 2019-09-24 DIAGNOSIS — Z1231 Encounter for screening mammogram for malignant neoplasm of breast: Secondary | ICD-10-CM

## 2019-09-25 DIAGNOSIS — R0789 Other chest pain: Secondary | ICD-10-CM | POA: Insufficient documentation

## 2019-09-25 NOTE — Assessment & Plan Note (Signed)
Patient with no known history of CHF went to the ED yesterday, but left before being seen by provider.  Initial work-up included normal EKG and first troponin which was 4.  Despite not having a second troponin drawn, the 4 was reassuring giving the length of time it occurred after the initial onset of the chest pain.  Do not believe this is pulmonary embolism, as patient no reports of leg pain and no unilateral leg swelling.  Patient is also satting on room air and is not tachycardic.  Pain sounds as if it was initially pleuritic in nature.  May be due to poorly controlled asthma, but did not appreciate any wheezing on exam.  Could be GI or MSK in nature, but we will obtain BNP and echocardiogram to rule out any new onset of CHF.

## 2019-09-27 ENCOUNTER — Encounter: Payer: Self-pay | Admitting: Family Medicine

## 2019-09-27 ENCOUNTER — Telehealth: Payer: Self-pay | Admitting: Family Medicine

## 2019-09-27 NOTE — Telephone Encounter (Signed)
Can we call this pt to let her know that both her Echocardiogram and the blood test we performed to examine her heart function came back normal.  Both of these findings indicate that she does not have heart failure.  If she is still having chest pain that she would like to be worked up she can make an appointment with her PCP.

## 2019-09-27 NOTE — Telephone Encounter (Signed)
Pt informed. Has an appt with Dr. Clent Ridges on 7/23. Sunday Spillers, CMA

## 2019-09-29 ENCOUNTER — Telehealth: Payer: Self-pay | Admitting: *Deleted

## 2019-09-29 NOTE — Telephone Encounter (Signed)
Received an email that patient's echo still needs PA and it was performed on 09-23-2019.  After speaking with [employer] claims department, I will need to mail a copy of the office notes, echo report and claims form since it has been more than 2 days post imaging and that grace period has ended.  Printed off needed information and placed in mail.  Will have to await for them to receive it and then mail me back a 13 digit reference number that I will then need to use in order to get authorization for this.  Ji Feldner,CMA

## 2019-09-30 ENCOUNTER — Telehealth: Payer: Self-pay

## 2019-09-30 MED ORDER — INSULIN PEN NEEDLE 31G X 5 MM MISC: 0 refills | Status: DC

## 2019-09-30 NOTE — Telephone Encounter (Signed)
Patient calls nurse line requesting needles for Saxenda administration. Called and spoke with pharmacist, verified correct needles and sent to pharmacy.   FYI to PCP  Veronda Prude, RN

## 2019-10-01 ENCOUNTER — Ambulatory Visit: Payer: BC Managed Care – PPO | Admitting: Family Medicine

## 2019-10-01 ENCOUNTER — Telehealth: Payer: Self-pay | Admitting: Family Medicine

## 2019-10-01 NOTE — Telephone Encounter (Signed)
**  After hours emergency line call**   Catherine Powell is a 54 year old female calling into the after-hours line due to recent missed calls.  States she just missed 2 phone calls from our clinic this evening and when trying to return the call it directed her to the after-hours line.   She is not sure what the call was about, wonders if it is dealing with her recent paperwork.  Reports she did already receive the needle refill that she called about yesterday.   Fortunately I can see that Dr. Clent Ridges has an open telephone encounter for this patient from earlier this afternoon.  I will send a message to her letting her know that the patient called back and is available at this time, otherwise pending on urgency can call patient next week.  Allayne Stack, DO

## 2019-10-01 NOTE — Telephone Encounter (Signed)
Spoke with patient about FMLA forms.  Patient had to rebook today's appointment for August.  Discussed that may take some time to get forms completed as I have not seen patient in clinic.  She will resend second set of forms for [place] to go through at next visit.  [name], MD [facility]

## 2019-10-01 NOTE — Telephone Encounter (Signed)
Returned patients call. Catherine Allan, MD [facility]

## 2019-10-03 NOTE — Telephone Encounter (Signed)
error 

## 2019-10-11 ENCOUNTER — Other Ambulatory Visit: Payer: Self-pay

## 2019-10-11 ENCOUNTER — Ambulatory Visit: Payer: BC Managed Care – PPO | Admitting: Family Medicine

## 2019-10-11 DIAGNOSIS — G8929 Other chronic pain: Secondary | ICD-10-CM

## 2019-10-11 DIAGNOSIS — M170 Bilateral primary osteoarthritis of knee: Secondary | ICD-10-CM

## 2019-10-11 MED ORDER — HYDROCODONE-ACETAMINOPHEN 5-325 MG PO TABS: 1.0000 | ORAL_TABLET | Freq: Three times a day (TID) | ORAL | 0 refills | Status: DC | PRN

## 2019-10-12 ENCOUNTER — Encounter: Payer: Self-pay | Admitting: Family Medicine

## 2019-10-12 NOTE — Progress Notes (Signed)
    SUBJECTIVE:   CHIEF COMPLAINT / HPI:   Patient has no acute concerns today. She was diagnosed with migraines at the age of 28 and has tried multiple treatments.  She has an appointment with a neurologist on Aug 25/20   HEADACHE   Onset: 2 hours Location: top of head Quality: stabbing pain Frequency: 2-3 times a week Precipitating factors: none Prior treatment: Propranolol, Sumatriptan, Topirimate  Associated Symptoms Nausea/vomiting: yes  Photophobia/phonophobia: yes  Tearing of eyes: yes  Sinus pain/pressure: no  Family hx migraine: yes  Personal stressors: yes  Relation to menstrual cycle: no   Red Flags Fever: no  Neck pain/stiffness: no  Vision/speech/swallow/hearing difficulty: no  Focal weakness/numbness: no  Altered mental status: no  Trauma: no  New type of headache: no  Anticoagulant use: no  H/o cancer/HIV/Pregnancy: no   FML forms faxed Aug 2  HTN Elevated today 142/82.  Asymptomatic.  Compliant with medication.  Last noted BP 112/72 at last visit.  Obesity Currently on Saxenda 0.6 weekly.  Tolerating well.  Lost 3lbs  Health Maintenance Colonoscopy and Mammogram booked per patient PAP due 2022  PERTINENT  PMH / PSH:  HTN Obesity   OBJECTIVE:   BP (!) 142/82   Pulse 97   Ht 5' 8.5" (1.74 m)   Wt (!) 438 lb 9.6 oz (198.9 kg)   LMP 05/15/2012   SpO2 98%   BMI 65.72 kg/m    General: Alert and oriented, no apparent distress  Cardiovascular: RRR with no murmurs noted Respiratory: CTA bilaterally  Gastrointestinal: Bowel sounds present. No abdominal pain MSK: Upper extremity strength 5/5 bilaterally, Lower extremity strength 5/5 bilaterally    ASSESSMENT/PLAN:   Migraine Chronic.  Neurology appointment scheduled for this month.  Exam normal.  FMLA forms completed and faxed 08/02 -Continue current therapy -Follow up as needed  Essential hypertension Elevated BP.  Asymptomatic. -asked patient to record BP for two weeks and then  follow up to discuss results     Catherine Allan, MD [facility]

## 2019-10-12 NOTE — Patient Instructions (Signed)
Thank you for coming to see me today. It was a pleasure.   Check your blood pressure for the next couple of weeks and record measurements. We will discuss at your next appointment  Congrats on the weight loss.  I wish you all the best in your journey and am here if you need help.  Please follow-up with PCP in 2 weeks  If you have any questions or concerns, please do not hesitate to call the office at [phone].  Best,   [name], MD [facility]

## 2019-10-13 ENCOUNTER — Encounter: Payer: Self-pay | Admitting: Family Medicine

## 2019-10-13 ENCOUNTER — Ambulatory Visit (INDEPENDENT_AMBULATORY_CARE_PROVIDER_SITE_OTHER): Payer: BC Managed Care – PPO | Admitting: Family Medicine

## 2019-10-13 ENCOUNTER — Other Ambulatory Visit: Payer: Self-pay

## 2019-10-13 VITALS — BP 142/82 | HR 97 | Ht 68.5 in | Wt >= 6400 oz

## 2019-10-13 DIAGNOSIS — E7800 Pure hypercholesterolemia, unspecified: Secondary | ICD-10-CM | POA: Diagnosis not present

## 2019-10-13 DIAGNOSIS — G43109 Migraine with aura, not intractable, without status migrainosus: Secondary | ICD-10-CM | POA: Diagnosis not present

## 2019-10-13 DIAGNOSIS — I10 Essential (primary) hypertension: Secondary | ICD-10-CM | POA: Diagnosis not present

## 2019-10-13 DIAGNOSIS — J4530 Mild persistent asthma, uncomplicated: Secondary | ICD-10-CM

## 2019-10-13 MED ORDER — BUDESONIDE-FORMOTEROL FUMARATE 160-4.5 MCG/ACT IN AERO: 1.0000 | INHALATION_SPRAY | Freq: Two times a day (BID) | RESPIRATORY_TRACT | 11 refills | Status: DC

## 2019-10-13 MED ORDER — ALBUTEROL SULFATE HFA 108 (90 BASE) MCG/ACT IN AERS: 2.0000 | INHALATION_SPRAY | RESPIRATORY_TRACT | 3 refills | Status: DC | PRN

## 2019-10-13 NOTE — Assessment & Plan Note (Signed)
Elevated BP.  Asymptomatic. -asked patient to record BP for two weeks and then follow up to discuss results

## 2019-10-13 NOTE — Assessment & Plan Note (Signed)
Chronic.  Neurology appointment scheduled for this month.  Exam normal.  FMLA forms completed and faxed 08/02 -Continue current therapy -Follow up as needed

## 2019-10-14 LAB — LIPID PANEL
Chol/HDL Ratio: 5 ratio — ABNORMAL HIGH (ref 0.0–4.4)
Cholesterol, Total: 198 mg/dL (ref 100–199)
HDL: 40 mg/dL (ref >39)
LDL Chol Calc (NIH): 138 mg/dL — ABNORMAL HIGH (ref 0–99)
Triglycerides: 108 mg/dL (ref 0–149)
VLDL Cholesterol Cal: 20 mg/dL (ref 5–40)

## 2019-10-18 ENCOUNTER — Ambulatory Visit: Payer: BC Managed Care – PPO

## 2019-10-22 ENCOUNTER — Telehealth: Payer: Self-pay | Admitting: *Deleted

## 2019-10-22 NOTE — Telephone Encounter (Signed)
Received fax requesting a 90 day supply of pts albuterol be sent to [facility]. Looks like it was sent to [facility].Ariona Deschene Zimmerman Rumple, CMA

## 2019-10-22 NOTE — Telephone Encounter (Signed)
Fax received stating that they recently received a denied response to our renewal request for  Esomeprazole with a note "patient unknown to provider."  [facility].Siegfried Vieth Zimmerman Rumple, CMA

## 2019-10-23 ENCOUNTER — Other Ambulatory Visit: Payer: Self-pay | Admitting: Family Medicine

## 2019-10-23 DIAGNOSIS — K219 Gastro-esophageal reflux disease without esophagitis: Secondary | ICD-10-CM

## 2019-10-23 MED ORDER — ESOMEPRAZOLE MAGNESIUM 40 MG PO CPDR: 40.0000 mg | DELAYED_RELEASE_CAPSULE | Freq: Every day | ORAL | 3 refills | Status: DC

## 2019-10-25 ENCOUNTER — Ambulatory Visit: Payer: BC Managed Care – PPO

## 2019-10-31 NOTE — Progress Notes (Deleted)
    SUBJECTIVE:   CHIEF COMPLAINT / HPI: follow up BP  HTN Weight loss  PERTINENT  PMH / PSH: ***  OBJECTIVE:   LMP 05/15/2012   ***  ASSESSMENT/PLAN:   No problem-specific Assessment & Plan notes found for this encounter.     [name], MD [facility]

## 2019-11-01 ENCOUNTER — Ambulatory Visit: Payer: BC Managed Care – PPO | Admitting: Family Medicine

## 2019-11-01 ENCOUNTER — Encounter: Payer: Self-pay | Admitting: Family Medicine

## 2019-11-01 ENCOUNTER — Other Ambulatory Visit: Payer: Self-pay | Admitting: *Deleted

## 2019-11-01 DIAGNOSIS — E7800 Pure hypercholesterolemia, unspecified: Secondary | ICD-10-CM

## 2019-11-01 DIAGNOSIS — J4530 Mild persistent asthma, uncomplicated: Secondary | ICD-10-CM

## 2019-11-01 MED ORDER — BUDESONIDE-FORMOTEROL FUMARATE 160-4.5 MCG/ACT IN AERO: 1.0000 | INHALATION_SPRAY | Freq: Two times a day (BID) | RESPIRATORY_TRACT | 1 refills | Status: DC

## 2019-11-01 MED ORDER — TOPIRAMATE 100 MG PO TABS: 100.0000 mg | ORAL_TABLET | Freq: Two times a day (BID) | ORAL | 3 refills | Status: DC

## 2019-11-01 MED ORDER — PROPRANOLOL HCL 40 MG PO TABS: 80.0000 mg | ORAL_TABLET | Freq: Two times a day (BID) | ORAL | 3 refills | Status: DC

## 2019-11-01 MED ORDER — ATORVASTATIN CALCIUM 40 MG PO TABS: 40.0000 mg | ORAL_TABLET | Freq: Every evening | ORAL | 3 refills | Status: DC

## 2019-11-01 MED ORDER — MONTELUKAST SODIUM 10 MG PO TABS: 10.0000 mg | ORAL_TABLET | Freq: Every day | ORAL | 3 refills | Status: DC

## 2019-11-04 ENCOUNTER — Other Ambulatory Visit: Payer: Self-pay | Admitting: *Deleted

## 2019-11-04 DIAGNOSIS — R110 Nausea: Secondary | ICD-10-CM

## 2019-11-07 MED ORDER — PROMETHAZINE HCL 25 MG PO TABS: 25.0000 mg | ORAL_TABLET | Freq: Three times a day (TID) | ORAL | 3 refills | Status: DC | PRN

## 2019-11-17 ENCOUNTER — Other Ambulatory Visit: Payer: Self-pay | Admitting: Family Medicine

## 2019-11-17 DIAGNOSIS — M170 Bilateral primary osteoarthritis of knee: Secondary | ICD-10-CM

## 2019-11-17 DIAGNOSIS — G8929 Other chronic pain: Secondary | ICD-10-CM

## 2019-11-17 MED ORDER — HYDROCODONE-ACETAMINOPHEN 5-325 MG PO TABS: 1.0000 | ORAL_TABLET | Freq: Three times a day (TID) | ORAL | 0 refills | Status: DC | PRN

## 2019-12-07 ENCOUNTER — Encounter: Payer: Self-pay | Admitting: Family Medicine

## 2019-12-15 ENCOUNTER — Other Ambulatory Visit: Payer: Self-pay | Admitting: Family Medicine

## 2019-12-15 DIAGNOSIS — M170 Bilateral primary osteoarthritis of knee: Secondary | ICD-10-CM

## 2019-12-15 DIAGNOSIS — G8929 Other chronic pain: Secondary | ICD-10-CM

## 2019-12-16 MED ORDER — HYDROCODONE-ACETAMINOPHEN 5-325 MG PO TABS: 1.0000 | ORAL_TABLET | Freq: Three times a day (TID) | ORAL | 0 refills | Status: DC | PRN

## 2019-12-22 ENCOUNTER — Telehealth: Payer: Self-pay

## 2019-12-22 NOTE — Telephone Encounter (Signed)
Patient calls nurse line stating she needs her FMLA changed. Patient states she needs 2-4 days per 30 days for leave. Patient states this will allow her to keep her job. I have printed the previous forms that were scanned in. Will leave in PCP box for correction.

## 2019-12-27 NOTE — Telephone Encounter (Signed)
Called patient to let her know forms will be completed by Friday.  [name], MD Family Medicine Residency

## 2019-12-27 NOTE — Telephone Encounter (Signed)
Patient calls nurse line stating someone called her. I do not see any notes in her chart. Will forward to PCP.

## 2019-12-30 ENCOUNTER — Telehealth: Payer: Self-pay

## 2019-12-30 NOTE — Telephone Encounter (Signed)
Patient calls nurse line regarding referral to [facility]. Patient reports that she missed appointment yesterday, due to being given incorrect directions to office. Patient is requesting that referral be resent to Neurologist at [facility], due to bad experience with [facility].   Please advise if referral can be resent.   To PCP  Veronda Prude, RN

## 2019-12-30 NOTE — Telephone Encounter (Signed)
Please send referral to [facility].  Thank you

## 2020-01-10 ENCOUNTER — Other Ambulatory Visit: Payer: Self-pay | Admitting: Family Medicine

## 2020-01-10 DIAGNOSIS — G43109 Migraine with aura, not intractable, without status migrainosus: Secondary | ICD-10-CM

## 2020-01-10 NOTE — Progress Notes (Signed)
Referral placed to Neurology for ongoing Migraine headaches.  She requested referral to be sent to [facility] as she missed appointment with [facility].   [name], MD Family Medicine Residency

## 2020-01-10 NOTE — Telephone Encounter (Signed)
It has been 6 months since referral was originally placed.  Will ask provider to place a new neuro referral with update information from patient in the comment section.  Thanks [employer]

## 2020-01-15 ENCOUNTER — Other Ambulatory Visit: Payer: Self-pay | Admitting: Family Medicine

## 2020-01-15 DIAGNOSIS — G8929 Other chronic pain: Secondary | ICD-10-CM

## 2020-01-15 DIAGNOSIS — M170 Bilateral primary osteoarthritis of knee: Secondary | ICD-10-CM

## 2020-01-17 ENCOUNTER — Other Ambulatory Visit: Payer: Self-pay | Admitting: Family Medicine

## 2020-01-17 DIAGNOSIS — M170 Bilateral primary osteoarthritis of knee: Secondary | ICD-10-CM

## 2020-01-17 DIAGNOSIS — G8929 Other chronic pain: Secondary | ICD-10-CM

## 2020-01-17 MED ORDER — HYDROCODONE-ACETAMINOPHEN 5-325 MG PO TABS: 1.0000 | ORAL_TABLET | Freq: Three times a day (TID) | ORAL | 0 refills | Status: DC | PRN

## 2020-01-17 NOTE — Telephone Encounter (Signed)
Patient already has an appointment scheduled for 01-24-20 with PCP.  Ricca Melgarejo,CMA

## 2020-01-21 ENCOUNTER — Encounter: Payer: Self-pay | Admitting: Family Medicine

## 2020-01-23 NOTE — Patient Instructions (Addendum)
It was nice seeing you today!  Call the [facility].  [phone]  Medication refill sent to pharmacy   I will let you know about lymphedema wraps  If you have any questions or concerns, please feel free to call the clinic.   Be well,  [name], MD [facility] Medicine Residency

## 2020-01-23 NOTE — Progress Notes (Signed)
    SUBJECTIVE:   CHIEF COMPLAINT / HPI: medication refill  No acute events  Weight loss Has stopped Saxenda secondary to GI upset. She is still wanting to loose weight. Unable to exercise secondary to chronic leg swelling.    Migraines Continues to have migraines.  Neurology referral sent 11/01.  Compliant with medications.    Chronic Venous insufficiency No acute concerns. She takes Demadex prn.  Has not had prior lymphedema therapy.  Periodically having open wounds but non today.    PERTINENT  PMH / PSH: Migraines since age of 29 Chronic Venous insufficiency  OBJECTIVE:   BP 136/62   Pulse 86   Ht 5\' 9"  (1.753 m)   Wt (!) 448 lb 12.8 oz (203.6 kg)   LMP 05/15/2012   SpO2 99%   BMI 66.28 kg/m    General: Alert and oriented, no apparent distress  Cardiovascular: RRR with no murmurs noted Respiratory: CTA bilaterally  Psych: Behavior and speech appropriate to situation  ASSESSMENT/PLAN:   Morbid obesity (HCC) Unable to tolerate Saxenda secondary to GI upset.   -Encourage set small goals of exercise daily  -Encouraged to Call the [facility].  -Follow up as needed  Migraine Last migraine one week ago. Waiting to see neurology.  Referral has been sent. -Meds refilled -Continue current treatment -Follow up as needed  Chronic venous insufficiency Continues to have leg swelling.  Has had Demadex in the past.   -Discussed with patient that diuretics not treatment for CVI. Would recommend discontinuing diuretics -Referral to OT for lymphedema wraps.     07/15/2012, MD [facility]

## 2020-01-24 ENCOUNTER — Ambulatory Visit (INDEPENDENT_AMBULATORY_CARE_PROVIDER_SITE_OTHER): Payer: BC Managed Care – PPO | Admitting: Family Medicine

## 2020-01-24 ENCOUNTER — Encounter: Payer: Self-pay | Admitting: Family Medicine

## 2020-01-24 ENCOUNTER — Other Ambulatory Visit: Payer: Self-pay

## 2020-01-24 VITALS — BP 136/62 | HR 86 | Ht 69.0 in | Wt >= 6400 oz

## 2020-01-24 DIAGNOSIS — Z23 Encounter for immunization: Secondary | ICD-10-CM

## 2020-01-24 DIAGNOSIS — I872 Venous insufficiency (chronic) (peripheral): Secondary | ICD-10-CM | POA: Diagnosis not present

## 2020-01-24 DIAGNOSIS — G43109 Migraine with aura, not intractable, without status migrainosus: Secondary | ICD-10-CM

## 2020-01-24 DIAGNOSIS — E7800 Pure hypercholesterolemia, unspecified: Secondary | ICD-10-CM

## 2020-01-24 MED ORDER — NYSTATIN 100000 UNIT/GM EX CREA: 1.0000 "application " | TOPICAL_CREAM | Freq: Two times a day (BID) | CUTANEOUS | 0 refills | Status: DC

## 2020-01-24 MED ORDER — ATORVASTATIN CALCIUM 40 MG PO TABS: 40.0000 mg | ORAL_TABLET | Freq: Every evening | ORAL | 3 refills | Status: DC

## 2020-01-24 MED ORDER — QUINAPRIL HCL 20 MG PO TABS: 20.0000 mg | ORAL_TABLET | Freq: Every day | ORAL | 3 refills | Status: DC

## 2020-01-24 MED ORDER — TOPIRAMATE 100 MG PO TABS: 100.0000 mg | ORAL_TABLET | Freq: Two times a day (BID) | ORAL | 3 refills | Status: DC

## 2020-01-25 ENCOUNTER — Encounter: Payer: Self-pay | Admitting: Family Medicine

## 2020-01-25 NOTE — Addendum Note (Signed)
Addended by: Enid Cutter on: 01/25/2020 02:40 PM   Modules accepted: Orders

## 2020-01-25 NOTE — Assessment & Plan Note (Signed)
Unable to tolerate Saxenda secondary to GI upset.   -Encourage set small goals of exercise daily  -Encouraged to Call the [facility].  -Follow up as needed

## 2020-01-25 NOTE — Assessment & Plan Note (Signed)
Last migraine one week ago. Waiting to see neurology.  Referral has been sent. -Meds refilled -Continue current treatment -Follow up as needed

## 2020-01-25 NOTE — Assessment & Plan Note (Signed)
Continues to have leg swelling.  Has had Demadex in the past.   -Discussed with patient that diuretics not treatment for CVI. Would recommend discontinuing diuretics -Referral to OT for lymphedema wraps.

## 2020-02-10 ENCOUNTER — Other Ambulatory Visit: Payer: Self-pay | Admitting: Family Medicine

## 2020-02-10 DIAGNOSIS — M170 Bilateral primary osteoarthritis of knee: Secondary | ICD-10-CM

## 2020-02-10 DIAGNOSIS — G8929 Other chronic pain: Secondary | ICD-10-CM

## 2020-02-11 ENCOUNTER — Other Ambulatory Visit: Payer: Self-pay

## 2020-02-11 ENCOUNTER — Encounter: Payer: Self-pay | Admitting: Family Medicine

## 2020-02-11 ENCOUNTER — Other Ambulatory Visit: Payer: Self-pay | Admitting: Family Medicine

## 2020-02-11 ENCOUNTER — Ambulatory Visit (HOSPITAL_COMMUNITY)
Admission: RE | Admit: 2020-02-11 | Discharge: 2020-02-11 | Disposition: A | Discharge status: Home / Self Care | Payer: BC Managed Care – PPO | Source: Ambulatory Visit | Attending: Family Medicine | Admitting: Family Medicine

## 2020-02-11 ENCOUNTER — Ambulatory Visit: Payer: BC Managed Care – PPO | Admitting: Family Medicine

## 2020-02-11 VITALS — BP 152/78 | HR 84 | Ht 69.0 in | Wt >= 6400 oz

## 2020-02-11 DIAGNOSIS — L304 Erythema intertrigo: Secondary | ICD-10-CM | POA: Diagnosis not present

## 2020-02-11 DIAGNOSIS — R0789 Other chest pain: Secondary | ICD-10-CM | POA: Diagnosis not present

## 2020-02-11 DIAGNOSIS — I10 Essential (primary) hypertension: Secondary | ICD-10-CM | POA: Diagnosis not present

## 2020-02-11 DIAGNOSIS — R079 Chest pain, unspecified: Secondary | ICD-10-CM | POA: Diagnosis not present

## 2020-02-11 DIAGNOSIS — R42 Dizziness and giddiness: Secondary | ICD-10-CM

## 2020-02-11 DIAGNOSIS — Z130 Encounter for screening for diseases of the blood and blood-forming organs and certain disorders involving the immune mechanism: Secondary | ICD-10-CM

## 2020-02-11 DIAGNOSIS — R0602 Shortness of breath: Secondary | ICD-10-CM

## 2020-02-11 MED ORDER — NYSTATIN-TRIAMCINOLONE 100000-0.1 UNIT/GM-% EX OINT: 1.0000 "application " | TOPICAL_OINTMENT | Freq: Two times a day (BID) | CUTANEOUS | 0 refills | Status: DC

## 2020-02-11 MED ORDER — HYDROCODONE-ACETAMINOPHEN 5-325 MG PO TABS: 1.0000 | ORAL_TABLET | Freq: Three times a day (TID) | ORAL | 0 refills | Status: DC | PRN

## 2020-02-11 NOTE — Patient Instructions (Addendum)
It was a pleasure to see you today!  1. We will get some labs today.  If they are abnormal or we need to do something about them, I will call you.  If they are normal, I will send you a message on MyChart (if it is active) or a letter in the mail.  If you don't hear from [place] in 2 weeks, please call the office  [phone].  2. If you have chest pain and/or worsening shortness of breath or trouble breathing, please be evaluated at the nearest emergency room.  3. I recommend you follow up next week on Monday, 12/6, at 2:10 PM  4. Please return for a blood pressure check and referral for a stress test when you are feeling better.  5. Please use nystatin ointment twice a day for 1 week on the rash under your breast.  Be Well,  Dr. Leary Roca

## 2020-02-11 NOTE — Progress Notes (Signed)
SUBJECTIVE:   CHIEF COMPLAINT / HPI: dizziness, dyspnea, cp  54 yo patient presents today with 2 day history of dizziness/lightheadedness, worsening dyspnea with exertion, and intermittent chest pain. She reports one day ago she had chest pain that felt like "a pound of bricks sitting on [her] chest" while at rest. That discomfort passed quickly and she has not had that same chest pain since and is not having chest pain now. Patient was previously on lasix by outside provider for "leg swelling," however turns out she has chronic venous insufficiency, and had echo in July 2021 with normal LVEF 55-60% and moderate LVH. Lasix subsequently d/c by PCP, Dr. Clent Ridges.  She has had increasing dizziness, however. She is unable to describe if this dizziness was a sudden or insidious onset, or if it feels like the room is spinning, she only notes that she is dizzy and cannot elaborate further. Dizziness is not positional, as it is present at rest and with movement and position changes. She entered the clinic with a wheel chair today as she felt too weak to walk. Normally, she is able to walk from the car into an office. Of note, patient is morbidly obese and has had worsening deconditioning, which she notes has become increasingly worse over the last year due to working from home and the pandemic. She does not leave her house most days. She has not been on any long car rides or air planes, and has only noted usual swelling of her b/l LE that is worse throughout the day, not erythematous or warm. She also has known venous insufficiency. She denies HA, fever, sore throat, rhinorrhea, sinus congestion, cough, n/v/d, dysuria, and rash.  Intertrigo: patient has erythematous, pruritic patch under right breast, requests nystatin oint instead of cream  PERTINENT  PMH / PSH: Morbid obesity, HTN, chronic venous insufficiency, sob due to deconditioning  OBJECTIVE:   BP (!) 152/78   Pulse 84   Ht 5\' 9"  (1.753 m)   Wt  (!) 443 lb 9.6 oz (201.2 kg)   LMP 05/15/2012   SpO2 96%   BMI 65.51 kg/m   Gen: Middle-aged 07/15/2012 breathing heavily, but in no acute respiratory distress HEENT: Sclera without injection or icterus. MMM.  Neck: Supple.  Cardiac: Regular rate and rhythm. Normal S1/S2. No murmurs, rubs, or gallops appreciated.  Lungs: Clear bilaterally to ascultation. No w/r/r, increased WOB, but no accessory muscle usage Skin: erythematous patch under right breast Neuro: Cranial Nerves: II: PERRL. EOMI, no nystagmus III,IV, VI: EOMI without ptosis or diplopia.  V: Facial sensation is symmetric to touch VII: Facial movement is symmetric.  VIII: hearing is intact to voice X: Palate elevates symmetrically XI: Shoulder shrug is symmetric. XII: tongue is midline without atrophy or fasciculations.  Motor: Tone is normal. Bulk is normal. 5/5 strength was present in all four extremities.  Sensory: Sensation is symmetric to light touch in the arms and legs. Deep Tendon Reflexes: 2+ and symmetric in the biceps and patellae.  Cerebellar: FNF is intact bilaterally, HKS unable to perform Romberg: unable to assess as patient worried about falling Ext: non-pitting edema present in b/l LEs, skin intact, no ulcerations or dusky appearance Psych: Pleasant and appropriate   Depression screen [facility] 2/9 02/11/2020 02/11/2020 10/13/2019 09/22/2019 09/08/2019  Decreased Interest 0 0 1 0 0  Down, Depressed, Hopeless 0 0 0 0 0  PHQ - 2 Score 0 0 1 0 0  Altered sleeping 1 - 1 - -  Tired, decreased  energy 0 - 1 - -  Change in appetite 0 - 1 - -  Feeling bad or failure about yourself  0 - 0 - -  Trouble concentrating 1 - 0 - -  Moving slowly or fidgety/restless 0 - 0 - -  Suicidal thoughts 0 - 0 - -  PHQ-9 Score 2 - 4 - -  Difficult doing work/chores - - Somewhat difficult - -  Some recent data might be hidden   GAD 7 : Generalized Anxiety Score 02/11/2020  Nervous, Anxious, on Edge 1  Control/stop worrying 0  Worry too much -  different things 1  Trouble relaxing 1  Restless 0  Easily annoyed or irritable 1  Afraid - awful might happen 1  Total GAD 7 Score 5  Anxiety Difficulty Somewhat difficult   ASSESSMENT/PLAN:   Dizziness 2 days of dizziness with unclear history, not positional. No nystagmus present on exam, no focal neurologic deficit. DDX is broad but includes BPPV, anemia, stroke (esp posterior), HTN, dehydration or electrolyte imbalance, vasovagal syncope. Less likely to be psychogenic, although patient notes increased stress at work lately, as relatively few symptoms on screening for GAD/MDD. BP is mildly elevated, but not enough to cause concern for acute symptoms. With no episodes of syncope or near-syncope and no focal exam findings, decreases need for emergent neural imaging. I did discuss that I would prefer for more urgent work up, but patient declines going to ED and wishes to proceed with close follow up, CBC, BMP to assess for anemia or electrolyte disturbance. Scheduled follow up appointment for Monday, if patient is able to tolerate at that time, can consider doing Dix-Hallpike maneuver (could not tolerate positioning on Friday). Patient was eventually able to ambulate, albeit slowly and with great effort, but with normal balance (see below). Gave patient instructions for RTC and when to seek emergency treatment, she expressed understanding and will go to ED if any symptoms worsen, she has chest pain, syncope, change in speaking ability, or weakness/sensation change in one side of body. Can also consider referral to neuro rehab if not improved after weekend. Precepted with Dr. Deirdre Priest. - CBC, BMP  Shortness of breath Patient with SOB at baseline, acutely worsening. DDX broad and includes PE, infectious cause, worsening deconditioning, possibly structural cause. Patient with recent echo with normal EF, Well's criteria low risk (though not best use since not validated in op setting), no signs of DVT, no  signs or symptoms of respiratory infection. Eventually we were able to get patient to ambulate to bathroom and back to room with pulse ox that maintainted SpO2 >94%, making emergent cause such as PE less likely. Will obtain d-dimer. Shared-decision making with patient as she preferred not to go to ED, with normal oxygenation and able to ambulate when encouraged (albeit slowly and with great effort), suspect emergent/life-threatening cause unlikely. Most likely this is progression of baseline deconditioning. Reports she will go to ED if any worsening of symptoms develop (see above). - d-dimer  Chest wall pain Patient does not have CHF, normal EF. EKG NSR, no present chest pain, but unstable anginal type chest pain occurred yesterday at rest. Recommend patient go for evaluation at ED if chest pain of ischemic nature ("pound of bricks on chest") recurs. Recommend patient be referred for stress test when no longer feeling acutely ill. - refer for stress test in 1-3 weeks  Essential hypertension Mildly elevated SBP to 150s today. Recommend patient follow up in 1-2 weeks for BP recheck when  feeling better for optimal control.  Intertrigo Intertriginous rash under right breast. - nystatin oint BID x7d     Shirlean Mylar, MD [facility]

## 2020-02-12 LAB — BASIC METABOLIC PANEL
BUN/Creatinine Ratio: 11 (ref 9–23)
BUN: 9 mg/dL (ref 6–24)
CO2: 24 mmol/L (ref 20–29)
Calcium: 8.9 mg/dL (ref 8.7–10.2)
Chloride: 104 mmol/L (ref 96–106)
Creatinine, Ser: 0.8 mg/dL (ref 0.57–1.00)
GFR calc Af Amer: 95 mL/min/{1.73_m2} (ref >59)
GFR calc non Af Amer: 83 mL/min/{1.73_m2} (ref >59)
Glucose: 82 mg/dL (ref 65–99)
Potassium: 4.3 mmol/L (ref 3.5–5.2)
Sodium: 141 mmol/L (ref 134–144)

## 2020-02-12 LAB — CBC WITH DIFFERENTIAL/PLATELET
Basophils Absolute: 0 10*3/uL (ref 0.0–0.2)
Basos: 0 %
EOS (ABSOLUTE): 0.2 10*3/uL (ref 0.0–0.4)
Eos: 2 %
Hematocrit: 42.2 % (ref 34.0–46.6)
Hemoglobin: 13.7 g/dL (ref 11.1–15.9)
Immature Grans (Abs): 0 10*3/uL (ref 0.0–0.1)
Immature Granulocytes: 0 %
Lymphocytes Absolute: 2.5 10*3/uL (ref 0.7–3.1)
Lymphs: 28 %
MCH: 27.6 pg (ref 26.6–33.0)
MCHC: 32.5 g/dL (ref 31.5–35.7)
MCV: 85 fL (ref 79–97)
Monocytes Absolute: 0.6 10*3/uL (ref 0.1–0.9)
Monocytes: 7 %
Neutrophils Absolute: 5.8 10*3/uL (ref 1.4–7.0)
Neutrophils: 63 %
Platelets: 316 10*3/uL (ref 150–450)
RBC: 4.97 x10E6/uL (ref 3.77–5.28)
RDW: 13.1 % (ref 11.7–15.4)
WBC: 9.2 10*3/uL (ref 3.4–10.8)

## 2020-02-12 LAB — D-DIMER, QUANTITATIVE: D-DIMER: 0.64 mg/L FEU — ABNORMAL HIGH (ref 0.00–0.49)

## 2020-02-13 DIAGNOSIS — R42 Dizziness and giddiness: Secondary | ICD-10-CM | POA: Insufficient documentation

## 2020-02-13 NOTE — Assessment & Plan Note (Addendum)
2 days of dizziness with unclear history, not positional. No nystagmus present on exam, no focal neurologic deficit. DDX is broad but includes BPPV, anemia, stroke (esp posterior), HTN, dehydration or electrolyte imbalance, vasovagal syncope. Less likely to be psychogenic, although patient notes increased stress at work lately, as relatively few symptoms on screening for GAD/MDD. BP is mildly elevated, but not enough to cause concern for acute symptoms. With no episodes of syncope or near-syncope and no focal exam findings, decreases need for emergent neural imaging. I did discuss that I would prefer for more urgent work up, but patient declines going to ED and wishes to proceed with close follow up, CBC, BMP to assess for anemia or electrolyte disturbance. Scheduled follow up appointment for Monday, if patient is able to tolerate at that time, can consider doing Dix-Hallpike maneuver (could not tolerate positioning on Friday). Patient was eventually able to ambulate, albeit slowly and with great effort, but with normal balance (see below). Gave patient instructions for RTC and when to seek emergency treatment, she expressed understanding and will go to ED if any symptoms worsen, she has chest pain, syncope, change in speaking ability, or weakness/sensation change in one side of body. Can also consider referral to neuro rehab if not improved after weekend. Precepted with Dr. Deirdre Priest. - CBC, BMP

## 2020-02-13 NOTE — Assessment & Plan Note (Signed)
Mildly elevated SBP to 150s today. Recommend patient follow up in 1-2 weeks for BP recheck when feeling better for optimal control.

## 2020-02-13 NOTE — Assessment & Plan Note (Signed)
Patient does not have CHF, normal EF. EKG NSR, no present chest pain, but unstable anginal type chest pain occurred yesterday at rest. Recommend patient go for evaluation at ED if chest pain of ischemic nature ("pound of bricks on chest") recurs. Recommend patient be referred for stress test when no longer feeling acutely ill. - refer for stress test in 1-3 weeks

## 2020-02-13 NOTE — Assessment & Plan Note (Addendum)
Patient with SOB at baseline, acutely worsening. DDX broad and includes PE, infectious cause, worsening deconditioning, possibly structural cause. Patient with recent echo with normal EF, Well's criteria low risk (though not best use since not validated in op setting), no signs of DVT, no signs or symptoms of respiratory infection. Eventually we were able to get patient to ambulate to bathroom and back to room with pulse ox that maintainted SpO2 >94%, making emergent cause such as PE less likely. Will obtain d-dimer. Shared-decision making with patient as she preferred not to go to ED, with normal oxygenation and able to ambulate when encouraged (albeit slowly and with great effort), suspect emergent/life-threatening cause unlikely. Most likely this is progression of baseline deconditioning. Reports she will go to ED if any worsening of symptoms develop (see above). - d-dimer

## 2020-02-13 NOTE — Assessment & Plan Note (Signed)
Intertriginous rash under right breast. - nystatin oint BID x7d

## 2020-02-14 ENCOUNTER — Telehealth: Payer: Self-pay | Admitting: Family Medicine

## 2020-02-14 ENCOUNTER — Ambulatory Visit: Payer: BC Managed Care – PPO

## 2020-02-14 NOTE — Telephone Encounter (Signed)
Late return of d-dimer, 1.06, mildly elevated. Called patient and discussed results. She is currently still having some shortness of breath, but she is not worse than Friday and has not had more chest pain, no asymmetric leg edema/warmth, etc. It is my recommendation that with a low Well's score and an elevated d-dimer, that a CTA chest would be appropriate and the best way to obtain that is via ED. We discussed risks vs benefits, including that people can die with large PE (if that is indeed the dx). Patient acknowledged the risk, but prefers not to go to ED at this time as she is not worse and is uncomfortable with waiting many hours in waiting room with COVID (she is vaccinated). She agreed to go to ED if she had any worsening of symptoms at all. She is rescheduled for follow up on Monday with PCP, Dr. Clent Ridges. Gave her RTC instructions, she knows also how to contact emergency line if she has questions, but I encouraged her to go to ED if any worsening or new symptoms. Precepted with Dr. Deirdre Priest.  Shirlean Mylar, MD [facility] Family Medicine Residency, PGY-2

## 2020-02-21 ENCOUNTER — Encounter: Payer: Self-pay | Admitting: Family Medicine

## 2020-02-21 ENCOUNTER — Other Ambulatory Visit: Payer: Self-pay

## 2020-02-21 ENCOUNTER — Ambulatory Visit (INDEPENDENT_AMBULATORY_CARE_PROVIDER_SITE_OTHER): Payer: BC Managed Care – PPO | Admitting: Family Medicine

## 2020-02-21 DIAGNOSIS — R0602 Shortness of breath: Secondary | ICD-10-CM

## 2020-02-21 DIAGNOSIS — R42 Dizziness and giddiness: Secondary | ICD-10-CM | POA: Diagnosis not present

## 2020-02-21 DIAGNOSIS — W101XXA Fall (on)(from) sidewalk curb, initial encounter: Secondary | ICD-10-CM | POA: Diagnosis not present

## 2020-02-21 NOTE — Progress Notes (Signed)
    SUBJECTIVE:   CHIEF COMPLAINT / HPI: f/u dizziness and SOB  Dizziness and SOB improving since last visit.  Denies any chest pain. Reports dizziness worse when laying down and then getting up lasting a couple of seconds.  No weakness.    Patient tripped over curb while getting out of car coming to appointment today. She reports that she landed on her right knee and scraped her left forearm.  Did not hit her head and no LOC.  Denies any dizziness or weakness.  She was able to weight bear but need a wheelchair to help her enter the building.   PERTINENT  PMH / PSH:  Obesity  OBJECTIVE:   Pulse 84   Wt (!) 433 lb (196.4 kg) Comment: patient reported-patient could not get out of the wheelchair  LMP 05/15/2012   SpO2 97%   BMI 63.94 kg/m    General: Alert and oriented, no apparent distress  Cardiovascular: RRR with no murmurs noted Respiratory: CTA bilaterally  MSK: Upper extremity strength 5/5 bilaterally, Lower extremity strength 5/5 bilaterally, tenderness below right knee.  No point tenderness to right knee, ROM normal and able to weight bear.  No abrasions, erythema or edema worsening edema noted (chronic bilateral lymphedema)  Derm: small abrasion noted on left lateral forearm  Neuro: CNIII-XII intact.  Motor, sensory and gait normal  ASSESSMENT/PLAN:   Dizziness Unclear etiology.  Symptom now improving.  Benign neuro exam.  Recent labs unremarkable.  Patient has history of migraines possible that she is having vestibular migraines. Given that symptoms improving will continue to monitor Orthostatic vital signs normal Will continue to monitor   Shortness of breath No worsening SOB. Has history of asthma and uses albuterol.  Benign lung exam.  No hypoxia or tachypnea.   Offered sleep studied, I think she may benefit from this as SOB could be secondary to OHS. Patient declined.   Discussed weight loss Refer to Medical weight loss clinic  Fall (on)(from) sidewalk curb,  initial encounter [place] on Right knee.  Brought into clinic by wheelchair.  Able to weight bear.  No point tenderness on right knee.  No erythema or increased edema on exam.  No need to xray at this time.   -RICE -Continue mobility to prevent DVT -Patient walked out of clinic accompanied by J Fleegler without any complaints. -If symptoms worsen can consider imaging     [name], MD [facility]

## 2020-02-21 NOTE — Patient Instructions (Addendum)
Thank you for coming to see me today. It was a pleasure.   I sent a referral to Medical weight management clinic.  They will call you with an appointment  If you continue to have dizziness please let me know.    If you have any Shortness of breath or chest pain please go to the [facility] department for evaluation.  Please follow-up with PCP as needed  If you have any questions or concerns, please do not hesitate to call the office at [phone].  Best,  [name], MD Family Medicine Residency

## 2020-02-24 ENCOUNTER — Encounter: Payer: Self-pay | Admitting: Family Medicine

## 2020-02-24 DIAGNOSIS — W101XXA Fall (on)(from) sidewalk curb, initial encounter: Secondary | ICD-10-CM | POA: Insufficient documentation

## 2020-02-24 NOTE — Assessment & Plan Note (Signed)
Unclear etiology.  Symptom now improving.  Benign neuro exam.  Recent labs unremarkable.  Patient has history of migraines possible that she is having vestibular migraines. Given that symptoms improving will continue to monitor Orthostatic vital signs normal Will continue to monitor

## 2020-02-24 NOTE — Assessment & Plan Note (Signed)
Fell on Right knee.  Brought into clinic by wheelchair.  Able to weight bear.  No point tenderness on right knee.  No erythema or increased edema on exam.  No need to xray at this time.   -RICE -Continue mobility to prevent DVT -Patient walked out of clinic accompanied by J Fleegler without any complaints. -If symptoms worsen can consider imaging

## 2020-02-24 NOTE — Assessment & Plan Note (Signed)
No worsening SOB. Has history of asthma and uses albuterol.  Benign lung exam.  No hypoxia or tachypnea.   Offered sleep studied, I think she may benefit from this as SOB could be secondary to OHS. Patient declined.   Discussed weight loss Refer to Medical weight loss clinic

## 2020-02-29 ENCOUNTER — Encounter: Payer: Self-pay | Admitting: Family Medicine

## 2020-03-07 NOTE — Telephone Encounter (Signed)
Completed and was placed for refaxing 12/27  Thanks Kenney Houseman

## 2020-03-23 ENCOUNTER — Encounter: Payer: Self-pay | Admitting: Family Medicine

## 2020-03-26 ENCOUNTER — Other Ambulatory Visit: Payer: Self-pay | Admitting: Family Medicine

## 2020-03-26 DIAGNOSIS — M170 Bilateral primary osteoarthritis of knee: Secondary | ICD-10-CM

## 2020-03-26 DIAGNOSIS — G8929 Other chronic pain: Secondary | ICD-10-CM

## 2020-03-28 MED ORDER — HYDROCODONE-ACETAMINOPHEN 5-325 MG PO TABS: 1.0000 | ORAL_TABLET | Freq: Three times a day (TID) | ORAL | 0 refills | Status: DC | PRN

## 2020-03-28 MED ORDER — NYSTATIN 100000 UNIT/GM EX CREA
1.0000 | TOPICAL_CREAM | Freq: Two times a day (BID) | CUTANEOUS | 0 refills | Status: DC
Start: 2020-03-28 — End: 2020-05-19

## 2020-04-03 ENCOUNTER — Encounter: Payer: Self-pay | Admitting: Family Medicine

## 2020-04-03 NOTE — Telephone Encounter (Signed)
Do you still have the FMLA forms? I spoke with Nehemiah Settle and she can not find them in the faxed pile.

## 2020-04-03 NOTE — Telephone Encounter (Signed)
I put them in the bin to be scanned.  Kenney Houseman

## 2020-04-13 ENCOUNTER — Encounter: Payer: Self-pay | Admitting: Family Medicine

## 2020-04-24 ENCOUNTER — Other Ambulatory Visit: Payer: Self-pay | Admitting: Family Medicine

## 2020-04-24 DIAGNOSIS — M170 Bilateral primary osteoarthritis of knee: Secondary | ICD-10-CM

## 2020-04-24 DIAGNOSIS — G8929 Other chronic pain: Secondary | ICD-10-CM

## 2020-04-24 MED ORDER — HYDROCODONE-ACETAMINOPHEN 5-325 MG PO TABS: 1.0000 | ORAL_TABLET | Freq: Three times a day (TID) | ORAL | 0 refills | Status: DC | PRN

## 2020-05-18 ENCOUNTER — Other Ambulatory Visit: Payer: Self-pay | Admitting: Family Medicine

## 2020-05-19 ENCOUNTER — Other Ambulatory Visit: Payer: Self-pay | Admitting: Family Medicine

## 2020-05-19 ENCOUNTER — Encounter: Payer: Self-pay | Admitting: Family Medicine

## 2020-05-19 DIAGNOSIS — G8929 Other chronic pain: Secondary | ICD-10-CM

## 2020-05-19 DIAGNOSIS — M170 Bilateral primary osteoarthritis of knee: Secondary | ICD-10-CM

## 2020-05-19 MED ORDER — NYSTATIN 100000 UNIT/GM EX CREA: 1.0000 "application " | TOPICAL_CREAM | Freq: Two times a day (BID) | CUTANEOUS | 0 refills | Status: DC

## 2020-05-19 MED ORDER — HYDROCODONE-ACETAMINOPHEN 5-325 MG PO TABS: 1.0000 | ORAL_TABLET | Freq: Three times a day (TID) | ORAL | 0 refills | Status: DC | PRN

## 2020-05-25 ENCOUNTER — Telehealth: Payer: Self-pay | Admitting: Family Medicine

## 2020-05-25 ENCOUNTER — Emergency Department (HOSPITAL_COMMUNITY): Payer: BC Managed Care – PPO

## 2020-05-25 ENCOUNTER — Emergency Department (HOSPITAL_COMMUNITY)
Admission: EM | Admit: 2020-05-25 | Discharge: 2020-05-25 | Disposition: A | Discharge status: Home / Self Care | Payer: BC Managed Care – PPO | Attending: Emergency Medicine | Admitting: Emergency Medicine

## 2020-05-25 ENCOUNTER — Encounter (HOSPITAL_COMMUNITY): Payer: Self-pay | Admitting: Emergency Medicine

## 2020-05-25 DIAGNOSIS — R0789 Other chest pain: Secondary | ICD-10-CM | POA: Diagnosis not present

## 2020-05-25 DIAGNOSIS — M25562 Pain in left knee: Secondary | ICD-10-CM | POA: Diagnosis not present

## 2020-05-25 DIAGNOSIS — Z5321 Procedure and treatment not carried out due to patient leaving prior to being seen by health care provider: Secondary | ICD-10-CM | POA: Diagnosis not present

## 2020-05-25 DIAGNOSIS — R079 Chest pain, unspecified: Secondary | ICD-10-CM | POA: Insufficient documentation

## 2020-05-25 DIAGNOSIS — W19XXXA Unspecified fall, initial encounter: Secondary | ICD-10-CM | POA: Insufficient documentation

## 2020-05-25 DIAGNOSIS — R011 Cardiac murmur, unspecified: Secondary | ICD-10-CM | POA: Diagnosis not present

## 2020-05-25 DIAGNOSIS — R000 Tachycardia, unspecified: Secondary | ICD-10-CM | POA: Diagnosis not present

## 2020-05-25 DIAGNOSIS — M25561 Pain in right knee: Secondary | ICD-10-CM | POA: Insufficient documentation

## 2020-05-25 DIAGNOSIS — J45909 Unspecified asthma, uncomplicated: Secondary | ICD-10-CM | POA: Diagnosis not present

## 2020-05-25 LAB — BASIC METABOLIC PANEL
Anion gap: 6 (ref 5–15)
BUN: 8 mg/dL (ref 6–20)
CO2: 29 mmol/L (ref 22–32)
Calcium: 8.4 mg/dL — ABNORMAL LOW (ref 8.9–10.3)
Chloride: 103 mmol/L (ref 98–111)
Creatinine, Ser: 0.81 mg/dL (ref 0.44–1.00)
GFR, Estimated: >60 mL/min (ref >60)
Glucose, Bld: 111 mg/dL — ABNORMAL HIGH (ref 70–99)
Potassium: 3.7 mmol/L (ref 3.5–5.1)
Sodium: 138 mmol/L (ref 135–145)

## 2020-05-25 LAB — CBC
HCT: 41.7 % (ref 36.0–46.0)
Hemoglobin: 13.4 g/dL (ref 12.0–15.0)
MCH: 28.3 pg (ref 26.0–34.0)
MCHC: 32.1 g/dL (ref 30.0–36.0)
MCV: 88 fL (ref 80.0–100.0)
Platelets: 309 10*3/uL (ref 150–400)
RBC: 4.74 MIL/uL (ref 3.87–5.11)
RDW: 13.7 % (ref 11.5–15.5)
WBC: 9.9 10*3/uL (ref 4.0–10.5)
nRBC: 0 % (ref 0.0–0.2)

## 2020-05-25 LAB — TROPONIN I (HIGH SENSITIVITY): Troponin I (High Sensitivity): 5 ng/L (ref <18)

## 2020-05-25 NOTE — Telephone Encounter (Signed)
**  After-hours emergency Line call**   Catherine Powell is a 55 year old female calling into the after-hours line to discuss [name].  [name] this afternoon after falling on her knees to be evaluated for knee pain, but left due to wait.  She endorsed a chronic history of chest wall pain (unchanged), thus why cardiac work-up as below.  Reviewed her lab results including CBC, BMP, troponin, CXR, and EKG all of which are overall unremarkable.  Scheduled a follow-up appointment with [name] on 4/12 (not scheduled sooner due to preference to see PCP).  Encouraged to ice her knee frequently, elevate, and use Tylenol/ibuprofen as needed.  She is aware to follow-up sooner if she has worsening significant pain, extremity weakness, or knee instability.  Allayne Stack, DO

## 2020-05-25 NOTE — ED Triage Notes (Signed)
Patient BIB [facility] after falling while trying to get into her vehicle, denies LOC. Complains of bilateral knee pain. Patient also reports chest pain that started three months ago. Patient alert, oriented, and in no apparent distress.

## 2020-05-25 NOTE — ED Notes (Signed)
Witnessed pt getting in the car w/ help of her family members and drive off. I took pt off floor.

## 2020-05-30 ENCOUNTER — Ambulatory Visit (INDEPENDENT_AMBULATORY_CARE_PROVIDER_SITE_OTHER): Payer: Self-pay | Admitting: Family Medicine

## 2020-06-13 ENCOUNTER — Ambulatory Visit (INDEPENDENT_AMBULATORY_CARE_PROVIDER_SITE_OTHER): Payer: Self-pay | Admitting: Family Medicine

## 2020-06-15 ENCOUNTER — Other Ambulatory Visit: Payer: Self-pay | Admitting: Family Medicine

## 2020-06-15 DIAGNOSIS — G8929 Other chronic pain: Secondary | ICD-10-CM

## 2020-06-15 DIAGNOSIS — M170 Bilateral primary osteoarthritis of knee: Secondary | ICD-10-CM

## 2020-06-15 MED ORDER — NYSTATIN 100000 UNIT/GM EX CREA: 1.0000 "application " | TOPICAL_CREAM | Freq: Two times a day (BID) | CUTANEOUS | 0 refills | Status: DC

## 2020-06-15 MED ORDER — HYDROCODONE-ACETAMINOPHEN 5-325 MG PO TABS: 1.0000 | ORAL_TABLET | Freq: Three times a day (TID) | ORAL | 0 refills | Status: DC | PRN

## 2020-06-15 NOTE — Telephone Encounter (Signed)
PMP reviewed, Rx to pharmacy.  Terisa Starr, MD  Family Medicine Teaching Service

## 2020-06-15 NOTE — Addendum Note (Signed)
Addended by: Manson Passey, Jerimy Johanson on: 06/15/2020 05:54 PM   Modules accepted: Orders

## 2020-06-20 ENCOUNTER — Telehealth (INDEPENDENT_AMBULATORY_CARE_PROVIDER_SITE_OTHER): Payer: BC Managed Care – PPO | Admitting: Family Medicine

## 2020-06-20 ENCOUNTER — Encounter: Payer: Self-pay | Admitting: Family Medicine

## 2020-06-20 DIAGNOSIS — I872 Venous insufficiency (chronic) (peripheral): Secondary | ICD-10-CM | POA: Diagnosis not present

## 2020-06-20 MED ORDER — FUROSEMIDE 20 MG PO TABS: 20.0000 mg | ORAL_TABLET | Freq: Every day | ORAL | 0 refills | Status: DC | PRN

## 2020-06-20 MED ORDER — AQUAPHOR EX OINT: TOPICAL_OINTMENT | CUTANEOUS | 0 refills | Status: DC | PRN

## 2020-06-20 NOTE — Progress Notes (Signed)
[facility] Telemedicine Visit  Patient consented to have virtual visit and was identified by name and date of birth. Method of visit: Video  Encounter participants: Patient: Catherine Powell - located at home Provider: [name] - located at home office Others (if applicable): none  Chief Complaint: legs swollen  HPI:  Reports legs swollen again. No drainage or open areas.  Previously took Torsemide for increased swelling and helped.  Has not been able to schedule appointment for lymphedema wraps in [place].  Patient reports she called and left message but no returned call.  She is rescheduling appointment with Medical weight loss clinic. Denies any chest pain,SOB, worsening leg pain.  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 Has tried HCTZ ihn npast but stopped due to intolerance,caused significant back pain.  Last took Meloxicam 1 year ago and reports no NSAID use recently.  ROS: per HPI  Pertinent PMHx:  Chronic venous insufficiency Elevated BMI HTN DJD of knees  Exam:  LMP 05/15/2012   Respiratory: no SOB or IWOB, able to speak in full sentences  Assessment/Plan:  Chronic venous insufficiency Continues to have bilateral leg swelling. Low suspicion for DVT given no worsening pain and bilateral edema. Low suspicion for CHF given no symptoms,last ECHO 6/12 LVEF 55-60% without valvular disease.   -Avoid NSAIDS -Lasix 20 mg daily prn -Advised patient to continue to call for appointment for lymphedema wraps -Follow up with Medical weight loss clinic -Follow up in  1-2 weeks in clinic with PCP -Strict return precautions provided    Time spent during visit with patient: 15 minutes

## 2020-06-20 NOTE — Assessment & Plan Note (Signed)
Continues to have bilateral leg swelling. Low suspicion for DVT given no worsening pain and bilateral edema. Low suspicion for CHF given no symptoms,last ECHO 6/12 LVEF 55-60% without valvular disease.   -Avoid NSAIDS -Lasix 20 mg daily prn -Advised patient to continue to call for appointment for lymphedema wraps -Follow up with Medical weight loss clinic -Follow up in  1-2 weeks in clinic with PCP -Strict return precautions provided

## 2020-06-20 NOTE — Addendum Note (Signed)
Addended by: Manson Passey, Weylyn Ricciuti on: 06/20/2020 06:38 PM   Modules accepted: Level of Service

## 2020-06-21 ENCOUNTER — Telehealth: Payer: Self-pay

## 2020-06-21 ENCOUNTER — Other Ambulatory Visit: Payer: Self-pay | Admitting: Family Medicine

## 2020-06-21 MED ORDER — ABSORBASE EX OINT: TOPICAL_OINTMENT | CUTANEOUS | 3 refills | Status: AC

## 2020-06-21 NOTE — Telephone Encounter (Signed)
Hi Hannah, that would be fine.  When I spoke with her I thought she wanted the Aquaphor.  Thanks Kenney Houseman

## 2020-06-21 NOTE — Telephone Encounter (Signed)
Done, sent to [facility]

## 2020-06-21 NOTE — Telephone Encounter (Signed)
Patient calls nurse line requesting rx for absorbase skin cream. Patient reports that this has worked in the past and she would prefer this to the aquaphor ointment.   Please advise.   Veronda Prude, RN

## 2020-06-21 NOTE — Telephone Encounter (Signed)
Pt is requesting rx so that insurance can be billed and she can use HSA card.   Veronda Prude, RN

## 2020-07-06 ENCOUNTER — Encounter: Payer: Self-pay | Admitting: Family Medicine

## 2020-07-06 DIAGNOSIS — G43109 Migraine with aura, not intractable, without status migrainosus: Secondary | ICD-10-CM

## 2020-07-07 MED ORDER — SUMATRIPTAN SUCCINATE 50 MG PO TABS: ORAL_TABLET | ORAL | 3 refills | Status: DC

## 2020-07-09 ENCOUNTER — Telehealth: Payer: Self-pay | Admitting: Family Medicine

## 2020-07-09 NOTE — Telephone Encounter (Signed)
Letter for work absence sent via [employer].  [name], MD [facility]

## 2020-07-10 ENCOUNTER — Telehealth: Payer: Self-pay | Admitting: Family Medicine

## 2020-07-10 NOTE — Telephone Encounter (Signed)
Letter re-sent

## 2020-07-12 ENCOUNTER — Encounter: Payer: Self-pay | Admitting: Family Medicine

## 2020-07-14 ENCOUNTER — Other Ambulatory Visit: Payer: Self-pay | Admitting: Family Medicine

## 2020-07-14 DIAGNOSIS — M170 Bilateral primary osteoarthritis of knee: Secondary | ICD-10-CM

## 2020-07-14 DIAGNOSIS — G8929 Other chronic pain: Secondary | ICD-10-CM

## 2020-07-15 MED ORDER — HYDROCODONE-ACETAMINOPHEN 5-325 MG PO TABS: 1.0000 | ORAL_TABLET | Freq: Three times a day (TID) | ORAL | 0 refills | Status: DC | PRN

## 2020-07-17 NOTE — Telephone Encounter (Signed)
Placing forms in office to be faxed. Please call patient to let her know.  If she wants to pick up forms please leave at front.  I have already scanned to media. Thank you  [name], MD [facility]

## 2020-07-28 ENCOUNTER — Encounter: Payer: Self-pay | Admitting: Family Medicine

## 2020-07-29 NOTE — Telephone Encounter (Signed)
I'm not sure what she's asking me for.  Does she another FMLA completed?  Also I recommend that she see the Lymphedema clinic as I think this will help significantly.   Can you please call patient to see if she is requiring something specific from me?  Thank you [name], MD [facility]

## 2020-07-31 ENCOUNTER — Telehealth: Payer: Self-pay | Admitting: *Deleted

## 2020-07-31 NOTE — Telephone Encounter (Signed)
Contacted pt and she will be coming in on Wednesday to have her legs evaluated. Severin Bou Zimmerman Rumple, CMA

## 2020-08-01 NOTE — Progress Notes (Deleted)
    SUBJECTIVE:   CHIEF COMPLAINT / HPI:   *** Dynamed stasis dermatitis   PERTINENT  PMH / PSH: ***  OBJECTIVE:   LMP 05/15/2012   ***  ASSESSMENT/PLAN:   No problem-specific Assessment & Plan notes found for this encounter.     Melene Plan, MD [place] [facility] [facility]   {    This will disappear when note is signed, click to select method of visit    :1}

## 2020-08-02 ENCOUNTER — Ambulatory Visit: Payer: BC Managed Care – PPO | Admitting: Family Medicine

## 2020-08-14 ENCOUNTER — Other Ambulatory Visit: Payer: Self-pay | Admitting: Family Medicine

## 2020-08-14 DIAGNOSIS — G8929 Other chronic pain: Secondary | ICD-10-CM

## 2020-08-14 DIAGNOSIS — M170 Bilateral primary osteoarthritis of knee: Secondary | ICD-10-CM

## 2020-08-15 MED ORDER — HYDROCODONE-ACETAMINOPHEN 5-325 MG PO TABS: 1.0000 | ORAL_TABLET | Freq: Three times a day (TID) | ORAL | 0 refills | Status: DC | PRN

## 2020-08-21 ENCOUNTER — Other Ambulatory Visit: Payer: Self-pay

## 2020-08-21 ENCOUNTER — Ambulatory Visit: Payer: BC Managed Care – PPO | Attending: Family Medicine | Admitting: Occupational Therapy

## 2020-08-21 ENCOUNTER — Encounter: Payer: Self-pay | Admitting: Occupational Therapy

## 2020-08-21 DIAGNOSIS — I890 Lymphedema, not elsewhere classified: Secondary | ICD-10-CM | POA: Insufficient documentation

## 2020-08-22 NOTE — Therapy (Signed)
[facility] MAIN [facility] SERVICES [address] [place], [place], [address] Phone: [phone]   Fax:  [phone]  Occupational Therapy Evaluation  Patient Details  Name: Catherine Powell MRN: [id number] Date of Birth: March 26, 1965 Referring Provider (OT): Acquanetta Belling, MD   Encounter Date: 08/21/2020   OT End of Session - 08/22/20 1613     Visit Number 1    Number of Visits 36    Date for OT Re-Evaluation 11/19/20    OT Start Time 1005    OT Stop Time 1115    OT Time Calculation (min) 70 min    Activity Tolerance Patient tolerated treatment well;No increased pain    Behavior During Therapy [facility] for tasks assessed/performed             Past Medical History:  Diagnosis Date   Allergic rhinitis    Asthma    Carpal tunnel syndrome 05/08/2006   Degenerative joint disease of knees 05/01/2010   Qualifier: Diagnosis of  By: Christella Hartigan MD, Bret     Depression 12/16/2006   Qualifier: Diagnosis of  By: Irving Burton MD, [name]     ESSENTIAL HYPERTENSION 05/22/2006   Qualifier: Diagnosis of  By: Irving Burton MD, Carollee Herter REFLUX, NO ESOPHAGITIS 05/08/2006   Qualifier: Diagnosis of  By: Irving Burton MD, [name]     Heart murmur    slight due to Rheumatic fever had cardiac studies 2009 Dr. Antoine Poche   HYPERCHOLESTEROLEMIA 05/08/2006   Qualifier: Diagnosis of  By: Irving Burton MD, [name]     Lymphedema associated with obesity    Rheumatic fever    Right knee meniscal tear    and loose body   SINUSITIS, CHRONIC, NOS 05/08/2006    Past Surgical History:  Procedure Laterality Date   APPENDECTOMY     CARDIAC CATHETERIZATION     2009 Dr. Antoine Poche   CERVICAL FUSION     CHOLECYSTECTOMY     KNEE ARTHROSCOPY Right 06/17/2016   Procedure: RIGHT KNEE ARTHROSCOPY;  Surgeon: Gean Birchwood, MD;  Location: [facility] OR;  Service: Orthopedics;  Laterality: Right;   WISDOM TOOTH EXTRACTION      There were no vitals filed for this visit.   Subjective Assessment  - 08/21/20 1041     Subjective  Catherine Powell is referred to Occupational Therapy by Acquanetta Belling, MD for evaluation and treatment of BLE lymphedema. Pt is accokmpanied to clinic by her adult son, Madelaine Bhat, who pushes her in a manual transport wheelchair. Pt reports insideous onset of leg swelling over 10 years ago shortly after moving house. She has not previously undergone lymphedema therapy, and she does not wear compression stockings because they don't fit her. Pt denies known family history of leg swelling. Her goal for therapy is to reduce leg swelling and "weeping" and to be able to get in and out of the car better.    Patient is accompanied by: Family member   son, Madelaine Bhat, 93 y o   Pertinent History CVI, Morbid Obesity, HTN, DJD of knees, hx falls, hx dizziness, hx astma, hx depression and anxiety, HTN, denies DVT    Limitations difficulty walking, impaired functional mobility and transfers, chronic B leg swelling and associated pain, decreased endurance, impaired  walking balance, decreased standing tolerance, deconditioning    Repetition Increases Symptoms    Special Tests + Stemmer Bly; Patient's intake functional measure on FOTO tool  is 29 on a scale of 0 - 100 (higher number = greater function).  Given the patient's risk-adjustment variables, like-patients nationally had a FS score of 60 at intake.    Patient Stated Goals get the swelling in my legs down and get them to quit leaking fluid.    Currently in Pain? Yes    Pain Score 5     Pain Location Leg    Pain Orientation Left;Right    Pain Descriptors / Indicators Aching;Burning;Discomfort;Heaviness;Jabbing;Pressure;Restless;Sore;Stabbing;Throbbing;Tightness;Other (Comment)   fullness   Pain Type Chronic pain    Pain Onset Other (comment)   insideous onset ~ 2011-2012 when moving house   Pain Frequency Intermittent    Aggravating Factors  standing, walking, nighttime, dependent sitting    Pain Relieving Factors elevation, squeezing  fluid out ontop guaze, rubbing    Effect of Pain on Daily Activities limits all transfers, bed mobility,  limits functional ambulation, limits ability to fit LB clothing and shoes, impaired LB dressing, unable to reach feet to perform nail and skin care to perform skin inspection and to apply topical meds               [facility] - 08/22/20 0001       Assessment   Medical Diagnosis Stage III, 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 2    Has the patient had a decrease in activity level because of a fear of falling?  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    Leisure video games,      IADL   Prior Level of Function Shopping Max A   uses motorized store scooters when [employer]available   Shopping Needs to be accompanied on any shopping trip    Prior Level of Function Light Housekeeping Max A    Light Housekeeping Performs light daily tasks but cannot maintain acceptable level of cleanliness    Prior Level of Function Meal Prep Max A    Meal Prep Able to complete simple cold meal and snack prep;Able to complete simple warm meal prep    Prior Level of Function Community Mobility drives own car     Community Mobility Drives own vehicle;Relies on family or friends for transportation      Mobility   Mobility Status History of falls;Needs assist    Mobility Status Comments other factors limiting functional mobility and transfers include OA pain and body habitus limiting A/PROM      Activity Tolerance   Activity Tolerance --   decreased endurance   Sitting Balance --   [facility]  Cognition   Overall Cognitive Status Within Functional Limits for tasks assessed      Observation/Other Assessments   Outcome Measures FOTO 29/100      Posture/Postural Control   Posture/Postural Control Postural limitations    Postural Limitations Rounded Shoulders;Forward head;Increased thoracic kyphosis      Sensation   Light Touch Appears Intact      Coordination   Fine Motor Movements are Fluid and Coordinated Yes      ROM / Strength   AROM / PROM / Strength AROM;PROM;Strength      AROM   Overall AROM  Deficits;Other (comment)   limited by body habitus and skin approximation   AROM Assessment Site Hip;Knee;Ankle      PROM   Overall PROM  Deficits;Other (comment)   limited by body habitus and skin approximation   PROM Assessment Site Hip;Knee;Ankle      Strength   Overall Strength Within functional limits for tasks performed             Moderate, Stage III BLE Lymphedema  2/2  CVI and Mrbid Obesity Skin  Description Hyper-Keratosis Peau' de Orange Shiny Tight Fibrotic/ Indurated Papillomatosis  Lichenisation spongy   x x  x severe x x      Skin dry Flaky WNL Macerated   mild x     Color Redness Present Pallor Blanching Hemosiderin Staining Mottled      x     Odor Malodorous Yeast Fungal infection  Absent      x   Temperature Warm Cool wnl    x     Pitting Edema   1+ 2+ 3+ 4+ Non-pitting        x   Girth Symmetrical Asymmetrical                   Distribution     L>R Toes to groin     Stemmer Sign Positive Negative   + base of toes    Lymphorrhea History  Of:  Present Absent   x x     Wounds History Of Present Absent Venous Arterial Pressure Mechanical   x          Signs of Infection Redness Warmth Erythema Acute Swelling Drainage Borders                    Sensation Light Touch Deep pressure Hypersensitivty   Present Impaired Present Impaired Absent Impaired    x x  x     Nails WNL   Fungus nail dystrophy        Hair Growth Symmetrical Asymmetrical       Skin Creases Base of toes  Ankles   Base of Fingers Medial Thighs         Abdominal pannus Lobules  Face/neck   x x  x   x            OT Treatments/Exercises (OP) - 08/22/20 1605       Manual Therapy   Manual Therapy Edema management                   OT Education - 08/22/20 1606     Education Details Provided Pt education regarding lymphatic structure and function, etiologies, onset patterns and stages of progression. Discussed  impact of obesity on lymphatic function. Outlined Complete Decongestive Therapy (CDT)  as standard of care and provided in depth information regarding 4 primary components of both Intensive and Self Management Phases,  including Manual Lymph Drainage (MLD), compression wrapping and garments, skin care, and therapeutic exercise.   Homero Fellers discussion of high burden of care and contributing impact of existing co morbidities. We discussed  Importance of daily, ongoing LE self-care essential to retaining clinical gains and limiting progression.    Discussed need to have daily caregiver assistance with lymphedema self-care home program due to inability to reach feet and legs to perform compression bandaging, skin care and simple self MLD throughout Intensive Phase of Complete Decongestive Therapy. Without daily CG assistance with this protocol ongoing, prognosis for reducing swelling and limiting lymphedema progression is poor.  Discussed importance of medically managed weight loss plan and nutrition counseling essential for significant  weight loss before undertaking Intensive Phase Complete Decongestive Therapy forlymphedema care.    Person(s) Educated Patient;Child(ren)    Methods Explanation;Demonstration;Handout    Comprehension Verbalized understanding;Returned demonstration;Need further instruction                 OT Long Term Goals - 08/22/20 1621       OT LONG TERM GOAL #1   Title With Max family CG assistance Pt will be able to apply knee length short stretch compression wraps to one leg at a time only using gradient techniques by DC in 6 weeks to limit lymphorrhea.    Baseline dependent    Time 6    Period Weeks    Status New    Target Date --   6th OT Rx visit     OT LONG TERM GOAL #2   Title Pt modified independent with lymphedema precautions using printed reference to limit lymphedema progression and infection risk.    Baseline dependent    Time 6    Period Weeks    Status New    Target Date --   6th OT Rx visit     OT LONG TERM GOAL #3   Title Pt able to  don adjustable, Velcro style, short stretch compression compression garment alternative (Mediven CircAid) with max CG assistance by DC in order to limit lymphorrhea and infection risk while persuing weight loss goals.    Baseline dependent    Time 6    Period Weeks    Status New    Target Date --   6th OT visit                  Plan - 08/22/20 1614     Clinical Impression Statement Pt to participate in OT to learn compression wrapping and to fit with adjustable compression garments to limit lymphorrhea while focusing her attention on medical manageent of morbid obesity for significant weight loss before undergoing Complete Decongestive Therapy for lymphedema care. Son will learn to apply multilayer knee length wrap using short strect bandages to a singlle leg at a time only using gradient techniques. Pt will be fit with CircAid, adjustable , short stretch gator style adjustable wrap as alternative to bandages. Discussed need to  have daily caregiver assistance with lymphedema self-care home program due to inability to reach feet and legs to perform compression bandaging, skin care and simple self MLD throughout Intensive Phase of Complete Decongestive Therapy. Without daily CG assistance with this protocol ongoing, prognosis for reducing swelling and limiting lymphedema progression is poor.  Discussed importance of medically managed weight loss plan and nutrition counseling essential for significant weight loss before undertaking Intensive Phase Complete Decongestive Therapy for lymphedema care.    OT Occupational Profile and History Comprehensive Assessment- Review of  records and extensive additional review of physical, cognitive, psychosocial history related to current functional performance    Occupational performance deficits (Please refer to evaluation for details): ADL's;IADL's;Rest and Sleep;Work;Leisure;Social Participation;Other   life roles; body image   Body Structure / Function / Physical Skills ADL;Edema;Skin integrity;Mobility;Decreased knowledge of precautions;Decreased knowledge of use of DME;IADL    Rehab Potential Fair   fair prognosis for compression alternative fitting and fmily cg compression wrapping. Poor prognosis for limb volume refduction until Pt has undergone significant weight loiss.   Clinical Decision Making Multiple treatment options, significant modification of task necessary    Comorbidities Affecting Occupational Performance: Presence of comorbidities impacting occupational performance    Modification or Assistance to Complete Evaluation  Max significant modification of tasks or assist is necessary to complete    OT Frequency Other (comment)   6 visits for family edu for multilayer gradient compression wraps, and to fit with bilateral, knee length, adjustable, compression alternative to limit lymphorrhea while devoting majority of time and energy t significant weight loss before commencing CDT    OT Duration 6 weeks    OT Treatment/Interventions Self-care/ADL training;Therapeutic exercise;DME and/or AE instruction;Compression bandaging    Recommended Other Services Postpone lymphedema care until significant weight loss is achieved for better than poor prognosis             Patient will benefit from skilled therapeutic intervention in order to improve the following deficits and impairments:   Body Structure / Function / Physical Skills: ADL, Edema, Skin integrity, Mobility, Decreased knowledge of precautions, Decreased knowledge of use of DME, IADL       Visit Diagnosis: Lymphedema, not elsewhere classified - Plan: Ot plan of care cert/re-cert    Problem List Patient Active Problem List   Diagnosis Date Noted   Fall (on)(from) sidewalk curb, initial encounter 02/24/2020   Dizziness 02/13/2020   Chest wall pain 09/25/2019   Acute cystitis without hematuria 04/02/2019   Shortness of breath 04/02/2019   Otitis media 10/06/2018   Dry eyes, bilateral 10/06/2018   Otitis, externa, infective 06/12/2018   Acute right-sided low back pain without sciatica 09/03/2017   Asthma, chronic, mild persistent, uncomplicated 09/03/2017   Lymphedema 04/29/2017   Candidiasis, intertrigo 12/05/2016   Acute medial meniscus tear of right knee 06/15/2016   Chronic venous insufficiency 06/27/2015   Vitamin D deficiency 05/11/2014   Encounter for chronic pain management 05/11/2014   Intertrigo 06/30/2013   Degenerative joint disease of knees 05/01/2010   Cervical radiculopathy 08/06/2007   Migraine 11/19/2006   Essential hypertension 05/22/2006   HYPERCHOLESTEROLEMIA 05/08/2006   Morbid obesity (HCC) 05/08/2006   Allergic rhinitis 05/08/2006   GASTROESOPHAGEAL REFLUX, NO ESOPHAGITIS 05/08/2006   Loel Dubonnet, MS, OTR/L, CLT-LANA 08/22/20 4:41 PM   [place] [facility] MAIN [facility] SERVICES [address] [place], [place], [address] Phone: [phone]    Fax:  [phone]  Name: DAKAYLA DISANTI MRN: [id number] Date of Birth: 10/02/1965

## 2020-08-22 NOTE — Patient Instructions (Addendum)

## 2020-08-22 NOTE — Therapy (Deleted)
[facility] MAIN [facility] SERVICES [address] [place], [place], [address] Phone: [phone]   Fax:  [phone]  Occupational Therapy Treatment  Patient Details  Name: Catherine Powell MRN: [id number] Date of Birth: Jul 27, 1965 Referring Provider (OT): Acquanetta Belling, MD   Encounter Date: 08/21/2020   OT End of Session - 08/22/20 1613     Visit Number 1    Number of Visits 36    Date for OT Re-Evaluation 11/19/20    OT Start Time 1005    OT Stop Time 1115    OT Time Calculation (min) 70 min    Activity Tolerance Patient tolerated treatment well;No increased pain    Behavior During Therapy [facility] for tasks assessed/performed             Past Medical History:  Diagnosis Date   Allergic rhinitis    Asthma    Carpal tunnel syndrome 05/08/2006   Degenerative joint disease of knees 05/01/2010   Qualifier: Diagnosis of  By: Christella Hartigan MD, Bret     Depression 12/16/2006   Qualifier: Diagnosis of  By: Irving Burton MD, [name]     ESSENTIAL HYPERTENSION 05/22/2006   Qualifier: Diagnosis of  By: Irving Burton MD, Carollee Herter REFLUX, NO ESOPHAGITIS 05/08/2006   Qualifier: Diagnosis of  By: Irving Burton MD, [name]     Heart murmur    slight due to Rheumatic fever had cardiac studies 2009 Dr. Antoine Poche   HYPERCHOLESTEROLEMIA 05/08/2006   Qualifier: Diagnosis of  By: Irving Burton MD, [name]     Lymphedema associated with obesity    Rheumatic fever    Right knee meniscal tear    and loose body   SINUSITIS, CHRONIC, NOS 05/08/2006    Past Surgical History:  Procedure Laterality Date   APPENDECTOMY     CARDIAC CATHETERIZATION     2009 Dr. Antoine Poche   CERVICAL FUSION     CHOLECYSTECTOMY     KNEE ARTHROSCOPY Right 06/17/2016   Procedure: RIGHT KNEE ARTHROSCOPY;  Surgeon: Gean Birchwood, MD;  Location: [facility] OR;  Service: Orthopedics;  Laterality: Right;   WISDOM TOOTH EXTRACTION      There were no vitals filed for this visit.   Subjective Assessment  - 08/21/20 1041     Subjective  Catherine Powell is referred to Occupational Therapy by Acquanetta Belling, MD for evaluation and treatment of BLE lymphedema. Pt is accokmpanied to clinic by her adult son, Madelaine Bhat, who pushes her in a manual transport wheelchair. Pt reports insideous onset of leg swelling over 10 years ago shortly after moving house. She has not previously undergone lymphedema therapy, and she does not wear compression stockings because they don't fit her. Pt denies known family history of leg swelling. Her goal for therapy is to reduce leg swelling and "weeping" and to be able to get in and out of the car better.    Patient is accompanied by: Family member   son, Madelaine Bhat, 18 y o   Pertinent History CVI, Morbid Obesity, HTN, DJD of knees, hx falls, hx dizziness, hx astma, hx depression and anxiety, HTN, denies DVT    Limitations difficulty walking, impaired functional mobility and transfers, chronic B leg swelling and associated pain, decreased endurance, impaired  walking balance, decreased standing tolerance, deconditioning    Repetition Increases Symptoms    Special Tests + Stemmer Bly; Patient's intake functional measure on FOTO tool  is 29 on a scale of 0 - 100 (higher number = greater function).  Given the patient's risk-adjustment variables, like-patients nationally had a FS score of 60 at intake.    Patient Stated Goals get the swelling in my legs down and get them to quit leaking fluid.    Currently in Pain? Yes    Pain Score 5     Pain Location Leg    Pain Orientation Left;Right    Pain Descriptors / Indicators Aching;Burning;Discomfort;Heaviness;Jabbing;Pressure;Restless;Sore;Stabbing;Throbbing;Tightness;Other (Comment)   fullness   Pain Type Chronic pain    Pain Onset Other (comment)   insideous onset ~ 2011-2012 when moving house   Pain Frequency Intermittent    Aggravating Factors  standing, walking, nighttime, dependent sitting    Pain Relieving Factors elevation, squeezing  fluid out ontop guaze, rubbing    Effect of Pain on Daily Activities limits all transfers, bed mobility,  limits functional ambulation, limits ability to fit LB clothing and shoes, impaired LB dressing, unable to reach feet to perform nail and skin care to perform skin inspection and to apply topical meds                [facility] OT Assessment - 08/22/20 0001       Assessment   Medical Diagnosis Stage III, 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 2    Has the patient had a decrease in activity level because of a fear of falling?  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    Leisure video games,      IADL   Prior Level of Function Shopping Max A   uses motorized store scooters when [employer] Needs to be accompanied on any shopping trip    Prior Level of Function Light Housekeeping Max A    Light Housekeeping Performs light daily tasks but cannot maintain acceptable level of cleanliness    Prior Level of Function Meal Prep Max A    Meal Prep Able to complete simple cold meal and snack prep;Able to complete simple warm meal prep    Prior Level of Function Community Mobility drives own car     Community Mobility Drives own vehicle;Relies on family or friends for transportation      Mobility   Mobility Status History of falls;Needs assist    Mobility Status Comments other factors limiting functional mobility and transfers include OA pain and body habitus limiting A/PROM      Activity Tolerance   Activity Tolerance --   decreased endurance   Sitting Balance --  WFL     Cognition   Overall Cognitive Status Within Functional Limits for tasks assessed      Observation/Other Assessments   Outcome Measures FOTO 29/100      Posture/Postural Control   Posture/Postural Control Postural limitations    Postural Limitations Rounded Shoulders;Forward head;Increased thoracic kyphosis      Sensation   Light Touch Appears Intact      Coordination   Fine Motor Movements are Fluid and Coordinated Yes      ROM / Strength   AROM / PROM / Strength AROM;PROM;Strength      AROM   Overall AROM  Deficits;Other (comment)   limited by body habitus and skin approximation   AROM Assessment Site Hip;Knee;Ankle      PROM   Overall PROM  Deficits;Other (comment)   limited by body habitus and skin approximation   PROM Assessment Site Hip;Knee;Ankle      Strength   Overall Strength Within functional limits for tasks performed             Moderae, Stage III BLE Lymphedema  2/2  CVI and Mrbid Obesity Skin  Description Hyper-Keratosis Peau' de Orange Shiny Tight Fibrotic/ Indurated Papillomatosis  Lichenisation spongy   x x  x severe x x      Skin dry Flaky WNL Macerated   mild x     Color Redness Present Pallor Blanching Hemosiderin Staining Mottled      x     Odor Malodorous Yeast Fungal infection  Absent      x   Temperature Warm Cool wnl    x     Pitting Edema   1+ 2+ 3+ 4+ Non-pitting        x   Girth Symmetrical Asymmetrical                   Distribution     L>R Toes to groin     Stemmer Sign Positive Negative   + base of toes    Lymphorrhea History  Of:  Present Absent   x x     Wounds History Of Present Absent Venous Arterial Pressure Mechanical   x          Signs of Infection Redness Warmth Erythema Acute Swelling Drainage Borders                    Sensation Light Touch Deep pressure Hypersensitivty   Present Impaired Present Impaired Absent Impaired    x x  x     Nails WNL   Fungus nail dystrophy        Hair Growth Symmetrical Asymmetrical       Skin Creases Base of toes  Ankles   Base of Fingers Medial Thighs         Abdominal pannus Lobules  Face/neck   x x  x   x            OT Treatments/Exercises (OP) - 08/22/20 1605       Manual Therapy   Manual Therapy Edema management                    OT Education - 08/22/20 1606     Education Details Provided Pt education regarding lymphatic structure and function, etiologies, onset patterns and stages of progression. Discussed  impact of obesity on lymphatic function. Outlined Complete Decongestive Therapy (CDT)  as standard of care and provided in depth information regarding 4 primary components of  both Intensive and Self Management Phases, including Manual Lymph Drainage (MLD), compression wrapping and garments, skin care, and therapeutic exercise.   Homero Fellers discussion of high burden of care and contributing impact of existing co morbidities. We discussed  Importance of daily, ongoing LE self-care essential to retaining clinical gains and limiting progression.    Discussed need to have daily caregiver assistance with lymphedema self-care home program due to inability to reach feet and legs to perform compression bandaging, skin care and simple self MLD throughout Intensive Phase of Complete Decongestive Therapy. Without daily CG assistance with this protocol ongoing, prognosis for reducing swelling and limiting lymphedema progression is poor.  Discussed importance of medically managed weight loss plan and nutrition counseling essential for significant  weight loss before undertaking Intensive Phase Complete Decongestive Therapy forlymphedema care.    Person(s) Educated Patient;Child(ren)    Methods Explanation;Demonstration;Handout    Comprehension Verbalized understanding;Returned demonstration;Need further instruction                 OT Long Term Goals - 08/22/20 1621       OT LONG TERM GOAL #1   Title With Max family CG assistance Pt will be able to apply knee length short stretch compression wraps to one leg at a time only using gradient techniques by DC in 6 weeks to limit lymphorrhea.    Baseline dependent    Time 6    Period Weeks    Status New    Target Date --   6th OT Rx visit     OT LONG TERM GOAL #2   Title Pt modified independent with lymphedema precautions using printed reference to limit lymphedema progression and infection risk.    Baseline dependent    Time 6    Period Weeks    Status New    Target Date --   6th OT Rx visit     OT LONG TERM GOAL #3   Title Pt able to  don adjustable, Velcro style, short stretch compression compression garment alternative (Mediven CircAid) with max CG assistance by DC in order to limit lymphorrhea and infection risk while persuing weight loss goals.    Baseline dependent    Time 6    Period Weeks    Status New    Target Date --   6th OT visit                  Plan - 08/22/20 1614     OT Occupational Profile and History Comprehensive Assessment- Review of records and extensive additional review of physical, cognitive, psychosocial history related to current functional performance    Occupational performance deficits (Please refer to evaluation for details): ADL's;IADL's;Rest and Sleep;Work;Leisure;Social Participation;Other   life roles; body image   Body Structure / Function / Physical Skills ADL;Edema;Skin integrity;Mobility;Decreased knowledge of precautions;Decreased knowledge of use of DME;IADL    Rehab Potential Fair   fair prognosis for compression  alternative fitting and fmily cg compression wrapping. Poor prognosis for limb volume refduction until Pt has undergone significant weight loiss.   Clinical Decision Making Multiple treatment options, significant modification of task necessary    Comorbidities Affecting Occupational Performance: Presence of comorbidities impacting occupational performance    Modification or Assistance to Complete Evaluation  Max significant modification of tasks or assist is necessary to complete    OT Frequency Other (comment)   6 visits for family edu for multilayer gradient compression wraps, and to fit with bilateral, knee length, adjustable, compression alternative to  limit lymphorrhea while devoting majority of time and energy t significant weight loss before commencing CDT   OT Duration 6 weeks    OT Treatment/Interventions Self-care/ADL training;Therapeutic exercise;DME and/or AE instruction;Compression bandaging    Recommended Other Services Postpone lymphedema care until significant weight loss is achieved for better than poor prognosis             Patient will benefit from skilled therapeutic intervention in order to improve the following deficits and impairments:   Body Structure / Function / Physical Skills: ADL, Edema, Skin integrity, Mobility, Decreased knowledge of precautions, Decreased knowledge of use of DME, IADL       Visit Diagnosis: Lymphedema, not elsewhere classified - Plan: Ot plan of care cert/re-cert    Problem List Patient Active Problem List   Diagnosis Date Noted   Fall (on)(from) sidewalk curb, initial encounter 02/24/2020   Dizziness 02/13/2020   Chest wall pain 09/25/2019   Acute cystitis without hematuria 04/02/2019   Shortness of breath 04/02/2019   Otitis media 10/06/2018   Dry eyes, bilateral 10/06/2018   Otitis, externa, infective 06/12/2018   Acute right-sided low back pain without sciatica 09/03/2017   Asthma, chronic, mild persistent, uncomplicated  09/03/2017   Lymphedema 04/29/2017   Candidiasis, intertrigo 12/05/2016   Acute medial meniscus tear of right knee 06/15/2016   Chronic venous insufficiency 06/27/2015   Vitamin D deficiency 05/11/2014   Encounter for chronic pain management 05/11/2014   Intertrigo 06/30/2013   Degenerative joint disease of knees 05/01/2010   Cervical radiculopathy 08/06/2007   Migraine 11/19/2006   Essential hypertension 05/22/2006   HYPERCHOLESTEROLEMIA 05/08/2006   Morbid obesity (HCC) 05/08/2006   Allergic rhinitis 05/08/2006   GASTROESOPHAGEAL REFLUX, NO ESOPHAGITIS 05/08/2006    Loel Dubonnetheresa Marios Gaiser, MS, OTR/L, CLT-LANA 08/22/20 4:35 PM   [facility] [facility] MAIN [facility] [address] [place], [place], [address] Phone: [phone]   Fax:  [phone]  Name: Catherine FritzBeverly H Powell MRN: [id number] Date of Birth: 03/10/1966

## 2020-08-24 ENCOUNTER — Other Ambulatory Visit: Payer: Self-pay

## 2020-08-24 ENCOUNTER — Ambulatory Visit: Payer: BC Managed Care – PPO | Admitting: Occupational Therapy

## 2020-08-24 DIAGNOSIS — I890 Lymphedema, not elsewhere classified: Secondary | ICD-10-CM | POA: Diagnosis not present

## 2020-08-24 NOTE — Therapy (Signed)
[facility] MAIN [facility] SERVICES [address] [place], [place], [address] Phone: [phone]   Fax:  [phone]  Occupational Therapy Treatment  Patient Details  Name: Catherine Powell MRN: [id number] Date of Birth: 09/25/1965 Referring Provider (OT): Acquanetta Belling, MD   Encounter Date: 08/24/2020   OT End of Session - 08/24/20 0811     Visit Number 2    Number of Visits 36    Date for OT Re-Evaluation 11/19/20    OT Start Time 0804    OT Stop Time 0910    OT Time Calculation (min) 66 min    Activity Tolerance Patient tolerated treatment well;No increased pain    Behavior During Therapy [facility] for tasks assessed/performed             Past Medical History:  Diagnosis Date   Allergic rhinitis    Asthma    Carpal tunnel syndrome 05/08/2006   Degenerative joint disease of knees 05/01/2010   Qualifier: Diagnosis of  By: Christella Hartigan MD, Bret     Depression 12/16/2006   Qualifier: Diagnosis of  By: Irving Burton MD, [name]     ESSENTIAL HYPERTENSION 05/22/2006   Qualifier: Diagnosis of  By: Irving Burton MD, Carollee Herter REFLUX, NO ESOPHAGITIS 05/08/2006   Qualifier: Diagnosis of  By: Irving Burton MD, [name]     Heart murmur    slight due to Rheumatic fever had cardiac studies 2009 Dr. Antoine Poche   HYPERCHOLESTEROLEMIA 05/08/2006   Qualifier: Diagnosis of  By: Irving Burton MD, [name]     Lymphedema associated with obesity    Rheumatic fever    Right knee meniscal tear    and loose body   SINUSITIS, CHRONIC, NOS 05/08/2006    Past Surgical History:  Procedure Laterality Date   APPENDECTOMY     CARDIAC CATHETERIZATION     2009 Dr. Antoine Poche   CERVICAL FUSION     CHOLECYSTECTOMY     KNEE ARTHROSCOPY Right 06/17/2016   Procedure: RIGHT KNEE ARTHROSCOPY;  Surgeon: Gean Birchwood, MD;  Location: [facility] OR;  Service: Orthopedics;  Laterality: Right;   WISDOM TOOTH EXTRACTION      There were no vitals filed for this visit.   Subjective Assessment  - 08/24/20 1033     Subjective  [name] presents for OT vist 1/6 in transport wc . She is accompanied by her son, Catherine Powell. LE related pain is unchanged since initial eval earlier in the week. Pt reports mild RLE lymphorrhea this morning at posterior calf.    Patient is accompanied by: Family member   son, Catherine Powell, 43 y o   Pertinent History CVI, Morbid Obesity, HTN, DJD of knees, hx falls, hx dizziness, hx astma, hx depression and anxiety, HTN, denies DVT    Limitations difficulty walking, impaired functional mobility and transfers, chronic B leg swelling and associated pain, decreased endurance, impaired  walking balance, decreased standing tolerance, deconditioning    Repetition Increases Symptoms    Special Tests + Stemmer Bly; Patient's intake functional measure on FOTO tool  is 29 on a scale of 0 - 100 (higher number = greater function).  Given the patient's risk-adjustment variables, like-patients nationally had a FS score of 60 at intake.    Patient Stated Goals get the swelling in my legs down and get them to quit leaking fluid.    Pain Onset Other (comment)   insideous onset ~ 2011-2012 when moving house  LYMPHEDEMA/ONCOLOGY QUESTIONNAIRE - 08/24/20 0001       Lymphedema Assessments   Lymphedema Assessments Lower extremities      Right Lower Extremity Lymphedema   Other RLE limb volume from ankle to popliteal fossa (A-D) measures 11382.0 ml                     OT Treatments/Exercises (OP) - 08/24/20 1036       ADLs   ADL Education Given Yes      Manual Therapy   Manual Therapy Edema management;Compression Bandaging    Edema Management Initial RLE comparative limb volumetrics    Compression Bandaging RLE Multi  layer compression wrap using gradient techniques  from base of toes to popliteal fossa using 0.4 cm thick Ssidal foam over knee length cotton stockinet, then 1 each 8 and 10 cm wide short stretch bandage, and 2 12 cm wide ss wraps. good  tolerance in clinic.                    OT Education - 08/24/20 1039     Education Details Pt and CG edu for purpose of limb volumetrics, and for sort stretch gradient compression wrapping.    Person(s) Educated Patient;Child(ren)    Methods Explanation;Demonstration;Handout    Comprehension Verbalized understanding;Returned demonstration;Need further instruction                 OT Long Term Goals - 08/22/20 1621       OT LONG TERM GOAL #1   Title With Max family CG assistance Pt will be able to apply knee length short stretch compression wraps to one leg at a time only using gradient techniques by DC in 6 weeks to limit lymphorrhea.    Baseline dependent    Time 6    Period Weeks    Status New    Target Date --   6th OT Rx visit     OT LONG TERM GOAL #2   Title Pt modified independent with lymphedema precautions using printed reference to limit lymphedema progression and infection risk.    Baseline dependent    Time 6    Period Weeks    Status New    Target Date --   6th OT Rx visit     OT LONG TERM GOAL #3   Title Pt able to  don adjustable, Velcro style, short stretch compression compression garment alternative (Mediven CircAid) with max CG assistance by DC in order to limit lymphorrhea and infection risk while persuing weight loss goals.    Baseline dependent    Time 6    Period Weeks    Status New    Target Date --   6th OT visit                  Plan - 08/24/20 1018     Clinical Impression Statement Completed initial RLE comparative limb volumetrics, which reveal initial R leg volume( ankle to popliteal fossa, A-D)  measures 11,382.0 ml. Emphasis of first OT Rx visit, OT visit 1/6, was on Pt and family  caregiver edu for multilayer compression wrapping using gradient techniques. By end of session CG able to apply 6 layer wrap w max A. Cont  teaching wrapping next session and comple LLE volumetrics.    OT Occupational Profile and History  Comprehensive Assessment- Review of records and extensive additional review of physical, cognitive, psychosocial history related to current functional performance    Occupational performance  deficits (Please refer to evaluation for details): ADL's;IADL's;Rest and Sleep;Work;Leisure;Social Participation;Other   life roles; body image   Body Structure / Function / Physical Skills ADL;Edema;Skin integrity;Mobility;Decreased knowledge of precautions;Decreased knowledge of use of DME;IADL    Rehab Potential Fair   fair prognosis for compression alternative fitting and fmily cg compression wrapping. Poor prognosis for limb volume refduction until Pt has undergone significant weight loiss.   Clinical Decision Making Multiple treatment options, significant modification of task necessary    Comorbidities Affecting Occupational Performance: Presence of comorbidities impacting occupational performance    Modification or Assistance to Complete Evaluation  Max significant modification of tasks or assist is necessary to complete    OT Frequency Other (comment)   6 visits for family edu for multilayer gradient compression wraps, and to fit with bilateral, knee length, adjustable, compression alternative to limit lymphorrhea while devoting majority of time and energy t significant weight loss before commencing CDT   OT Duration 6 weeks    OT Treatment/Interventions Self-care/ADL training;Therapeutic exercise;DME and/or AE instruction;Compression bandaging    Recommended Other Services Postpone lymphedema care until significant weight loss is achieved for better than poor prognosis             Patient will benefit from skilled therapeutic intervention in order to improve the following deficits and impairments:   Body Structure / Function / Physical Skills: ADL, Edema, Skin integrity, Mobility, Decreased knowledge of precautions, Decreased knowledge of use of DME, IADL       Visit Diagnosis: Lymphedema, not  elsewhere classified    Problem List Patient Active Problem List   Diagnosis Date Noted   Fall (on)(from) sidewalk curb, initial encounter 02/24/2020   Dizziness 02/13/2020   Chest wall pain 09/25/2019   Acute cystitis without hematuria 04/02/2019   Shortness of breath 04/02/2019   Otitis media 10/06/2018   Dry eyes, bilateral 10/06/2018   Otitis, externa, infective 06/12/2018   Acute right-sided low back pain without sciatica 09/03/2017   Asthma, chronic, mild persistent, uncomplicated 09/03/2017   Lymphedema 04/29/2017   Candidiasis, intertrigo 12/05/2016   Acute medial meniscus tear of right knee 06/15/2016   Chronic venous insufficiency 06/27/2015   Vitamin D deficiency 05/11/2014   Encounter for chronic pain management 05/11/2014   Intertrigo 06/30/2013   Degenerative joint disease of knees 05/01/2010   Cervical radiculopathy 08/06/2007   Migraine 11/19/2006   Essential hypertension 05/22/2006   HYPERCHOLESTEROLEMIA 05/08/2006   Morbid obesity (HCC) 05/08/2006   Allergic rhinitis 05/08/2006   GASTROESOPHAGEAL REFLUX, NO ESOPHAGITIS 05/08/2006    Loel Dubonnet, MS, OTR/L, [facility] 08/24/20 10:42 AM    [place] [facility] MAIN [facility] SERVICES [address] [place], [place], [address] Phone: [phone]   Fax:  [phone]  Name: LEEAN AMEZCUA MRN: [id number] Date of Birth: [birth year]

## 2020-08-25 ENCOUNTER — Ambulatory Visit: Payer: BC Managed Care – PPO | Admitting: Family Medicine

## 2020-08-25 ENCOUNTER — Encounter: Payer: BC Managed Care – PPO | Admitting: Occupational Therapy

## 2020-08-28 ENCOUNTER — Ambulatory Visit: Payer: BC Managed Care – PPO | Admitting: Occupational Therapy

## 2020-08-30 ENCOUNTER — Other Ambulatory Visit: Payer: Self-pay

## 2020-08-30 MED ORDER — TRIAMCINOLONE ACETONIDE 0.5 % EX OINT: 1.0000 "application " | TOPICAL_OINTMENT | Freq: Two times a day (BID) | CUTANEOUS | 1 refills | Status: DC

## 2020-08-30 NOTE — Addendum Note (Signed)
Addended by: Veronda Prude on: 08/30/2020 03:08 PM   Modules accepted: Orders

## 2020-08-30 NOTE — Telephone Encounter (Signed)
Patient calls nurse line requesting that rx be transferred from [facility] to local pharmacy. Called and canceled at [facility]. Sent into [facility].  Patient is also requesting rx for hydrocortisone cream. Patient reports that she uses this for itching related to lymphedema. Medication is not on current med list.   Please advise.   Veronda Prude, RN

## 2020-08-31 ENCOUNTER — Ambulatory Visit: Payer: BC Managed Care – PPO | Admitting: Occupational Therapy

## 2020-08-31 ENCOUNTER — Other Ambulatory Visit: Payer: Self-pay

## 2020-08-31 DIAGNOSIS — I890 Lymphedema, not elsewhere classified: Secondary | ICD-10-CM

## 2020-08-31 NOTE — Therapy (Signed)
[facility] MAIN [facility] SERVICES [address] [place], [place], [address] Phone: [phone]   Fax:  [phone]  Occupational Therapy Treatment  Patient Details  Name: Catherine Powell MRN: [id number] Date of Birth: 1965/12/20 Referring Provider (OT): Acquanetta Belling, MD   Encounter Date: 08/31/2020   OT End of Session - 08/31/20 0808     Visit Number 3    Number of Visits 36    Date for OT Re-Evaluation 11/19/20    OT Start Time 0800    OT Stop Time 0905    OT Time Calculation (min) 65 min    Activity Tolerance Patient tolerated treatment well;No increased pain    Behavior During Therapy [facility] for tasks assessed/performed             Past Medical History:  Diagnosis Date   Allergic rhinitis    Asthma    Carpal tunnel syndrome 05/08/2006   Degenerative joint disease of knees 05/01/2010   Qualifier: Diagnosis of  By: Christella Hartigan MD, Bret     Depression 12/16/2006   Qualifier: Diagnosis of  By: Irving Burton MD, [name]     ESSENTIAL HYPERTENSION 05/22/2006   Qualifier: Diagnosis of  By: Irving Burton MD, Carollee Herter REFLUX, NO ESOPHAGITIS 05/08/2006   Qualifier: Diagnosis of  By: Irving Burton MD, [name]     Heart murmur    slight due to Rheumatic fever had cardiac studies 2009 Dr. Antoine Poche   HYPERCHOLESTEROLEMIA 05/08/2006   Qualifier: Diagnosis of  By: Irving Burton MD, [name]     Lymphedema associated with obesity    Rheumatic fever    Right knee meniscal tear    and loose body   SINUSITIS, CHRONIC, NOS 05/08/2006    Past Surgical History:  Procedure Laterality Date   APPENDECTOMY     CARDIAC CATHETERIZATION     2009 Dr. Antoine Poche   CERVICAL FUSION     CHOLECYSTECTOMY     KNEE ARTHROSCOPY Right 06/17/2016   Procedure: RIGHT KNEE ARTHROSCOPY;  Surgeon: Gean Birchwood, MD;  Location: [facility] OR;  Service: Orthopedics;  Laterality: Right;   WISDOM TOOTH EXTRACTION      There were no vitals filed for this visit.   Subjective Assessment  - 08/31/20 0809     Subjective  [name] presents for OT vist 3/6 in transport wc . She is accompanied by her son, Catherine Powell. She reports bilateral knee pain 6/10 this morning. . Pt reports that lymphorrhea is reduced on R side. She feels the R leg went down a little with the wraps. Tolerance is limited by itching. Her son reports difficulty applying wraps due CTS. Adam reports he needs more practice wrapping.    Patient is accompanied by: Family member   son, Catherine Powell, 55 y o   Pertinent History CVI, Morbid Obesity, HTN, DJD of knees, hx falls, hx dizziness, hx astma, hx depression and anxiety, HTN, denies DVT    Limitations difficulty walking, impaired functional mobility and transfers, chronic B leg swelling and associated pain, decreased endurance, impaired  walking balance, decreased standing tolerance, deconditioning    Repetition Increases Symptoms    Special Tests + Stemmer Bly; Patient's intake functional measure on FOTO tool  is 29 on a scale of 0 - 100 (higher number = greater function).  Given the patient's risk-adjustment variables, like-patients nationally had a FS score of 60 at intake.    Patient Stated Goals get the swelling in my legs down and get them to quit leaking fluid.  Pain Onset Other (comment)   insideous onset ~ 2011-2012 when moving house                         OT Treatments/Exercises (OP) - 08/31/20 0813       ADLs   ADL Education Given Yes (P)       Manual Therapy   Manual Therapy Edema management;Compression Bandaging (P)     Compression Bandaging RLE Multi  layer compression wrap using gradient techniques  from base of toes to popliteal fossa using 0.4 cm thick Ssidal foam over knee length cotton stockinet, then 1 each 8 and 10 cm wide short stretch bandage, and 2 12 cm wide ss wraps. good tolerance in clinic. (P)                     OT Education - 08/31/20 1251     Education Details Cont Pt and CG edu for short stretch gradient  compression wrapping to knee on RLE. Good return.    Person(s) Educated Patient;Child(ren)    Methods Explanation;Demonstration;Handout    Comprehension Verbalized understanding;Returned demonstration;Need further instruction                 OT Long Term Goals - 08/22/20 1621       OT LONG TERM GOAL #1   Title With Max family CG assistance Pt will be able to apply knee length short stretch compression wraps to one leg at a time only using gradient techniques by DC in 6 weeks to limit lymphorrhea.    Baseline dependent    Time 6    Period Weeks    Status New    Target Date --   6th OT Rx visit     OT LONG TERM GOAL #2   Title Pt modified independent with lymphedema precautions using printed reference to limit lymphedema progression and infection risk.    Baseline dependent    Time 6    Period Weeks    Status New    Target Date --   6th OT Rx visit     OT LONG TERM GOAL #3   Title Pt able to  don adjustable, Velcro style, short stretch compression compression garment alternative (Mediven CircAid) with max CG assistance by DC in order to limit lymphorrhea and infection risk while persuing weight loss goals.    Baseline dependent    Time 6    Period Weeks    Status New    Target Date --   6th OT visit                  Plan - 08/31/20 1241     Clinical Impression Statement Continued teaching knee length, multilayer compression wraping to RLE using gradient techniques. No modificatins needed today, but addedtwo 10 cm wide rolls of  Artiflex at distal ankle to assist with keeping wraps from sliding down and bunching a ankle since Pt has very cone-shaped leg at present. Pt's greatest challenge with tolerance for wraps during interval was itchng. She'll ask pharmacist for recommendation for an over-the countrer anti-itching lotion , or powder. Caregiver's obstacle is CTS bilaterally. He states his hands are painful with pulling mesh over wraps. We made a smart phone video  of wrapping to serve as a reference at home. By end of session CG able to apply wraps with min assista. Cont as per POC.    OT Occupational Profile and History Comprehensive Assessment-  Review of records and extensive additional review of physical, cognitive, psychosocial history related to current functional performance    Occupational performance deficits (Please refer to evaluation for details): ADL's;IADL's;Rest and Sleep;Work;Leisure;Social Participation;Other   life roles; body image   Body Structure / Function / Physical Skills ADL;Edema;Skin integrity;Mobility;Decreased knowledge of precautions;Decreased knowledge of use of DME;IADL    Rehab Potential Fair   fair prognosis for compression alternative fitting and fmily cg compression wrapping. Poor prognosis for limb volume refduction until Pt has undergone significant weight loiss.   Clinical Decision Making Multiple treatment options, significant modification of task necessary    Comorbidities Affecting Occupational Performance: Presence of comorbidities impacting occupational performance    Modification or Assistance to Complete Evaluation  Max significant modification of tasks or assist is necessary to complete    OT Frequency Other (comment)   6 visits for family edu for multilayer gradient compression wraps, and to fit with bilateral, knee length, adjustable, compression alternative to limit lymphorrhea while devoting majority of time and energy t significant weight loss before commencing CDT   OT Duration 6 weeks    OT Treatment/Interventions Self-care/ADL training;Therapeutic exercise;DME and/or AE instruction;Compression bandaging    Recommended Other Services Postpone lymphedema care until significant weight loss is achieved for better than poor prognosis             Patient will benefit from skilled therapeutic intervention in order to improve the following deficits and impairments:   Body Structure / Function / Physical Skills:  ADL, Edema, Skin integrity, Mobility, Decreased knowledge of precautions, Decreased knowledge of use of DME, IADL       Visit Diagnosis: Lymphedema, not elsewhere classified    Problem List Patient Active Problem List   Diagnosis Date Noted   Fall (on)(from) sidewalk curb, initial encounter 02/24/2020   Dizziness 02/13/2020   Chest wall pain 09/25/2019   Acute cystitis without hematuria 04/02/2019   Shortness of breath 04/02/2019   Otitis media 10/06/2018   Dry eyes, bilateral 10/06/2018   Otitis, externa, infective 06/12/2018   Acute right-sided low back pain without sciatica 09/03/2017   Asthma, chronic, mild persistent, uncomplicated 09/03/2017   Lymphedema 04/29/2017   Candidiasis, intertrigo 12/05/2016   Acute medial meniscus tear of right knee 06/15/2016   Chronic venous insufficiency 06/27/2015   Vitamin D deficiency 05/11/2014   Encounter for chronic pain management 05/11/2014   Intertrigo 06/30/2013   Degenerative joint disease of knees 05/01/2010   Cervical radiculopathy 08/06/2007   Migraine 11/19/2006   Essential hypertension 05/22/2006   HYPERCHOLESTEROLEMIA 05/08/2006   Morbid obesity (HCC) 05/08/2006   Allergic rhinitis 05/08/2006   GASTROESOPHAGEAL REFLUX, NO ESOPHAGITIS 05/08/2006   Loel Dubonnet, MS, OTR/L, CLT-LANA 08/31/20 12:53 PM   [place] [facility] MAIN [facility] SERVICES [address] [place], [place], [address] Phone: [phone]   Fax:  [phone]  Name: BRANIYAH BESSE MRN: [id number] Date of Birth: 1965/09/05

## 2020-08-31 NOTE — Telephone Encounter (Signed)
Patient returns call to nurse line. Patient reports that per dermatologist, she is to be using the triamcinolone cream on active eruptions and hydrocortisone cream on other areas.  Patient reports that legs are itching to the point of "drawing blood"  Patient states that she is unable to come in for appointment at this time.   Veronda Prude, RN

## 2020-09-01 ENCOUNTER — Encounter: Payer: BC Managed Care – PPO | Admitting: Occupational Therapy

## 2020-09-04 ENCOUNTER — Encounter: Payer: Self-pay | Admitting: Family Medicine

## 2020-09-04 ENCOUNTER — Ambulatory Visit: Payer: BC Managed Care – PPO | Admitting: Occupational Therapy

## 2020-09-07 ENCOUNTER — Other Ambulatory Visit: Payer: Self-pay | Admitting: Family Medicine

## 2020-09-07 ENCOUNTER — Ambulatory Visit: Payer: BC Managed Care – PPO | Admitting: Occupational Therapy

## 2020-09-07 MED ORDER — HYDROXYZINE HCL 10 MG PO TABS: 10.0000 mg | ORAL_TABLET | Freq: Three times a day (TID) | ORAL | 0 refills | Status: AC | PRN

## 2020-09-07 MED ORDER — DESOXIMETASONE 0.25 % EX LIQD: CUTANEOUS | 1 refills | Status: DC

## 2020-09-07 NOTE — Progress Notes (Signed)
Spoke with patient regarding leg edema and itching.  She has an appointment scheduled with me on July 13.  We have decided to discontinue the Triamcinolone and start Desoximetasome spray BID to see if this will help relieve the itching in her legs.  Explained that this was a higher potency steroid and to use sparingly as well it is not for long term use.  Also added Atarax 10 mg at night for itch relief.  She reports Lymphedema wraps have somewhat helped when able to apply but due to increase in swelling and itching she has had to remove them.  Discussed referral to Vein and Vascular.  She will think about this and we will revisit at next visit.  [name], MD Family Medicine Residency

## 2020-09-08 ENCOUNTER — Encounter: Payer: BC Managed Care – PPO | Admitting: Occupational Therapy

## 2020-09-12 ENCOUNTER — Other Ambulatory Visit: Payer: Self-pay | Admitting: Family Medicine

## 2020-09-12 ENCOUNTER — Ambulatory Visit: Payer: BC Managed Care – PPO | Admitting: Occupational Therapy

## 2020-09-12 DIAGNOSIS — M170 Bilateral primary osteoarthritis of knee: Secondary | ICD-10-CM

## 2020-09-12 DIAGNOSIS — G8929 Other chronic pain: Secondary | ICD-10-CM

## 2020-09-13 MED ORDER — HYDROCODONE-ACETAMINOPHEN 5-325 MG PO TABS: 1.0000 | ORAL_TABLET | Freq: Three times a day (TID) | ORAL | 0 refills | Status: DC | PRN

## 2020-09-15 ENCOUNTER — Ambulatory Visit: Payer: BC Managed Care – PPO | Admitting: Occupational Therapy

## 2020-09-19 ENCOUNTER — Encounter: Payer: Self-pay | Admitting: Family Medicine

## 2020-09-19 NOTE — Telephone Encounter (Signed)
Her appointment can be scheduled as a  video visit.  Please let patient know.  Thanks  [name], MD Family Medicine Residency

## 2020-09-19 NOTE — Patient Instructions (Signed)
Thank you for coming to see me today. It was a pleasure.   Will send a referral to Vein and Vascular surgery for evaluation.  You are due for a colonoscopy.  Please use the form that we have given you to schedule this at your convenience.    I have placed an order for your mammogram.  Please call [facility] at [phone] to schedule your appointment within one week.   You are due for a PAP smear this year.  Please make an appointment soon to schedule this with me.    I recommend getting the Shingles vaccine.  This can be received at your pharmacy.  Please follow-up with PCP as needed  If you have any questions or concerns, please do not hesitate to call the office at [phone].  Best,   [name], MD

## 2020-09-20 ENCOUNTER — Telehealth (INDEPENDENT_AMBULATORY_CARE_PROVIDER_SITE_OTHER): Payer: BC Managed Care – PPO | Admitting: Family Medicine

## 2020-09-20 ENCOUNTER — Other Ambulatory Visit: Payer: Self-pay

## 2020-09-20 DIAGNOSIS — E7800 Pure hypercholesterolemia, unspecified: Secondary | ICD-10-CM

## 2020-09-20 DIAGNOSIS — R7309 Other abnormal glucose: Secondary | ICD-10-CM | POA: Diagnosis not present

## 2020-09-20 DIAGNOSIS — G43109 Migraine with aura, not intractable, without status migrainosus: Secondary | ICD-10-CM | POA: Diagnosis not present

## 2020-09-20 DIAGNOSIS — I872 Venous insufficiency (chronic) (peripheral): Secondary | ICD-10-CM

## 2020-09-20 NOTE — Progress Notes (Addendum)
[facility] Telemedicine Visit  Patient consented to have virtual visit and was identified by name and date of birth. Method of visit: Video  Encounter participants: Patient: Catherine Powell - located at home Provider: [name] - located at office Others (if applicable): CMA April  Chief Complaint: Follow up leg swelling  HPI:  Chronic leg swelling Reports having had significant improvement in itching since starting on Desoximetasone. Has been using sparingly and is now able to wrap legs with lymphedema wraps.  Reports that swelling slightly decreased with wraps and weeping has stopped.  Denies any fevers, redness or shortness of breath.  Has not bees back to Lymphedema clinic as reports she needs to loose weight to continue to improve.    Elevated BMI Interested in Bariatric surgery.  Has been thinking about this more often lately.  Was not able to make appointments at [facility] Weight Loss clinic.  Unsure if insurance will cover.  ROS: per HPI  Pertinent PMHx:  Obesity class 3 Chronic Venous Insufficiency   Exam:  LMP 05/15/2012   Respiratory: No IWOB, speaking in full sentences during video visit  Assessment/Plan:  Chronic venous insufficiency Failed treatment with Hydrocortisone, Triamcinolone. Reports improvement in itching and weeping of lower extremities since switching medications.  Able to tolerate lymphedema wraps for 23 hrs now.   -Continue Desoximetasone  -Continue lymphedema wraps as ordered -Continue to encourage weight loss  -Follow up as needed  Migraine Migraine yesterday.  Has not been able to see neurologist and had to cancel as she has been having issues with her legs. -Follow up with Neurology -Consider initiation of CGRP-RA -Follow up as needed  Obesity, Class III, BMI 40-49.9 (morbid obesity) (HCC) Has not weighed self in a while.  Appears that 433lbs on file may be correct.  Doesn't feel that she has lost or gained.   Was nat able to tolerated Ozempic due to GI side effects.  Has not been able to make to Health and Weight loss appointment.  Now interested in Bariatric Surgery. -Refer to [facility] for evaluation -Continue to encourage healthy choices  Health Maintenance Colonoscopy: declined today; continue to address at each visit.  Offered Cologuard-patient will let me know PAP 2017, NILM, HPV neg: due 2022; patient aware to make appointment Mammogram 2016, wnl; declined today; continue to address at each visit Hep C screening: completed 2017 HIV screening: completed 2017 COVID Vaccine: received 2 vaccines Shingle Vaccine: encouraged patient to get at pharmacy A1c future order placed Lipid panel future order placed     Time spent during visit with patient: 15 minutes

## 2020-09-21 ENCOUNTER — Encounter: Payer: Self-pay | Admitting: Family Medicine

## 2020-09-21 NOTE — Assessment & Plan Note (Signed)
Has not weighed self in a while.  Appears that 433lbs on file may be correct.  Doesn't feel that she has lost or gained.  Was nat able to tolerated Ozempic due to GI side effects.  Has not been able to make to Health and Weight loss appointment.  Now interested in Bariatric Surgery. -Refer to [facility] for evaluation -Continue to encourage healthy choices

## 2020-09-21 NOTE — Assessment & Plan Note (Signed)
Migraine yesterday.  Has not been able to see neurologist and had to cancel as she has been having issues with her legs. -Follow up with Neurology -Consider initiation of CGRP-RA -Follow up as needed

## 2020-09-21 NOTE — Assessment & Plan Note (Signed)
Failed treatment with Hydrocortisone, Triamcinolone. Reports improvement in itching and weeping of lower extremities since switching medications.  Able to tolerate lymphedema wraps for 23 hrs now.   -Continue Desoximetasone  -Continue lymphedema wraps as ordered -Continue to encourage weight loss  -Follow up as needed

## 2020-09-26 ENCOUNTER — Telehealth: Payer: Self-pay

## 2020-09-26 ENCOUNTER — Other Ambulatory Visit: Payer: Self-pay | Admitting: Family Medicine

## 2020-09-26 NOTE — Telephone Encounter (Signed)
Received fax from pharmacy requesting PA on Desoximetasone.   Insurance preferred medication is FLUOCINONIDE TOPICAL SOLUTION 0.05%.   Please advise if this alternative is acceptable. If not, please provide rationale as to why this medication would not work for the patient and we can proceed with PA.   Veronda Prude, RN

## 2020-09-29 ENCOUNTER — Other Ambulatory Visit: Payer: Self-pay | Admitting: Family Medicine

## 2020-09-29 ENCOUNTER — Encounter: Payer: Self-pay | Admitting: Family Medicine

## 2020-09-29 MED ORDER — FLUOCINONIDE 0.05 % EX SOLN: 1.0000 "application " | Freq: Two times a day (BID) | CUTANEOUS | 1 refills | Status: DC

## 2020-09-29 NOTE — Telephone Encounter (Signed)
Catherine Powell,  This medication is the same as the one I prescribed and I don't have any problems ordering it.  Would you mind calling patient to see if she wants to switch?  I tried to call but no answer and LVM for her to return call.   Thanks

## 2020-09-29 NOTE — Progress Notes (Signed)
Prescription sent for Fluocinonide Topical solution 0.055 BID. Patient is aware of change in medication and agreeable.  [name], MD Family Medicine Residency

## 2020-10-14 ENCOUNTER — Other Ambulatory Visit: Payer: Self-pay | Admitting: Family Medicine

## 2020-10-14 DIAGNOSIS — M170 Bilateral primary osteoarthritis of knee: Secondary | ICD-10-CM

## 2020-10-14 DIAGNOSIS — G8929 Other chronic pain: Secondary | ICD-10-CM

## 2020-10-18 ENCOUNTER — Encounter: Payer: Self-pay | Admitting: Family Medicine

## 2020-10-19 MED ORDER — AQUAPHOR EX OINT: TOPICAL_OINTMENT | CUTANEOUS | 0 refills | Status: DC | PRN

## 2020-10-19 MED ORDER — NYSTATIN 100000 UNIT/GM EX CREA: TOPICAL_CREAM | Freq: Two times a day (BID) | CUTANEOUS | 0 refills | Status: DC

## 2020-10-19 MED ORDER — HYDROCODONE-ACETAMINOPHEN 5-325 MG PO TABS: 1.0000 | ORAL_TABLET | Freq: Three times a day (TID) | ORAL | 0 refills | Status: DC | PRN

## 2020-11-09 ENCOUNTER — Other Ambulatory Visit: Payer: Self-pay

## 2020-11-09 ENCOUNTER — Ambulatory Visit (INDEPENDENT_AMBULATORY_CARE_PROVIDER_SITE_OTHER): Payer: BC Managed Care – PPO | Admitting: Family Medicine

## 2020-11-09 ENCOUNTER — Encounter: Payer: Self-pay | Admitting: Family Medicine

## 2020-11-09 VITALS — BP 146/64 | HR 82 | Ht 69.0 in | Wt >= 6400 oz

## 2020-11-09 DIAGNOSIS — B351 Tinea unguium: Secondary | ICD-10-CM

## 2020-11-09 DIAGNOSIS — Z1231 Encounter for screening mammogram for malignant neoplasm of breast: Secondary | ICD-10-CM

## 2020-11-09 DIAGNOSIS — E7800 Pure hypercholesterolemia, unspecified: Secondary | ICD-10-CM | POA: Diagnosis not present

## 2020-11-09 DIAGNOSIS — M170 Bilateral primary osteoarthritis of knee: Secondary | ICD-10-CM

## 2020-11-09 DIAGNOSIS — G43109 Migraine with aura, not intractable, without status migrainosus: Secondary | ICD-10-CM

## 2020-11-09 DIAGNOSIS — R7309 Other abnormal glucose: Secondary | ICD-10-CM | POA: Diagnosis not present

## 2020-11-09 DIAGNOSIS — I10 Essential (primary) hypertension: Secondary | ICD-10-CM

## 2020-11-09 DIAGNOSIS — G8929 Other chronic pain: Secondary | ICD-10-CM

## 2020-11-09 DIAGNOSIS — I872 Venous insufficiency (chronic) (peripheral): Secondary | ICD-10-CM

## 2020-11-09 LAB — POCT GLYCOSYLATED HEMOGLOBIN (HGB A1C): Hemoglobin A1C: 5.5 % (ref 4.0–5.6)

## 2020-11-09 MED ORDER — PROPRANOLOL HCL 40 MG PO TABS: 80.0000 mg | ORAL_TABLET | Freq: Two times a day (BID) | ORAL | 3 refills | Status: DC

## 2020-11-09 MED ORDER — TERBINAFINE HCL 1 % EX CREA: 1.0000 "application " | TOPICAL_CREAM | Freq: Two times a day (BID) | CUTANEOUS | 0 refills | Status: DC

## 2020-11-09 MED ORDER — QUINAPRIL HCL 20 MG PO TABS: 20.0000 mg | ORAL_TABLET | Freq: Every day | ORAL | 3 refills | Status: DC

## 2020-11-09 MED ORDER — ATORVASTATIN CALCIUM 40 MG PO TABS: 40.0000 mg | ORAL_TABLET | Freq: Every evening | ORAL | 3 refills | Status: DC

## 2020-11-09 NOTE — Patient Instructions (Signed)
Thank you for coming to see me today. It was a pleasure.    We will get some labs today.  If they are abnormal or we need to do something about them, I will call you.  If they are normal, I will send you a message on MyChart (if it is active) or a letter in the mail.  If you don't hear from [place] in 2 weeks, please call the office at the number below.   I have placed an order for your mammogram.  Please call [facility] at [phone] to schedule your appointment within one week.   Please follow-up with PCP for PAP in Sept  If you have any questions or concerns, please do not hesitate to call the office at [phone].  Best,   [name], MD

## 2020-11-09 NOTE — Progress Notes (Signed)
    SUBJECTIVE:   CHIEF COMPLAINT / HPI: lab work, FMLA form, toenail pain  Elevated BMI Patient reports husband was recently admitted for CAD and now has pacemaker.  She is now wanting to be more active in weight management.  Reports [facility] and is looking forward to this.  Has had call from Bariatric surgery but has not yet returned call for appointment.    Onychomycosis Right great toe has been painful.  Thickened yellow nail and would like removal.  Has been ongoing for few months.  Tried OTC fungal spray without relief.   PERTINENT  PMH / PSH:  Chronic Lymphedema Obesity class 3  OBJECTIVE:   BP (!) 146/64   Pulse 82   Ht 5\' 9"  (1.753 m)   Wt (!) 432 lb 12.8 oz (196.3 kg)   LMP 05/15/2012   SpO2 96%   BMI 63.91 kg/m    General: Alert, no acute distress Cardio: Normal S1 and S2, RRR, no r/m/g Pulm: CTAB, normal work of breathing Extremities: Chronic bilateral lower extremity edema. Pulses present bilaterally Right great hallux: discolored, thickened nail, no drainage or erythema.   ASSESSMENT/PLAN:   Obesity, Class III, BMI 40-49.9 (morbid obesity) (HCC) Congratulated on self awareness to healthy lifestyle -Encouraged to keep scheduled appointment at 07/15/2012 [facility] -Encouraged to follow up with Bariatrics, with history of intertrigo I think surgery would be covered by insurance but advised to speak with Bariatric provider and insurance  -Follow up as needed  Onychomycosis Right great toe nail trimmed and cultures sent -Terbinafine cream BID -CMP today -If cultures positive and cream not effective will switch to systemic treatment  -Follow up with results  Essential hypertension Slightly elevated today Monitor at home If continues to elevate will need to adjust medications Follow up in 1 month  HYPERCHOLESTEROLEMIA Lipid panel today  Chronic venous insufficiency Continue appointments at  Lymphedema clinic Continue current treatment Follow up as needed  Migraine Encouraged to follow up with Neurology for migraines.  I think she may be a candidate for Nurtec She will let me know if needing new referral Follow up as needed    [employer], MD [facility] Medicine [facility]

## 2020-11-10 LAB — COMPREHENSIVE METABOLIC PANEL
ALT: 35 IU/L — ABNORMAL HIGH (ref 0–32)
AST: 29 IU/L (ref 0–40)
Albumin/Globulin Ratio: 1.1 — ABNORMAL LOW (ref 1.2–2.2)
Albumin: 3.9 g/dL (ref 3.8–4.9)
Alkaline Phosphatase: 77 IU/L (ref 44–121)
BUN/Creatinine Ratio: 10 (ref 9–23)
BUN: 7 mg/dL (ref 6–24)
Bilirubin Total: 0.4 mg/dL (ref 0.0–1.2)
CO2: 25 mmol/L (ref 20–29)
Calcium: 9.2 mg/dL (ref 8.7–10.2)
Chloride: 100 mmol/L (ref 96–106)
Creatinine, Ser: 0.72 mg/dL (ref 0.57–1.00)
Globulin, Total: 3.4 g/dL (ref 1.5–4.5)
Glucose: 99 mg/dL (ref 65–99)
Potassium: 4.8 mmol/L (ref 3.5–5.2)
Sodium: 140 mmol/L (ref 134–144)
Total Protein: 7.3 g/dL (ref 6.0–8.5)
eGFR: 98 mL/min/{1.73_m2} (ref >59)

## 2020-11-10 LAB — LIPID PANEL
Chol/HDL Ratio: 5.2 ratio — ABNORMAL HIGH (ref 0.0–4.4)
Cholesterol, Total: 219 mg/dL — ABNORMAL HIGH (ref 100–199)
HDL: 42 mg/dL (ref >39)
LDL Chol Calc (NIH): 147 mg/dL — ABNORMAL HIGH (ref 0–99)
Triglycerides: 166 mg/dL — ABNORMAL HIGH (ref 0–149)
VLDL Cholesterol Cal: 30 mg/dL (ref 5–40)

## 2020-11-12 ENCOUNTER — Encounter: Payer: Self-pay | Admitting: Family Medicine

## 2020-11-12 DIAGNOSIS — B351 Tinea unguium: Secondary | ICD-10-CM | POA: Insufficient documentation

## 2020-11-12 NOTE — Assessment & Plan Note (Signed)
Continue appointments at Lymphedema clinic Continue current treatment Follow up as needed

## 2020-11-12 NOTE — Assessment & Plan Note (Signed)
Right great toe nail trimmed and cultures sent -Terbinafine cream BID -CMP today -If cultures positive and cream not effective will switch to systemic treatment  -Follow up with results

## 2020-11-12 NOTE — Assessment & Plan Note (Signed)
Encouraged to follow up with Neurology for migraines.  I think she may be a candidate for Nurtec She will let me know if needing new referral Follow up as needed

## 2020-11-12 NOTE — Assessment & Plan Note (Signed)
Slightly elevated today Monitor at home If continues to elevate will need to adjust medications Follow up in 1 month

## 2020-11-12 NOTE — Assessment & Plan Note (Signed)
Congratulated on self awareness to healthy lifestyle -Encouraged to keep scheduled appointment at [employer] and Wellness -Encouraged to follow up with Bariatrics, with history of intertrigo I think surgery would be covered by insurance but advised to speak with Bariatric provider and insurance  -Follow up as needed

## 2020-11-12 NOTE — Assessment & Plan Note (Signed)
Lipid panel today

## 2020-11-14 ENCOUNTER — Telehealth: Payer: Self-pay | Admitting: Family Medicine

## 2020-11-14 NOTE — Telephone Encounter (Signed)
Please fax FMLA forms and let patient know when ready for pick up.  There is also a copy in Media tab.    Thank you [name], MD Family Medicine Residency

## 2020-11-15 ENCOUNTER — Encounter (INDEPENDENT_AMBULATORY_CARE_PROVIDER_SITE_OTHER): Payer: Self-pay

## 2020-11-16 ENCOUNTER — Ambulatory Visit (INDEPENDENT_AMBULATORY_CARE_PROVIDER_SITE_OTHER): Payer: Self-pay | Admitting: Family Medicine

## 2020-11-17 ENCOUNTER — Other Ambulatory Visit: Payer: Self-pay | Admitting: Family Medicine

## 2020-11-17 DIAGNOSIS — G8929 Other chronic pain: Secondary | ICD-10-CM

## 2020-11-17 DIAGNOSIS — M170 Bilateral primary osteoarthritis of knee: Secondary | ICD-10-CM

## 2020-11-17 MED ORDER — HYDROCODONE-ACETAMINOPHEN 5-325 MG PO TABS: 1.0000 | ORAL_TABLET | Freq: Three times a day (TID) | ORAL | 0 refills | Status: DC | PRN

## 2020-11-20 ENCOUNTER — Other Ambulatory Visit: Payer: Self-pay | Admitting: Family Medicine

## 2020-11-20 ENCOUNTER — Encounter: Payer: Self-pay | Admitting: Family Medicine

## 2020-11-20 DIAGNOSIS — G8929 Other chronic pain: Secondary | ICD-10-CM

## 2020-11-20 DIAGNOSIS — M170 Bilateral primary osteoarthritis of knee: Secondary | ICD-10-CM

## 2020-11-20 NOTE — Telephone Encounter (Signed)
Patient calls nurse line stating the [facility] on [address] is closed indefinitely. Patent needs Hydrocodone resent to [facility] on [address].   I did verify the pharmacy is closed.

## 2020-11-20 NOTE — Addendum Note (Signed)
Addended by: Steva Colder on: 11/20/2020 02:44 PM   Modules accepted: Orders

## 2020-11-21 ENCOUNTER — Telehealth: Payer: Self-pay | Admitting: Family Medicine

## 2020-11-21 ENCOUNTER — Other Ambulatory Visit: Payer: Self-pay | Admitting: Family Medicine

## 2020-11-21 DIAGNOSIS — K219 Gastro-esophageal reflux disease without esophagitis: Secondary | ICD-10-CM

## 2020-11-21 MED ORDER — HYDROCODONE-ACETAMINOPHEN 5-325 MG PO TABS
1.0000 | ORAL_TABLET | Freq: Three times a day (TID) | ORAL | 0 refills | Status: DC | PRN
Start: 2020-11-21 — End: 2020-11-21

## 2020-11-21 MED ORDER — HYDROCODONE-ACETAMINOPHEN 5-325 MG PO TABS
1.0000 | ORAL_TABLET | Freq: Three times a day (TID) | ORAL | 0 refills | Status: AC | PRN
Start: 2020-11-21 — End: 2020-12-21

## 2020-11-21 NOTE — Telephone Encounter (Signed)
**  After Hours/ Emergency Line Call**  Received a call to report that Catherine Powell was requesting a refill on her Norco pain medication. She states it was called into a pharmacy that is closed long term and needs it called to a different pharmacy - [facility] on [address].  I explained that per clinic rules I would not be able to send it via the after-hours emergency line but would forward a message to her PCP. Patient was understanding and appreciative of this. Will forward to PCP.  Jackelyn Poling, DO PGY-3, [facility] 11/21/2020 12:47 AM

## 2020-11-21 NOTE — Addendum Note (Signed)
Addended byManson Passey, Duriel Deery on: 11/21/2020 02:07 PM   Modules accepted: Orders

## 2020-11-21 NOTE — Telephone Encounter (Signed)
PDMP reviewed. Refill sent in.

## 2020-11-21 NOTE — Telephone Encounter (Signed)
Patient is unable to pick up paper copy of rx and PCP is unable to send electronically at this time.   Forwarding to preceptor.   Veronda Prude, RN

## 2020-11-30 ENCOUNTER — Ambulatory Visit (INDEPENDENT_AMBULATORY_CARE_PROVIDER_SITE_OTHER): Payer: Self-pay | Admitting: Family Medicine

## 2020-12-04 ENCOUNTER — Telehealth: Payer: Self-pay

## 2020-12-04 NOTE — Telephone Encounter (Signed)
Patient calls nurse line requesting a letter for work for this past Saturday 9/24. Patient reports she had a migraine all day and missed a full 8 hours. Patient reports the FMLA only covers a 4 hours. Please advise on letter and patient will access via mychart.

## 2020-12-05 ENCOUNTER — Encounter: Payer: Self-pay | Admitting: Family Medicine

## 2020-12-06 NOTE — Telephone Encounter (Signed)
Letter written. Patient should be able to access on mychart.

## 2020-12-06 NOTE — Telephone Encounter (Signed)
Ok to give letter for 9/24

## 2020-12-14 ENCOUNTER — Other Ambulatory Visit: Payer: Self-pay | Admitting: Family Medicine

## 2020-12-14 DIAGNOSIS — J4530 Mild persistent asthma, uncomplicated: Secondary | ICD-10-CM

## 2020-12-15 ENCOUNTER — Other Ambulatory Visit: Payer: Self-pay

## 2020-12-15 DIAGNOSIS — G43109 Migraine with aura, not intractable, without status migrainosus: Secondary | ICD-10-CM

## 2020-12-15 DIAGNOSIS — J4530 Mild persistent asthma, uncomplicated: Secondary | ICD-10-CM

## 2020-12-15 MED ORDER — FUROSEMIDE 20 MG PO TABS: 20.0000 mg | ORAL_TABLET | Freq: Every day | ORAL | 0 refills | Status: DC | PRN

## 2020-12-15 MED ORDER — TOPIRAMATE 100 MG PO TABS: 100.0000 mg | ORAL_TABLET | Freq: Two times a day (BID) | ORAL | 3 refills | Status: DC

## 2020-12-15 MED ORDER — SUMATRIPTAN SUCCINATE 50 MG PO TABS: ORAL_TABLET | ORAL | 3 refills | Status: DC

## 2020-12-15 MED ORDER — ALBUTEROL SULFATE HFA 108 (90 BASE) MCG/ACT IN AERS: 2.0000 | INHALATION_SPRAY | RESPIRATORY_TRACT | 3 refills | Status: DC | PRN

## 2020-12-18 LAB — FUNGUS CULTURE, BLOOD

## 2020-12-20 ENCOUNTER — Telehealth: Payer: Self-pay | Admitting: *Deleted

## 2020-12-20 NOTE — Telephone Encounter (Signed)
Fax received for refill of Torsemide tabs 20 MG 90 day supply wit 3 refills but did not see on current med list.  Please send in if appropriate.Tala Eber Zimmerman Rumple, CMA

## 2020-12-26 ENCOUNTER — Encounter: Payer: Self-pay | Admitting: Family Medicine

## 2020-12-27 NOTE — Telephone Encounter (Signed)
Please have attending sign order.  It's appropriate.  My Impravata isn't working.  Thanks  Catherine Allan, MD Family Medicine Residency

## 2020-12-29 ENCOUNTER — Telehealth: Payer: Self-pay | Admitting: Family Medicine

## 2020-12-29 MED ORDER — HYDROCODONE-ACETAMINOPHEN 5-325 MG PO TABS: 1.0000 | ORAL_TABLET | Freq: Four times a day (QID) | ORAL | 0 refills | Status: DC | PRN

## 2020-12-29 NOTE — Telephone Encounter (Signed)
This has been handled in a separate encounter. PCP is unable to fill narcotics at this time. Medication refill has been forwarded to afternoon preceptor.

## 2020-12-29 NOTE — Telephone Encounter (Signed)
Pt stated she needs her pain medication today because she is in pain. Want to attend sister's funeral tomorrow .

## 2021-01-01 ENCOUNTER — Telehealth: Payer: Self-pay

## 2021-01-01 NOTE — Telephone Encounter (Signed)
Received refill request from [facility] for: Torsemide 20 MG tabs 90 day supply with 3 refills Did not see on med list.   Sunday Spillers, CMA

## 2021-01-04 ENCOUNTER — Encounter: Payer: Self-pay | Admitting: Family Medicine

## 2021-01-08 ENCOUNTER — Ambulatory Visit: Payer: BC Managed Care – PPO | Admitting: Family Medicine

## 2021-01-20 ENCOUNTER — Encounter: Payer: Self-pay | Admitting: Family Medicine

## 2021-01-21 NOTE — Progress Notes (Deleted)
    SUBJECTIVE:   CHIEF COMPLAINT / HPI: lymphedema   Patient reports ***   PERTINENT  PMH / PSH: ***  OBJECTIVE:   LMP 05/15/2012   ***  ASSESSMENT/PLAN:   No problem-specific Assessment & Plan notes found for this encounter.     Ronnald Ramp, MD [employer] [facility]

## 2021-01-22 ENCOUNTER — Other Ambulatory Visit: Payer: Self-pay

## 2021-01-22 ENCOUNTER — Ambulatory Visit (INDEPENDENT_AMBULATORY_CARE_PROVIDER_SITE_OTHER): Payer: BC Managed Care – PPO | Admitting: Family Medicine

## 2021-01-22 ENCOUNTER — Encounter: Payer: Self-pay | Admitting: Family Medicine

## 2021-01-22 VITALS — BP 148/75 | HR 75 | Ht 68.0 in | Wt >= 6400 oz

## 2021-01-22 DIAGNOSIS — L03115 Cellulitis of right lower limb: Secondary | ICD-10-CM

## 2021-01-22 HISTORY — DX: Cellulitis of right lower limb: L03.115

## 2021-01-22 MED ORDER — CEPHALEXIN 500 MG PO CAPS: 500.0000 mg | ORAL_CAPSULE | Freq: Four times a day (QID) | ORAL | 0 refills | Status: AC

## 2021-01-22 MED ORDER — HYDROCODONE-ACETAMINOPHEN 5-325 MG PO TABS: 1.0000 | ORAL_TABLET | Freq: Four times a day (QID) | ORAL | 0 refills | Status: DC | PRN

## 2021-01-22 NOTE — Assessment & Plan Note (Signed)
-   start cephalexin 500mg  QID x 7 days, note given for work, return precautions discussed, f/u in 1-2 weeks to reassess or sooner if worsening

## 2021-01-22 NOTE — Telephone Encounter (Signed)
Patient needs to be evaluated by provider.  Recommend she be seen in Urgent care if cannot get in today in clinic.  Thank you

## 2021-01-22 NOTE — Progress Notes (Signed)
    SUBJECTIVE:   CHIEF COMPLAINT / HPI:   Blistering/redness- she notes about a week and a half ago, noted some spots on her posterior right calf, didn't see the blisters just saw spots and spreading redness. Then she wore wet socks at work and it rubbed blisters on her right ankle and now that has been getting red as well. No fevers or chills, nausea or vomiting. Was worried it was getting infected. No pus dripping. No history of MRSA infections, has taken cephalexin before. Lymphedema wrapped by her son after following with clinic in [place], but cannot drive or go to work with compression stockings on. Needs note for work.   PERTINENT  PMH / PSH: as above  OBJECTIVE:   BP (!) 148/75   Pulse 75   Ht 5\' 8"  (1.727 m)   Wt (!) 430 lb (195 kg)   LMP 05/15/2012   SpO2 100%   BMI 65.38 kg/m   General: alert & oriented, no apparent distress, well groomed HEENT: normocephalic, atraumatic, EOM grossly intact, oral mucosa moist, neck supple Respiratory: normal respiratory effort GI: non-distended Skin: two circular lesions with granulation tissue, 0.5 cm and round, with surrounding erythema on right posterior calf, deroofed blisters without surrounding erythema on right lateral ankle, no weeping Psych: appropriate mood and affect   ASSESSMENT/PLAN:   Cellulitis of right lower extremity - start cephalexin 500mg  QID x 7 days, note given for work, return precautions discussed, f/u in 1-2 weeks to reassess or sooner if worsening     07/15/2012, MD [facility]

## 2021-01-22 NOTE — Patient Instructions (Signed)
It was wonderful to see you today.  Please bring ALL of your medications with you to every visit.   Today we talked about:  - We will start cephalexin 500mg  four times a day for seven days - I have written you a one week note to be out of work - Please make a follow up appt in 1-2 weeks   Thank you for choosing [facility].   Please call [phone] with any questions about today's appointment.  Please be sure to schedule follow up at the front  desk before you leave today.   [phone], MD  Family Medicine

## 2021-01-31 ENCOUNTER — Ambulatory Visit (INDEPENDENT_AMBULATORY_CARE_PROVIDER_SITE_OTHER): Payer: BC Managed Care – PPO | Admitting: Family Medicine

## 2021-01-31 ENCOUNTER — Other Ambulatory Visit: Payer: Self-pay

## 2021-01-31 ENCOUNTER — Encounter: Payer: Self-pay | Admitting: Family Medicine

## 2021-01-31 ENCOUNTER — Ambulatory Visit (INDEPENDENT_AMBULATORY_CARE_PROVIDER_SITE_OTHER): Payer: BC Managed Care – PPO

## 2021-01-31 VITALS — BP 138/75 | HR 79 | Ht 68.0 in | Wt >= 6400 oz

## 2021-01-31 DIAGNOSIS — L03115 Cellulitis of right lower limb: Secondary | ICD-10-CM | POA: Diagnosis not present

## 2021-01-31 DIAGNOSIS — Z23 Encounter for immunization: Secondary | ICD-10-CM

## 2021-01-31 NOTE — Progress Notes (Signed)
    SUBJECTIVE:   CHIEF COMPLAINT / HPI: follow up cellulitis  Presents for follow up for Left lower extremity cellulitis. Seen in clinic on 11/14 and treated with Keflex.  Since then patient reports slight improvement in symptoms. Still having some weeping from lower extremity.  Denies any fevers or purulent discharge.  Completed course of antibiotic treatment.  Has not been able to return to work since.    PERTINENT  PMH / PSH:  Chronic lower extremity lymphedema Obesity class 3 Migraine without aura Recurrent Cellulitis of lower extremities   OBJECTIVE:   BP 138/75   Pulse 79   Ht 5\' 8"  (1.727 m)   Wt (!) 431 lb 3.2 oz (195.6 kg)   LMP 05/15/2012   SpO2 100%   BMI 65.56 kg/m    General: Alert, no acute distress Extremities: bilateral lower extremity edema at baseline.    ASSESSMENT/PLAN:   Cellulitis of right lower extremity Improving and without purulent drainage;  Still having some oozing from lower extremity likely from chronic lymphedema -WOCN referral -Note for off work until then 07/15/2012 forms to be completed -Follow up in 2 weeks     [employer], MD [facility]

## 2021-01-31 NOTE — Patient Instructions (Addendum)
Thank you for coming to see me today. It was a pleasure.   PAP is overdue.  Please schedule an appointment for this at your earliest convenience  You are due for a colonoscopy.   I have placed an order for your mammogram.  Please call [facility] at [phone] to schedule your appointment within one week.   Follow up with Bariatric surgery for appointment if not already scheduled Address: [address], [place], [place] [address] Phone: [phone]  Referral to Wound care for your legs  Please follow-up with PCP as needed  If you have any questions or concerns, please do not hesitate to call the office at [phone].  Best,   [name], MD     Lymphedema Lymphedema is swelling that is caused by the abnormal collection of lymph in the tissues under the skin. Lymph is excess fluid from the tissues in your body that is removed through the lymphatic system. This system is part of your body's defense system (immune system) and includes lymph nodes and lymph vessels. The lymph vessels collect and carry the excess fluid, fats, proteins, and waste from the tissues of the body to the bloodstream. This system also works to clean and remove bacteria and waste products from the body. Lymphedema occurs when the lymphatic system is blocked. When the lymph vessels or lymph nodes are blocked or damaged, lymph does not drain properly. This causes an abnormal buildup of lymph, which leads to swelling in the affected area. This may include the trunk area, or an arm or leg. Lymphedema cannot be cured by medicines, but various methods can be used to help reduce the swelling. What are the causes? The cause of this condition depends on the type of lymphedema that you have. Primary lymphedema is caused by the absence of lymph vessels or having abnormal lymph vessels at birth. Secondary lymphedema occurs when lymph vessels are blocked or damaged. Secondary lymphedema is more common.  Common causes of lymph vessel blockage include: Skin infection, such as cellulitis. Infection by parasites (filariasis). Injury. Radiation therapy. Cancer. Formation of scar tissue. Surgery. What are the signs or symptoms? Symptoms of this condition include: Swelling of the arm or leg. A heavy or tight feeling in the arm or leg. Swelling of the feet, toes, or fingers. Shoes or rings may fit more tightly than before. Redness of the skin over the affected area. Limited movement of the affected limb. Sensitivity to touch or discomfort in the affected limb. How is this diagnosed? This condition may be diagnosed based on: Your symptoms and medical history. A physical exam. Bioimpedance spectroscopy. In this test, painless electrical currents are used to measure fluid levels in your body. Imaging tests, such as: MRI. CT scan. Duplex ultrasound. This test uses sound waves to produce images of the vessels and the blood flow on a screen. Lymphoscintigraphy. In this test, a low dose of a radioactive substance is injected to trace the flow of lymph through your lymph vessels. Lymphangiography. In this test, a contrast dye is injected into the lymph vessel to help show blockages. How is this treated? If an underlying condition is causing the lymphedema, that condition will be treated. For example, antibiotic medicines may be used to treat an infection. Treatment for this condition will depend on the cause of your lymphedema. Treatment may include: Complete decongestive therapy (CDT). This is done by a certified lymphedema therapist to reduce fluid congestion. This therapy includes: Skin care. Compression wrapping of  the affected area. Manual lymph drainage. This is a special massage technique that promotes lymph drainage out of a limb. Specific exercises. Certain exercises can help fluid move out of the affected limb. Compression. Various methods may be used to apply pressure to the affected  limb to reduce the swelling. They include: Wearing compression stockings or sleeves on the affected limb. Wrapping the affected limb with special bandages. Surgery. This is usually done for severe cases only. For example, surgery may be done if you have trouble moving the limb or if the swelling does not get better with other treatments. Follow these instructions at home: Self-care The affected area is more likely to become injured or infected. Take these steps to help prevent infection: Keep the affected area clean and dry. Use approved creams or lotions to keep the skin moisturized. Protect your skin from cuts: Use gloves while cooking or gardening. Do not walk barefoot. If you shave the affected area, use an Neurosurgeon. Do not wear tight clothes, shoes, or jewelry. Eat a healthy diet that includes a lot of fruits and vegetables. Activity Do exercises as told by your health care provider. Do not sit with your legs crossed. When possible, keep the affected limb raised (elevated) above the level of your heart. Avoid carrying things with an arm that is affected by lymphedema. General instructions Wear compression stockings or sleeves as told by your health care provider. Note any changes in size of the affected limb. You may be instructed to take regular measurements and keep track of them. Take over-the-counter and prescription medicines only as told by your health care provider. If you were prescribed an antibiotic medicine, take or apply it as told by your health care provider. Do not stop using the antibiotic even if you start to feel better or if your condition improves. Do not use heating pads or ice packs on the affected area. Avoid having blood draws, IV insertions, or blood pressure checks on the affected limb. Keep all follow-up visits. This is important. Contact a health care provider if you: Continue to have swelling in your limb. Have fluid leaking from the skin of your  swollen limb. Have a cut that does not heal. Have redness or pain in the affected area. Develop purplish spots, rash, blisters, or sores (lesions) on your affected limb. Get help right away if you: Have new swelling in your limb that starts suddenly. Have shortness of breath or chest pain. Have a fever or chills. These symptoms may represent a serious problem that is an emergency. Do not wait to see if the symptoms will go away. Get medical help right away. Call your local emergency services (911 in the [place] Do not drive yourself to the hospital. Summary Lymphedema is swelling that is caused by the abnormal collection of lymph in the tissues under the skin. Lymph is fluid from the tissues in your body that is removed through the lymphatic system. This system collects and carries excess fluid, fats, proteins, and wastes from the tissues of the body to the bloodstream. Lymphedema causes swelling, pain, and redness in the affected area. This may include the trunk area, or an arm or leg. Treatment for this condition may depend on the cause of your lymphedema. Treatment may include treating the underlying cause, complete decongestive therapy (CDT), compression methods, or surgery. This information is not intended to replace advice given to you by your health care provider. Make sure you discuss any questions you have with your health  care provider. Document Revised: 12/22/2019 Document Reviewed: 12/22/2019 Elsevier Patient Education  2022 [employer].

## 2021-02-04 ENCOUNTER — Encounter: Payer: Self-pay | Admitting: Family Medicine

## 2021-02-04 NOTE — Assessment & Plan Note (Signed)
Improving and without purulent drainage;  Still having some oozing from lower extremity likely from chronic lymphedema -WOCN referral -Note for off work until then [employer] forms to be completed -Follow up in 2 weeks

## 2021-02-06 ENCOUNTER — Telehealth: Payer: Self-pay

## 2021-02-06 NOTE — Telephone Encounter (Signed)
Patient calls nurse line requesting power wheelchair. Patient states that she has difficulty ambulating due to lymphedema.   Patient was last seen in the office on 01/31/21. Advised that insurance requires face to face visit with documentation that wheelchair is necessary.   Patient declines to schedule appointment at this time.   Please advise.   Veronda Prude, RN

## 2021-02-07 ENCOUNTER — Encounter: Payer: Self-pay | Admitting: Family Medicine

## 2021-02-08 NOTE — Telephone Encounter (Signed)
I have refaxed FMLA papers per patient request. As of 12/1 [place] still had not received.

## 2021-02-09 ENCOUNTER — Other Ambulatory Visit: Payer: Self-pay | Admitting: Family Medicine

## 2021-02-09 NOTE — Telephone Encounter (Signed)
Patient needs to be evaluated by physical therapy.  I will put in the referral today.

## 2021-02-09 NOTE — Progress Notes (Signed)
Patient is requesting electric wheelchair.  She will need evaluation by physical therapy for appropriateness.    Previous interventions Have referred to bariatric surgery and weight loss clinic but has been unable to make scheduled appointments.  Unable to tolerate Saxenda.  Referred to lymphedema clinic with minimal relief.  I think next best intervention would be vascular surgery to address lymphedema.  Discussed this with patient at last visit but decided to continue with WOCN.  She has an appointment with them in Jan for venous stasis dermatitis.    Once this the above have been competed and if appropriate will discuss electric wheelchair but I believe it is in the best interest of patient to continue lifestyle management.  Discussed with Attending, Dr Manson Passey who agrees with recommendations.    [name], MD Family Medicine Residency

## 2021-02-14 ENCOUNTER — Other Ambulatory Visit: Payer: Self-pay | Admitting: Family Medicine

## 2021-02-14 ENCOUNTER — Telehealth: Payer: Self-pay | Admitting: Family Medicine

## 2021-02-14 DIAGNOSIS — L03115 Cellulitis of right lower limb: Secondary | ICD-10-CM

## 2021-02-15 MED ORDER — FUROSEMIDE 20 MG PO TABS: 20.0000 mg | ORAL_TABLET | Freq: Every day | ORAL | 0 refills | Status: DC | PRN

## 2021-02-15 MED ORDER — HYDROCODONE-ACETAMINOPHEN 5-325 MG PO TABS: 1.0000 | ORAL_TABLET | Freq: Four times a day (QID) | ORAL | 0 refills | Status: DC | PRN

## 2021-02-20 ENCOUNTER — Encounter: Payer: Self-pay | Admitting: Family Medicine

## 2021-02-21 NOTE — Telephone Encounter (Signed)
Thank you :)

## 2021-02-26 ENCOUNTER — Encounter: Payer: Self-pay | Admitting: Family Medicine

## 2021-02-26 ENCOUNTER — Telehealth (INDEPENDENT_AMBULATORY_CARE_PROVIDER_SITE_OTHER): Payer: BC Managed Care – PPO | Admitting: Family Medicine

## 2021-02-26 DIAGNOSIS — U071 COVID-19: Secondary | ICD-10-CM | POA: Diagnosis not present

## 2021-02-26 DIAGNOSIS — I890 Lymphedema, not elsewhere classified: Secondary | ICD-10-CM

## 2021-02-26 NOTE — Progress Notes (Signed)
[facility] Telemedicine Visit  Patient consented to have virtual visit and was identified by name and date of birth. Method of visit: Telephone  Encounter participants: Patient: Catherine Powell - located at home Provider: [name] - located at office Others (if applicable): None  Chief Complaint: follow up lymphedema  HPI:  Continues to have weeping from lower extremities. Reports using creams and wraps regularly with frequent changes.  Denies any fevers. Chronic edema and no increase in redness.  Has wound care appointment in January.  Waiting for neuro rehab appointment for evaluation of electric wheelchair.  Was not able to reach out to Bariatric surgery to set up appointment. Would like to proceed with vascular surgery referral.  Recent COVID infection (12/13) Continues to have some cough and breathing issues. Aware to go to ED if having worsening shortness of breath. Afebrile since last week.      ROS: per HPI  Pertinent PMHx:  Chronic lower extremity lymphedema Chronic pain Obesity class 3 Migraines Severe OA bilateral knee  Exam:  LMP 05/15/2012   Respiratory: Able to speak in full sentences without pause  Assessment/Plan:  Lymphedema Not improving with wraps. Encouraged to reschedule Bariatric surgery Referral to vascular surgery at this point.  Continue wraps, lifestyle management  Follow up as needed  COVID COVID self test 12/13 positive.  Improving, and afebrile.   Continue self precautions Strict return precautions for any respiratory concerns.    Time spent during visit with patient: 15 minutes

## 2021-02-26 NOTE — Patient Instructions (Signed)
Thank you for coming to see me today. It was a pleasure. Today we talked about:   Referral was sent to Neuro rehab for evaluation of electric wheelchair  Follow up with Wound care in Jan as scheduled  Your PAP smear is overdue. Please schedule an appointment for this at your earliest convenience.  You are due for a colonoscopy.  I would recommend that you have a screening soon.  Please follow-up with PCP as needed  If you have any questions or concerns, please do not hesitate to call the office at [phone].  Best,   [name], MD

## 2021-02-27 NOTE — Telephone Encounter (Signed)
Please call patient and schedule appointment for 2 weeks after her wound care appointment in Jan 2023. Disability forms completed and placed n front office for faxing.  Thank you [name], MD Family Medicine Residency

## 2021-02-28 ENCOUNTER — Encounter: Payer: Self-pay | Admitting: Family Medicine

## 2021-02-28 NOTE — Telephone Encounter (Signed)
They were placed in front office yesterday for faxing. Please let patient know if they have been faxed as well as need to schedule an appointment for mid January.  Thank you  [name], MD Family Medicine Residency

## 2021-03-02 ENCOUNTER — Encounter: Payer: Self-pay | Admitting: Family Medicine

## 2021-03-02 DIAGNOSIS — U071 COVID-19: Secondary | ICD-10-CM | POA: Insufficient documentation

## 2021-03-02 NOTE — Assessment & Plan Note (Signed)
COVID self test 12/13 positive.  Improving, and afebrile.   Continue self precautions Strict return precautions for any respiratory concerns.

## 2021-03-02 NOTE — Assessment & Plan Note (Signed)
Not improving with wraps. Encouraged to reschedule Bariatric surgery Referral to vascular surgery at this point.  Continue wraps, lifestyle management  Follow up as needed

## 2021-03-13 ENCOUNTER — Encounter: Payer: Self-pay | Admitting: Family Medicine

## 2021-03-13 NOTE — Telephone Encounter (Addendum)
Called patient in regards to mychart message of possible infection. Patient reports her leg hurts so bad she can not stand for anything to touch it. Patient reports the pain started on Friday and has progressively worsened. Patient reports the areas have been weeping since November and she has a wound care apt for 1/11. Patient denies any fevers or foul smelling draining.   Patient advised to be evaluated today at [facility] or ED, however patient declined. Apt has been made for tomorrow afternoon for evaluation.   Strict ED precautions given.

## 2021-03-14 ENCOUNTER — Ambulatory Visit: Payer: BC Managed Care – PPO

## 2021-03-15 NOTE — Telephone Encounter (Signed)
agree

## 2021-03-15 NOTE — Telephone Encounter (Signed)
She needs to make an appointment for evaluation or be seen in urgent care

## 2021-03-16 ENCOUNTER — Other Ambulatory Visit: Payer: Self-pay | Admitting: Family Medicine

## 2021-03-16 DIAGNOSIS — L03115 Cellulitis of right lower limb: Secondary | ICD-10-CM

## 2021-03-16 NOTE — Telephone Encounter (Signed)
Patient no showed or cancelled her apt for earlier this week. Looks like she is coming in on 1/12.

## 2021-03-17 ENCOUNTER — Encounter: Payer: Self-pay | Admitting: Family Medicine

## 2021-03-17 ENCOUNTER — Telehealth: Payer: Self-pay | Admitting: Family Medicine

## 2021-03-17 MED ORDER — HYDROCODONE-ACETAMINOPHEN 5-325 MG PO TABS: 1.0000 | ORAL_TABLET | Freq: Four times a day (QID) | ORAL | 0 refills | Status: DC | PRN

## 2021-03-17 MED ORDER — FUROSEMIDE 20 MG PO TABS: 20.0000 mg | ORAL_TABLET | Freq: Every day | ORAL | 0 refills | Status: DC | PRN

## 2021-03-17 MED ORDER — AQUAPHOR EX OINT: TOPICAL_OINTMENT | CUTANEOUS | 0 refills | Status: DC | PRN

## 2021-03-17 NOTE — Telephone Encounter (Signed)
**  After Hours/ Emergency Line Call**  Received a call to report that Catherine Powell stating that she was told she would have refills on her Norco as well as Lasix and she tried her pharmacy but neither 1 was available there.  She request having these refilled.  I informed her that I was not able to refill the controlled substance over the after-hours emergency line but that I would reach out to her PCP who can determine if it is appropriate refill or not..  Patient was appreciative of this. Will forward to PCP.  Jackelyn Poling, DO PGY-3, [facility] 03/17/2021 5:04 AM

## 2021-03-20 ENCOUNTER — Other Ambulatory Visit: Payer: Self-pay

## 2021-03-20 DIAGNOSIS — I890 Lymphedema, not elsewhere classified: Secondary | ICD-10-CM

## 2021-03-21 ENCOUNTER — Encounter (HOSPITAL_BASED_OUTPATIENT_CLINIC_OR_DEPARTMENT_OTHER): Payer: BC Managed Care – PPO | Admitting: Physician Assistant

## 2021-03-21 NOTE — Telephone Encounter (Signed)
err

## 2021-03-22 ENCOUNTER — Encounter: Payer: Self-pay | Admitting: Family Medicine

## 2021-03-22 ENCOUNTER — Other Ambulatory Visit: Payer: Self-pay

## 2021-03-22 ENCOUNTER — Ambulatory Visit (INDEPENDENT_AMBULATORY_CARE_PROVIDER_SITE_OTHER): Payer: BC Managed Care – PPO | Admitting: Family Medicine

## 2021-03-22 VITALS — BP 132/72 | HR 70 | Wt >= 6400 oz

## 2021-03-22 DIAGNOSIS — I872 Venous insufficiency (chronic) (peripheral): Secondary | ICD-10-CM

## 2021-03-22 NOTE — Patient Instructions (Signed)
Thank you for coming to see me today. It was a pleasure. Today we talked about:   Leave UNNA boot on for 1 week.     Please follow-up with PCP in 1 week  If you have any questions or concerns, please do not hesitate to call the office at [phone].  Best,   [name], MD

## 2021-03-22 NOTE — Progress Notes (Signed)
° ° °  SUBJECTIVE:   CHIEF COMPLAINT / HPI: lower extremity wound  Right lower extremity wound not getting any better.  Was scheduled for WOCN yesterday but unable to make appointment.  Now rescheduled for Feb. Endorses multiple falls at home where legs gives out. Has been an ongoing concern for years.  Denies any fevers.  Endorses lower extremity pain and redness.  Has tried multiple different creams to help without success.  Has not been able to schedule Vascular Surgery, Bariatric appointments yet.  Lymphedema wraps not helping.  PERTINENT  PMH / PSH:  [facility]  OBJECTIVE:   BP 132/72    Pulse 70    Wt (!) 426 lb (193.2 kg)    LMP 05/15/2012    SpO2 93%    BMI 64.77 kg/m    General: Alert, no acute distress Lower extremities: bilateral venous stasis dermatitis, right posterior circular wounds without drainage or induration. Mild erythema surrounding granualation tissue. Slight increase in size from previous visit.       ASSESSMENT/PLAN:   Chronic venous insufficiency No concern for infection.   Unna Boot Follow up with WOCN Follow up in 1 week      [name], MD [facility]

## 2021-03-28 ENCOUNTER — Encounter: Payer: Self-pay | Admitting: Family Medicine

## 2021-03-28 NOTE — Assessment & Plan Note (Signed)
No concern for infection.   Unna Boot Follow up with WOCN Follow up in 1 week

## 2021-03-29 ENCOUNTER — Encounter: Payer: Self-pay | Admitting: Family Medicine

## 2021-03-29 ENCOUNTER — Other Ambulatory Visit: Payer: Self-pay

## 2021-03-29 ENCOUNTER — Ambulatory Visit (INDEPENDENT_AMBULATORY_CARE_PROVIDER_SITE_OTHER): Payer: BC Managed Care – PPO | Admitting: Family Medicine

## 2021-03-29 VITALS — BP 136/74 | HR 76 | Ht 68.0 in | Wt >= 6400 oz

## 2021-03-29 DIAGNOSIS — G43109 Migraine with aura, not intractable, without status migrainosus: Secondary | ICD-10-CM

## 2021-03-29 DIAGNOSIS — I83012 Varicose veins of right lower extremity with ulcer of calf: Secondary | ICD-10-CM

## 2021-03-29 DIAGNOSIS — E7800 Pure hypercholesterolemia, unspecified: Secondary | ICD-10-CM

## 2021-03-29 DIAGNOSIS — I10 Essential (primary) hypertension: Secondary | ICD-10-CM

## 2021-03-29 DIAGNOSIS — L03115 Cellulitis of right lower limb: Secondary | ICD-10-CM

## 2021-03-29 DIAGNOSIS — L97211 Non-pressure chronic ulcer of right calf limited to breakdown of skin: Secondary | ICD-10-CM

## 2021-03-29 NOTE — Progress Notes (Signed)
° ° °  SUBJECTIVE:   CHIEF COMPLAINT / HPI: follow up lower extremity wound  Right lower posterior leg wound improved with use of UNNA boot.  Was not able to keep on for full 7 days. Denies any fevers, weeping. Pain has minimally improved. WOCN scheduled for February.  Would like electric wheelchair ordered.  PERTINENT  PMH / PSH:  Chronic Venous Insufficiency Chronic Venous Stasis Lymphadenopathy  OBJECTIVE:   BP 136/74    Pulse 76    Ht 5\' 8"  (1.727 m)    Wt (!) 426 lb (193.2 kg)    LMP 05/15/2012    SpO2 96%    BMI 64.77 kg/m    General: Alert, no acute distress Derm: right posterior lower extremity wound healing.  No surrounding erythema or induration.  No drainage   ASSESSMENT/PLAN:   Venous stasis ulcer (HCC) Mild improvement with UNNA boot use -Continue UNNA boot weekly in RN clinic -Follow up with WOCN as scheduled -Strict return precautions provided -Follow up with PCP in 2 weeks  Migraine Refill Propranolol 40 mg BID Refill Imitrex 50 mg use as needed Refill Topamax 100 mg BID Referral to Neurology previously sent, patient had not been able to make   Essential hypertension Refill Quinipril 20 mg daily  HYPERCHOLESTEROLEMIA Refill Lipitor 40 mg daily     07/15/2012, MD [facility]

## 2021-03-29 NOTE — Patient Instructions (Addendum)
Thank you for coming to see me today. It was a pleasure. Today we talked about:   Follow up with wound care as scheduled  Return for [facility] boot change next Wednesday or Thursday in nurse clinic  Please follow-up with PCP in 2 weeks   If you have any questions or concerns, please do not hesitate to call the office at [phone].  Best,   [name], MD

## 2021-03-30 ENCOUNTER — Telehealth: Payer: Self-pay

## 2021-03-30 NOTE — Telephone Encounter (Signed)
Patient calls nurse line reporting she requested 5 refills yesterday at [facility], however [facility] has not received.   Patient reports she does not remember the 5, "I circled them on my paper yesterday."   Will forward to PCP.

## 2021-03-31 MED ORDER — TOPIRAMATE 100 MG PO TABS: 100.0000 mg | ORAL_TABLET | Freq: Two times a day (BID) | ORAL | 3 refills | Status: DC

## 2021-03-31 MED ORDER — ATORVASTATIN CALCIUM 40 MG PO TABS: 40.0000 mg | ORAL_TABLET | Freq: Every evening | ORAL | 3 refills | Status: DC

## 2021-03-31 MED ORDER — QUINAPRIL HCL 20 MG PO TABS
20.0000 mg | ORAL_TABLET | Freq: Every day | ORAL | 3 refills | Status: DC
Start: 2021-03-31 — End: 2021-07-23

## 2021-03-31 MED ORDER — PROPRANOLOL HCL 40 MG PO TABS: 80.0000 mg | ORAL_TABLET | Freq: Two times a day (BID) | ORAL | 3 refills | Status: DC

## 2021-03-31 MED ORDER — SUMATRIPTAN SUCCINATE 50 MG PO TABS: ORAL_TABLET | ORAL | 3 refills | Status: DC

## 2021-03-31 NOTE — Telephone Encounter (Signed)
sent 

## 2021-04-03 ENCOUNTER — Encounter: Payer: Self-pay | Admitting: Family Medicine

## 2021-04-03 DIAGNOSIS — I83009 Varicose veins of unspecified lower extremity with ulcer of unspecified site: Secondary | ICD-10-CM | POA: Insufficient documentation

## 2021-04-03 NOTE — Assessment & Plan Note (Addendum)
Mild improvement with Louretta Parma boot use -Continue UNNA boot weekly in RN clinic -Follow up with WOCN as scheduled -Strict return precautions provided -Follow up with PCP in 2 weeks

## 2021-04-03 NOTE — Assessment & Plan Note (Signed)
Refill Lipitor 40 mg daily °

## 2021-04-03 NOTE — Assessment & Plan Note (Signed)
Refill Propranolol 40 mg BID Refill Imitrex 50 mg use as needed Refill Topamax 100 mg BID Referral to Neurology previously sent, patient had not been able to make

## 2021-04-03 NOTE — Assessment & Plan Note (Signed)
Refill Quinipril 20 mg daily

## 2021-04-04 ENCOUNTER — Telehealth: Payer: Self-pay

## 2021-04-04 ENCOUNTER — Encounter: Payer: Self-pay | Admitting: Family Medicine

## 2021-04-04 NOTE — Progress Notes (Signed)
° ° °  SUBJECTIVE:   CHIEF COMPLAINT / HPI: follow up right lower leg wound  No acute concerns. Denies any fevers, increased drainage.  Took UNNA boot off for shower and replaced.  Reports having lower extremity u/s and Vascular Surgery appointment on Friday.    PERTINENT  PMH / PSH:  Obesity class 3 HTN Chronic Lymphedema Chronic Venous insufficiency Venous statis dermatitis  OBJECTIVE:   BP (!) 154/84    Pulse 81    Ht 5\' 8"  (1.727 m)    Wt (!) 430 lb 6.4 oz (195.2 kg)    LMP 05/15/2012    SpO2 98%    BMI 65.44 kg/m    General: Alert, no acute distress Right lower extremity wound:  Slightly improved from previous visit.  No induration.  Wounds healing     ASSESSMENT/PLAN:   Venous stasis ulcer (HCC) Improving with 07/15/2012 boot May keep off until evaluation completed by Vascular and u/s completed Return to nurse clinic Monday morning for reapplication of UNNA boot Follow up with [facility] Continue to recommend Weight loss management, unable to tolerate GI side effects of Saxenda, did not complete Healthy Weight Loss program Bariatric surgery previously placed, patient to call to reschedule appointment Electric wheelchair prescription provided to patient Follow up with PCP every other week while Saturday boot on until evaluated by [facility] RN clinic every week for Roland Rack change, schedule on PCP clinic days  Degenerative joint disease of knees Refill Norco 325/5 mg x 32 tabs     [employer], MD [facility]

## 2021-04-04 NOTE — Telephone Encounter (Signed)
Pt called to see if we could do u/s at her appt this week over her unna boot. Per her PCP office note in Epic she is to call them to schedule for it to be removed and replaced after appt . Pt is aware of this and verbalized understanding.

## 2021-04-04 NOTE — Telephone Encounter (Signed)
Remove boot for ultrasound and schedule appointment in RN clinic to replace after.  Thanks

## 2021-04-05 ENCOUNTER — Other Ambulatory Visit: Payer: Self-pay

## 2021-04-05 ENCOUNTER — Ambulatory Visit (INDEPENDENT_AMBULATORY_CARE_PROVIDER_SITE_OTHER): Payer: BC Managed Care – PPO | Admitting: Family Medicine

## 2021-04-05 ENCOUNTER — Encounter: Payer: Self-pay | Admitting: Family Medicine

## 2021-04-05 VITALS — BP 154/84 | HR 81 | Ht 68.0 in | Wt >= 6400 oz

## 2021-04-05 DIAGNOSIS — L03115 Cellulitis of right lower limb: Secondary | ICD-10-CM | POA: Diagnosis not present

## 2021-04-05 DIAGNOSIS — I872 Venous insufficiency (chronic) (peripheral): Secondary | ICD-10-CM | POA: Diagnosis not present

## 2021-04-05 DIAGNOSIS — L97211 Non-pressure chronic ulcer of right calf limited to breakdown of skin: Secondary | ICD-10-CM

## 2021-04-05 DIAGNOSIS — M5412 Radiculopathy, cervical region: Secondary | ICD-10-CM

## 2021-04-05 DIAGNOSIS — I83012 Varicose veins of right lower extremity with ulcer of calf: Secondary | ICD-10-CM

## 2021-04-05 DIAGNOSIS — M170 Bilateral primary osteoarthritis of knee: Secondary | ICD-10-CM

## 2021-04-05 DIAGNOSIS — I890 Lymphedema, not elsewhere classified: Secondary | ICD-10-CM | POA: Diagnosis not present

## 2021-04-05 MED ORDER — HYDROCODONE-ACETAMINOPHEN 5-325 MG PO TABS: 1.0000 | ORAL_TABLET | Freq: Four times a day (QID) | ORAL | 0 refills | Status: DC | PRN

## 2021-04-05 NOTE — Telephone Encounter (Signed)
Patient calls nurse line regarding norco prescription. [facility] is currently out of stock. Patient is requesting that rx be sent to the [facility] on [address].   I have canceled the [facility] at [facility] and pended medication for the preferred [facility].   Veronda Prude, RN

## 2021-04-05 NOTE — Patient Instructions (Signed)
Thank you for coming to see me today. It was a pleasure.   Follow up with Vascular surgery  Follow up in RN clinic Friday afternoon after vascular appointment or Monday morning foe UNNA boot wrap  Follow up with Wound Care Feb 10th as scheduled  Please follow-up with PCP as needed  If you have any questions or concerns, please do not hesitate to call the office at [phone].  Best,   [name], MD

## 2021-04-06 ENCOUNTER — Encounter: Payer: Self-pay | Admitting: Vascular Surgery

## 2021-04-06 ENCOUNTER — Ambulatory Visit (INDEPENDENT_AMBULATORY_CARE_PROVIDER_SITE_OTHER): Payer: BC Managed Care – PPO | Admitting: Vascular Surgery

## 2021-04-06 ENCOUNTER — Ambulatory Visit (HOSPITAL_COMMUNITY)
Admission: RE | Admit: 2021-04-06 | Discharge: 2021-04-06 | Disposition: A | Discharge status: Home / Self Care | Payer: BC Managed Care – PPO | Source: Ambulatory Visit | Attending: Vascular Surgery | Admitting: Vascular Surgery

## 2021-04-06 VITALS — BP 130/62 | HR 73 | Temp 97.7°F | Resp 20 | Ht 68.0 in | Wt >= 6400 oz

## 2021-04-06 DIAGNOSIS — I872 Venous insufficiency (chronic) (peripheral): Secondary | ICD-10-CM | POA: Diagnosis not present

## 2021-04-06 DIAGNOSIS — I890 Lymphedema, not elsewhere classified: Secondary | ICD-10-CM | POA: Diagnosis not present

## 2021-04-06 DIAGNOSIS — R609 Edema, unspecified: Secondary | ICD-10-CM

## 2021-04-06 NOTE — Progress Notes (Signed)
Office Note     CC:  RLE wounds Requesting Provider:  [name], MD  HPI: TAMATHA GADBOIS is a 56 y.o. (06/02/1965) female presenting at the request of .[name], MD for RLE calf wounds.   Yesena presents today accompanied by her son.  Over the last several years, Keylee has appreciated significant weight gain, currently weighing 430 pounds, with a BMI of 66.  This is left her relatively nonambulatory.  She spends most of her time in a wheelchair in the dependent position.  Over the last year, she has appreciated even more pronounced bilateral lower extremity pitting edema.  At the time she was diagnosed with lymphedema and has been intermittently using lymphedema wraps that were custom ordered for her.  Since October, Caidynce is appreciated to nonhealing wounds on her right calf.  She has been going to wound care, and undergoing Unna boot therapy in an effort to heal the lesions.   These wounds have failed to heal over the last 3 months.  A referral was sent to my office for further recommendations and for chronic venous insufficiency work-up.  On exam, Charmane was doing well.  She denied symptoms of claudication, rest pain, tissue loss.  Her ambulatory status has been severely hampered by her weight.  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   The pt is  on a statin for cholesterol management.  The pt is not on a daily aspirin.   Other AC:  - The pt is not on medication for hypertension.   The pt is not diabetic.  Tobacco hx:  none  Past Medical History:  Diagnosis Date   Allergic rhinitis    Asthma    Carpal tunnel syndrome 05/08/2006   Degenerative joint disease of knees 05/01/2010   Qualifier: Diagnosis of  By: Christella Hartigan MD, Bret     Depression 12/16/2006   Qualifier: Diagnosis of  By: Irving Burton MD, [name]     ESSENTIAL HYPERTENSION 05/22/2006   Qualifier: Diagnosis of  By: Irving Burton MD, Carollee Herter REFLUX, NO  ESOPHAGITIS 05/08/2006   Qualifier: Diagnosis of  By: Irving Burton MD, [name]     Heart murmur    slight due to Rheumatic fever had cardiac studies 2009 Dr. Antoine Poche   HYPERCHOLESTEROLEMIA 05/08/2006   Qualifier: Diagnosis of  By: Irving Burton MD, [name]     Lymphedema associated with obesity    Rheumatic fever    Right knee meniscal tear    and loose body   SINUSITIS, CHRONIC, NOS 05/08/2006    Past Surgical History:  Procedure Laterality Date   APPENDECTOMY     CARDIAC CATHETERIZATION     2009 Dr. Antoine Poche   CERVICAL FUSION     CHOLECYSTECTOMY     KNEE ARTHROSCOPY Right 06/17/2016   Procedure: RIGHT KNEE ARTHROSCOPY;  Surgeon: Gean Birchwood, MD;  Location: [facility] OR;  Service: Orthopedics;  Laterality: Right;   WISDOM TOOTH EXTRACTION      Social History   Socioeconomic History   Marital status: Married    Spouse name: Not on file   Number of children: 1   Years of education: Not on file   Highest education level: Not on file  Occupational History   Not on file  Tobacco Use   Smoking status: Never   Smokeless tobacco: Never  Substance and Sexual Activity   Alcohol use: Yes    Comment: rare   Drug use: No   Sexual  activity: Yes  Other Topics Concern   Not on file  Social History Narrative   Not on file   Social Determinants of Health   Financial Resource Strain: Not on file  Food Insecurity: Not on file  Transportation Needs: Not on file  Physical Activity: Not on file  Stress: Not on file  Social Connections: Not on file  Intimate Partner Violence: Not on file    Family History  Problem Relation Age of Onset   Migraines Mother     Current Outpatient Medications  Medication Sig Dispense Refill   albuterol (VENTOLIN HFA) 108 (90 Base) MCG/ACT inhaler Inhale 2 puffs into the lungs every 4 (four) hours as needed for wheezing. 3.7 g 3   atorvastatin (LIPITOR) 40 MG tablet Take 1 tablet (40 mg total) by mouth every evening. 90 tablet 3   Cholecalciferol ([facility] D3) 25 MCG  (1000 UT) capsule Take 1 capsule (1,000 Units total) by mouth daily. 90 capsule 3   co-enzyme Q-10 50 MG capsule TAKE 1 CAPSULE (30 MG TOTAL) BY MOUTH 2 (TWO) TIMES DAILY. 180 capsule 3   esomeprazole (NEXIUM) 40 MG capsule TAKE 1 CAPSULE DAILY AT 12 NOON 90 capsule 3   fluocinonide (LIDEX) 0.05 % external solution Apply 1 application topically 2 (two) times daily. 60 mL 1   furosemide (LASIX) 20 MG tablet Take 1 tablet (20 mg total) by mouth daily as needed. 15 tablet 0   HYDROcodone-acetaminophen (NORCO) 5-325 MG tablet Take 1 tablet by mouth every 6 (six) hours as needed for moderate pain. 32 tablet 0   Melatonin 10 MG TABS Take 10 mg by mouth at bedtime. 90 tablet 3   mineral oil-hydrophilic petrolatum (AQUAPHOR) ointment Apply topically as needed for dry skin. 420 g 0   montelukast (SINGULAIR) 10 MG tablet Take 1 tablet (10 mg total) by mouth at bedtime. 30 tablet 3   Multiple Vitamins-Minerals (MULTIVITAMIN WOMEN 50+) TABS Take 1 tablet by mouth daily. 90 tablet 3   nystatin cream (MYCOSTATIN) Apply topically 2 (two) times daily. 30 g 0   Omega-3 Fatty Acids (FISH OIL) 435 MG CAPS Take 1 capsule (435 mg total) by mouth daily. 60 each 11   propranolol (INDERAL) 40 MG tablet Take 2 tablets (80 mg total) by mouth 2 (two) times daily. 360 tablet 3   quinapril (ACCUPRIL) 20 MG tablet Take 1 tablet (20 mg total) by mouth at bedtime. 90 tablet 3   Riboflavin-Magnesium-Feverfew 100-90-25 MG TABS Take 100 mg by mouth daily. 90 tablet 3   Skin Protectants, Misc. (ABSORBASE) OINT Apply to affected area daily as needed 454 g 3   Soap & Cleansers (AQUA GLYCOLIC SHAMPOO/BODY) LIQD Apply 1 application topically daily. 237 mL 0   sodium chloride (OCEAN) 0.65 % SOLN nasal spray Place 1 spray into both nostrils as needed for congestion.     SUMAtriptan (IMITREX) 50 MG tablet Take one tablet once. May repeat after 2 hours if needed. Maximum 4 tablets in 24 hours. 10 tablet 3   SYMBICORT 160-4.5 MCG/ACT inhaler  USE 1 INHALATION TWICE A DAY 10.2 g 5   terbinafine (LAMISIL) 1 % cream Apply 1 application topically 2 (two) times daily. 30 g 0   topiramate (TOPAMAX) 100 MG tablet Take 1 tablet (100 mg total) by mouth 2 (two) times daily. 60 tablet 3   Vitamin D, Ergocalciferol, (DRISDOL) 1.25 MG (50000 UT) CAPS capsule TAKE 1 CAPSULE EVERY 7 DAYS 20 capsule 3   No current facility-administered medications for  this visit.    Allergies  Allergen Reactions   Tolectin Ds [Tolmetin Sodium] Hives and Shortness Of Breath     REVIEW OF SYSTEMS:   [X]  denotes positive finding, [ ]  denotes negative finding Cardiac  Comments:  Chest pain or chest pressure:    Shortness of breath upon exertion:    Short of breath when lying flat:    Irregular heart rhythm:        Vascular    Pain in calf, thigh, or hip brought on by ambulation:    Pain in feet at night that wakes you up from your sleep:     Blood clot in your veins:    Leg swelling:         Pulmonary    Oxygen at home:    Productive cough:     Wheezing:         Neurologic    Sudden weakness in arms or legs:     Sudden numbness in arms or legs:     Sudden onset of difficulty speaking or slurred speech:    Temporary loss of vision in one eye:     Problems with dizziness:         Gastrointestinal    Blood in stool:     Vomited blood:         Genitourinary    Burning when urinating:     Blood in urine:        Psychiatric    Major depression:         Hematologic    Bleeding problems:    Problems with blood clotting too easily:        Skin    Rashes or ulcers:        Constitutional    Fever or chills:      PHYSICAL EXAMINATION:  There were no vitals filed for this visit.  General:  WDWN in NAD; vital signs documented above Gait: Not observed HENT: WNL, normocephalic Pulmonary: normal non-labored breathing , without wheezing Cardiac: regular HR, Abdomen: soft, NT, no masses Skin: without rashes Vascular Exam/Pulses:  Right  Left  Radial 2+ (normal) 2+ (normal)  Ulnar 2+ (normal) 2+ (normal)  Femoral    Popliteal    DP mulitphasic Multiphasic   PT Multiphasic  Multiphasic    Extremities: without ischemic changes, without Gangrene , without cellulitis; with open wounds;  Musculoskeletal: no muscle wasting or atrophy  Neurologic: A&O X 3;  No focal weakness or paresthesias are detected Psychiatric:  The pt has Normal affect.   Non-Invasive Vascular Imaging:     Summary:  Right:  - No evidence of deep vein thrombosis seen in the right lower extremity,  from the common femoral through the popliteal veins.  - No evidence of superficial venous thrombosis in the right lower  extremity.     - Venous reflux is noted in the right sapheno-femoral junction.  - Venous reflux is noted in the right greater saphenous vein in the thigh.  - Venous reflux is noted in the right greater saphenous vein in the calf.  - Venous reflux is noted in the right short saphenous vein.  - Venous reflux is noted in the right perforator vein.     - Very limited exam due to body habitus.    ASSESSMENT/PLAN: Mliss FritzBeverly H Craker is a 56 y.o. female presenting with phelbolymphedema with accompanying lipedema.   I had a long discussion with Meriam SpragueBeverly regarding her diagnosis is well as her current  weight.  Fortunately Doppler insonation of her dorsalis pedis and posterior tibial vessels sounded normal.  At a BMI of 66, any recommended therapy will be difficult.  We discussed that if she only had venous insufficiency, and a wound on the right leg, Meriam SpragueBeverly would be best treated with venous ablation and compression.  If lymphedema was only present, she would be best treated with manual lymphatic drainage and sequential compression. Unfortunately, this is not the case, as she has significant lipedema.  This is a very tough diagnosis as adipose tissue has a very difficult time healing.  Furthermore, therapies such as manual lymphatic drainage do not  improve lipedema, nor does venous ablation improve lipedema.  Meriam SpragueBeverly would be best served with a referral to bariatric surgery.  Weight loss, and therefore adipose tissue loss, will significantly reduce the size of her lower extremities and promote wound healing.  Her venous comp exam was reviewed demonstrating venous insufficiency.  This could also improve with weight loss.  Unfortunately, therapies aimed at improving the venous outflow, such as laser ablation, can create wounds.  This would be the last thing I would want to do to [facility].  I asked that she continue compression stockings, and follow-up with bariatric surgery.  At the age of 56, she is aware that her life expectancy will be cut short, if she is unable to lose weight.  I told her I am available at her convenience, and am happy to take care of her.  Should she have significant weight loss, I am happy to offer therapy such as venous ablation and manual lymphatic drainage.   Victorino SparrowJoshua E Adelheid Hoggard, MD [facility] [phone]

## 2021-04-08 ENCOUNTER — Encounter: Payer: Self-pay | Admitting: Family Medicine

## 2021-04-08 NOTE — Assessment & Plan Note (Addendum)
[facility] with [facility] May keep off until evaluation completed by Vascular and u/s completed Return to nurse clinic Monday morning for reapplication of UNNA boot Follow up with [facility] Continue to recommend Weight loss management, unable to tolerate GI side effects of Saxenda, did not complete Healthy Weight Loss program Bariatric surgery previously placed, patient to call to reschedule appointment Electric wheelchair prescription provided to patient Follow up with PCP every other week while Roland Rack boot on until evaluated by [facility] RN clinic every week for [employer], schedule on PCP clinic days

## 2021-04-08 NOTE — Assessment & Plan Note (Signed)
Refill Norco 325/5 mg x 32 tabs

## 2021-04-09 ENCOUNTER — Other Ambulatory Visit: Payer: Self-pay

## 2021-04-09 ENCOUNTER — Ambulatory Visit: Payer: BC Managed Care – PPO

## 2021-04-09 ENCOUNTER — Telehealth: Payer: Self-pay

## 2021-04-09 ENCOUNTER — Ambulatory Visit: Payer: BC Managed Care – PPO | Attending: Family Medicine | Admitting: Physical Therapy

## 2021-04-09 ENCOUNTER — Ambulatory Visit (INDEPENDENT_AMBULATORY_CARE_PROVIDER_SITE_OTHER): Payer: BC Managed Care – PPO | Admitting: Family Medicine

## 2021-04-09 VITALS — BP 169/72 | HR 76

## 2021-04-09 DIAGNOSIS — R208 Other disturbances of skin sensation: Secondary | ICD-10-CM | POA: Insufficient documentation

## 2021-04-09 DIAGNOSIS — M542 Cervicalgia: Secondary | ICD-10-CM | POA: Insufficient documentation

## 2021-04-09 DIAGNOSIS — R262 Difficulty in walking, not elsewhere classified: Secondary | ICD-10-CM | POA: Insufficient documentation

## 2021-04-09 DIAGNOSIS — R2681 Unsteadiness on feet: Secondary | ICD-10-CM | POA: Insufficient documentation

## 2021-04-09 DIAGNOSIS — I890 Lymphedema, not elsewhere classified: Secondary | ICD-10-CM | POA: Insufficient documentation

## 2021-04-09 DIAGNOSIS — R296 Repeated falls: Secondary | ICD-10-CM | POA: Diagnosis not present

## 2021-04-09 DIAGNOSIS — L03115 Cellulitis of right lower limb: Secondary | ICD-10-CM | POA: Diagnosis not present

## 2021-04-09 DIAGNOSIS — M6281 Muscle weakness (generalized): Secondary | ICD-10-CM | POA: Insufficient documentation

## 2021-04-09 DIAGNOSIS — L97211 Non-pressure chronic ulcer of right calf limited to breakdown of skin: Secondary | ICD-10-CM

## 2021-04-09 DIAGNOSIS — I83012 Varicose veins of right lower extremity with ulcer of calf: Secondary | ICD-10-CM | POA: Diagnosis not present

## 2021-04-09 DIAGNOSIS — R293 Abnormal posture: Secondary | ICD-10-CM | POA: Insufficient documentation

## 2021-04-09 MED ORDER — FLUCONAZOLE 150 MG PO TABS: 150.0000 mg | ORAL_TABLET | Freq: Once | ORAL | 0 refills | Status: AC

## 2021-04-09 MED ORDER — CEPHALEXIN 500 MG PO CAPS: 500.0000 mg | ORAL_CAPSULE | Freq: Four times a day (QID) | ORAL | 0 refills | Status: AC

## 2021-04-09 NOTE — Patient Instructions (Signed)
It was nice seeing you today!  Take cephalexin 4 times a day for 7 days. Diflucan if needed.  Follow-up with Dr. Clent Ridges in the next 1-2 weeks.  Stay well, Catherine Deeds, MD [facility] [phone]  --  Make sure to check out at the front desk before you leave today.  Please arrive at least 15 minutes prior to your scheduled appointments.  If you had blood work today, I will send you a MyChart message or a letter if results are normal. Otherwise, I will give you a call.  If you had a referral placed, they will call you to set up an appointment. Please give [place] a call if you don't hear back in the next 2 weeks.  If you need additional refills before your next appointment, please call your pharmacy first.

## 2021-04-09 NOTE — Progress Notes (Signed)
° ° °  SUBJECTIVE:   CHIEF COMPLAINT / HPI:  Chief Complaint  Patient presents with   Wound Check    Concern for wound infection, see telephone note in chart for further details.  She has had a wound on her right calf since October.  She has been getting Unna boots placed over the past 3 weeks with improvement.  She had the Unna boot removed on 1/26 as she had an appointment with vascular surgery on 1/27.  She was supposed to return to clinic on 1/27 to have Unna boot replaced however she was not able to make the appointment.  Over the past 2 days, she reported increased swelling, pain, and warmth from her wound.  Also reported malodorous drainage.  Denies fever.  She did receive a course of cephalexin 2 months ago for cellulitis of the wound.  She has in initial appointment with wound care on 2/10.  Regarding elevated blood pressure, she states her blood pressure is high because of the pain.  Denies headache, vision changes, chest pain, shortness of breath.  PERTINENT  PMH / PSH: Chronic right lower extremity wound, lymphedema  Patient Care Team: [name], MD as PCP - General (Family Medicine)   OBJECTIVE:   BP (!) 169/72    Pulse 76    LMP 05/15/2012    SpO2 95%   Physical Exam Constitutional:      General: She is not in acute distress. Cardiovascular:     Rate and Rhythm: Normal rate and regular rhythm.  Pulmonary:     Effort: Pulmonary effort is normal. No respiratory distress.     Breath sounds: Normal breath sounds.  Musculoskeletal:     Cervical back: Neck supple.  Skin:    Comments: Right posterior calf wound with minimal surrounding erythema and significant tenderness to palpation around the wound.  Neurological:     Mental Status: She is alert.       Depression screen [facility] 2/9 04/09/2021  Decreased Interest 0  Down, Depressed, Hopeless 1  PHQ - 2 Score 1  Altered sleeping 0  Tired, decreased energy 0  Change in appetite 0  Feeling bad or failure about yourself   0  Trouble concentrating 1  Moving slowly or fidgety/restless 0  Suicidal thoughts 0  PHQ-9 Score 2  Difficult doing work/chores -  Some recent data might be hidden     {Show previous vital signs (optional):23777}    ASSESSMENT/PLAN:   Cellulitis of right lower extremity Right posterior calf wound appears to be healing well.  Does not appear overtly infected with minimal erythema though with significant tenderness and reported drainage, will treat for cellulitis. - cephalexin 500 mg QID x 7d - fluconazole per pt request - Unna boot replaced - wound care appt 2/10    Return in about 2 weeks (around 04/23/2021) for f/u cellulitis, wound.   Littie Deeds, MD [facility]

## 2021-04-09 NOTE — Assessment & Plan Note (Signed)
Right posterior calf wound appears to be healing well.  Does not appear overtly infected with minimal erythema though with significant tenderness and reported drainage, will treat for cellulitis. - cephalexin 500 mg QID x 7d - fluconazole per pt request - Unna boot replaced - wound care appt 2/10

## 2021-04-09 NOTE — Telephone Encounter (Signed)
Patient presents in nurse clinic for unna boot placement of right LE. Patient has had several unna boots over the last few weeks until she can see wound care. Patient had an apt on Friday with vascular and could not have unna boot on. Therefore, unna boot was removed on Thursday at [facility] and patient was to FU on Friday after vascular apt for unna boot placement. Patient reports car trouble and rescheduled for today in nurse clinic.   Patient reports swelling, 6/10 pain, malodorous drainage over the weekend and "very warm to touch." The wound does appear to have gotten worse since last unna boot placement (by myself.) Patient denied any fevers or SOB.  Patient scheduled for evaluation with provider for assessment.

## 2021-04-09 NOTE — Therapy (Addendum)
[facility] [address] [place], [place], [address] Phone: [phone]   Fax:  [phone]  Physical Therapy Evaluation  Patient Details  Name: Catherine Powell MRN: [id number] Date of Birth: March 23, 1965 Referring Provider (PT): Carollee Leitz, MD   Encounter Date: 04/09/2021   PT End of Session - 04/09/21 2044     Visit Number 1    Number of Visits 1    Date for PT Re-Evaluation 04/09/21    Authorization Type [employer] PPO    PT Start Time 9622    PT Stop Time 1500    PT Time Calculation (min) 55 min    Activity Tolerance Patient limited by pain    Behavior During Therapy [facility] for tasks assessed/performed             Past Medical History:  Diagnosis Date   Allergic rhinitis    Asthma    Carpal tunnel syndrome 05/08/2006   Degenerative joint disease of knees 05/01/2010   Qualifier: Diagnosis of  By: Ardis Hughs MD, Bret     Depression 12/16/2006   Qualifier: Diagnosis of  By: Dorathy Daft MD, Marjory Lies     ESSENTIAL HYPERTENSION 05/22/2006   Qualifier: Diagnosis of  By: Dorathy Daft MD, Argentina Donovan REFLUX, NO ESOPHAGITIS 05/08/2006   Qualifier: Diagnosis of  By: Dorathy Daft MD, Marjory Lies     Heart murmur    slight due to Rheumatic fever had cardiac studies 2009 Dr. Percival Spanish   HYPERCHOLESTEROLEMIA 05/08/2006   Qualifier: Diagnosis of  By: Dorathy Daft MD, Marjory Lies     Lymphedema associated with obesity    Morbid obesity with BMI of 60.0-69.9, adult ([place])    Rheumatic fever    Right knee meniscal tear    and loose body   SINUSITIS, CHRONIC, NOS 05/08/2006    Past Surgical History:  Procedure Laterality Date   APPENDECTOMY     CARDIAC CATHETERIZATION     2009 Dr. Percival Spanish   CERVICAL FUSION     CHOLECYSTECTOMY     KNEE ARTHROSCOPY Right 06/17/2016   Procedure: RIGHT KNEE ARTHROSCOPY;  Surgeon: Frederik Pear, MD;  Location: [facility];  Service: Orthopedics;  Laterality: Right;   WISDOM TOOTH EXTRACTION      There were no  vitals filed for this visit.    Subjective Assessment - 04/09/21 2039     Subjective Pt referred for evaluation for power mobility.  Pt has not been able to work since [place]; is no longer able to ambulate at home.  Has had multiple falls.    Patient is accompained by: Family member    Pertinent History Asthma, Carpal tunnel syndrome, DJD of bilat knees, depression, HTN, GERD, heart murmur due to rheumatic fever, lymphedema associated with obesity, morbid obesity, R knee meniscal tear with R arthroscopy, cervical spine fusion    Patient Stated Goals To regain independent mobility in her home and community    Currently in Pain? Yes                [facility] PT Assessment - 04/09/21 2042       Assessment   Medical Diagnosis E66.01 (ICD-10-CM) - Obesity, Class III, BMI 40-49.9 (morbid obesity) ([place])    Referring Provider (PT) Carollee Leitz, MD    Onset Date/Surgical Date 02/09/21    Hand Dominance Right    Prior Therapy OT for lymphedema      Precautions   Precautions Other (comment);Fall    Precaution Comments Asthma, Carpal tunnel syndrome, DJD of  bilat knees, depression, HTN, GERD, heart murmur due to rheumatic fever, lymphedema associated with obesity, morbid obesity, R knee meniscal tear with R arthroscopy, cervical spine fusion      Balance Screen   Has the patient fallen in the past 6 months Yes    How many times? 4    Has the patient had a decrease in activity level because of a fear of falling?  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 [address] Physician: Carollee Leitz, MD This evaluation/justification form will serve as the LMN for the following suppliers: __________________________ Supplier: [employer] Contact Person: Deberah Pelton, Wess Botts Phone:  [phone]   Seating Therapist: Misty Stanley, PT, DPT Phone:   [phone]   Phone: [phone]    Spouse/Parent/Caregiver name: Dorna Mai)  Phone number: [phone] Insurance/Payer: Lorella Nimrod PPO     Reason for Referral: Power mobility  Patient/Caregiver Goals: To return to independent mobility  Patient was seen for face-to-face evaluation for new power wheelchair.  Also present was patient's son and [employer] to discuss recommendations and wheelchair options.  Further paperwork was completed and sent to vendor.  Patient appears to qualify for power mobility device at this time per objective findings.   MEDICAL HISTORY: Diagnosis: Primary Diagnosis: E66.01 (ICD-10-CM) - Obesity, Class III, BMI 40-49.9 (morbid obesity) (H[facility]) Onset:  Diagnosis:    _0 Progressive Disease Relevant past and future surgeries: No upcoming sugeries; Past surgeries include: cervical spine fusion, Right knee arthroscopy   Height: 5'8" Weight: 428 lb Explain recent changes or trends in weight: weight fluctuates with lymphedema; gets Unna wraps for edema   History including Falls: 4 falls in the past 6 months - requires EMS to get up off floor; son and husband not able to assist.  One fall pt hit her head.  PMH includes: Asthma, Carpal tunnel syndrome, DJD of bilat knees, depression, HTN, GERD, heart murmur due to rheumatic fever, lymphedema associated with obesity, morbid obesity, R knee meniscal tear     HOME ENVIRONMENT: _1 House  _2 Condo/town home  _3 Apartment  _4 Assisted Living    _5 Lives Alone _6  Lives with Others                                                                                           Hours with caregiver: Son provides assistance, husband had pacemaker placed and is unable to assist  _7 Home is accessible to patient           Stairs      _8 Yes _9   No     Ramp _0 Yes _1 No Comments:  3 to front porch but pt is going to build a ramp.  House is one level.  Wood floors.  Will be widening doors as needed     COMMUNITY ADL: TRANSPORTATION: _2 Car    _3 Van    <WUXLKGMWNUUVOZDG>_6<\/YQIHKVQQVZDGLOVF>_6 Public Transportation    _5 Adapted w/c Lift    _6 Ambulance    _7 Other:       _8 Sits in wheelchair during transport  Employment/School: Not able to work right now; has been out of work since Nov 11.  Power wheelchair will allow pt to return to work. Specific requirements pertaining to mobility Work place is accessible to a power wheelchair.  Works seated at [employer].    Other: Is going to trade in her car for a handicap van    FUNCTIONAL/SENSORY PROCESSING SKILLS:  Handedness:   _9 Right     _10 Left    _11 NA  Comments:    Functional Processing Skills for Wheeled Mobility _12 Processing Skills are adequate for safe wheelchair operation  Areas of concern than may interfere with safe operation of wheelchair Description of problem   _13  Attention to environment      _14 Judgment      _15  Hearing  _16  Vision or visual processing      _17 Motor Planning  _18  Fluctuations in Behavior      VERBAL COMMUNICATION: _19 WFL receptive _20  WFL expressive _21 Understandable  _22 Difficult to understand  _23 non-communicative _24  Uses an augmented communication device  CURRENT SEATING / MOBILITY: Current Mobility Base:  _25 None _26 Dependent _27 Manual _28 Scooter _29 Power  Type of Control:   Manufacturer:  Size:  Age:   Current Condition of Mobility Base:     Current Wheelchair components:    Describe posture in present seating system:        SENSATION and SKIN ISSUES: Sensation _30 Intact  _31 Impaired _32 Absent  Level of sensation: neuropathy in hands and feet; carpal tunnel in bilat hands Pressure Relief: Able to perform effective pressure relief :     _33 Yes  _34  No Method:  If not, Why?: Requires +2 assistance for sit > stand and maintain standing without fall  Skin Issues/Skin Integrity Current Skin Issues  _35 Yes _36 No _37 Intact _38  Red area_39  Open Area  _40 Scar Tissue _41 At risk from prolonged sitting Where  R posterior calf and dorsum of R foot; lymphedema bilat LE  History of Skin Issues  _42 Yes _43 No Where   When    Hx of skin flap surgeries  _44 Yes _45 No Where   When    Limited sitting tolerance _46 Yes _47 No Hours spent sitting in wheelchair daily: >8 hours but has to stand every hour with +2 assistance for pressure relief and due to pain in LE  Complaint of Pain:  Please describe: hips and knees due to severe OA; hurt neck in most recent fall    Swelling/Edema: bilat LE - lymphedema   ADL STATUS (in reference to wheelchair use):  Indep Assist Unable Indep with Equip Not assessed Comments  Dressing _48  _49  _50  _51  _52  family assists her; performs seated  Eating _53  _54  _55  _56  _57  family has to bring food/drink to her, not able to access kitchen right now  Toileting _58  _59  _60  _61  _62  Patient not able to access bathroom without assistance  Bathing _63  _64  _65  _66  _67  Patient not able to access bathroom without assistance  Grooming/Hygiene _68  _69  _70  _71  _72  Patient not able to access bathroom without assistance; performs seated  Meal Prep _73  _74  _75  _76  _77   unable to access kitchen currently  IADLS <EAVWUJWJXBJYNWGN>_5<\/AOZHYQMVHQIONGEX>_5  _1  _2  _3  _4  currently unable due to falls in the community  Bowel Management: _5 Continent  _6 Incontinent  _7 Accidents Comments:  if not able to access bathroom quickly enough  Bladder Management: _8 Continent  _9 Incontinent  _10 Accidents Comments:       WHEELCHAIR SKILLS: Manual w/c Propulsion: _11 UE or LE strength and endurance sufficient to participate in ADLs using manual wheelchair Arm : _12 left _13 right   _14 Both      Distance:  Foot:  _15 left _16 right   _17 Both  Operate Scooter: _18  Strength, hand grip, balance and transfer appropriate for  use _19 Living environment is accessible for use of scooter  Operate Power w/c:  _20  Std. Joystick   _21  Alternative Controls Indep _22  Assist _23  Dependent/unable _24  [name] _25   _26 Safe          _27  Functional      Distance: community  Bed confined without wheelchair _28  Yes _29  No   STRENGTH/RANGE OF MOTION:  AROM Range of Motion Strength  Shoulder 110 flexion bilaterally 2+/5 with pain in neck  Elbow WFL 4/5 but pain in neck with resistance  Wrist/Hand WFL 4/5  Hip Limited by weakness and edema 2/5  Knee 65 deg flexion; Limited by weakness and edema 3/5  Ankle Limited by weakness and edema 3/5     MOBILITY/BALANCE:  _30  Patient is totally dependent for mobility      Balance Transfers Ambulation  Sitting Balance: Standing Balance: _31  Independent _32  Independent/Modified Independent  _33  WFL     _34  WFL _35  Supervision _36  Supervision  _37  Uses UE for balance  _38  Supervision _39  Min Assist _40  Ambulates with Assist      _41  Min Assist _42  Min assist _43  Mod Assist _44  Ambulates with Device:      _45  RW  _46  StW  _47  Cane  _48    _49  Mod Assist _50  Mod assist _51  Max assist   _52  Max Assist _53  Max assist _54  Dependent _55  Indep. Short Distance Only  _56  Unable _57  Unable _58  Lift / Sling Required Distance (in feet)     _59  Sliding board _60  Unable to Ambulate (see explanation below)  Cardio Status:  _61 Intact  _62  Impaired   _63  NA       Respiratory Status:  _64 Intact   _65 Impaired   _66 NA     asthma - inhaler  Orthotics/Prosthetics:   Comments (Address manual vs power w/c vs scooter): Catherine Powell has a mobility limitation that significantly impairs safe, timely participation in one or more mobility related ADL's.  Catherine Powell presents with morbid obesity >400 pounds and presents with severe bilateral lower extremity lymphedema and weakness.  Catherine Powell mobility limitation cannot be compensated for with the use of a cane or walker.  Catherine Powell requires +2 assistance to stand safely and is unable to ambulate due to the  lymphedema, weakness, and impaired sensation in her lower extremities.  Catherine Powell has experienced multiple falls in the past 6 months when attempting to transfer; one injury resulted in [place] hitting her head and causing pain in her neck.  Catherine Powell is unable to functionally propel a manual wheelchair due to her body weight, limited upper extremity AROM, upper extremity weakness and impaired sensation due to history of carpal tunnel.  Catherine Powell is also presents with asthma limiting her aerobic endurance to functionally propel a heavy-duty manual wheelchair.  Catherine Powell does not have sufficient strength and standing balance to safely transfer to a power scooter (POV) and lacks sufficient upper extremity  strength and range of motion to drive a tiller style propulsion system with both arms.  A power scooter (POV) would not be able to accommodate Nayali's weight or provide sufficient support and elevation to assist with lymphedema management.  Catherine Powell is currently unable to access her kitchen for meal preparation and requires the assistance of both her husband and son to access her bathroom for toileting, bathing, and hygiene at her sink.  Catherine Powell would benefit from the use of a power wheelchair for independent household mobility, to be able to access her kitchen for meal preparation, to be able to perform MRADLs independently, and to decrease her risk for falls.  The use of power mobility would also allow Catherine Powell to access her workplace and return to full-time work.  Catherine Powell currently has two non-healing wounds on her right lower extremity and is at risk for further wound development due to her lower extremity lymphedema and because she is unable to perform pressure relief.  She currently requires +2 assistance to stand safely for a long period of time.  The use of power tilt would allow Catherine Powell to perform frequent, effective, and prolonged pressure relief and would assist in management of lower extremity edema.          Anterior / Posterior Obliquity Rotation-Pelvis   PELVIS    _0  _1  _2   Neutral Posterior Anterior  _3  _4  _5   WFL Rt elev Lt elev  _6  _7  _8   WFL Right Left                      Anterior    Anterior     _9  Fixed _10  Other _11  Partly Flexible _12  Flexible   _13  Fixed _14  Other _15  Partly Flexible  _16  Flexible  _17  Fixed _18  Other _19  Partly Flexible  _20  Flexible   TRUNK  _21  _22  _23   [facility]  Thoracic  Lumbar  Kyphosis Lordosis  _24  _25  _26   WFL Convex Convex  Right Left _27 c-curve _28 s-curve _29 multiple  _30  Neutral _31  Left-anterior _32  Right-anterior     _33  Fixed _34  Flexible _35  Partly Flexible _36  Other  _37  Fixed _38  Flexible _39  Partly Flexible _40  Other  _41  Fixed             _42  Flexible _43  Partly Flexible _44  Other    Position Windswept    HIPS          _45            _46               _47    Neutral       Abduct        ADduct         _48           _49            _50   Neutral Right           Left      _51  Fixed _52  Subluxed _53  Partly Flexible _54  Dislocated _55  Flexible  _56  Fixed _57  Other _58  Partly Flexible  _59  Flexible                 Foot Positioning Knee Positioning      _60  WFL  _61 Lt _62 Rt _63  WFL  _64 Lt _65 Rt    KNEES ROM concerns: ROM concerns:    & Dorsi-Flexed _66 Lt _67 Rt     FEET Plantar Flexed _68 Lt _69 Rt      Inversion                 _70   Lt _0 Rt      Eversion                 _1 Lt _2 Rt     HEAD _3  Functional _4  Good Head Control    & _5  Flexed         _6  Extended _7  Adequate Head Control    NECK _8  Rotated  Lt  _9  Lat Flexed Lt _10  Rotated  Rt _11  Lat Flexed Rt _12  Limited Head Control     _13  Cervical Hyperextension _14  Absent  Head Control     SHOULDERS ELBOWS WRIST& HAND       Left     Right    Left     Right    Left     Right   U/E _15 Functional           _16 Functional   _17 Fisting             _18 Fisting      _19 elev   _20 dep      _21 elev   _22 dep       _23 pro -_24 retract     _25 pro  _26 retract _27 subluxed             _28 subluxed           Goals for Wheelchair Mobility  _29   Independence with mobility in the home with motor related ADLs (MRADLs)  _30  Independence with MRADLs in the community _31  Provide dependent mobility  _32  Provide recline     _33 Provide tilt   Goals for Seating system _34  Optimize pressure distribution _35  Provide support needed to facilitate function or safety _36  Provide corrective forces to assist with maintaining or improving posture _37  Accommodate client's posture:   current seated postures and positions are not flexible or will not tolerate corrective forces _38  Client to be independent with relieving pressure in the wheelchair _39 Enhance physiological function such as breathing, swallowing, digestion  Simulation ideas/Equipment trials: State why other equipment was unsuccessful:   MOBILITY BASE RECOMMENDATIONS and JUSTIFICATION: MOBILITY COMPONENT JUSTIFICATION  Manufacturer: Model:    Size: Width Seat Depth  _40 provide transport from point A to B      _41 promote Indep mobility  _42 is not a safe, functional ambulator _43 walker or cane inadequate _44 non-standard width/depth necessary to accommodate anatomical measurement _45    _46 Manual Mobility Base _47 non-functional ambulator    _48 Scooter/POV  _49 can safely operate  _50 can safely transfer   _51 has adequate trunk stability  _52 cannot functionally propel manual w/c  _53 Power Mobility Base  _54 non-ambulatory  _55 cannot functionally propel manual wheelchair  _56  cannot functionally and safely operate scooter/POV _57 can safely operate and willing to  _58 Stroller Base _59 infant/child  _60 unable to propel manual wheelchair _61 allows for growth _62 non-functional ambulator _63 non-functional UE _64 Indep mobility is not a goal at this time  _65 Tilt -Tru-Balance Bariatric Tilt _66 Forward _67 Backward _68 Powered tilt  _69 Manual tilt  _70 change position against gravitational force on head and shoulders  _71 change position for pressure relief/cannot weight shift _72 transfers  _73 management of tone _74 rest  periods _75 control edema _76 facilitate postural control  _77    _78 Recline  _79 Power recline on power base _80 Manual recline on manual base  _81 accommodate femur to back angle  _82 bring to full recline for ADL care  _83 change position for pressure relief/cannot weight shift _84 rest periods _85 repositioning for transfers or clothing/diaper /catheter changes _86 head positioning  _87 Lighter weight required _88 self- propulsion  _89 lifting _90    _91 Heavy Duty required _92 user weight greater than 250# _93 extreme tone/ over active movement _94 broken frame on previous chair _95    _96   Back  _0  Angle Adjustable _1  Custom molded  _2 postural control _3 control of tone/spasticity _4 accommodation of range of motion _5 UE functional control _6 accommodation for seating system _7   _8 provide lateral trunk support _9 accommodate deformity _10 provide posterior trunk support _11 provide lumbar/sacral support _12 support trunk in midline _13 Pressure relief over spinal processes  _14  Seat Cushion  _15 impaired sensation  _16 decubitus ulcers present _17 history of pressure ulceration _18 prevent pelvic extension _19 low maintenance  _20 stabilize pelvis  _21 accommodate obliquity _22 accommodate multiple deformity _23 neutralize lower extremity position _24 increase pressure distribution _25    _26  Pelvic/thigh support  _27  Lateral thigh guide _28  Distal medial pad  _29  Distal lateral pad _30  pelvis in neutral _31 accommodate pelvis _32  position upper legs _33  alignment _34  accommodate ROM _35  decr adduction _36 accommodate tone _37 removable for transfers _38 decr abduction  _39  Lateral trunk Supports _40  Lt     _41  Rt _42 decrease lateral trunk leaning _43 control tone _44 contour for increased contact _45 safety  _46 accommodate asymmetry _47    _48  Mounting hardware  _49 lateral trunk supports  _50 back   _51 seat _52 headrest      _53  thigh support _54 fixed   _55 swing away _56 attach seat platform/cushion to w/c frame _57 attach back cushion to w/c frame _58 mount  postural supports _59 mount headrest  _60 swing medial thigh support away _61 swing lateral supports away for transfers  _62      Armrests  _63 fixed _64 adjustable height _65 removable   _66 swing away  _67 flip back   _68 reclining _69 full length pads _70 desk    _71 pads tubular  _72 provide support with elbow at 90   _73 provide support for w/c tray _74 change of height/angles for variable activities _75 remove for transfers _76 allow to come closer to table top _77 remove for access to tables _78    Hangers/ Leg rests  _79 60 _80 70 _81 90 _82 elevating _83 heavy duty  _84 articulating _85 fixed _86 lift off _87 swing away     _88 power _89 provide LE support  _90 accommodate to hamstring tightness _91 elevate legs during recline   _92 provide change in position for Legs _93 Maintain placement of feet on footplate _94 durability _95 enable transfers _96 decrease edema _97 Accommodate lower leg length _98    Foot support Footplate    <ZOXWRUEAVWUJWJXB>_1<\/YNWGNFAOZHYQMVHQ>_46 Lt  _100  Rt  _101  Powell mount _102 flip up     _103 depth/angle adjustable _104 Amputee adapter    _105  Lt     _106  Rt _107 provide foot support _108 accommodate to ankle ROM _109 transfers _110 Provide support for residual extremity _111  allow foot to go under wheelchair base _112  decrease tone  _113    _114  Ankle strap/heel loops _115 support foot on foot support _116 decrease extraneous movement _117 provide input to heel  _118 protect foot  Tires: _119 pneumatic  _120 flat free inserts  _121 solid  _122 decrease maintenance  _123 prevent frequent flats _124 increase shock absorbency _125 decrease pain from road shock _126 decrease spasms from road shock _127    _128  Headrest  _129 provide posterior head support _130 provide posterior neck support _131 provide lateral head support _132 provide anterior head support _133 support during tilt and recline _134 improve feeding   _135 improve respiration _136 placement of switches _137 safety  _138 accommodate ROM  _139 accommodate tone _140 improve visual orientation  _141  Anterior chest strap _142  Vest _143  Shoulder retractors  _144 decrease forward movement  of shoulder _145 accommodation of TLSO _146 decrease forward movement of trunk _147 decrease shoulder elevation _148 added abdominal support _149 alignment _150 assistance with shoulder control  _151    Pelvic Positioner _152 Belt _153 SubASIS bar _154 Dual Pull _155 stabilize tone _156 decrease falling out of chair/ **will not Decr potential for sliding due to pelvic tilting _157 prevent excessive rotation _158 pad for protection over boney prominence _159 prominence comfort _160 special pull angle to control rotation _161    Upper Extremity Support _162 L   _163  R _164 Arm trough    _165   hand support _0  tray       _1 full tray _2 swivel mount _3 decrease edema      _4 decrease subluxation   _5 control tone   _6 placement for AAC/Computer/EADL _7 decrease gravitational pull on shoulders _8 provide midline positioning _9 provide support to increase UE function _10 provide hand support in natural position _11 provide work surface   POWER WHEELCHAIR CONTROLS  _12 Proportional  _13 Non-Proportional Type  _14 Left  _15 Right _16 provides access for controlling wheelchair   _17 lacks motor control to operate proportional drive control <NFAOZHYQMVHQIONG>_2<\/XBMWUXLKGMWNUUVO>_53 unable to understand proportional controls  Actuator Control Module  _19 Single  _20 Multiple   _21 Allow the client to operate the power seat function(s) through the joystick control   _22 Safety Reset Switches _23 Used to change modes and stop the wheelchair when driving in latch mode    _24 [employer]   _25 programming for accurate control _26 progressive Disease/changing condition _27 non-proportional drive control needed _28 Needed in order to operate power seat functions through joystick control   _29 Display box _30 Allows user to see in which mode and drive the wheelchair is set  _31 necessary for alternate controls    _32 Digital interface electronics _33 Allows w/c to operate when using alternative drive controls  <GUYQIHKVQQVZDGLO>_7<\/FIEPPIRJJOACZYSA>_63 ASL Head Array _35 Allows client to operate wheelchair  through switches placed in tri-panel headrest  _36 Sip and  puff with tubing kit _37 needed to operate sip and puff drive controls  <KZSWFUXNATFTDDUK>_0<\/URKYHCWCBJSEGBTD>_17 Upgraded tracking electronics _39 increase safety when driving <OHYWVPXTGGYIRSWN>_4<\/OEVOJJKKXFGHWEXH>_37 correct tracking when on uneven surfaces  _41 Mount for switches or joystick _42 Attaches switches to w/c  _43 Swing away for access or transfers _44 midline for optimal placement _45 provides for consistent access  _46 Attendant controlled joystick plus mount _47 safety _48 long distance driving <JIRCVELFYBOFBPZW>_2<\/HENIDPOEUMPNTIRW>_43 operation of seat functions _50 compliance with transportation regulations _51      Rear wheel placement/Axle adjustability _52 None _53 semi adjustable _54 fully adjustable  _55 improved UE access to wheels _56 improved stability _57 changing angle in space for improvement of postural stability _58 1-arm drive access <XVQMGQQPYPPJKDTO>_6<\/ZTIWPYKDXIPJASNK>_53 amputee pad placement _60    Wheel rims/ hand rims  _61 metal  _62 plastic coated _63 oblique projections _64 vertical projections _65 Provide ability to propel manual wheelchair  _66  Increase self-propulsion with hand weakness/decreased grasp  Push handles _67 extended  _68 angle adjustable  _69 standard _70 caregiver access _71 caregiver assist _72 allows hooking to enable increased ability to perform ADLs or maintain balance  One armed device  _73 Lt   _74 Rt _75 enable propulsion of manual wheelchair with one arm   _76      Brake/wheel lock extension _77  Lt   _78  Rt _79 increase indep in applying wheel locks   _80 Side guards _81 prevent clothing getting caught in wheel or becoming soiled _82  prevent skin tears/abrasions  Battery:  _83 to power wheelchair   Other:     The above equipment has a life- long use expectancy. Growth and changes in medical and/or functional conditions would be the exceptions. This is to certify that the therapist has no financial relationship with durable medical provider or manufacturer. The therapist will not receive remuneration of any kind for the equipment recommended in this evaluation.   Patient has mobility limitation that significantly impairs safe, timely participation in  one or more mobility related ADL's.  (bathing, toileting, feeding, dressing, grooming, moving from room to room)                                                             _84  Yes _85  No Will mobility device sufficiently improve  ability to participate and/or be aided in participation of MRADL's?         _0  Yes _1  No Can limitation be compensated for with use of a cane or walker?                                                                                _2  Yes _3  No Does patient or caregiver demonstrate ability/potential ability & willingness to safely use the mobility device?   _4  Yes _5  No Does patient's home environment support use of recommended mobility device?                                                    _6  Yes _7  No Does patient have sufficient upper extremity function necessary to functionally propel a manual wheelchair?    _8  Yes _9  No Does patient have sufficient strength and trunk stability to safely operate a POV (scooter)?                                  _10  Yes _11  No Does patient need additional features/benefits provided by a power wheelchair for MRADL's in the home?       _12  Yes _13  No Does the patient demonstrate the ability to safely use a power wheelchair?                                                              _14  Yes _15  No  Therapist Name Printed:  Date:   Therapist's Signature:   Date:   Supplier's Name Printed:  Date:   Supplier's Signature:   Date:  Patient/Caregiver Signature:   Date:     This is to certify that I have read this evaluation and do agree with the content within:   49 Name Printed:   31 Signature:  Date:     This is to certify that I, the above signed therapist have the following affiliations: _16  This DME provider _17  Manufacturer of recommended equipment _18  Patient's long term care facility _19  None of the above      Objective measurements completed on examination: See above findings.      PT  Education - 04/09/21 2044     Education Details power mobility recommendations; process for obtaining power wheelchair    Person(s) Educated Patient    Methods Explanation    Comprehension Verbalized understanding                         Plan - 04/09/21 2045     Clinical Impression Statement Pt is a 56 year old female referred to Neuro [facility] for evaluation for power mobility.  Pt's PMH is significant for the following: multiple falls, asthma, Carpal tunnel syndrome,  DJD of bilat knees, depression, HTN, GERD, heart murmur due to rheumatic fever, lymphedema associated with obesity, morbid obesity, R knee meniscal tear with R arthroscopy, cervical spine fusion. The following deficits were noted during pt's exam: impaired sensation in bilat UE and LE, neck pain, hip and knee pain bilaterally, impaired UE ROM and strength bilaterally, edema and impaired skin integrity in bilat LE, impaired LE ROM and strength bilaterally, impaired posture, impaired standing balance, difficulty walking, and impaired aerobic endurance and activity tolerance.  Pt is currently unable to ambulate, requires +2 assistance for transfers and MRADLs, and is unable to work due to mobility limitations.  Patient would benefit from a power wheelchair with power tilt and skin protection cushion to maximize functional mobility independence and reduce falls risk in her home and community environments.    Personal Factors and Comorbidities Comorbidity 3+;Fitness;Past/Current Experience;Profession;Transportation    Comorbidities multiple falls, asthma, Carpal tunnel syndrome, DJD of bilat knees, depression, HTN, GERD, heart murmur due to rheumatic fever, lymphedema associated with obesity, morbid obesity, R knee meniscal tear with R arthroscopy, cervical spine fusion    Examination-Activity Limitations Bathing;Carry;Dressing;Hygiene/Grooming;Locomotion Level;Stand;Transfers;Toileting    Examination-Participation Restrictions  Cleaning;Community Activity;Driving;Laundry;Meal Prep;Occupation    Stability/Clinical Decision Making Evolving/Moderate complexity    Clinical Decision Making Moderate    Rehab Potential Good    PT Frequency One time visit    PT Duration Other (comment)   one visit for wheelchair evaluation only            Patient will benefit from skilled therapeutic intervention in order to improve the following deficits and impairments:  Cardiopulmonary status limiting activity, Decreased activity tolerance, Decreased balance, Decreased endurance, Decreased mobility, Decreased strength, Decreased skin integrity, Decreased range of motion, Difficulty walking, Increased edema, Impaired sensation, Postural dysfunction, Pain, Obesity  Visit Diagnosis: Repeated falls  Difficulty in walking, not elsewhere classified  Unsteadiness on feet  Muscle weakness (generalized)  Other disturbances of skin sensation  Abnormal posture  Lymphedema, not elsewhere classified  Cervicalgia     Problem List Patient Active Problem List   Diagnosis Date Noted   Venous stasis ulcer ([place]) 04/03/2021   COVID 03/02/2021   Cellulitis of right lower extremity 01/22/2021   Onychomycosis 11/12/2020   Obesity, Class III, BMI 40-49.9 (morbid obesity) ([place]) 09/21/2020   Fall (on)(from) sidewalk curb, initial encounter 02/24/2020   Dizziness 02/13/2020   Chest wall pain 09/25/2019   Acute cystitis without hematuria 04/02/2019   Shortness of breath 04/02/2019   Otitis media 10/06/2018   Dry eyes, bilateral 10/06/2018   Otitis, externa, infective 06/12/2018   Acute right-sided low back pain without sciatica 09/03/2017   Asthma, chronic, mild persistent, uncomplicated 73/22/0254   Lymphedema 04/29/2017   Candidiasis, intertrigo 12/05/2016   Acute medial meniscus tear of right knee 06/15/2016   Chronic venous insufficiency 06/27/2015   Vitamin D deficiency 05/11/2014   Encounter for chronic pain management  05/11/2014   Intertrigo 06/30/2013   Degenerative joint disease of knees 05/01/2010   Cervical radiculopathy 08/06/2007   Migraine 11/19/2006   Essential hypertension 05/22/2006   HYPERCHOLESTEROLEMIA 05/08/2006   Allergic rhinitis 05/08/2006   GASTROESOPHAGEAL REFLUX, NO ESOPHAGITIS 05/08/2006    Rico Junker, PT 04/09/2021, 8:54 PM  [facility] [address] [place], [place], [address] Phone: [phone]   Fax:  [phone]  Name: Catherine Powell MRN: [id number] Date of Birth: 1965-09-11

## 2021-04-18 ENCOUNTER — Encounter: Payer: Self-pay | Admitting: Family Medicine

## 2021-04-19 NOTE — Progress Notes (Addendum)
Catherine Powell, Catherine H. ([id number]) Visit Report for 04/20/2021 Allergy List Details Patient Name: Date of Service: Catherine FlakesBRIGMA Powell, Catherine ERLY H. 04/20/2021 7:30 A M Medical Record Number: [id number] Patient Account Number: [id number] Date of Birth/Sex: Treating RN: 07/24/1965 (56 y.o. Catherine Powell) Powell, Catherine Primary Care Miette Molenda: [facility], [place] Other Clinician: Referring Saydi Kobel: Treating Arabia Nylund/Extender: Augustine RadarHoffman, Jessica Brown, Carina Weeks in Treatment: 0 Allergies Active Allergies tolmetin sodium Reaction: Shortness of breath Allergy Notes Electronic Signature(s) Signed: 04/19/2021 10:39:06 AM By: Karl BalesGrant, Charlie EMT Entered By: Karl BalesGrant, Charlie on 04/19/2021 10:39:05 -------------------------------------------------------------------------------- Arrival Information Details Patient Name: Date of Service: Catherine FlakesBRIGMA Powell, Catherine ERLY H. 04/20/2021 7:30 A M Medical Record Number: [id number] Patient Account Number: [id number] Date of Birth/Sex: Treating RN: 11/27/1965 (56 y.o. Catherine Powell) Powell, Catherine [facility] [place] Other Clinician: Referring [place]: Treating Vanisha Whiten/Extender: Cathleen FearsHoffman, Jessica Brown, Carina Weeks in Treatment: 0 Visit Information Patient Arrived: Wheel Chair Arrival Time: 07:47 Accompanied By: son Transfer Assistance: Manual Patient Identification Verified: Yes Secondary Verification Process Completed: Yes Patient Requires Transmission-Based Precautions: No Patient Has Alerts: No Electronic Signature(s) Signed: 04/20/2021 2:08:26 PM By: Zandra AbtsLynch, Shatara RN, BSN Entered By: Zandra AbtsLynch, Catherine on 04/20/2021 07:48:49 -------------------------------------------------------------------------------- Clinic Level of Care Assessment Details Patient Name: Date of Service: Catherine Powell, Catherine ERLY H. 04/20/2021 7:30 A M Medical Record Number: [id number] Patient Account Number: [id number] Date of Birth/Sex: Treating RN: 01/27/1966 (56 y.o. Catherine Powell) Powell, Catherine Primary Care Elantra Caprara[facility]  [place] Other Clinician: Referring Torez Beauregard: Treating Hortense Cantrall/Extender: Augustine RadarHoffman, Jessica Brown, Carina Weeks in Treatment: 0 Clinic Level of Care Assessment Items TOOL 1 Quantity Score X- 1 0 Use when EandM and Procedure is performed on INITIAL visit ASSESSMENTS - Nursing Assessment / Reassessment X- 1 20 General Physical Exam (combine w/ comprehensive assessment (listed just below) when performed on new pt. evals) X- 1 25 Comprehensive Assessment (HX, ROS, Risk Assessments, Wounds Hx, etc.) ASSESSMENTS - Wound and Skin Assessment / Reassessment []  - 0 Dermatologic / Skin Assessment (not related to wound area) ASSESSMENTS - Ostomy and/or Continence Assessment and Care []  - 0 Incontinence Assessment and Management []  - 0 Ostomy Care Assessment and Management (repouching, etc.) PROCESS - Coordination of Care X - Simple Patient / Family Education for ongoing care 1 15 []  - 0 Complex (extensive) Patient / Family Education for ongoing care X- 1 10 Staff obtains ChiropractorConsents, Records, T Results / Process Orders est []  - 0 Staff telephones HHA, Nursing Homes / Clarify orders / etc []  - 0 Routine Transfer to another Facility (non-emergent condition) []  - 0 Routine Hospital Admission (non-emergent condition) X- 1 15 New Admissions / Manufacturing engineernsurance Authorizations / Ordering NPWT Apligraf, etc. , []  - 0 Emergency Hospital Admission (emergent condition) PROCESS - Special Needs []  - 0 Pediatric / Minor Patient Management []  - 0 Isolation Patient Management []  - 0 Hearing / Language / Visual special needs []  - 0 Assessment of Community assistance (transportation, D/C planning, etc.) []  - 0 Additional assistance / Altered mentation []  - 0 Support Surface(s) Assessment (bed, cushion, seat, etc.) INTERVENTIONS - Miscellaneous []  - 0 External ear exam []  - 0 Patient Transfer (multiple staff / Nurse, adultHoyer Lift / Similar devices) []  - 0 Simple Staple / Suture removal (25 or less) []  -  0 Complex Staple / Suture removal (26 or more) []  - 0 Hypo/Hyperglycemic Management (do not check if billed separately) []  - 0 Ankle / Brachial Index (ABI) - do not check if billed separately Has the patient been seen at the hospital  within the last three years: Yes Total Score: 85 Level Of Care: New/Established - Level 3 Electronic Signature(s) Signed: 04/20/2021 2:08:26 PM By: Zandra Abts RN, BSN Entered By: Zandra Abts on 04/20/2021 08:40:15 -------------------------------------------------------------------------------- Compression Therapy Details Patient Name: Date of Service: Catherine Powell. 04/20/2021 7:30 A M Medical Record Number: [id number] Patient Account Number: [id number] Date of Birth/Sex: Treating RN: 1965-08-04 (56 y.o. Catherine Link Primary Care Catherine Powell: [facility], [place] Other Clinician: Referring Donita Newland: Treating Catherine Powell/Extender: Augustine Radar, Carina Weeks in Treatment: 0 Compression Therapy Performed for Wound Assessment: Wound #1 Right,Posterior Lower Leg Performed By: Clinician Zandra Abts, RN Compression Type: Three Layer Post Procedure Diagnosis Same as Pre-procedure Electronic Signature(s) Signed: 04/20/2021 2:08:26 PM By: Zandra Abts RN, BSN Entered By: Zandra Abts on 04/20/2021 08:37:03 -------------------------------------------------------------------------------- Encounter Discharge Information Details Patient Name: Date of Service: Catherine Powell. 04/20/2021 7:30 A M Medical Record Number: [id number] Patient Account Number: [id number] Date of Birth/Sex: Treating RN: 04/27/1965 (56 y.o. Catherine Link Primary Care Catherine Powell: [facility], [place] Other Clinician: Referring Danay Mckellar: Treating Glennda Weatherholtz/Extender: Cathleen Fears in Treatment: 0 Encounter Discharge Information Items Discharge Condition: Stable Ambulatory Status: Wheelchair Discharge Destination: Home Transportation:  Private Auto Accompanied By: son Schedule Follow-up Appointment: Yes Clinical Summary of Care: Patient Declined Electronic Signature(s) Signed: 04/20/2021 2:08:26 PM By: Zandra Abts RN, BSN Entered By: Zandra Abts on 04/20/2021 12:54:51 -------------------------------------------------------------------------------- Lower Extremity Assessment Details Patient Name: Date of Service: Catherine Powell. 04/20/2021 7:30 A M Medical Record Number: [id number] Patient Account Number: [id number] Date of Birth/Sex: Treating RN: 1965/04/09 (57 y.o. Catherine Link Primary Care Kecia Swoboda: [facility], [place] Other Clinician: Referring Solae Norling: Treating Zakia Sainato/Extender: Augustine Radar, Carina Weeks in Treatment: 0 Edema Assessment Assessed: [Left: No] [Right: No] Edema: [Left: Ye] [Right: s] Calf Left: Right: Point of Measurement: From Medial Instep 78 cm Ankle Left: Right: Point of Measurement: From Medial Instep 39 cm Vascular Assessment Pulses: Dorsalis Pedis Palpable: [Right:No] Electronic Signature(s) Signed: 04/20/2021 2:08:26 PM By: Zandra Abts RN, BSN Entered By: Zandra Abts on 04/20/2021 07:59:14 -------------------------------------------------------------------------------- Multi Wound Chart Details Patient Name: Date of Service: Catherine Powell. 04/20/2021 7:30 A M Medical Record Number: [id number] Patient Account Number: [id number] Date of Birth/Sex: Treating RN: 05/10/1965 (56 y.o. Catherine Standard Primary Care De Jaworski: [facility], [place] [facility] Other Clinician: Referring Diquan Kassis: Treating Devri Kreher/Extender: Augustine Radar, Carina Weeks in Treatment: 0 Vital Signs Height(in): 68 Pulse(bpm): 85 Weight(lbs): 430 Blood Pressure(mmHg): 161/73 Body Mass Index(BMI): 65.4 Temperature(F): 98.5 Respiratory Rate(breaths/min): 20 Photos: [Powell/A:Powell/A] Right, Posterior Lower Leg Powell/A Powell/A Wound Location: Gradually Appeared Powell/A Powell/A Wounding  Event: Lymphedema Powell/A Powell/A Primary Etiology: Lymphedema, Asthma, Hypertension, Powell/A Powell/A Comorbid History: Peripheral Venous Disease, Osteoarthritis 01/07/2021 Powell/A Powell/A Date Acquired: 0 Powell/A Powell/A Weeks of Treatment: Open Powell/A Powell/A Wound Status: No Powell/A Powell/A Wound Recurrence: 3.9x1.4x0.1 Powell/A Powell/A Measurements L x W x D (cm) 4.288 Powell/A Powell/A A (cm) : rea 0.429 Powell/A Powell/A Volume (cm) : Full Thickness Without Exposed Powell/A Powell/A Classification: Support Structures Medium Powell/A Powell/A Exudate Amount: Serous Powell/A Powell/A Exudate Type: amber Powell/A Powell/A Exudate Color: Flat and Intact Powell/A Powell/A Wound Margin: Medium (34-66%) Powell/A Powell/A Granulation Amount: Pink Powell/A Powell/A Granulation Quality: Medium (34-66%) Powell/A Powell/A Necrotic Amount: Fat Layer (Subcutaneous Tissue): Yes Powell/A Powell/A Exposed Structures: Fascia: No Tendon: No Muscle: No Joint: No Bone: No Small (1-33%) Powell/A Powell/A Epithelialization: Compression Therapy Powell/A Powell/A Procedures Performed: Treatment Notes Electronic Signature(s)  Signed: 04/20/2021 9:12:16 AM By: Geralyn Corwin DO Signed: 04/20/2021 12:55:08 PM By: Zenaida Deed RN, BSN Entered By: Geralyn Corwin on 04/20/2021 09:02:31 -------------------------------------------------------------------------------- Multi-Disciplinary Care Plan Details Patient Name: Date of Service: Catherine Hock H. 04/20/2021 7:30 A M Medical Record Number: [id number] Patient Account Number: [id number] Date of Birth/Sex: Treating RN: [birth year] (56 y.o. Catherine Link Primary Care Zebulun Deman: [facility], [place] Other Clinician: Referring Hawkins Seaman: Treating Rayana Geurin/Extender: Mylinda Latina Weeks in Treatment: 0 Multidisciplinary Care Plan reviewed with physician Active Inactive Abuse / Safety / Falls / Self Care Management Nursing Diagnoses: History of Falls Potential for injury related to falls Goals: Patient will not experience any injury related to falls Date Initiated: 04/20/2021 Target  Resolution Date: 05/18/2021 Goal Status: Active Patient/caregiver will verbalize/demonstrate measures taken to prevent injury and/or falls Date Initiated: 04/20/2021 Target Resolution Date: 05/18/2021 Goal Status: Active Interventions: Assess Activities of Daily Living upon admission and as needed Assess fall risk on admission and as needed Assess: immobility, friction, shearing, incontinence upon admission and as needed Assess impairment of mobility on admission and as needed per policy Assess personal safety and home safety (as indicated) on admission and as needed Assess self care needs on admission and as needed Provide education on fall prevention Provide education on personal and home safety Notes: Venous Leg Ulcer Nursing Diagnoses: Actual venous Insuffiency (use after diagnosis is confirmed) Goals: Patient will maintain optimal edema control Date Initiated: 04/20/2021 Target Resolution Date: 05/18/2021 Goal Status: Active Interventions: Assess peripheral edema status every visit. Compression as ordered Provide education on venous insufficiency Notes: Wound/Skin Impairment Nursing Diagnoses: Impaired tissue integrity Knowledge deficit related to ulceration/compromised skin integrity Goals: Patient/caregiver will verbalize understanding of skin care regimen Date Initiated: 04/20/2021 Target Resolution Date: 05/18/2021 Goal Status: Active Interventions: Assess patient/caregiver ability to obtain necessary supplies Assess patient/caregiver ability to perform ulcer/skin care regimen upon admission and as needed Assess ulceration(s) every visit Provide education on ulcer and skin care Notes: Electronic Signature(s) Signed: 04/20/2021 2:08:26 PM By: Zandra Abts RN, BSN Entered By: Zandra Abts on 04/20/2021 08:33:11 -------------------------------------------------------------------------------- Pain Assessment Details Patient Name: Date of Service: Catherine Powell. 04/20/2021 7:30 A M Medical Record Number: [id number] Patient Account Number: [id number] Date of Birth/Sex: Treating RN: [birth year] (56 y.o. Catherine Link Primary Care Zale Marcotte: [facility], [place] Other Clinician: Referring Zamirah Denny: Treating Idil Maslanka/Extender: Augustine Radar, Carina Weeks in Treatment: 0 Active Problems Location of Pain Severity and Description of Pain Patient Has Paino Yes Site Locations Pain Location: Pain in Ulcers With Dressing Change: Yes Duration of the Pain. Constant / Intermittento Intermittent Rate the pain. Current Pain Level: 6 Worst Pain Level: 10 Least Pain Level: 0 Character of Pain Describe the Pain: Stabbing Pain Management and Medication Current Pain Management: Medication: Yes Cold Application: No Rest: No Massage: No Activity: No T.E.Powell.S.: No Heat Application: No Leg drop or elevation: No Is the Current Pain Management Adequate: Adequate How does your wound impact your activities of daily livingo Sleep: No Bathing: No Appetite: No Relationship With Others: No Bladder Continence: No Emotions: No Bowel Continence: No Work: No Toileting: No Drive: No Dressing: No Hobbies: No Electronic Signature(s) Signed: 04/20/2021 2:08:26 PM By: Zandra Abts RN, BSN Entered By: Zandra Abts on 04/20/2021 08:00:00 -------------------------------------------------------------------------------- Patient/Caregiver Education Details Patient Name: Date of Service: Catherine Powell 2/10/2023andnbsp7:30 A M Medical Record Number: [id number] Patient Account Number: [id number] Date of Birth/Gender: Treating RN: 1965/05/21 (56 y.o. Catherine Link Primary Care  Physician: [facility], [place] Other Clinician: Referring Physician: Treating Physician/Extender: Cathleen Fears in Treatment: 0 Education Assessment Education Provided To: Patient Education Topics Provided Safety: Methods:  Explain/Verbal Responses: State content correctly Venous: Methods: Explain/Verbal Responses: State content correctly Wound/Skin Impairment: Methods: Explain/Verbal Responses: State content correctly Electronic Signature(s) Signed: 04/20/2021 2:08:26 PM By: Zandra Abts RN, BSN Entered By: Zandra Abts on 04/20/2021 08:33:35 -------------------------------------------------------------------------------- Wound Assessment Details Patient Name: Date of Service: Catherine Powell. 04/20/2021 7:30 A M Medical Record Number: [id number] Patient Account Number: [id number] Date of Birth/Sex: Treating RN: 05-03-1965 (57 y.o. Catherine Link Primary Care Courteny Egler: [facility], [place] Other Clinician: Referring Khoa Opdahl: Treating Lesslie Mossa/Extender: Augustine Radar, Carina Weeks in Treatment: 0 Wound Status Wound Number: 1 Primary Lymphedema Etiology: Wound Location: Right, Posterior Lower Leg Wound Open Wounding Event: Gradually Appeared Status: Date Acquired: 01/07/2021 Comorbid Lymphedema, Asthma, Hypertension, Peripheral Venous Weeks Of Treatment: 0 History: Disease, Osteoarthritis Clustered Wound: No Photos Wound Measurements Length: (cm) 3.9 Width: (cm) 1.4 Depth: (cm) 0.1 Area: (cm) 4.288 Volume: (cm) 0.429 % Reduction in Area: % Reduction in Volume: Epithelialization: Small (1-33%) Tunneling: No Undermining: No Wound Description Classification: Full Thickness Without Exposed Support Structures Wound Margin: Flat and Intact Exudate Amount: Medium Exudate Type: Serous Exudate Color: amber Foul Odor After Cleansing: No Slough/Fibrino Yes Wound Bed Granulation Amount: Medium (34-66%) Exposed Structure Granulation Quality: Pink Fascia Exposed: No Necrotic Amount: Medium (34-66%) Fat Layer (Subcutaneous Tissue) Exposed: Yes Necrotic Quality: Adherent Slough Tendon Exposed: No Muscle Exposed: No Joint Exposed: No Bone Exposed: No Treatment  Notes Wound #1 (Lower Leg) Wound Laterality: Right, Posterior Cleanser Soap and Water Discharge Instruction: May shower and wash wound with dial antibacterial soap and water prior to dressing change. Wound Cleanser Discharge Instruction: Cleanse the wound with wound cleanser prior to applying a clean dressing using gauze sponges, not tissue or cotton balls. Peri-Wound Care Sween Lotion (Moisturizing lotion) Discharge Instruction: Apply moisturizing lotion as directed Topical Primary Dressing Hydrofera Blue Ready Foam, 2.5 x2.5 in Discharge Instruction: Apply to wound bed as instructed Secondary Dressing Woven Gauze Sponge, Non-Sterile 4x4 in Discharge Instruction: Apply over primary dressing as directed. ABD Pad, 8x10 Discharge Instruction: Apply over primary dressing as directed. Secured With Compression Wrap ThreePress (3 layer compression wrap) Discharge Instruction: Apply three layer compression as directed. Compression Stockings Add-Ons Electronic Signature(s) Signed: 04/20/2021 2:08:26 PM By: Zandra Abts RN, BSN Entered By: Zandra Abts on 04/20/2021 07:58:22 -------------------------------------------------------------------------------- Vitals Details Patient Name: Date of Service: Catherine Powell. 04/20/2021 7:30 A M Medical Record Number: [id number] Patient Account Number: [id number] Date of Birth/Sex: Treating RN: Aug 29, 1965 (56 y.o. Catherine Link Primary Care Monigue Spraggins: [facility], [place] [facility] Other Clinician: Referring Zacharey Jensen: Treating Klani Caridi/Extender: Augustine Radar, Carina Weeks in Treatment: 0 Vital Signs Time Taken: 07:47 Temperature (F): 98.5 Height (in): 68 Pulse (bpm): 85 Source: Stated Respiratory Rate (breaths/min): 20 Weight (lbs): 430 Blood Pressure (mmHg): 161/73 Source: Stated Reference Range: 80 - 120 mg / dl Body Mass Index (BMI): 65.4 Electronic Signature(s) Signed: 04/20/2021 2:08:26 PM By: Zandra Abts RN,  BSN Entered By: Zandra Abts on 04/20/2021 07:52:22

## 2021-04-20 ENCOUNTER — Encounter (HOSPITAL_BASED_OUTPATIENT_CLINIC_OR_DEPARTMENT_OTHER): Payer: BC Managed Care – PPO | Attending: Physician Assistant | Admitting: Internal Medicine

## 2021-04-20 ENCOUNTER — Encounter: Payer: Self-pay | Admitting: Family Medicine

## 2021-04-20 ENCOUNTER — Other Ambulatory Visit: Payer: Self-pay

## 2021-04-20 DIAGNOSIS — I10 Essential (primary) hypertension: Secondary | ICD-10-CM | POA: Insufficient documentation

## 2021-04-20 DIAGNOSIS — I890 Lymphedema, not elsewhere classified: Secondary | ICD-10-CM

## 2021-04-20 DIAGNOSIS — L97812 Non-pressure chronic ulcer of other part of right lower leg with fat layer exposed: Secondary | ICD-10-CM | POA: Diagnosis not present

## 2021-04-20 DIAGNOSIS — B3731 Acute candidiasis of vulva and vagina: Secondary | ICD-10-CM | POA: Diagnosis not present

## 2021-04-20 DIAGNOSIS — M170 Bilateral primary osteoarthritis of knee: Secondary | ICD-10-CM | POA: Diagnosis not present

## 2021-04-20 DIAGNOSIS — E7800 Pure hypercholesterolemia, unspecified: Secondary | ICD-10-CM | POA: Insufficient documentation

## 2021-04-20 DIAGNOSIS — J45909 Unspecified asthma, uncomplicated: Secondary | ICD-10-CM | POA: Diagnosis not present

## 2021-04-20 DIAGNOSIS — Z6841 Body Mass Index (BMI) 40.0 and over, adult: Secondary | ICD-10-CM | POA: Insufficient documentation

## 2021-04-20 DIAGNOSIS — K219 Gastro-esophageal reflux disease without esophagitis: Secondary | ICD-10-CM | POA: Diagnosis not present

## 2021-04-20 DIAGNOSIS — Z993 Dependence on wheelchair: Secondary | ICD-10-CM

## 2021-04-20 DIAGNOSIS — I739 Peripheral vascular disease, unspecified: Secondary | ICD-10-CM | POA: Insufficient documentation

## 2021-04-20 NOTE — Progress Notes (Signed)
PERSAIS, TOTMAN ([id number]) Visit Report for 04/20/2021 Chief Complaint Document Details Patient Name: Date of Service: Catherine Powell 04/20/2021 7:30 A M Medical Record Number: [id number] Patient Account Number: [id number] Date of Birth/Sex: Catherine Powell: 30-Dec-1965 (56 y.o. Elam Dutch Primary Care Provider: [facility], [place] Other Clinician: Referring Provider: Treating Provider/Extender: Catherine Powell, Catherine Powell in Treatment: 0 Information Obtained from: Patient Chief Complaint Right posterior leg wound Electronic Signature(s) Signed: 04/20/2021 9:12:16 AM By: Catherine Shan DO Entered By: Catherine Powell on 04/20/2021 09:02:45 -------------------------------------------------------------------------------- HPI Details Patient Name: Date of Service: Catherine Powell. 04/20/2021 7:30 A M Medical Record Number: [id number] Patient Account Number: [id number] Date of Birth/Sex: Catherine Powell: [birth year] (56 y.o. Elam Dutch Primary Care Provider: [facility], [place] Other Clinician: Referring Provider: Treating Provider/Extender: Quillian Quince Powell in Treatment: 0 History of Present Illness HPI Description: Admission 04/20/2021 Catherine Powell is a 56 year old female with a past medical history of morbid obesity with BMI of 66 and wheelchair dependent due to this, lymphedema and hypertension that presents to the clinic for a 55-month history of nonhealing ulcer to the right calf. She states this started spontaneously and has been using Unna boots and Xeroform with benefit She saw vein and vascular who recommended bariatric surgery. She currently denies systemic signs of infection. Electronic Signature(s) Signed: 04/20/2021 9:12:16 AM By: Catherine Shan DO Entered By: Catherine Powell on 04/20/2021 09:06:24 -------------------------------------------------------------------------------- Physical Exam Details Patient Name: Date of  Service: Catherine Powell. 04/20/2021 7:30 A M Medical Record Number: [id number] Patient Account Number: [id number] Date of Birth/Sex: Catherine Powell: 06-Oct-1965 (57 y.o. Elam Dutch Primary Care Provider: [facility] [place], [place] Other Clinician: Referring Provider: Treating Provider/Extender: Catherine Powell, Catherine Powell in Treatment: 0 Constitutional respirations regular, non-labored and within target range for patient.. Cardiovascular 2+ dorsalis pedis/posterior tibialis pulses. Psychiatric pleasant and cooperative. Notes Right lower extremity: T the posterior aspect there is an open wound with granulation tissue and slough that was easily removed with gauze. No signs of o surrounding infection. Lymphedema skin changes. Nonpitting edema to the knee Electronic Signature(s) Signed: 04/20/2021 9:12:16 AM By: Catherine Shan DO Entered By: Catherine Powell on 04/20/2021 09:07:10 -------------------------------------------------------------------------------- Physician Orders Details Patient Name: Date of Service: Catherine Powell. 04/20/2021 7:30 A M Medical Record Number: [id number] Patient Account Number: [id number] Date of Birth/Sex: Catherine Powell: 05-30-1965 (56 y.o. Catherine Powell Primary Care Provider: [facility], [place] Other Clinician: Referring Provider: Treating Provider/Extender: Catherine Powell, Catherine Powell in Treatment: 0 Verbal / Phone Orders: No Diagnosis Coding ICD-10 Coding Code Description I89.0 Lymphedema, not elsewhere classified L97.812 Non-pressure chronic ulcer of other part of right lower leg with fat layer exposed Z99.3 Dependence on wheelchair E66.01 Morbid (severe) obesity due to excess calories Follow-up Appointments ppointment in 1 week. - Dr. Heber [place] Return A Bathing/ Shower/ Hygiene May shower with protection but do not get wound dressing(s) wet. - Ok to use Market researcher, can purchase at [facility], [facility], or [employer] Edema Control  - Lymphedema / SCD / Other Elevate legs to the level of the heart or above for 30 minutes daily and/or when sitting, a frequency of: - throughout the day Avoid standing for long periods of time. Exercise regularly Wound Treatment Wound #1 - Lower Leg Wound Laterality: Right, Posterior Cleanser: Soap and Water 1 x Per Week Discharge Instructions: May shower and wash wound with dial antibacterial soap and water prior to dressing  change. Cleanser: Wound Cleanser 1 x Per Week Discharge Instructions: Cleanse the wound with wound cleanser prior to applying a clean dressing using gauze sponges, not tissue or cotton balls. Peri-Wound Care: Sween Lotion (Moisturizing lotion) 1 x Per Week Discharge Instructions: Apply moisturizing lotion as directed Prim Dressing: Hydrofera Blue Ready Foam, 2.5 x2.5 in 1 x Per Week ary Discharge Instructions: Apply to wound bed as instructed Secondary Dressing: Woven Gauze Sponge, Non-Sterile 4x4 in 1 x Per Week Discharge Instructions: Apply over primary dressing as directed. Secondary Dressing: ABD Pad, 8x10 1 x Per Week Discharge Instructions: Apply over primary dressing as directed. Compression Wrap: ThreePress (3 layer compression wrap) 1 x Per Week Discharge Instructions: Apply three layer compression as directed. Electronic Signature(s) Signed: 04/20/2021 9:12:16 AM By: Catherine Shan DO Entered By: Catherine Powell on 04/20/2021 09:08:05 -------------------------------------------------------------------------------- Problem List Details Patient Name: Date of Service: Catherine Powell. 04/20/2021 7:30 A M Medical Record Number: [id number] Patient Account Number: [id number] Date of Birth/Sex: Catherine Powell: 20-Dec-1965 (56 y.o. Elam Dutch Primary Care Provider: [facility], [place] Other Clinician: Referring Provider: Treating Provider/Extender: Quillian Quince Powell in Treatment: 0 Active Problems ICD-10 Encounter Code  Description Active Date MDM Diagnosis L97.812 Non-pressure chronic ulcer of other part of right lower leg with fat layer 04/20/2021 No Yes exposed I89.0 Lymphedema, not elsewhere classified 04/20/2021 No Yes Z99.3 Dependence on wheelchair 04/20/2021 No Yes E66.01 Morbid (severe) obesity due to excess calories 04/20/2021 No Yes Inactive Problems Resolved Problems Electronic Signature(s) Signed: 04/20/2021 9:12:16 AM By: Catherine Shan DO Entered By: Catherine Powell on 04/20/2021 09:02:22 -------------------------------------------------------------------------------- Progress Note Details Patient Name: Date of Service: Catherine Powell. 04/20/2021 7:30 A M Medical Record Number: [id number] Patient Account Number: [id number] Date of Birth/Sex: Catherine Powell: [birth year] (56 y.o. Elam Dutch Primary Care Provider: [facility] [place], [place] Other Clinician: Referring Provider: Treating Provider/Extender: Quillian Quince Powell in Treatment: 0 Subjective Chief Complaint Information obtained from Patient Right posterior leg wound History of Present Illness (HPI) Admission 04/20/2021 Ms. Catherine Powell is a 56 year old female with a past medical history of morbid obesity with BMI of 66 and wheelchair dependent due to this, lymphedema and hypertension that presents to the clinic for a 32-month history of nonhealing ulcer to the right calf. She states this started spontaneously and has been using Unna boots and Xeroform with benefit She saw vein and vascular who recommended bariatric surgery. She currently denies systemic signs of infection. Patient History Information obtained from Patient. Allergies tolmetin sodium (Reaction: Shortness of breath) Family History Unknown History. Social History Never smoker, Marital Status - Married, Alcohol Use - Never, Drug Use - No History, Caffeine Use - Daily - coffee. Medical History Hematologic/Lymphatic Patient has history of  Lymphedema Respiratory Patient has history of Asthma Cardiovascular Patient has history of Hypertension, Peripheral Venous Disease Musculoskeletal Patient has history of Osteoarthritis Hospitalization/Surgery History - knee right arthoscopy. - appendectomy. - cardiac cath. - cervical fusion. - cholecystectomy. - wisdom tooth. Medical A Surgical History Notes nd Hematologic/Lymphatic Hypercholesterolemia Gastrointestinal GERD Musculoskeletal Degenerative joint disease of knees Review of Systems (ROS) Constitutional Symptoms (General Health) Denies complaints or symptoms of Fatigue, Fever, Chills, Marked Weight Change. Eyes Denies complaints or symptoms of Dry Eyes, Vision Changes, Glasses / Contacts. Ear/Nose/Mouth/Throat Denies complaints or symptoms of Chronic sinus problems or rhinitis. Endocrine Denies complaints or symptoms of Heat/cold intolerance. Genitourinary Denies complaints or symptoms of Frequent urination. Integumentary (Skin) Complains or has symptoms of Wounds -  wound on right lower leg. Musculoskeletal Denies complaints or symptoms of Muscle Pain, Muscle Weakness. Neurologic Denies complaints or symptoms of Numbness/parasthesias. Psychiatric Denies complaints or symptoms of Claustrophobia, Suicidal. Objective Constitutional respirations regular, non-labored and within target range for patient.. Vitals Time Taken: 7:47 AM, Height: 68 in, Source: Stated, Weight: 430 lbs, Source: Stated, BMI: 65.4, Temperature: 98.5 F, Pulse: 85 bpm, Respiratory Rate: 20 breaths/min, Blood Pressure: 161/73 mmHg. Cardiovascular 2+ dorsalis pedis/posterior tibialis pulses. Psychiatric pleasant and cooperative. General Notes: Right lower extremity: T the posterior aspect there is an open wound with granulation tissue and slough that was easily removed with gauze. No o signs of surrounding infection. Lymphedema skin changes. Nonpitting edema to the knee Integumentary (Hair,  Skin) Wound #1 status is Open. Original cause of wound was Gradually Appeared. The date acquired was: 01/07/2021. The wound is located on the Right,Posterior Lower Leg. The wound measures 3.9cm length x 1.4cm width x 0.1cm depth; 4.288cm^2 area and 0.429cm^3 volume. There is Fat Layer (Subcutaneous Tissue) exposed. There is no tunneling or undermining noted. There is a medium amount of serous drainage noted. The wound margin is flat and intact. There is medium (34-66%) pink granulation within the wound bed. There is a medium (34-66%) amount of necrotic tissue within the wound bed including Adherent Slough. Assessment Active Problems ICD-10 Non-pressure chronic ulcer of other part of right lower leg with fat layer exposed Lymphedema, not elsewhere classified Dependence on wheelchair Morbid (severe) obesity due to excess calories Patient presents with a 18-month history of ulcer to the right posterior leg due to lymphedema. She showed me pictures of the past several months and the wound appears to be healing. She saw a vein and vascular on 04/06/2021 and thought she would be best served with referral to bariatric surgery. She was not a candidate for ablation despite having venous reflux because of her morbid obesity and potential for worsening wounds. We were not able to obtain ABIs due to patient's size however Doppler of dorsalis pedis and posterior tibial sounded normal. We will go ahead and wrap her in a 3 layer compression and use Hydrofera Blue. She knows not to get this wet or keep this on for more than 7 days. She knows to call with any questions or concerns. 48 minutes was spent on the encounter including face-to-face, EMR review and coordination of care Procedures Wound #1 Pre-procedure diagnosis of Wound #1 is a Lymphedema located on the Right,Posterior Lower Leg . There was a Three Layer Compression Therapy Procedure by Levan Hurst, Powell. Post procedure Diagnosis Wound #1: Same as  Pre-Procedure Plan Follow-up Appointments: Return Appointment in 1 week. - Dr. Heber [place] Bathing/ Shower/ Hygiene: May shower with protection but do not get wound dressing(s) wet. - Ok to use Market researcher, can purchase at [facility], [facility], or [employer] Edema Control - Lymphedema / SCD / Other: Elevate legs to the level of the heart or above for 30 minutes daily and/or when sitting, a frequency of: - throughout the day Avoid standing for long periods of time. Exercise regularly WOUND #1: - Lower Leg Wound Laterality: Right, Posterior Cleanser: Soap and Water 1 x Per Week/ Discharge Instructions: May shower and wash wound with dial antibacterial soap and water prior to dressing change. Cleanser: Wound Cleanser 1 x Per Week/ Discharge Instructions: Cleanse the wound with wound cleanser prior to applying a clean dressing using gauze sponges, not tissue or cotton balls. Peri-Wound Care: Sween Lotion (Moisturizing lotion) 1 x Per Week/ Discharge Instructions: Apply moisturizing  lotion as directed Prim Dressing: Hydrofera Blue Ready Foam, 2.5 x2.5 in 1 x Per Week/ ary Discharge Instructions: Apply to wound bed as instructed Secondary Dressing: Woven Gauze Sponge, Non-Sterile 4x4 in 1 x Per Week/ Discharge Instructions: Apply over primary dressing as directed. Secondary Dressing: ABD Pad, 8x10 1 x Per Week/ Discharge Instructions: Apply over primary dressing as directed. Com pression Wrap: ThreePress (3 layer compression wrap) 1 x Per Week/ Discharge Instructions: Apply three layer compression as directed. 1. Hydrofera Blue under 3 layer compression 2. Follow-up in 1 week Electronic Signature(s) Signed: 04/20/2021 9:12:16 AM By: Catherine Shan DO Entered By: Catherine Powell on 04/20/2021 09:11:26 -------------------------------------------------------------------------------- HxROS Details Patient Name: Date of Service: Catherine Powell. 04/20/2021 7:30 A M Medical Record Number:  [id number] Patient Account Number: [id number] Date of Birth/Sex: Catherine Powell: 07/11/1965 (56 y.o. Elam Dutch Primary Care Provider: [facility], [place] Other Clinician: Referring Provider: Treating Provider/Extender: Quillian Quince Powell in Treatment: 0 Information Obtained From Patient Constitutional Symptoms (General Health) Complaints and Symptoms: Negative for: Fatigue; Fever; Chills; Marked Weight Change Eyes Complaints and Symptoms: Negative for: Dry Eyes; Vision Changes; Glasses / Contacts Ear/Nose/Mouth/Throat Complaints and Symptoms: Negative for: Chronic sinus problems or rhinitis Endocrine Complaints and Symptoms: Negative for: Heat/cold intolerance Genitourinary Complaints and Symptoms: Negative for: Frequent urination Integumentary (Skin) Complaints and Symptoms: Positive for: Wounds - wound on right lower leg Musculoskeletal Complaints and Symptoms: Negative for: Muscle Pain; Muscle Weakness Medical History: Positive for: Osteoarthritis Past Medical History Notes: Degenerative joint disease of knees Neurologic Complaints and Symptoms: Negative for: Numbness/parasthesias Psychiatric Complaints and Symptoms: Negative for: Claustrophobia; Suicidal Hematologic/Lymphatic Medical History: Positive for: Lymphedema Past Medical History Notes: Hypercholesterolemia Respiratory Medical History: Positive for: Asthma Cardiovascular Medical History: Positive for: Hypertension; Peripheral Venous Disease Gastrointestinal Medical History: Past Medical History Notes: GERD Immunological Oncologic Immunizations Pneumococcal Vaccine: Received Pneumococcal Vaccination: No Implantable Devices No devices added Hospitalization / Surgery History Type of Hospitalization/Surgery knee right arthoscopy appendectomy cardiac cath cervical fusion cholecystectomy wisdom tooth Family and Social History Unknown History: Yes; Never smoker; Marital  Status - Married; Alcohol Use: Never; Drug Use: No History; Caffeine Use: Daily - coffee; Financial Concerns: No; Food, Clothing or Shelter Needs: No; Support System Lacking: No; Transportation Concerns: No Electronic Signature(s) Signed: 04/20/2021 9:12:16 AM By: Catherine Shan DO Signed: 04/20/2021 12:55:08 PM By: Baruch Gouty Powell, BSN Signed: 04/20/2021 2:08:26 PM By: Levan Hurst Powell, BSN Entered By: Levan Hurst on 04/20/2021 07:56:04 -------------------------------------------------------------------------------- [place] Details Patient Name: Date of Service: Catherine Powell 04/20/2021 Medical Record Number: [id number] Patient Account Number: [id number] Date of Birth/Sex: Catherine Powell: 1965/09/01 (56 y.o. Elam Dutch Primary Care Provider: [facility], [place] Other Clinician: Referring Provider: Treating Provider/Extender: Catherine Powell, Catherine Powell in Treatment: 0 Diagnosis Coding ICD-10 Codes Code Description [phone] Non-pressure chronic ulcer of other part of right lower leg with fat layer exposed I89.0 Lymphedema, not elsewhere classified Z99.3 Dependence on wheelchair E66.01 Morbid (severe) obesity due to excess calories Facility Procedures CPT4 Code: [id number] Description: 99213 - WOUND CARE VISIT-LEV 3 EST PT Modifier: 25 Quantity: 1 CPT4 Code: [id number] Description: (Facility Use Only) [id number] - APPLY MULTLAY COMPRS LWR RT LEG Modifier: Quantity: 1 Physician Procedures : CPT4 Code Description Modifier [id number] - WC PHYS LEVEL 4 - NEW PT ICD-10 Diagnosis Description [id number] Non-pressure chronic ulcer of other part of right lower leg with fat layer exposed I89.0 Lymphedema, not elsewhere classified Z99.3 Dependence  on wheelchair E66.01 Morbid (severe)  obesity due to excess calories Quantity: 1 Electronic Signature(s) Signed: 04/20/2021 1:16:28 PM By: Catherine Shan DO Signed: 04/20/2021 2:08:26 PM By: Levan Hurst Powell, BSN Previous  Signature: 04/20/2021 9:12:16 AM Version By: Catherine Shan DO Entered By: Levan Hurst on 04/20/2021 12:54:21

## 2021-04-20 NOTE — Progress Notes (Signed)
Catherine, Powell ([id number]) Visit Report for 04/20/2021 Abuse Risk Screen Details Patient Name: Date of Service: Catherine Powell 04/20/2021 7:30 A M Medical Record Number: [id number] Patient Account Number: [id number] Date of Birth/Sex: Treating RN: [birth year] (56 y.o. Catherine Powell Primary Care Joneric Streight: [facility], [place] Other Clinician: Referring Selda Jalbert: Treating Shelva Hetzer/Extender: Augustine Radar, Carina Weeks in Treatment: 0 Abuse Risk Screen Items Answer ABUSE RISK SCREEN: Has anyone close to you tried to hurt or harm you recentlyo No Do you feel uncomfortable with anyone in your familyo No Has anyone forced you do things that you didnt want to doo No Electronic Signature(s) Signed: 04/20/2021 2:08:26 PM By: Zandra Abts RN, BSN Entered By: Zandra Abts on 04/20/2021 07:56:11 -------------------------------------------------------------------------------- Activities of Daily Living Details Patient Name: Date of Service: Catherine Powell 04/20/2021 7:30 A M Medical Record Number: [id number] Patient Account Number: [id number] Date of Birth/Sex: Treating RN: Jul 14, 1965 (56 y.o. Catherine Powell Primary Care Kalisha Keadle: [facility], [place] Other Clinician: Referring Alegandro Macnaughton: Treating Glender Augusta/Extender: Augustine Radar, Carina Weeks in Treatment: 0 Activities of Daily Living Items Answer Activities of Daily Living (Please select one for each item) Drive Automobile Completely Able T Medications ake Completely Able Use T elephone Completely Able Care for Appearance Need Assistance Use T oilet Need Assistance Bath / Shower Need Assistance Dress Self Need Assistance Feed Self Completely Able Walk Completely Able Get In / Out Bed Need Assistance Housework Need Assistance Prepare Meals Need Assistance Handle Money Completely Able Shop for Self Need Assistance Electronic Signature(s) Signed: 04/20/2021 2:08:26 PM By: Zandra Abts RN, BSN Entered  By: Zandra Abts on 04/20/2021 07:56:50 -------------------------------------------------------------------------------- Education Screening Details Patient Name: Date of Service: Catherine Powell. 04/20/2021 7:30 A M Medical Record Number: [id number] Patient Account Number: [id number] Date of Birth/Sex: Treating RN: [birth year] (56 y.o. Catherine Powell Primary Care Lezli Danek: [facility], [place] Other Clinician: Referring Willella Harding: Treating Shyler Hamill/Extender: Cathleen Fears in Treatment: 0 Primary Learner Assessed: Patient Learning Preferences/Education Level/Primary Language Learning Preference: Explanation, Demonstration, Printed Material Highest Education Level: College or Above Preferred Language: English Cognitive Barrier Language Barrier: No Translator Needed: No Memory Deficit: No Emotional Barrier: No Cultural/Religious Beliefs Affecting Medical Care: No Physical Barrier Impaired Vision: No Impaired Hearing: No Decreased Hand dexterity: No Knowledge/Comprehension Knowledge Level: High Comprehension Level: High Ability to understand written instructions: High Ability to understand verbal instructions: High Motivation Anxiety Level: Calm Cooperation: Cooperative Education Importance: Acknowledges Need Interest in Health Problems: Asks Questions Perception: Coherent Willingness to Engage in Self-Management High Activities: Readiness to Engage in Self-Management High Activities: Electronic Signature(s) Signed: 04/20/2021 2:08:26 PM By: Zandra Abts RN, BSN Entered By: Zandra Abts on 04/20/2021 07:57:15 -------------------------------------------------------------------------------- Fall Risk Assessment Details Patient Name: Date of Service: [name] 04/20/2021 7:30 A M Medical Record Number: [id number] Patient Account Number: [id number] Date of Birth/Sex: Treating RN: [birth year] (56 y.o. Catherine Powell Primary Care  Adonte Vanriper: [facility], [place] Other Clinician: Referring Charlene Detter: Treating Augustino Savastano/Extender: Augustine Radar, Carina Weeks in Treatment: 0 Fall Risk Assessment Items Have you had 2 or more falls in the last 12 monthso 0 Yes Have you had any fall that resulted in injury in the last 12 monthso 0 No FALLS RISK SCREEN History of falling - immediate or within 3 months 0 No Secondary diagnosis (Do you have 2 or more medical diagnoseso) 15 Yes Ambulatory aid None/bed rest/wheelchair/nurse 0 No Crutches/cane/walker 15 Yes Furniture 0 No  Intravenous therapy Access/Saline/Heparin Lock 0 No Gait/Transferring Normal/ bed rest/ wheelchair 0 No Weak (short steps with or without shuffle, stooped but able to lift head while walking, may seek 10 Yes support from furniture) Impaired (short steps with shuffle, may have difficulty arising from chair, head down, impaired 0 No balance) Mental Status Oriented to own ability 0 Yes Electronic Signature(s) Signed: 04/20/2021 2:08:26 PM By: Zandra Abts RN, BSN Entered By: Zandra Abts on 04/20/2021 07:58:14 -------------------------------------------------------------------------------- Foot Assessment Details Patient Name: Date of Service: Catherine Powell. 04/20/2021 7:30 A M Medical Record Number: [id number] Patient Account Number: [id number] Date of Birth/Sex: Treating RN: November 13, 1965 (56 y.o. Catherine Powell Primary Care Devlyn Retter: [facility], [place] Other Clinician: Referring Delise Simenson: Treating Kit Brubacher/Extender: Augustine Radar, Carina Weeks in Treatment: 0 Foot Assessment Items Site Locations + = Sensation present, - = Sensation absent, C = Callus, U = Ulcer R = Redness, W = Warmth, M = Maceration, PU = Pre-ulcerative lesion F = Fissure, S = Swelling, D = Dryness Assessment Right: Left: Other Deformity: No No Prior Foot Ulcer: No No Prior Amputation: No No Charcot Joint: No No Ambulatory Status: Ambulatory With  Help Assistance Device: Wheelchair Gait: Surveyor, mining) Signed: 04/20/2021 2:08:26 PM By: Zandra Abts RN, BSN Entered By: Zandra Abts on 04/20/2021 08:05:32 -------------------------------------------------------------------------------- Nutrition Risk Screening Details Patient Name: Date of Service: Catherine Powell. 04/20/2021 7:30 A M Medical Record Number: [id number] Patient Account Number: [id number] Date of Birth/Sex: Treating RN: [birth year] (56 y.o. Catherine Powell Primary Care Tadhg Eskew: [facility], [place] Other Clinician: Referring Milka Windholz: Treating Shatoria Stooksbury/Extender: Augustine Radar, Carina Weeks in Treatment: 0 Height (in): 68 Weight (lbs): 430 Body Mass Index (BMI): 65.4 Nutrition Risk Screening Items Score Screening NUTRITION RISK SCREEN: I have an illness or condition that made me change the kind and/or amount of food I eat 0 No I eat fewer than two meals per day 0 No I eat few fruits and vegetables, or milk products 0 No I have three or more drinks of beer, liquor or wine almost every day 0 No I have tooth or mouth problems that make it hard for me to eat 0 No I don't always have enough money to buy the food I need 0 No I eat alone most of the time 0 No I take three or more different prescribed or over-the-counter drugs a day 1 Yes Without wanting to, I have lost or gained 10 pounds in the last six months 0 No I am not always physically able to shop, cook and/or feed myself 2 Yes Nutrition Protocols Good Risk Protocol Moderate Risk Protocol 0 Provide education on nutrition High Risk Proctocol Risk Level: Moderate Risk Score: 3 Electronic Signature(s) Signed: 04/20/2021 2:08:26 PM By: Zandra Abts RN, BSN Entered By: Zandra Abts on 04/20/2021 07:58:53

## 2021-04-23 ENCOUNTER — Ambulatory Visit (INDEPENDENT_AMBULATORY_CARE_PROVIDER_SITE_OTHER): Payer: BC Managed Care – PPO | Admitting: Family Medicine

## 2021-04-23 ENCOUNTER — Other Ambulatory Visit: Payer: Self-pay

## 2021-04-23 ENCOUNTER — Encounter: Payer: Self-pay | Admitting: Family Medicine

## 2021-04-23 ENCOUNTER — Other Ambulatory Visit (HOSPITAL_COMMUNITY)
Admission: RE | Admit: 2021-04-23 | Discharge: 2021-04-23 | Disposition: A | Discharge status: Home / Self Care | Payer: BC Managed Care – PPO | Source: Ambulatory Visit | Attending: Family Medicine | Admitting: Family Medicine

## 2021-04-23 VITALS — BP 142/88 | HR 89 | Ht 68.0 in | Wt >= 6400 oz

## 2021-04-23 DIAGNOSIS — E7800 Pure hypercholesterolemia, unspecified: Secondary | ICD-10-CM

## 2021-04-23 DIAGNOSIS — I10 Essential (primary) hypertension: Secondary | ICD-10-CM

## 2021-04-23 DIAGNOSIS — Z124 Encounter for screening for malignant neoplasm of cervix: Secondary | ICD-10-CM | POA: Diagnosis not present

## 2021-04-23 DIAGNOSIS — B372 Candidiasis of skin and nail: Secondary | ICD-10-CM

## 2021-04-23 MED ORDER — NYSTATIN 100000 UNIT/GM EX CREA: TOPICAL_CREAM | Freq: Two times a day (BID) | CUTANEOUS | 2 refills | Status: DC

## 2021-04-23 NOTE — Assessment & Plan Note (Signed)
Refill Nystatin cream

## 2021-04-23 NOTE — Patient Instructions (Addendum)
Thank you for coming to see me today. It was a pleasure.    Healthcare Maintenance due Colonoscopy due Mammogram due [place] Vaccine   We will get some labs today.  If they are abnormal or we need to do something about them, I will call you.  If they are normal, I will send you a message on MyChart (if it is active) or a letter in the mail.  If you don't hear from [place] in 2 weeks, please call the office at the number below.   Please follow-up with PCP as needed  If you have any questions or concerns, please do not hesitate to call the office at [phone].  Best,   [name], MD

## 2021-04-23 NOTE — Assessment & Plan Note (Signed)
Pap smear: performed Repeat 5 years if cotesting negative

## 2021-04-23 NOTE — Assessment & Plan Note (Signed)
Elevated today. Compliant with antihypertensives. CMet today Consider adding additional antihypertensive at next visit

## 2021-04-23 NOTE — Assessment & Plan Note (Signed)
Repeat Lipid Profile Continue Atorvastatin 40 mg daily, pending results may need to increase

## 2021-04-23 NOTE — Progress Notes (Signed)
° ° °  SUBJECTIVE:   CHIEF COMPLAINT / HPI: cervical cancer screening  Denies any vaginal bleeding, discharge.  Did have yeast infection after antibiotic therapy last week and treated with Diflucan.    Intertrigo Rash under breast and groin area.  Ran out of medication  Hypertension Compliant with antihypertensives.  Asymptomatic.   PERTINENT  PMH / PSH:  Obesity Class 3 Hyperlipidemia  OBJECTIVE:   BP (!) 142/88    Pulse 89    Ht 5\' 8"  (1.727 m)    Wt (!) 427 lb 6.4 oz (193.9 kg)    LMP 05/15/2012    SpO2 99%    BMI 64.99 kg/m    General: Alert, no acute distress Pelvic Exam chaperoned by CMA Shari        External: normal female genitalia without lesions or masses        Vagina: normal without lesions or masses        Cervix: normal without lesions or masses          ASSESSMENT/PLAN:   Cervical cancer screening Pap smear: performed Repeat 5 years if cotesting negative  HYPERCHOLESTEROLEMIA Repeat Lipid Profile Continue Atorvastatin 40 mg daily, pending results may need to increase   Candidiasis, intertrigo Refill Nystatin cream  Essential hypertension Elevated today. Compliant with antihypertensives. CMet today Consider adding additional antihypertensive at next visit     07/15/2012, MD [facility]

## 2021-04-24 ENCOUNTER — Encounter: Payer: Self-pay | Admitting: Family Medicine

## 2021-04-24 LAB — LIPID PANEL
Chol/HDL Ratio: 3.9 ratio (ref 0.0–4.4)
Cholesterol, Total: 158 mg/dL (ref 100–199)
HDL: 41 mg/dL (ref >39)
LDL Chol Calc (NIH): 93 mg/dL (ref 0–99)
Triglycerides: 132 mg/dL (ref 0–149)
VLDL Cholesterol Cal: 24 mg/dL (ref 5–40)

## 2021-04-24 LAB — COMPREHENSIVE METABOLIC PANEL
ALT: 15 IU/L (ref 0–32)
AST: 14 IU/L (ref 0–40)
Albumin/Globulin Ratio: 1.1 — ABNORMAL LOW (ref 1.2–2.2)
Albumin: 3.6 g/dL — ABNORMAL LOW (ref 3.8–4.9)
Alkaline Phosphatase: 78 IU/L (ref 44–121)
BUN/Creatinine Ratio: 9 (ref 9–23)
BUN: 7 mg/dL (ref 6–24)
Bilirubin Total: 0.4 mg/dL (ref 0.0–1.2)
CO2: 24 mmol/L (ref 20–29)
Calcium: 8.9 mg/dL (ref 8.7–10.2)
Chloride: 101 mmol/L (ref 96–106)
Creatinine, Ser: 0.78 mg/dL (ref 0.57–1.00)
Globulin, Total: 3.2 g/dL (ref 1.5–4.5)
Glucose: 134 mg/dL — ABNORMAL HIGH (ref 70–99)
Potassium: 4.3 mmol/L (ref 3.5–5.2)
Sodium: 140 mmol/L (ref 134–144)
Total Protein: 6.8 g/dL (ref 6.0–8.5)
eGFR: 89 mL/min/{1.73_m2} (ref >59)

## 2021-04-25 LAB — CYTOLOGY - PAP
Adequacy: ABSENT
Comment: NEGATIVE
Diagnosis: NEGATIVE
High risk HPV: NEGATIVE

## 2021-04-27 ENCOUNTER — Encounter (HOSPITAL_BASED_OUTPATIENT_CLINIC_OR_DEPARTMENT_OTHER): Payer: BC Managed Care – PPO | Admitting: Internal Medicine

## 2021-04-27 ENCOUNTER — Other Ambulatory Visit: Payer: Self-pay

## 2021-04-27 ENCOUNTER — Encounter: Payer: Self-pay | Admitting: Family Medicine

## 2021-04-27 DIAGNOSIS — I890 Lymphedema, not elsewhere classified: Secondary | ICD-10-CM

## 2021-04-27 DIAGNOSIS — L97812 Non-pressure chronic ulcer of other part of right lower leg with fat layer exposed: Secondary | ICD-10-CM

## 2021-04-27 DIAGNOSIS — L03115 Cellulitis of right lower limb: Secondary | ICD-10-CM

## 2021-04-27 DIAGNOSIS — Z993 Dependence on wheelchair: Secondary | ICD-10-CM | POA: Diagnosis not present

## 2021-04-27 DIAGNOSIS — K219 Gastro-esophageal reflux disease without esophagitis: Secondary | ICD-10-CM | POA: Diagnosis not present

## 2021-04-27 DIAGNOSIS — I739 Peripheral vascular disease, unspecified: Secondary | ICD-10-CM | POA: Diagnosis not present

## 2021-04-27 DIAGNOSIS — B3731 Acute candidiasis of vulva and vagina: Secondary | ICD-10-CM | POA: Diagnosis not present

## 2021-04-27 DIAGNOSIS — E7800 Pure hypercholesterolemia, unspecified: Secondary | ICD-10-CM | POA: Diagnosis not present

## 2021-04-27 DIAGNOSIS — M170 Bilateral primary osteoarthritis of knee: Secondary | ICD-10-CM | POA: Diagnosis not present

## 2021-04-27 DIAGNOSIS — Z6841 Body Mass Index (BMI) 40.0 and over, adult: Secondary | ICD-10-CM | POA: Diagnosis not present

## 2021-04-27 DIAGNOSIS — I10 Essential (primary) hypertension: Secondary | ICD-10-CM | POA: Diagnosis not present

## 2021-04-27 DIAGNOSIS — J45909 Unspecified asthma, uncomplicated: Secondary | ICD-10-CM | POA: Diagnosis not present

## 2021-04-30 ENCOUNTER — Ambulatory Visit: Payer: BC Managed Care – PPO

## 2021-04-30 NOTE — Progress Notes (Signed)
Catherine Powell ([id number]) Visit Report for 04/27/2021 Arrival Information Details Patient Name: Date of Service: Catherine Powell 04/27/2021 9:00 A M Medical Record Number: [id number] Patient Account Number: [id number] Date of Birth/Sex: Treating RN: 1966-01-24 (56 y.o. Sue Lush Primary Care Catherine Powell: [facility], [place] Other Clinician: Referring Catherine Powell: Treating Catherine Powell/Extender: Orlene Och [place], [place] Weeks in Treatment: 1 Visit Information History Since Last Visit Added or deleted any medications: No Patient Arrived: Wheel Chair Any new allergies or adverse reactions: No Arrival Time: 09:23 Had a fall or experienced change in No Accompanied By: son activities of daily living that may affect Transfer Assistance: Manual risk of falls: Patient Identification Verified: Yes Signs or symptoms of abuse/neglect since last visito No Secondary Verification Process Completed: Yes Hospitalized since last visit: No Patient Requires Transmission-Based Precautions: No Implantable device outside of the clinic excluding No Patient Has Alerts: No cellular tissue based products placed in the center since last visit: Has Dressing in Place as Prescribed: Yes Has Compression in Place as Prescribed: Yes Pain Present Now: Yes Electronic Signature(s) Signed: 04/27/2021 12:25:37 PM By: Lorrin Jackson Entered By: Lorrin Jackson on 04/27/2021 09:30:42 -------------------------------------------------------------------------------- Compression Therapy Details Patient Name: Date of Service: Catherine Curling H. 04/27/2021 9:00 A M Medical Record Number: [id number] Patient Account Number: [id number] Date of Birth/Sex: Treating RN: 1965-09-02 (56 y.o. Sue Lush Primary Care Catherine Powell: [facility], [place] Other Clinician: Referring Catherine Powell: Treating Catherine Powell/Extender: Orlene Och LSH, [place] Weeks in Treatment: 1 Compression Therapy Performed for Wound Assessment:  Wound #1 Right,Posterior Lower Leg Performed By: Clinician Lorrin Jackson, RN Compression Type: Three Layer Post Procedure Diagnosis Same as Pre-procedure Electronic Signature(s) Signed: 04/27/2021 12:25:37 PM By: Lorrin Jackson Entered By: Lorrin Jackson on 04/27/2021 10:02:42 -------------------------------------------------------------------------------- Encounter Discharge Information Details Patient Name: Date of Service: Catherine Curling H. 04/27/2021 9:00 A M Medical Record Number: [id number] Patient Account Number: [id number] Date of Birth/Sex: Treating RN: 1966-02-18 (56 y.o. Sue Lush Primary Care Catherine Powell: [facility], [place] Other Clinician: Referring Markeia Powell: Treating Catherine Powell/Extender: Orlene Och [place], [place] Weeks in Treatment: 1 Encounter Discharge Information Items Discharge Condition: Stable Ambulatory Status: Wheelchair Discharge Destination: Home Transportation: Private Auto Accompanied By: Son Schedule Follow-up Appointment: Yes Clinical Summary of Care: Provided on 04/27/2021 Form Type Recipient Paper Patient Patient Electronic Signature(s) Signed: 04/27/2021 10:37:22 AM By: Lorrin Jackson Entered By: Lorrin Jackson on 04/27/2021 10:37:22 -------------------------------------------------------------------------------- Lower Extremity Assessment Details Patient Name: Date of Service: Catherine Curling H. 04/27/2021 9:00 A M Medical Record Number: [id number] Patient Account Number: [id number] Date of Birth/Sex: Treating RN: [birth year] (56 y.o. Sue Lush Primary Care Hansini Clodfelter: [facility], [place] Other Clinician: Referring Catherine Powell: Treating Catherine Powell/Extender: Orlene Och [place], [place] Weeks in Treatment: 1 Edema Assessment Assessed: [Left: Yes] [Right: Yes] Edema: [Left: Yes] [Right: Yes] Calf Left: Right: Point of Measurement: From Medial Instep 74 cm 77 cm Ankle Left: Right: Point of Measurement: From Medial Instep 33 cm  35 cm Knee To Floor Left: Right: From Medial Instep 42 cm 42 cm Vascular Assessment Pulses: Dorsalis Pedis Palpable: [Right:Yes] Electronic Signature(s) Signed: 04/27/2021 12:25:37 PM By: Lorrin Jackson Entered By: Lorrin Jackson on 04/27/2021 10:27:31 -------------------------------------------------------------------------------- Multi Wound Chart Details Patient Name: Date of Service: Catherine Curling H. 04/27/2021 9:00 A M Medical Record Number: [id number] Patient Account Number: [id number] Date of Birth/Sex: Treating RN: [birth year] (56 y.o. Catherine Powell Primary Care Yarethzy Croak: [facility], [place] Other  Clinician: Referring Kechia Yahnke: Treating Telesforo Brosnahan/Extender: Orlene Och LSH, TA [place] Weeks in Treatment: 1 Vital Signs Height(in): 68 Pulse(bpm): 84 Weight(lbs): 430 Blood Pressure(mmHg): 135/79 Body Mass Index(BMI): 65.4 Temperature(F): 98.6 Respiratory Rate(breaths/min): 22 Photos: [Powell/A:Powell/A] Right, Posterior Lower Leg Powell/A Powell/A Wound Location: Gradually Appeared Powell/A Powell/A Wounding Event: Lymphedema Powell/A Powell/A Primary Etiology: Lymphedema, Asthma, Hypertension, Powell/A Powell/A Comorbid History: Peripheral Venous Disease, Osteoarthritis 01/07/2021 Powell/A Powell/A Date Acquired: 1 Powell/A Powell/A Weeks of Treatment: Open Powell/A Powell/A Wound Status: No Powell/A Powell/A Wound Recurrence: 0.6x1x0.1 Powell/A Powell/A Measurements L x W x D (cm) 0.471 Powell/A Powell/A A (cm) : rea 0.047 Powell/A Powell/A Volume (cm) : 89.00% Powell/A Powell/A % Reduction in Area: 89.00% Powell/A Powell/A % Reduction in Volume: Full Thickness Without Exposed Powell/A Powell/A Classification: Support Structures Medium Powell/A Powell/A Exudate Amount: Serosanguineous Powell/A Powell/A Exudate Type: red, brown Powell/A Powell/A Exudate Color: Distinct, outline attached Powell/A Powell/A Wound Margin: Large (67-100%) Powell/A Powell/A Granulation Amount: Pink Powell/A Powell/A Granulation Quality: Small (1-33%) Powell/A Powell/A Necrotic Amount: Fat Layer (Subcutaneous Tissue): Yes Powell/A Powell/A Exposed Structures: Fascia:  No Tendon: No Muscle: No Joint: No Bone: No Small (1-33%) Powell/A Powell/A Epithelialization: Compression Therapy Powell/A Powell/A Procedures Performed: Treatment Notes Wound #1 (Lower Leg) Wound Laterality: Right, Posterior Cleanser Soap and Water Discharge Instruction: May shower and wash wound with dial antibacterial soap and water prior to dressing change. Wound Cleanser Discharge Instruction: Cleanse the wound with wound cleanser prior to applying a clean dressing using gauze sponges, not tissue or cotton balls. Peri-Wound Care Triamcinolone 15 (g) Discharge Instruction: Use triamcinolone 15 (g) as directed Sween Lotion (Moisturizing lotion) Discharge Instruction: Apply moisturizing lotion as directed Topical Primary Dressing Hydrofera Blue Ready Foam, 2.5 x2.5 in Discharge Instruction: Apply to wound bed as instructed Xeroform Occlusive Gauze Dressing, 4x4 in Discharge Instruction: Apply to dried area at top Secondary Dressing Woven Gauze Sponge, Non-Sterile 4x4 in Discharge Instruction: Apply over primary dressing as directed. ABD Pad, 8x10 Discharge Instruction: Apply over primary dressing as directed. Secured With Compression Wrap ThreePress (3 layer compression wrap) Discharge Instruction: Apply three layer compression as directed. Unnaboot w/Calamine, 4x10 (in/yd) Discharge Instruction: Apply at top to anchor wrap Compression Stockings Add-Ons Electronic Signature(s) Signed: 04/27/2021 10:48:54 AM By: Kalman Shan DO Signed: 04/30/2021 5:43:45 PM By: Baruch Gouty RN, BSN Entered By: Kalman Shan on 04/27/2021 10:43:16 -------------------------------------------------------------------------------- [place] Details Patient Name: Date of Service: Catherine Curling H. 04/27/2021 9:00 A M Medical Record Number: [id number] Patient Account Number: [id number] Date of Birth/Sex: Treating RN: Oct 28, 1965 (56 y.o. Sue Lush Primary Care Andrew Soria:  [facility], [place] Other Clinician: Referring Pocahontas Cohenour: Treating Korban Shearer/Extender: Orlene Och [place], [place] Weeks in Treatment: 1 Multidisciplinary Care Plan reviewed with physician Active Inactive Abuse / Safety / Falls / Self Care Management Nursing Diagnoses: History of Falls Potential for injury related to falls Goals: Patient will not experience any injury related to falls Date Initiated: 04/20/2021 Target Resolution Date: 05/18/2021 Goal Status: Active Patient/caregiver will verbalize/demonstrate measures taken to prevent injury and/or falls Date Initiated: 04/20/2021 Target Resolution Date: 05/18/2021 Goal Status: Active Interventions: Assess Activities of Daily Living upon admission and as needed Assess fall risk on admission and as needed Assess: immobility, friction, shearing, incontinence upon admission and as needed Assess impairment of mobility on admission and as needed per policy Assess personal safety and home safety (as indicated) on admission and as needed Assess self care needs on admission and as needed Provide education on fall prevention Provide education  on personal and home safety Notes: Venous Leg Ulcer Nursing Diagnoses: Actual venous Insuffiency (use after diagnosis is confirmed) Goals: Patient will maintain optimal edema control Date Initiated: 04/20/2021 Target Resolution Date: 05/18/2021 Goal Status: Active Interventions: Assess peripheral edema status every visit. Compression as ordered Provide education on venous insufficiency Notes: Wound/Skin Impairment Nursing Diagnoses: Impaired tissue integrity Knowledge deficit related to ulceration/compromised skin integrity Goals: Patient/caregiver will verbalize understanding of skin care regimen Date Initiated: 04/20/2021 Target Resolution Date: 05/18/2021 Goal Status: Active Interventions: Assess patient/caregiver ability to obtain necessary supplies Assess patient/caregiver ability to  perform ulcer/skin care regimen upon admission and as needed Assess ulceration(s) every visit Provide education on ulcer and skin care Notes: Electronic Signature(s) Signed: 04/27/2021 12:25:37 PM By: Lorrin Jackson Entered By: Lorrin Jackson on 04/27/2021 09:23:17 -------------------------------------------------------------------------------- Pain Assessment Details Patient Name: Date of Service: Catherine Curling H. 04/27/2021 9:00 A M Medical Record Number: [id number] Patient Account Number: [id number] Date of Birth/Sex: Treating RN: 1966/02/25 (56 y.o. Sue Lush Primary Care Lynnix Schoneman: [facility], [place] Other Clinician: Referring Jahayra Mazo: Treating Levester Waldridge/Extender: Orlene Och LSH, [place] Weeks in Treatment: 1 Active Problems Location of Pain Severity and Description of Pain Patient Has Paino Yes Site Locations Pain Location: Pain in Ulcers With Dressing Change: Yes Duration of the Pain. Constant / Intermittento Intermittent Rate the pain. Current Pain Level: 6 Character of Pain Describe the Pain: Throbbing Pain Management and Medication Current Pain Management: Medication: Yes Cold Application: No Rest: Yes Massage: No Activity: No T.E.Powell.S.: No Heat Application: No Leg drop or elevation: No Is the Current Pain Management Adequate: Adequate How does your wound impact your activities of daily livingo Sleep: No Bathing: No Appetite: No Relationship With Others: No Bladder Continence: No Emotions: No Bowel Continence: No Work: No Toileting: No Drive: No Dressing: No Hobbies: No Electronic Signature(s) Signed: 04/27/2021 12:25:37 PM By: Lorrin Jackson Entered By: Lorrin Jackson on 04/27/2021 09:30:28 -------------------------------------------------------------------------------- Patient/Caregiver Education Details Patient Name: Date of Service: Catherine Powell, Catherine ERLY H. 2/17/2023andnbsp9:00 A M Medical Record Number: [id number] Patient  Account Number: [id number] Date of Birth/Gender: Treating RN: 12/04/1965 (56 y.o. Sue Lush Primary Care Physician: [facility], [place] Other Clinician: Referring Physician: Treating Physician/Extender: Orlene Och [place], [place] Weeks in Treatment: 1 Education Assessment Education Provided To: Patient Education Topics Provided Venous: Methods: Explain/Verbal, Printed Responses: State content correctly Wound/Skin Impairment: Methods: Explain/Verbal, Printed Responses: State content correctly Electronic Signature(s) Signed: 04/27/2021 12:25:37 PM By: Lorrin Jackson Entered By: Lorrin Jackson on 04/27/2021 09:23:37 -------------------------------------------------------------------------------- Wound Assessment Details Patient Name: Date of Service: Catherine Curling H. 04/27/2021 9:00 A M Medical Record Number: [id number] Patient Account Number: [id number] Date of Birth/Sex: Treating RN: 1965-08-05 (56 y.o. Sue Lush Primary Care Anella Nakata: [facility], [place] Other Clinician: Referring Kara Mierzejewski: Treating Radek Carnero/Extender: Orlene Och LSH, [place] Weeks in Treatment: 1 Wound Status Wound Number: 1 Primary Lymphedema Etiology: Wound Location: Right, Posterior Lower Leg Wound Open Wounding Event: Gradually Appeared Status: Date Acquired: 01/07/2021 Comorbid Lymphedema, Asthma, Hypertension, Peripheral Venous Weeks Of Treatment: 1 History: Disease, Osteoarthritis Clustered Wound: No Photos Wound Measurements Length: (cm) 0.6 Width: (cm) 1 Depth: (cm) 0.1 Area: (cm) 0.471 Volume: (cm) 0.047 % Reduction in Area: 89% % Reduction in Volume: 89% Epithelialization: Small (1-33%) Tunneling: No Undermining: No Wound Description Classification: Full Thickness Without Exposed Support Structures Wound Margin: Distinct, outline attached Exudate Amount: Medium Exudate Type: Serosanguineous Exudate Color: red, brown Foul Odor After Cleansing:  No Slough/Fibrino Yes  Wound Bed Granulation Amount: Large (67-100%) Exposed Structure Granulation Quality: Pink Fascia Exposed: No Necrotic Amount: Small (1-33%) Fat Layer (Subcutaneous Tissue) Exposed: Yes Necrotic Quality: Adherent Slough Tendon Exposed: No Muscle Exposed: No Joint Exposed: No Bone Exposed: No Treatment Notes Wound #1 (Lower Leg) Wound Laterality: Right, Posterior Cleanser Soap and Water Discharge Instruction: May shower and wash wound with dial antibacterial soap and water prior to dressing change. Wound Cleanser Discharge Instruction: Cleanse the wound with wound cleanser prior to applying a clean dressing using gauze sponges, not tissue or cotton balls. Peri-Wound Care Triamcinolone 15 (g) Discharge Instruction: Use triamcinolone 15 (g) as directed Sween Lotion (Moisturizing lotion) Discharge Instruction: Apply moisturizing lotion as directed Topical Primary Dressing Hydrofera Blue Ready Foam, 2.5 x2.5 in Discharge Instruction: Apply to wound bed as instructed Xeroform Occlusive Gauze Dressing, 4x4 in Discharge Instruction: Apply to dried area at top Secondary Dressing Woven Gauze Sponge, Non-Sterile 4x4 in Discharge Instruction: Apply over primary dressing as directed. ABD Pad, 8x10 Discharge Instruction: Apply over primary dressing as directed. Secured With Compression Wrap ThreePress (3 layer compression wrap) Discharge Instruction: Apply three layer compression as directed. Unnaboot w/Calamine, 4x10 (in/yd) Discharge Instruction: Apply at top to anchor wrap Compression Stockings Add-Ons Electronic Signature(s) Signed: 04/27/2021 12:25:37 PM By: Lorrin Jackson Signed: 04/27/2021 12:57:37 PM By: Deon Pilling RN, BSN Entered By: Deon Pilling on 04/27/2021 09:38:54 -------------------------------------------------------------------------------- Vitals Details Patient Name: Date of Service: Catherine Curling H. 04/27/2021 9:00 A M Medical  Record Number: [id number] Patient Account Number: [id number] Date of Birth/Sex: Treating RN: [birth year] (56 y.o. Sue Lush Primary Care Zabria Liss: [facility], [place] Other Clinician: Referring Jakyia Gaccione: Treating Cassy Sprowl/Extender: Orlene Och LSH, TA [place] Weeks in Treatment: 1 Vital Signs Time Taken: 09:30 Temperature (F): 98.6 Height (in): 68 Pulse (bpm): 84 Weight (lbs): 430 Respiratory Rate (breaths/min): 22 Body Mass Index (BMI): 65.4 Blood Pressure (mmHg): 135/79 Reference Range: 80 - 120 mg / dl Electronic Signature(s) Signed: 04/27/2021 12:25:37 PM By: Lorrin Jackson Entered By: Lorrin Jackson on 04/27/2021 09:32:45

## 2021-04-30 NOTE — Patient Instructions (Incomplete)
Thank you for coming to see me today. It was a pleasure. Today we talked about:  ° ° ° °Please follow-up with *** in *** ° °If you have any questions or concerns, please do not hesitate to call the office at [phone]. ° °Best,  ° °Bryann Mcnealy, MD   °

## 2021-04-30 NOTE — Progress Notes (Unsigned)
° ° °  SUBJECTIVE:   CHIEF COMPLAINT / HPI:   ***  PERTINENT  PMH / PSH: ***  OBJECTIVE:   LMP 05/15/2012    General: Alert, no acute distress Cardio: Normal S1 and S2, RRR, no r/m/g Pulm: CTAB, normal work of breathing Abdomen: Bowel sounds normal. Abdomen soft and non-tender.  Extremities: No peripheral edema.  Neuro: Cranial nerves grossly intact   ASSESSMENT/PLAN:   No problem-specific Assessment & Plan notes found for this encounter.     Carollee Leitz, MD [place]

## 2021-04-30 NOTE — Progress Notes (Signed)
Powell, Catherine ([id number]) Visit Report for 04/27/2021 Chief Complaint Document Details Patient Name: Date of Service: Catherine Powell 04/27/2021 9:00 A M Medical Record Number: [id number] Patient Account Number: [id number] Date of Birth/Sex: Treating RN: 03-Oct-1965 (56 y.o. Catherine Powell Standard Primary Care Provider: [facility], [place] DXA Other Clinician: Referring Provider: Treating Provider/Extender: Harland German LSH, TA [place] Weeks in Treatment: 1 Information Obtained from: Patient Chief Complaint Right posterior leg wound Electronic Signature(s) Signed: 04/27/2021 10:48:54 AM By: Geralyn Corwin DO Entered By: Geralyn Corwin on 04/27/2021 10:43:23 -------------------------------------------------------------------------------- HPI Details Patient Name: Date of Service: Catherine Hock H. 04/27/2021 9:00 A M Medical Record Number: [id number] Patient Account Number: [id number] Date of Birth/Sex: Treating RN: 02/06/1966 (56 y.o. Catherine Powell Standard Primary Care Provider: [facility], [place] [facility]CN Other Clinician: Referring Provider: Treating Provider/Extender: Harland German LSH, TA [place] Weeks in Treatment: 1 History of Present Illness HPI Description: Admission 04/20/2021 Ms. Catherine Powell is a 56 year old female with a past medical history of morbid obesity with BMI of 66 and wheelchair dependent due to this, lymphedema and hypertension that presents to the clinic for a 87-month history of nonhealing ulcer to the right calf. She states this started spontaneously and has been using Unna boots and Xeroform with benefit She saw vein and vascular who recommended bariatric surgery. She currently denies systemic signs of infection. 2/17; patient presents for follow-up. She tolerated the compression wrap well. She does report increased tenderness to the surrounding wound bed. Electronic Signature(s) Signed: 04/27/2021 10:48:54 AM By: Geralyn Corwin DO Entered By: Geralyn Corwin on  04/27/2021 10:43:53 -------------------------------------------------------------------------------- Physical Exam Details Patient Name: Date of Service: Catherine Hock H. 04/27/2021 9:00 A M Medical Record Number: [id number] Patient Account Number: [id number] Date of Birth/Sex: Treating RN: [birth year] (57 y.o. Catherine Powell Standard Primary Care Provider: [facility], [place]A Other Clinician: Referring Provider: Treating Provider/Extender: Harland German LSH, TA [place] Weeks in Treatment: 1 Constitutional respirations regular, non-labored and within target range for patient.. Cardiovascular 2+ dorsalis pedis/posterior tibialis pulses. Psychiatric pleasant and cooperative. Notes Right lower extremity: T the posterior aspect there is an open wound with granulation tissue and scant nonviable tissue. Epithelization occurring to the edges. o Slight increase in erythema warmth and tenderness to the periwound. Nonpitting edema to the knee. Lymphedema skin changes. Electronic Signature(s) Signed: 04/27/2021 10:48:54 AM By: Geralyn Corwin DO Entered By: Geralyn Corwin on 04/27/2021 10:44:58 -------------------------------------------------------------------------------- Physician Orders Details Patient Name: Date of Service: Catherine Hock H. 04/27/2021 9:00 A M Medical Record Number: [id number] Patient Account Number: [id number] Date of Birth/Sex: Treating RN: 12-01-1965 (56 y.o. Catherine Powell Primary Care Provider: [facility], [place] [facility]A Other Clinician: Referring Provider: Treating Provider/Extender: Harland German LSH, TA [place] Weeks in Treatment: 1 Verbal / Phone Orders: No Diagnosis Coding ICD-10 Coding Code Description [phone] Non-pressure chronic ulcer of other part of right lower leg with fat layer exposed I89.0 Lymphedema, not elsewhere classified Z99.3 Dependence on wheelchair E66.01 Morbid (severe) obesity due to excess calories Follow-up Appointments ppointment in 1  week. - Dr. Mikey Bussing Return A Other: - Prescription for Keflex sent to pharmacy Bathing/ Shower/ Hygiene May shower with protection but do not get wound dressing(s) wet. - Ok to use Arts development officer, can purchase at [facility], [facility], or [employer] Edema Control - Lymphedema / SCD / Other Elevate legs to the level of the heart or above for 30 minutes daily and/or when sitting, a frequency of: - throughout the day Avoid  standing for long periods of time. Exercise regularly Compression stocking or Garment 30-40 mm/Hg pressure to: - Ordered Juxtalite from [employer]. Patient requested for bilateral and pay for left leg wrap. Non Wound Condition pply the following to affected area as directed: - Can use Amlactin (Ammonium Lactate) Lotion to left leg. A Wound Treatment Wound #1 - Lower Leg Wound Laterality: Right, Posterior Cleanser: Soap and Water 1 x Per Week Discharge Instructions: May shower and wash wound with dial antibacterial soap and water prior to dressing change. Cleanser: Wound Cleanser 1 x Per Week Discharge Instructions: Cleanse the wound with wound cleanser prior to applying a clean dressing using gauze sponges, not tissue or cotton balls. Peri-Wound Care: Triamcinolone 15 (g) 1 x Per Week Discharge Instructions: Use triamcinolone 15 (g) as directed Peri-Wound Care: Sween Lotion (Moisturizing lotion) 1 x Per Week Discharge Instructions: Apply moisturizing lotion as directed Prim Dressing: Hydrofera Blue Ready Foam, 2.5 x2.5 in 1 x Per Week ary Discharge Instructions: Apply to wound bed as instructed Prim Dressing: Xeroform Occlusive Gauze Dressing, 4x4 in 1 x Per Week ary Discharge Instructions: Apply to dried area at top Secondary Dressing: Woven Gauze Sponge, Non-Sterile 4x4 in 1 x Per Week Discharge Instructions: Apply over primary dressing as directed. Secondary Dressing: ABD Pad, 8x10 1 x Per Week Discharge Instructions: Apply over primary dressing as directed. Compression Wrap:  ThreePress (3 layer compression wrap) 1 x Per Week Discharge Instructions: Apply three layer compression as directed. Compression Wrap: Unnaboot w/Calamine, 4x10 (in/yd) 1 x Per Week Discharge Instructions: Apply at top to anchor wrap Patient Medications llergies: tolmetin sodium A Notifications Medication Indication Start End 04/27/2021 Keflex DOSE 1 - oral 500 mg capsule - 1 capsule oral q6h x 7 days Electronic Signature(s) Signed: 04/27/2021 10:48:54 AM By: Geralyn Corwin DO Previous Signature: 04/27/2021 10:46:02 AM Version By: Geralyn Corwin DO Entered By: Geralyn Corwin on 04/27/2021 10:46:33 -------------------------------------------------------------------------------- Problem List Details Patient Name: Date of Service: Catherine Hock H. 04/27/2021 9:00 A M Medical Record Number: [id number] Patient Account Number: [id number] Date of Birth/Sex: Treating RN: March 04, 1966 (56 y.o. Catherine Powell Primary Care Provider: [facility], [place]A Other Clinician: Referring Provider: Treating Provider/Extender: Harland German LSH, TA [place] Weeks in Treatment: 1 Active Problems ICD-10 Encounter Code Description Active Date MDM Diagnosis [id number] Non-pressure chronic ulcer of other part of right lower leg with fat layer 04/20/2021 No Yes exposed I89.0 Lymphedema, not elsewhere classified 04/20/2021 No Yes Z99.3 Dependence on wheelchair 04/20/2021 No Yes E66.01 Morbid (severe) obesity due to excess calories 04/20/2021 No Yes Inactive Problems Resolved Problems Electronic Signature(s) Signed: 04/27/2021 10:48:54 AM By: Geralyn Corwin DO Entered By: Geralyn Corwin on 04/27/2021 10:43:11 -------------------------------------------------------------------------------- Progress Note Details Patient Name: Date of Service: Catherine Hock H. 04/27/2021 9:00 A M Medical Record Number: [id number] Patient Account Number: [id number] Date of Birth/Sex: Treating RN: 05-28-1965 (56  y.o. Catherine Powell Standard Primary Care Provider: [facility], [place] HUT Other Clinician: Referring Provider: Treating Provider/Extender: Harland German LSH, TA [place] Weeks in Treatment: 1 Subjective Chief Complaint Information obtained from Patient Right posterior leg wound History of Present Illness (HPI) Admission 04/20/2021 Ms. Catherine Powell is a 56 year old female with a past medical history of morbid obesity with BMI of 66 and wheelchair dependent due to this, lymphedema and hypertension that presents to the clinic for a 9-month history of nonhealing ulcer to the right calf. She states this started spontaneously and has been using Unna boots and Xeroform with benefit She saw  vein and vascular who recommended bariatric surgery. She currently denies systemic signs of infection. 2/17; patient presents for follow-up. She tolerated the compression wrap well. She does report increased tenderness to the surrounding wound bed. Patient History Information obtained from Patient. Family History Unknown History. Social History Never smoker, Marital Status - Married, Alcohol Use - Never, Drug Use - No History, Caffeine Use - Daily - coffee. Medical History Hematologic/Lymphatic Patient has history of Lymphedema Respiratory Patient has history of Asthma Cardiovascular Patient has history of Hypertension, Peripheral Venous Disease Musculoskeletal Patient has history of Osteoarthritis Hospitalization/Surgery History - knee right arthoscopy. - appendectomy. - cardiac cath. - cervical fusion. - cholecystectomy. - wisdom tooth. Medical A Surgical History Notes nd Hematologic/Lymphatic Hypercholesterolemia Gastrointestinal GERD Musculoskeletal Degenerative joint disease of knees Objective Constitutional respirations regular, non-labored and within target range for patient.. Vitals Time Taken: 9:30 AM, Height: 68 in, Weight: 430 lbs, BMI: 65.4, Temperature: 98.6 F, Pulse: 84 bpm, Respiratory  Rate: 22 breaths/min, Blood Pressure: 135/79 mmHg. Cardiovascular 2+ dorsalis pedis/posterior tibialis pulses. Psychiatric pleasant and cooperative. General Notes: Right lower extremity: T the posterior aspect there is an open wound with granulation tissue and scant nonviable tissue. Epithelization occurring o to the edges. Slight increase in erythema warmth and tenderness to the periwound. Nonpitting edema to the knee. Lymphedema skin changes. Integumentary (Hair, Skin) Wound #1 status is Open. Original cause of wound was Gradually Appeared. The date acquired was: 01/07/2021. The wound has been in treatment 1 weeks. The wound is located on the Right,Posterior Lower Leg. The wound measures 0.6cm length x 1cm width x 0.1cm depth; 0.471cm^2 area and 0.047cm^3 volume. There is Fat Layer (Subcutaneous Tissue) exposed. There is no tunneling or undermining noted. There is a medium amount of serosanguineous drainage noted. The wound margin is distinct with the outline attached to the wound base. There is large (67-100%) pink granulation within the wound bed. There is a small (1- 33%) amount of necrotic tissue within the wound bed including Adherent Slough. Assessment Active Problems ICD-10 Non-pressure chronic ulcer of other part of right lower leg with fat layer exposed Lymphedema, not elsewhere classified Dependence on wheelchair Morbid (severe) obesity due to excess calories Patient has done well with the compression wrap and Hydrofera Blue. Her wound appears well-healing. She does have some increased erythema and warmth to the periwound and we will treat for potential soft tissue infection with Keflex. We will add Xeroform to the areas that are healed to help remove some of the dried lymph fluid but will continue with Hydrofera Blue to the wound bed under 3 layer compression. Follow-up in 1 week. Procedures Wound #1 Pre-procedure diagnosis of Wound #1 is a Lymphedema located on the  Right,Posterior Lower Leg . There was a Three Layer Compression Therapy Procedure by Antonieta Iba, RN. Post procedure Diagnosis Wound #1: Same as Pre-Procedure Plan Follow-up Appointments: Return Appointment in 1 week. - Dr. Mikey Bussing Other: - Prescription for Keflex sent to pharmacy Bathing/ Shower/ Hygiene: May shower with protection but do not get wound dressing(s) wet. - Ok to use Arts development officer, can purchase at [facility], [facility], or [employer] Edema Control - Lymphedema / SCD / Other: Elevate legs to the level of the heart or above for 30 minutes daily and/or when sitting, a frequency of: - throughout the day Avoid standing for long periods of time. Exercise regularly Compression stocking or Garment 30-40 mm/Hg pressure to: - Ordered Juxtalite from [employer]. Patient requested for bilateral and pay for left leg wrap. Non Wound Condition:  Apply the following to affected area as directed: - Can use Amlactin (Ammonium Lactate) Lotion to left leg. The following medication(s) was prescribed: Keflex oral 500 mg capsule 1 1 capsule oral q6h x 7 days starting 04/27/2021 WOUND #1: - Lower Leg Wound Laterality: Right, Posterior Cleanser: Soap and Water 1 x Per Week/ Discharge Instructions: May shower and wash wound with dial antibacterial soap and water prior to dressing change. Cleanser: Wound Cleanser 1 x Per Week/ Discharge Instructions: Cleanse the wound with wound cleanser prior to applying a clean dressing using gauze sponges, not tissue or cotton balls. Peri-Wound Care: Triamcinolone 15 (g) 1 x Per Week/ Discharge Instructions: Use triamcinolone 15 (g) as directed Peri-Wound Care: Sween Lotion (Moisturizing lotion) 1 x Per Week/ Discharge Instructions: Apply moisturizing lotion as directed Prim Dressing: Hydrofera Blue Ready Foam, 2.5 x2.5 in 1 x Per Week/ ary Discharge Instructions: Apply to wound bed as instructed Prim Dressing: Xeroform Occlusive Gauze Dressing, 4x4 in 1 x Per  Week/ ary Discharge Instructions: Apply to dried area at top Secondary Dressing: Woven Gauze Sponge, Non-Sterile 4x4 in 1 x Per Week/ Discharge Instructions: Apply over primary dressing as directed. Secondary Dressing: ABD Pad, 8x10 1 x Per Week/ Discharge Instructions: Apply over primary dressing as directed. Com pression Wrap: ThreePress (3 layer compression wrap) 1 x Per Week/ Discharge Instructions: Apply three layer compression as directed. Com pression Wrap: Unnaboot w/Calamine, 4x10 (in/yd) 1 x Per Week/ Discharge Instructions: Apply at top to anchor wrap 1. Keflex 2. Hydrofera Blue and Xeroform under 3 layer compression 3. Follow-up in 1 week Electronic Signature(s) Signed: 04/27/2021 10:48:54 AM By: Geralyn CorwinHoffman, Cornella Emmer DO Entered By: Geralyn CorwinHoffman, Rivers Hamrick on 04/27/2021 10:47:54 -------------------------------------------------------------------------------- HxROS Details Patient Name: Date of Service: Catherine HockBRIGMA N, [name] 04/27/2021 9:00 A M Medical Record Number: [id number] Patient Account Number: [id number] Date of Birth/Sex: Treating RN: 03/26/1965 (56 y.o. Catherine Powell StandardF) Boehlein, Linda [facility] [place] [facility] Other Clinician: Referring Provider: Treating Provider/Extender: Harland GermanHoffman, Saleha Kalp [place] LSH, TA [place] Weeks in Treatment: 1 Information Obtained From Patient Hematologic/Lymphatic Medical History: Positive for: Lymphedema Past Medical History Notes: Hypercholesterolemia Respiratory Medical History: Positive for: Asthma Cardiovascular Medical History: Positive for: Hypertension; Peripheral Venous Disease Gastrointestinal Medical History: Past Medical History Notes: GERD Musculoskeletal Medical History: Positive for: Osteoarthritis Past Medical History Notes: Degenerative joint disease of knees Immunizations Pneumococcal Vaccine: Received Pneumococcal Vaccination: No Implantable Devices No devices added Hospitalization / Surgery History Type of  Hospitalization/Surgery knee right arthoscopy appendectomy cardiac cath cervical fusion cholecystectomy wisdom tooth Family and Social History Unknown History: Yes; Never smoker; Marital Status - Married; Alcohol Use: Never; Drug Use: No History; Caffeine Use: Daily - coffee; Financial Concerns: No; Food, Clothing or Shelter Needs: No; Support System Lacking: No; Transportation Concerns: No Electronic Signature(s) Signed: 04/27/2021 10:48:54 AM By: Geralyn CorwinHoffman, Gwen Edler DO Signed: 04/30/2021 5:43:45 PM By: Zenaida DeedBoehlein, Linda RN, BSN Entered By: Geralyn CorwinHoffman, Bert Givans on 04/27/2021 10:43:58 -------------------------------------------------------------------------------- SuperBill Details Patient Name: Date of Service: Catherine FlakesBRIGMA N, [name] 04/27/2021 Medical Record Number: [id number] Patient Account Number: [id number] Date of Birth/Sex: Treating RN: 08/24/1965 (56 y.o. Catherine CluckF) Barnhart, Jodi [facility] [place] Other Clinician: Referring Provider: Treating Provider/Extender: Harland GermanHoffman, Yatzary Merriweather [place] LSH, TA [place] Weeks in Treatment: 1 Diagnosis Coding ICD-10 Codes Code Description [id number] Non-pressure chronic ulcer of other part of right lower leg with fat layer exposed I89.0 Lymphedema, not elsewhere classified Z99.3 Dependence on wheelchair E66.01 Morbid (severe) obesity due to excess calories L03.115 Cellulitis of right lower limb Facility Procedures  CPT4 Code: [id number] Description: (Facility Use Only) [phone] - APPLY MULTLAY COMPRS LWR RT LEG ICD-10 Diagnosis Description L97.812 Non-pressure chronic ulcer of other part of right lower leg with fat layer exposed Modifier: Quantity: 1 Physician Procedures : CPT4 Code Description Modifier [id number] - WC PHYS LEVEL 4 - EST PT ICD-10 Diagnosis Description L03.115 Cellulitis of right lower limb L97.812 Non-pressure chronic ulcer of other part of right lower leg with fat layer exposed I89.0 Lymphedema, not  elsewhere classified E66.01  Morbid (severe) obesity due to excess calories Quantity: 1 Electronic Signature(s) Signed: 04/27/2021 10:48:54 AM By: Geralyn CorwinHoffman, Tobi Groesbeck DO Entered By: Geralyn CorwinHoffman, Akayla Brass on 04/27/2021 10:48:27

## 2021-05-01 DIAGNOSIS — L97812 Non-pressure chronic ulcer of other part of right lower leg with fat layer exposed: Secondary | ICD-10-CM | POA: Diagnosis not present

## 2021-05-01 NOTE — Telephone Encounter (Signed)
yes

## 2021-05-01 NOTE — Telephone Encounter (Signed)
Can you please refax papers?  Thank you

## 2021-05-04 ENCOUNTER — Encounter (HOSPITAL_BASED_OUTPATIENT_CLINIC_OR_DEPARTMENT_OTHER): Payer: BC Managed Care – PPO | Admitting: Internal Medicine

## 2021-05-04 ENCOUNTER — Other Ambulatory Visit: Payer: Self-pay

## 2021-05-04 DIAGNOSIS — I890 Lymphedema, not elsewhere classified: Secondary | ICD-10-CM

## 2021-05-04 DIAGNOSIS — B3731 Acute candidiasis of vulva and vagina: Secondary | ICD-10-CM | POA: Diagnosis not present

## 2021-05-04 DIAGNOSIS — J45909 Unspecified asthma, uncomplicated: Secondary | ICD-10-CM | POA: Diagnosis not present

## 2021-05-04 DIAGNOSIS — I739 Peripheral vascular disease, unspecified: Secondary | ICD-10-CM | POA: Diagnosis not present

## 2021-05-04 DIAGNOSIS — E7800 Pure hypercholesterolemia, unspecified: Secondary | ICD-10-CM | POA: Diagnosis not present

## 2021-05-04 DIAGNOSIS — I10 Essential (primary) hypertension: Secondary | ICD-10-CM | POA: Diagnosis not present

## 2021-05-04 DIAGNOSIS — Z6841 Body Mass Index (BMI) 40.0 and over, adult: Secondary | ICD-10-CM | POA: Diagnosis not present

## 2021-05-04 DIAGNOSIS — K219 Gastro-esophageal reflux disease without esophagitis: Secondary | ICD-10-CM | POA: Diagnosis not present

## 2021-05-04 DIAGNOSIS — L97812 Non-pressure chronic ulcer of other part of right lower leg with fat layer exposed: Secondary | ICD-10-CM

## 2021-05-04 DIAGNOSIS — M170 Bilateral primary osteoarthritis of knee: Secondary | ICD-10-CM | POA: Diagnosis not present

## 2021-05-04 DIAGNOSIS — Z993 Dependence on wheelchair: Secondary | ICD-10-CM | POA: Diagnosis not present

## 2021-05-04 NOTE — Progress Notes (Addendum)
Catherine, Powell ([id number]) Visit Report for 05/04/2021 Chief Complaint Document Details Patient Name: Date of Service: Catherine Powell 05/04/2021 10:45 A M Medical Record Number: [id number] Patient Account Number: [id number] Date of Birth/Sex: Treating RN: 1965/11/24 (56 y.o. F) Primary Care Provider: [facility], [place] Other Clinician: Referring Provider: Treating Provider/Extender: Harland German LSH, [place] Weeks in Treatment: 2 Information Obtained from: Patient Chief Complaint Right posterior leg wound Electronic Signature(s) Signed: 05/04/2021 11:53:38 AM By: Geralyn Corwin DO Entered By: Geralyn Corwin on 05/04/2021 11:46:32 -------------------------------------------------------------------------------- HPI Details Patient Name: Date of Service: Catherine Powell. 05/04/2021 10:45 A M Medical Record Number: [id number] Patient Account Number: [id number] Date of Birth/Sex: Treating RN: 12-03-1965 (56 y.o. F) Primary Care Provider: [facility], [place] Other Clinician: Referring Provider: Treating Provider/Extender: Harland German LSH, [place] Weeks in Treatment: 2 History of Present Illness HPI Description: Admission 04/20/2021 Ms. Catherine Powell is a 56 year old female with a past medical history of morbid obesity with BMI of 66 and wheelchair dependent due to this, lymphedema and hypertension that presents to the clinic for a 97-month history of nonhealing ulcer to the right calf. She states this started spontaneously and has been using Unna boots and Xeroform with benefit She saw vein and vascular who recommended bariatric surgery. She currently denies systemic signs of infection. 2/17; patient presents for follow-up. She tolerated the compression wrap well. She does report increased tenderness to the surrounding wound bed. 2/24; patient presents for follow-up. She reports taking Keflex with improvement in her symptoms. She has developed some vaginal itching and  reports a history of yeast infection after taking antibiotics. She has her juxta lite compression today. Electronic Signature(s) Signed: 05/04/2021 11:53:38 AM By: Geralyn Corwin DO Entered By: Geralyn Corwin on 05/04/2021 11:47:16 -------------------------------------------------------------------------------- Physical Exam Details Patient Name: Date of Service: Catherine Powell 05/04/2021 10:45 A M Medical Record Number: [id number] Patient Account Number: [id number] Date of Birth/Sex: Treating RN: 16-Nov-1965 (56 y.o. F) Primary Care Provider: [facility], [place] Other Clinician: Referring Provider: Treating Provider/Extender: Harland German LSH, [place] Weeks in Treatment: 2 Constitutional respirations regular, non-labored and within target range for patient.Marland Kitchen Psychiatric pleasant and cooperative. Notes Right lower extremity: T the posterior aspect there is epithelization to the previous wound site. No surrounding signs of infection. Nonpitting edema to the o knee. Lymphedema skin changes. Electronic Signature(s) Signed: 05/04/2021 11:53:38 AM By: Geralyn Corwin DO Entered By: Geralyn Corwin on 05/04/2021 11:47:51 -------------------------------------------------------------------------------- Physician Orders Details Patient Name: Date of Service: Catherine Powell 05/04/2021 10:45 A M Medical Record Number: [id number] Patient Account Number: [id number] Date of Birth/Sex: Treating RN: 1965-09-30 (56 y.o. Catherine Powell Primary Care Provider: [facility], [place] Other Clinician: Referring Provider: Treating Provider/Extender: Harland German LSH, [place] Weeks in Treatment: 2 Verbal / Phone Orders: No Diagnosis Coding ICD-10 Coding Code Description [phone] Non-pressure chronic ulcer of other part of right lower leg with fat layer exposed I89.0 Lymphedema, not elsewhere classified Z99.3 Dependence on wheelchair E66.01 Morbid (severe) obesity due to excess  calories Discharge From [facility] Discharge from Wound Care Center - Call if any future wound care needs. Edema Control - Lymphedema / SCD / Other Elevate legs to the level of the heart or above for 30 minutes daily and/or when sitting, a frequency of: - throughout the day. Exercise regularly Moisturize legs daily. - apply both legs every night before bed. Compression stocking or Garment 20-30 mm/Hg  pressure to: - Apply juxtalite HD in the morning and remove at night to both legs. Patient Medications llergies: tolmetin sodium A Notifications Medication Indication Start End 05/04/2021 Diflucan DOSE 1 - oral 150 mg tablet - 1 tablet oral Electronic Signature(s) Signed: 05/04/2021 11:53:38 AM By: Geralyn Corwin DO Previous Signature: 05/04/2021 11:49:55 AM Version By: Geralyn Corwin DO Entered By: Geralyn Corwin on 05/04/2021 11:50:11 -------------------------------------------------------------------------------- Problem List Details Patient Name: Date of Service: Catherine Powell. 05/04/2021 10:45 A M Medical Record Number: [id number] Patient Account Number: [id number] Date of Birth/Sex: Treating RN: 1965/12/06 (56 y.o. Catherine Powell Primary Care Provider: [facility], [place] [facility] Other Clinician: Referring Provider: Treating Provider/Extender: Harland German LSH, [place] Weeks in Treatment: 2 Active Problems ICD-10 Encounter Code Description Active Date MDM Diagnosis [phone] Non-pressure chronic ulcer of other part of right lower leg with fat layer 04/20/2021 No Yes exposed I89.0 Lymphedema, not elsewhere classified 04/20/2021 No Yes Z99.3 Dependence on wheelchair 04/20/2021 No Yes E66.01 Morbid (severe) obesity due to excess calories 04/20/2021 No Yes B37.31 Acute candidiasis of vulva and vagina 05/04/2021 No Yes Inactive Problems Resolved Problems Electronic Signature(s) Signed: 05/04/2021 11:53:38 AM By: Geralyn Corwin DO Entered By: Geralyn Corwin on 05/04/2021  11:46:07 -------------------------------------------------------------------------------- Progress Note Details Patient Name: Date of Service: Catherine Powell. 05/04/2021 10:45 A M Medical Record Number: [id number] Patient Account Number: [id number] Date of Birth/Sex: Treating RN: [birth year] (56 y.o. F) Primary Care Provider: [facility], [place] Other Clinician: Referring Provider: Treating Provider/Extender: Harland German LSH, [place] Weeks in Treatment: 2 Subjective Chief Complaint Information obtained from Patient Right posterior leg wound History of Present Illness (HPI) Admission 04/20/2021 Ms. Terren Haberle is a 56 year old female with a past medical history of morbid obesity with BMI of 66 and wheelchair dependent due to this, lymphedema and hypertension that presents to the clinic for a 82-month history of nonhealing ulcer to the right calf. She states this started spontaneously and has been using Unna boots and Xeroform with benefit She saw vein and vascular who recommended bariatric surgery. She currently denies systemic signs of infection. 2/17; patient presents for follow-up. She tolerated the compression wrap well. She does report increased tenderness to the surrounding wound bed. 2/24; patient presents for follow-up. She reports taking Keflex with improvement in her symptoms. She has developed some vaginal itching and reports a history of yeast infection after taking antibiotics. She has her juxta lite compression today. Patient History Information obtained from Patient. Family History Unknown History. Social History Never smoker, Marital Status - Married, Alcohol Use - Never, Drug Use - No History, Caffeine Use - Daily - coffee. Medical History Hematologic/Lymphatic Patient has history of Lymphedema Respiratory Patient has history of Asthma Cardiovascular Patient has history of Hypertension, Peripheral Venous Disease Musculoskeletal Patient has history of  Osteoarthritis Hospitalization/Surgery History - knee right arthoscopy. - appendectomy. - cardiac cath. - cervical fusion. - cholecystectomy. - wisdom tooth. Medical A Surgical History Notes nd Hematologic/Lymphatic Hypercholesterolemia Gastrointestinal GERD Musculoskeletal Degenerative joint disease of knees Objective Constitutional respirations regular, non-labored and within target range for patient.. Vitals Time Taken: 11:22 AM, Height: 68 in, Weight: 430 lbs, BMI: 65.4, Temperature: 97.8 F, Pulse: 75 bpm, Respiratory Rate: 22 breaths/min, Blood Pressure: 173/74 mmHg. Psychiatric pleasant and cooperative. General Notes: Right lower extremity: T the posterior aspect there is epithelization to the previous wound site. No surrounding signs of infection. Nonpitting o edema to the knee. Lymphedema skin changes. Integumentary (Hair, Skin) Wound #1 status is  Healed - Epithelialized. Original cause of wound was Gradually Appeared. The date acquired was: 01/07/2021. The wound has been in treatment 2 weeks. The wound is located on the Right,Posterior Lower Leg. The wound measures 0cm length x 0cm width x 0cm depth; 0cm^2 area and 0cm^3 volume. There is Fat Layer (Subcutaneous Tissue) exposed. There is a medium amount of serosanguineous drainage noted. The wound margin is distinct with the outline attached to the wound base. There is large (67-100%) pink granulation within the wound bed. There is a small (1-33%) amount of necrotic tissue within the wound bed. Assessment Active Problems ICD-10 Non-pressure chronic ulcer of other part of right lower leg with fat layer exposed Lymphedema, not elsewhere classified Dependence on wheelchair Morbid (severe) obesity due to excess calories Acute candidiasis of vulva and vagina Patient has done well with Hydrofera Blue under 3 layer compression. Her wound is healed. At this time we will transition her to a juxta lite compression. I recommended  she use this daily. I recommended she continue to elevate her legs. 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 I recommended she stay out Of workfor 1 more week to help with continued wound healing. We gave her a note to return on March 7. She completed a course of Keflex for cellulitis of the right lower extremity. She reports some vaginal itching. I will give her Diflucan for this one-time dose. She needs to follow-up with her PCP if this does not improve. Plan Discharge From [facility] Services: Discharge from Wound Care Center - Call if any future wound care needs. Edema Control - Lymphedema / SCD / Other: Elevate legs to the level of the heart or above for 30 minutes daily and/or when sitting, a frequency of: - throughout the day. Exercise regularly Moisturize legs daily. - apply both legs every night before bed. Compression stocking or Garment 20-30 mm/Hg pressure to: - Apply juxtalite HD in the morning and remove at night to both legs. The following medication(s) was prescribed: Diflucan oral 150 mg tablet 1 1 tablet oral starting 05/04/2021 1. Juxta light compression daily 2. Diflucan 3. Follow-up as needed Electronic Signature(s) Signed: 05/04/2021 11:53:38 AM By: Geralyn Corwin DO Entered By: Geralyn Corwin on 05/04/2021 11:53:00 -------------------------------------------------------------------------------- HxROS Details Patient Name: Date of Service: Catherine Powell. 05/04/2021 10:45 A M Medical Record Number: [id number] Patient Account Number: [id number] Date of Birth/Sex: Treating RN: 06-Aug-1965 (56 y.o. F) Primary Care Provider: [facility], [place] Other Clinician: Referring Provider: Treating Provider/Extender: Harland German LSH, [place] Weeks in Treatment: 2 Information Obtained From Patient Hematologic/Lymphatic Medical History: Positive for: Lymphedema Past Medical History Notes: Hypercholesterolemia Respiratory Medical History: Positive  for: Asthma Cardiovascular Medical History: Positive for: Hypertension; Peripheral Venous Disease Gastrointestinal Medical History: Past Medical History Notes: GERD Musculoskeletal Medical History: Positive for: Osteoarthritis Past Medical History Notes: Degenerative joint disease of knees Immunizations Pneumococcal Vaccine: Received Pneumococcal Vaccination: No Implantable Devices No devices added Hospitalization / Surgery History Type of Hospitalization/Surgery knee right arthoscopy appendectomy cardiac cath cervical fusion cholecystectomy wisdom tooth Family and Social History Unknown History: Yes; Never smoker; Marital Status - Married; Alcohol Use: Never; Drug Use: No History; Caffeine Use: Daily - coffee; Financial Concerns: No; Food, Clothing or Shelter Needs: No; Support System Lacking: No; Transportation Concerns: No Electronic Signature(s) Signed: 05/04/2021 11:53:38 AM By: Geralyn Corwin DO Entered By: Geralyn Corwin on 05/04/2021 11:47:23 -------------------------------------------------------------------------------- SuperBill Details Patient Name: Date of Service: Sherrlyn Hock  Rexene EdisonH. 05/04/2021 Medical Record Number: [id number] Patient Account Number: [id number] Date of Birth/Sex: Treating RN: 01/21/1966 (56 y.o. F) [facility] [place] Other Clinician: Referring Provider: Treating Provider/Extender: Harland GermanHoffman, [place] Catarino [place] LSH, TA [place] Weeks in Treatment: 2 Diagnosis Coding ICD-10 Codes Code Description [phone] Non-pressure chronic ulcer of other part of right lower leg with fat layer exposed I89.0 Lymphedema, not elsewhere classified Z99.3 Dependence on wheelchair E66.01 Morbid (severe) obesity due to excess calories B37.31 Acute candidiasis of vulva and vagina Facility Procedures CPT4 Code: [id number] Description: 99213 - WOUND CARE VISIT-LEV 3 EST PT Modifier: Quantity: 1 Physician Procedures : CPT4 Code Description Modifier  [id number] 99214 - WC PHYS LEVEL 4 - EST PT ICD-10 Diagnosis Description L97.812 Non-pressure chronic ulcer of other part of right lower leg with fat layer exposed I89.0 Lymphedema, not elsewhere classified B37.31 Acute  candidiasis of vulva and vagina E66.01 Morbid (severe) obesity due to excess calories Quantity: 1 Electronic Signature(s) Signed: 05/04/2021 12:17:02 PM By: Geralyn CorwinHoffman, Toyna Erisman DO Signed: 05/04/2021 1:00:21 PM By: Shawn Stalleaton, [name] RN, BSN Previous Signature: 05/04/2021 11:53:38 AM Version By: Geralyn CorwinHoffman, Abhimanyu Cruces DO Entered By: Shawn Stalleaton, [name] on 05/04/2021 12:14:09

## 2021-05-07 ENCOUNTER — Other Ambulatory Visit: Payer: Self-pay | Admitting: Family Medicine

## 2021-05-07 DIAGNOSIS — L03115 Cellulitis of right lower limb: Secondary | ICD-10-CM

## 2021-05-08 NOTE — Progress Notes (Signed)
Catherine Powell, Alonah H. ([id number]) Visit Report for 05/04/2021 Arrival Information Details Patient Name: Date of Service: Catherine Powell, Catherine ERLY H. 05/04/2021 10:45 A M Medical Record Number: [id number] Patient Account Number: [id number] Date of Birth/Sex: Treating RN: 06/24/1965 (56 y.o. F) [facility] Bonneau[facility], [place] Other Clinician: Referring Chasiti Waddington: Treating Emmer Lillibridge/Extender: Harland GermanHoffman, Jessica [place] LSH, [place] Weeks in Treatment: 2 Visit Information History Since Last Visit Added or deleted any medications: No Patient Arrived: Wheel Chair Any new allergies or adverse reactions: No Arrival Time: 11:21 Had a fall or experienced change in No Accompanied By: son activities of daily living that may affect Transfer Assistance: Manual risk of falls: Patient Identification Verified: Yes Signs or symptoms of abuse/neglect since last visito No Secondary Verification Process Completed: Yes Hospitalized since last visit: No Patient Requires Transmission-Based Precautions: No Implantable device outside of the clinic excluding No Patient Has Alerts: No cellular tissue based products placed in the center since last visit: Has Dressing in Place as Prescribed: Yes Pain Present Now: Yes Electronic Signature(s) Signed: 05/08/2021 7:53:36 AM By: Karl Itoawkins, Destiny Entered By: Karl Itoawkins, Destiny on 05/04/2021 11:21:48 -------------------------------------------------------------------------------- Clinic Level of Care Assessment Details Patient Name: Date of Service: Catherine Powell, Catherine ERLY H. 05/04/2021 10:45 A M Medical Record Number: [id number] Patient Account Number: [id number] Date of Birth/Sex: Treating RN: 08/31/1965 (56 y.o. Arta SilenceF) [name], [name] Primary Care Javares Kaufhold: [facility], [place] Other Clinician: Referring Naylin Burkle: Treating Tavious Griesinger/Extender: Harland GermanHoffman, Jessica [place] LSH, TA [place] Weeks in Treatment: 2 Clinic Level of Care Assessment Items TOOL 4 Quantity Score X- 1 0 Use when only an EandM is  performed on FOLLOW-UP visit ASSESSMENTS - Nursing Assessment / Reassessment X- 1 10 Reassessment of Co-morbidities (includes updates in patient status) X- 1 5 Reassessment of Adherence to Treatment Plan ASSESSMENTS - Wound and Skin A ssessment / Reassessment X - Simple Wound Assessment / Reassessment - one wound 1 5 []  - 0 Complex Wound Assessment / Reassessment - multiple wounds X- 1 10 Dermatologic / Skin Assessment (not related to wound area) ASSESSMENTS - Focused Assessment X- 1 5 Circumferential Edema Measurements - multi extremities X- 1 10 Nutritional Assessment / Counseling / Intervention []  - 0 Lower Extremity Assessment (monofilament, tuning fork, pulses) []  - 0 Peripheral Arterial Disease Assessment (using hand held doppler) ASSESSMENTS - Ostomy and/or Continence Assessment and Care []  - 0 Incontinence Assessment and Management []  - 0 Ostomy Care Assessment and Management (repouching, etc.) PROCESS - Coordination of Care X - Simple Patient / Family Education for ongoing care 1 15 []  - 0 Complex (extensive) Patient / Family Education for ongoing care X- 1 10 Staff obtains ChiropractorConsents, Records, T Results / Process Orders est []  - 0 Staff telephones HHA, Nursing Homes / Clarify orders / etc []  - 0 Routine Transfer to another Facility (non-emergent condition) []  - 0 Routine Hospital Admission (non-emergent condition) []  - 0 New Admissions / Manufacturing engineernsurance Authorizations / Ordering NPWT Apligraf, etc. , []  - 0 Emergency Hospital Admission (emergent condition) X- 1 10 Simple Discharge Coordination []  - 0 Complex (extensive) Discharge Coordination PROCESS - Special Needs []  - 0 Pediatric / Minor Patient Management []  - 0 Isolation Patient Management []  - 0 Hearing / Language / Visual special needs []  - 0 Assessment of Community assistance (transportation, D/C planning, etc.) []  - 0 Additional assistance / Altered mentation []  - 0 Support Surface(s) Assessment  (bed, cushion, seat, etc.) INTERVENTIONS - Wound Cleansing / Measurement X - Simple Wound Cleansing - one wound 1 5 []  -  0 Complex Wound Cleansing - multiple wounds X- 1 5 Wound Imaging (photographs - any number of wounds) []  - 0 Wound Tracing (instead of photographs) X- 1 5 Simple Wound Measurement - one wound []  - 0 Complex Wound Measurement - multiple wounds INTERVENTIONS - Wound Dressings []  - 0 Small Wound Dressing one or multiple wounds []  - 0 Medium Wound Dressing one or multiple wounds []  - 0 Large Wound Dressing one or multiple wounds []  - 0 Application of Medications - topical []  - 0 Application of Medications - injection INTERVENTIONS - Miscellaneous []  - 0 External ear exam []  - 0 Specimen Collection (cultures, biopsies, blood, body fluids, etc.) []  - 0 Specimen(s) / Culture(s) sent or taken to Lab for analysis []  - 0 Patient Transfer (multiple staff / / Similar devices) []  - 0 Simple Staple / Suture removal (25 or less) []  - 0 Complex Staple / Suture removal (26 or more) []  - 0 Hypo / Hyperglycemic Management (close monitor of Blood Glucose) []  - 0 Ankle / Brachial Index (ABI) - do not check if billed separately X- 1 5 Vital Signs Has the patient been seen at the hospital within the last three years: Yes Total Score: 100 Level Of Care: New/Established - Level 3 Electronic Signature(s) Signed: 05/04/2021 1:00:21 PM By: RN, BSN Entered By: on 05/04/2021 12:14:01 -------------------------------------------------------------------------------- Encounter Discharge Information Details Patient Name: Date of Service: . 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 Work note provided by Kikue Gerhart for patient to return to work on Monday May 15, 2021. Electronic Signature(s) Signed: 05/04/2021 1:00:21 PM By: Shawn Stall RN, BSN Entered By: 05/06/2021 on 05/04/2021 12:15:27 -------------------------------------------------------------------------------- Lower Extremity Assessment Details Patient Name: Date of Service: 05/06/2021 05/04/2021 10:45 A M Medical Record Number: [id number] Patient Account Number: 13/09/1965 Date of Birth/Sex: Treating RN: December 17, 1965 (56 y.o. [facility] Primary Care Suzannah Bettes: [facility], Harland German [place] Other Clinician: Referring Sugar Vanzandt: Treating Breck [place]/Extender: Thursday LSH, TA [place] Weeks in Treatment: 2 Edema Assessment Assessed: [Left: No] [Right: Yes] Edema: [Left: Yes] [Right: Yes] Calf Left: Right: Point of Measurement: From Medial Instep 74 cm Ankle Left: Right: Point of Measurement: From Medial Instep 33 cm Vascular Assessment Pulses: Dorsalis Pedis Palpable: [Right:No] Electronic Signature(s) Signed: 05/04/2021 1:00:21 PM By: 05/06/2021 RN, BSN Entered By: Shawn Stall on 05/04/2021 11:29:11 -------------------------------------------------------------------------------- Multi Wound Chart Details Patient Name: Date of Service: 05/06/2021. 05/04/2021 10:45 A M Medical Record Number: 05/06/2021 Patient Account Number: [id number] Date of Birth/Sex: Treating RN: 1965-11-23 (56 y.o. F) Primary Care Loreena Valeri: [facility], [name] [place] Other Clinician: Referring Ruthanna Macchia: Treating Lashay Osborne/Extender: [facility] LSH,  [place] Weeks in Treatment: 2 Vital Signs Height(in): 68 Pulse(bpm): 75 Weight(lbs): 430 Blood Pressure(mmHg): 173/74 Body Mass Index(BMI): 65.4 Temperature(F): 97.8 Respiratory Rate(breaths/min): 22 Photos: [Powell/A:Powell/A] Right, Posterior Lower Leg Powell/A Powell/A Wound Location: Gradually Appeared Powell/A Powell/A Wounding Event: Lymphedema Powell/A Powell/A Primary Etiology: Lymphedema, Asthma, Hypertension, Powell/A Powell/A Comorbid  History: Peripheral Venous Disease, Osteoarthritis 01/07/2021 Powell/A Powell/A Date Acquired: 2 Powell/A Powell/A Weeks of Treatment: Healed - Epithelialized Powell/A Powell/A Wound Status: No Powell/A Powell/A Wound Recurrence: 0x0x0 Powell/A Powell/A Measurements L x W x D (cm) 0 Powell/A Powell/A A (cm) : rea 0 Powell/A Powell/A Volume (cm) : 100.00% Powell/A Powell/A % Reduction in Area: 100.00% Powell/A Powell/A % Reduction in Volume: Full Thickness Without Exposed Powell/A Powell/A Classification: Support Structures Medium Powell/A Powell/A Exudate Amount: Serosanguineous Powell/A Powell/A Exudate Type: red, brown Powell/A Powell/A Exudate Color: Distinct, outline attached Powell/A Powell/A Wound Margin: Large (67-100%) Powell/A Powell/A Granulation Amount: Pink Powell/A Powell/A Granulation Quality: Small (1-33%) Powell/A Powell/A Necrotic Amount: Fat Layer (Subcutaneous Tissue): Yes Powell/A Powell/A Exposed Structures: Fascia: No Tendon: No Muscle: No Joint: No Bone: No Small (1-33%) Powell/A Powell/A Epithelialization: Treatment Notes Electronic Signature(s) Signed: 05/04/2021 11:53:38 AM By: Geralyn Corwin DO Entered By: Geralyn Corwin on 05/04/2021 11:46:20 -------------------------------------------------------------------------------- Multi-Disciplinary Care Plan Details Patient Name: Date of Service: Catherine Hock H. 05/04/2021 10:45 A M Medical Record Number: [id number] Patient Account Number: [id number] Date of Birth/Sex: Treating RN: 08-04-1965 (56 y.o. [name] Primary Care Kevonta Phariss: [facility], [place] Other Clinician: Referring Trystyn Dolley: Treating Kjirsten Bloodgood/Extender: Harland German LSH, [place]  [place] Weeks in Treatment: 2 Multidisciplinary Care Plan reviewed with physician Active Inactive Electronic Signature(s) Signed: 05/04/2021 1:00:21 PM By: Shawn Stall RN, BSN Entered By: Shawn Stall on 05/04/2021 12:13:17 -------------------------------------------------------------------------------- Pain Assessment Details Patient Name: Date of Service: Catherine Powell 05/04/2021 10:45 A M Medical Record Number: [id number] Patient Account Number: [id number] Date of Birth/Sex: Treating RN: December 17, 1965 (56 y.o. F) Primary Care Leidy Massar: [facility], [place] Other Clinician: Referring Kamaya Keckler: Treating Orma Cheetham/Extender: Harland German LSH, [place] Weeks in Treatment: 2 Active Problems Location of Pain Severity and Description of Pain Patient Has Paino Yes Site Locations Rate the pain. Current Pain Level: 4 Pain Management and Medication Current Pain Management: Electronic Signature(s) Signed: 05/08/2021 7:53:36 AM By: Karl Ito Entered By: Karl Ito on 05/04/2021 11:24:48 -------------------------------------------------------------------------------- Patient/Caregiver Education Details Patient Name: Date of Service: Catherine Powell 2/24/2023andnbsp10:45 A M Medical Record Number: [id number] Patient Account Number: [id number] Date of Birth/Gender: Treating RN: 04/15/1965 (56 y.o. [name] Primary Care Physician: [facility], [place] Other Clinician: Referring Physician: Treating Physician/Extender: Harland German LSH, [place] Weeks in Treatment: 2 Education Assessment Education Provided To: Patient Education Topics Provided Safety: Handouts: Safety Methods: Explain/Verbal Responses: Reinforcements needed Electronic Signature(s) Signed: 05/04/2021 1:00:21 PM By: Shawn Stall RN, BSN Entered By: Shawn Stall on 05/04/2021 11:32:47 -------------------------------------------------------------------------------- Wound Assessment  Details Patient Name: Date of Service: Catherine Powell. 05/04/2021 10:45 A M Medical Record Number: [id number] Patient Account Number: [id number] Date of Birth/Sex: Treating RN: 21-Apr-1965 (56 y.o. [name] Primary Care Masami Plata: [facility], [place] Other Clinician: Referring Jeannette Maddy: Treating Kristen Bushway/Extender: Harland German LSH, TA [place] Weeks in Treatment: 2 Wound Status Wound Number: 1 Primary Lymphedema Etiology: Wound Location: Right, Posterior Lower Leg Wound Healed - Epithelialized Wounding Event: Gradually Appeared Status: Date Acquired: 01/07/2021 Comorbid Lymphedema, Asthma, Hypertension, Peripheral Venous Weeks Of Treatment: 2 History: Disease, Osteoarthritis Clustered Wound: No Photos Wound Measurements Length: (cm) Width: (cm) Depth: (cm) Area: (cm) Volume: (cm) 0 % Reduction in Area: 100% 0 % Reduction in Volume: 100% 0 Epithelialization: Small (1-33%) 0 0 Wound Description Classification: Full Thickness Without Exposed Support Structures Wound Margin: Distinct, outline attached Exudate Amount: Medium Exudate Type: Serosanguineous Exudate Color: red,  brown Foul Odor After Cleansing: No Slough/Fibrino Yes Wound Bed Granulation Amount: Large (67-100%) Exposed Structure Granulation Quality: Pink Fascia Exposed: No Necrotic Amount: Small (1-33%) Fat Layer (Subcutaneous Tissue) Exposed: Yes Tendon Exposed: No Muscle Exposed: No Joint Exposed: No Bone Exposed: No Electronic Signature(s) Signed: 05/04/2021 1:00:21 PM By: Shawn Stall RN, BSN Entered By: Shawn Stall on 05/04/2021 11:42:02 -------------------------------------------------------------------------------- Vitals Details Patient Name: Date of Service: Catherine Hock H. 05/04/2021 10:45 A M Medical Record Number: [id number] Patient Account Number: [id number] Date of Birth/Sex: Treating RN: [birth year] (56 y.o. F) Primary Care Trust Leh: [facility], [place] Other  Clinician: Referring Deuce Paternoster: Treating Ciria Bernardini/Extender: Harland German LSH, [place] Weeks in Treatment: 2 Vital Signs Time Taken: 11:22 Temperature (F): 97.8 Height (in): 68 Pulse (bpm): 75 Weight (lbs): 430 Respiratory Rate (breaths/min): 22 Body Mass Index (BMI): 65.4 Blood Pressure (mmHg): 173/74 Reference Range: 80 - 120 mg / dl Electronic Signature(s) Signed: 05/08/2021 7:53:36 AM By: Karl Ito Entered By: Karl Ito on 05/04/2021 11:24:40

## 2021-05-11 ENCOUNTER — Encounter: Payer: Self-pay | Admitting: Family Medicine

## 2021-05-11 ENCOUNTER — Other Ambulatory Visit: Payer: Self-pay | Admitting: Family Medicine

## 2021-05-11 DIAGNOSIS — L03115 Cellulitis of right lower limb: Secondary | ICD-10-CM

## 2021-05-11 MED ORDER — AQUAPHOR EX OINT: TOPICAL_OINTMENT | CUTANEOUS | 0 refills | Status: DC | PRN

## 2021-05-11 MED ORDER — HYDROCODONE-ACETAMINOPHEN 5-325 MG PO TABS: 1.0000 | ORAL_TABLET | Freq: Four times a day (QID) | ORAL | 0 refills | Status: DC | PRN

## 2021-05-14 ENCOUNTER — Encounter (HOSPITAL_BASED_OUTPATIENT_CLINIC_OR_DEPARTMENT_OTHER): Payer: BC Managed Care – PPO | Admitting: Internal Medicine

## 2021-05-15 MED ORDER — AQUAPHOR EX OINT: TOPICAL_OINTMENT | CUTANEOUS | 0 refills | Status: DC | PRN

## 2021-05-15 MED ORDER — HYDROCODONE-ACETAMINOPHEN 5-325 MG PO TABS: 1.0000 | ORAL_TABLET | Freq: Four times a day (QID) | ORAL | 0 refills | Status: DC | PRN

## 2021-05-16 ENCOUNTER — Ambulatory Visit (INDEPENDENT_AMBULATORY_CARE_PROVIDER_SITE_OTHER): Payer: BC Managed Care – PPO | Admitting: Family Medicine

## 2021-05-16 ENCOUNTER — Telehealth: Payer: Self-pay

## 2021-05-16 ENCOUNTER — Other Ambulatory Visit: Payer: Self-pay

## 2021-05-16 VITALS — BP 167/78 | HR 89

## 2021-05-16 DIAGNOSIS — M549 Dorsalgia, unspecified: Secondary | ICD-10-CM | POA: Insufficient documentation

## 2021-05-16 DIAGNOSIS — M5489 Other dorsalgia: Secondary | ICD-10-CM | POA: Diagnosis not present

## 2021-05-16 MED ORDER — CYCLOBENZAPRINE HCL 5 MG PO TABS: 5.0000 mg | ORAL_TABLET | Freq: Three times a day (TID) | ORAL | 0 refills | Status: DC | PRN

## 2021-05-16 NOTE — Telephone Encounter (Signed)
Received the following from [facility]: ? ?Hydrocodone-acetamin 5-325 MG is on back order can you switch this to 7.5mg  or (1/2) tablet of 10 mg every 6 hours? ? ?Please send new Rx with any changes. Sunday Spillers, CMA ? ?

## 2021-05-16 NOTE — Assessment & Plan Note (Addendum)
-  acute on chronic pain likely secondary to MSK etiology ?-heat and ice application along with other conservative measures discussed ?-flexeril short course prescribed (30 days) until patient able to see Dr. Clent Ridges given meloxicam allergy per chart review  ?-work note provided for day of clinic visit ?-current DME paperwork in the process per PCP  ?-next visit with PCP 3/20  ?

## 2021-05-16 NOTE — Progress Notes (Signed)
? ? ?  SUBJECTIVE:  ? ?CHIEF COMPLAINT / HPI:  ? ?Patient presents with concern for needing new wheelchair, she is accompanied by her son. Patient returned back to work yesterday using manual wheelchair after having a wound on her back which has resolved. She is needing a power wheelchair. She hurt her back yesterday when she was trying to get in her wheelchair and felt something pulling. She has used meloxicam in the past which has helped improve her pain. Per patient, PCP has ordered the wheelchair and needs doctor's notes. She needs the chair to help her with movement, she believes that if she had this chair she would not have hurt her back yesterday. She is unable to be in her current wheelchair for long periods of time due to comfort which is another reason she is wanting her new chair.  ? ?OBJECTIVE:  ? ?BP (!) 167/78   Pulse 89   LMP 05/15/2012   ?General: Patient in no acute distress. ?CV: RRR, no murmurs or gallops auscultated  ?Resp: CTAB ?Ext: chronic LE edema bilaterally, distal pulses strong and equal bilaterally ?MSK: tenderness along L3-L5 midline, no surrounding erythema or edema noted ?Neuro: wheelchair bound  ? ?Exam limited given patient's mobility limitations ? ?ASSESSMENT/PLAN:  ? ?Back pain ?-acute on chronic pain likely secondary to MSK etiology ?-heat and ice application along with other conservative measures discussed ?-flexeril short course prescribed (30 days) until patient able to see Dr. Clent Ridges given meloxicam allergy per chart review  ?-work note provided for day of clinic visit ?-current DME paperwork in the process per PCP  ?-next visit with PCP 3/20  ?  ? ?Reece Leader, DO ?[facility]  ?

## 2021-05-16 NOTE — Patient Instructions (Addendum)
It was great seeing you today! ? ?Today we discussed your back pain, it looks like Dr. Clent Ridges is in the process of getting that paperwork to you. Please make sure to avoid sudden movements. You may apply ice and heat, alternating between the two. I have prescribed flexiril, you may take this up to 3 times daily. I have only prescribed you a month's worth until you see Dr. Clent Ridges.  ? ?Please follow up at your next scheduled appointment on 3/20, if anything arises between now and then, please don't hesitate to contact our office. ? ? ?Thank you for allowing [place] to be a part of your medical care! ? ?Thank you, ?Dr. Robyne Peers  ?

## 2021-05-18 ENCOUNTER — Other Ambulatory Visit: Payer: Self-pay | Admitting: Family Medicine

## 2021-05-18 MED ORDER — HYDROCODONE-ACETAMINOPHEN 10-325 MG PO TABS: 0.5000 | ORAL_TABLET | Freq: Four times a day (QID) | ORAL | 0 refills | Status: DC | PRN

## 2021-05-18 NOTE — Telephone Encounter (Signed)
Second fax from [facility] received. Please see message below. Sunday Spillers, CMA ? ?

## 2021-05-21 ENCOUNTER — Telehealth: Payer: Self-pay

## 2021-05-21 ENCOUNTER — Other Ambulatory Visit: Payer: Self-pay | Admitting: Family Medicine

## 2021-05-21 DIAGNOSIS — M545 Low back pain, unspecified: Secondary | ICD-10-CM

## 2021-05-21 DIAGNOSIS — G8929 Other chronic pain: Secondary | ICD-10-CM

## 2021-05-21 MED ORDER — MELOXICAM 7.5 MG PO TABS: 7.5000 mg | ORAL_TABLET | Freq: Every day | ORAL | 0 refills | Status: AC | PRN

## 2021-05-21 NOTE — Telephone Encounter (Signed)
Patient calls nurse line regarding medication side effects to Flexeril. Patient reports that she is experiencing drowsiness on medication and is unable to work due to this side effect.  ? ?Patient is requesting alternative to Flexeril. Patient also reports that she has never had reaction to meloxicam in the past. Patient reports allergy to Tolectin, however, has taken meloxicam in the past with no issues.  ? ?Will forward to PCP and Dr. Robyne Peers for further advisement.  ? ?Veronda Prude, RN ? ?

## 2021-05-21 NOTE — Telephone Encounter (Signed)
Called patient and informed of below.  ? ?Samyukta Cura C Karolee Meloni, RN ? ?

## 2021-05-23 ENCOUNTER — Encounter: Payer: Self-pay | Admitting: Family Medicine

## 2021-05-25 NOTE — Telephone Encounter (Signed)
Please have her sign release of records then fax notes. ? ?[name], MD ?[facility]  ?

## 2021-05-28 ENCOUNTER — Ambulatory Visit (INDEPENDENT_AMBULATORY_CARE_PROVIDER_SITE_OTHER): Payer: BC Managed Care – PPO | Admitting: Family Medicine

## 2021-05-28 ENCOUNTER — Encounter: Payer: Self-pay | Admitting: Family Medicine

## 2021-05-28 ENCOUNTER — Other Ambulatory Visit: Payer: Self-pay

## 2021-05-28 VITALS — BP 133/68 | HR 87 | Ht 68.0 in | Wt >= 6400 oz

## 2021-05-28 DIAGNOSIS — S81801A Unspecified open wound, right lower leg, initial encounter: Secondary | ICD-10-CM

## 2021-05-28 NOTE — Patient Instructions (Addendum)
Thank you for coming to see me today. It was a pleasure. Today we talked about:  ? ?Follow up with wound care as scheduled ? ?Use Bacitracin twice a day for 5 days. ? ?If develop any fevers or worsening pain please call clinic. ? ?Please follow-up with PCP as needed ? ?If you have any questions or concerns, please do not hesitate to call the office at [phone]. ? ?Best,  ? ?[name], MD   ?

## 2021-05-28 NOTE — Progress Notes (Addendum)
? ? ?  SUBJECTIVE:  ? ?CHIEF COMPLAINT / HPI: follow up right leg wound ? ?Right lower leg wound painful again. Was seen by [facility] and area healed. Denies any fevers, drainage or recent trauma.  Has not called [facility] for continued evaluation.   ? ?PERTINENT  PMH / PSH:  ?Chronic lymphedema ?Lipedema ?Obesity class 3 ? ?OBJECTIVE:  ? ?BP 133/68   Pulse 87   Ht 5\' 8"  (1.727 m)   Wt (!) 428 lb 6.4 oz (194.3 kg)   LMP 05/15/2012   SpO2 95%   BMI 65.14 kg/m?   ? ?General: Alert, no acute distress ?Extremities: Chronic lower extremity edema.  Right posterior calf with healing wounds now noted to have two small open areas without drainage but notable for mild surrounding erythema. Tender to touch. ? ? ? ? ? ?ASSESSMENT/PLAN:  ? ?Non-healing wound of right lower extremity ?Wound had healed with [facility].  Since release from services have now developed 2 open areas. Does not appear to be infected at this time.   ?Recommend for patient to follow up with [facility] to prevent further breakdown.  Has appointment scheduled for later this month ?Likely will be a chronic concern and may need [facility]OC[facility] services frequently. ?Can use bacitracin ointment BID as needed ?Monitor for worsening symptoms and if continues will start Keflex 500 mg QID x 5 days. ?Noted provided for work  ?Follow up as needed ?  ? ? ?Carollee Leitz, MD ?[place]  ?

## 2021-05-30 ENCOUNTER — Other Ambulatory Visit: Payer: Self-pay | Admitting: Family Medicine

## 2021-05-30 ENCOUNTER — Encounter: Payer: Self-pay | Admitting: Family Medicine

## 2021-05-30 MED ORDER — CEPHALEXIN 500 MG PO CAPS: 500.0000 mg | ORAL_CAPSULE | Freq: Four times a day (QID) | ORAL | 0 refills | Status: AC

## 2021-05-31 ENCOUNTER — Encounter: Payer: Self-pay | Admitting: Family Medicine

## 2021-05-31 DIAGNOSIS — S81801A Unspecified open wound, right lower leg, initial encounter: Secondary | ICD-10-CM | POA: Insufficient documentation

## 2021-05-31 NOTE — Assessment & Plan Note (Addendum)
Wound had healed with [facility].  Since release from services have now developed 2 open areas. Does not appear to be infected at this time.   ?Recommend for patient to follow up with [facility] to prevent further breakdown.  Has appointment scheduled for later this month ?Likely will be a chronic concern and may need [facility] services frequently. ?Can use bacitracin ointment BID as needed ?Monitor for worsening symptoms and if continues will start Keflex 500 mg QID x 5 days. ?Noted provided for work  ?Follow up as needed ?

## 2021-06-04 ENCOUNTER — Encounter (HOSPITAL_BASED_OUTPATIENT_CLINIC_OR_DEPARTMENT_OTHER): Payer: BC Managed Care – PPO | Attending: Internal Medicine | Admitting: Internal Medicine

## 2021-06-04 ENCOUNTER — Other Ambulatory Visit: Payer: Self-pay

## 2021-06-04 DIAGNOSIS — I10 Essential (primary) hypertension: Secondary | ICD-10-CM | POA: Diagnosis not present

## 2021-06-04 DIAGNOSIS — I87311 Chronic venous hypertension (idiopathic) with ulcer of right lower extremity: Secondary | ICD-10-CM | POA: Diagnosis not present

## 2021-06-04 DIAGNOSIS — I890 Lymphedema, not elsewhere classified: Secondary | ICD-10-CM | POA: Diagnosis not present

## 2021-06-04 DIAGNOSIS — Z993 Dependence on wheelchair: Secondary | ICD-10-CM | POA: Insufficient documentation

## 2021-06-04 DIAGNOSIS — B3731 Acute candidiasis of vulva and vagina: Secondary | ICD-10-CM | POA: Diagnosis not present

## 2021-06-04 DIAGNOSIS — Z6841 Body Mass Index (BMI) 40.0 and over, adult: Secondary | ICD-10-CM | POA: Insufficient documentation

## 2021-06-04 DIAGNOSIS — L97812 Non-pressure chronic ulcer of other part of right lower leg with fat layer exposed: Secondary | ICD-10-CM | POA: Insufficient documentation

## 2021-06-04 NOTE — Progress Notes (Signed)
LEVITA, MONICAL ([id number]) ?Visit Report for 06/04/2021 ?Chief Complaint Document Details ?Patient Name: Date of Service: ?Catherine Powell, Catherine ERLY H. 06/04/2021 2:15 PM ?Medical Record Number: [id number] ?Patient Account Number: [id number] ?Date of Birth/Sex: Treating RN: ?[birth year] (56 y.o. F) [name], [name] ?Primary Care Provider: [facility], [place] Other Clinician: ?Referring Provider: ?Treating Provider/Extender: Geralyn Corwin ?[place] ?Weeks in Treatment: 6 ?Information Obtained from: Patient ?Chief Complaint ?Right posterior leg wound ?Electronic Signature(s) ?Signed: 06/04/2021 3:53:26 PM By: Geralyn Corwin DO ?Entered By: Geralyn Corwin on 06/04/2021 15:39:38 ?-------------------------------------------------------------------------------- ?HPI Details ?Patient Name: Date of Service: ?Catherine Powell, Catherine ERLY H. 06/04/2021 2:15 PM ?Medical Record Number: [id number] ?Patient Account Number: [id number] ?Date of Birth/Sex: Treating RN: ?01-Aug-1965 (56 y.o. F) [name], [name] ?Primary Care Provider: [facility], [place] Other Clinician: ?Referring Provider: ?Treating Provider/Extender: Geralyn Corwin ?[place] ?Weeks in Treatment: 6 ?History of Present Illness ?HPI Description: Admission 04/20/2021 ?Ms. Catherine Powell is a 56 year old female with a past medical history of morbid obesity with BMI of 66 and wheelchair dependent due to this, lymphedema ?and hypertension that presents to the clinic for a 49-month history of nonhealing ulcer to the right calf. She states this started spontaneously and has been ?using Unna boots and Xeroform with benefit She saw vein and vascular who recommended bariatric surgery. She currently denies systemic signs of infection. ?2/17; patient presents for follow-up. She tolerated the compression wrap well. She does report increased tenderness to the surrounding wound bed. ?2/24; patient presents for follow-up. She reports taking Keflex with improvement in her symptoms. She has developed  some vaginal itching and reports a ?history of yeast infection after taking antibiotics. She has her juxta lite compression today. ?3/27; patient presents for follow-up. Patient states that she has had 2 areas open up to the right lower extremity over the past week. She was started on Keflex ?by her primary care physician for potential cellulitis. She has been using her juxta light compression daily without issues. She also has a compression stocking ?to the left lower extremity that she has been using since discharge from clinic on 2/24. ?Electronic Signature(s) ?Signed: 06/04/2021 3:53:26 PM By: Geralyn Corwin DO ?Entered By: Geralyn Corwin on 06/04/2021 15:42:51 ?-------------------------------------------------------------------------------- ?Physical Exam Details ?Patient Name: Date of Service: ?Catherine Powell, Catherine ERLY H. 06/04/2021 2:15 PM ?Medical Record Number: [id number] ?Patient Account Number: [id number] ?Date of Birth/Sex: Treating RN: ?1965-11-28 (56 y.o. F) [name], [name] ?Primary Care Provider: [facility], [place] Other Clinician: ?Referring Provider: ?Treating Provider/Extender: Geralyn Corwin ?[place] ?Weeks in Treatment: 6 ?Constitutional ?respirations regular, non-labored and within target range for patient.Marland Kitchen ?Cardiovascular ?2+ dorsalis pedis/posterior tibialis pulses. ?Psychiatric ?pleasant and cooperative. ?Notes ?Right lower extremity: T the posterior aspect there are 2 areas that are open limited to skin breakdown. No signs of surrounding infection. Nonpitting edema to ?o ?the knee. Lymphedema skin changes noted. ?Left lower extremity: No open wounds however lymphedema skin changes noted throughout. ?Electronic Signature(s) ?Signed: 06/04/2021 3:53:26 PM By: Geralyn Corwin DO ?Entered By: Geralyn Corwin on 06/04/2021 15:43:37 ?-------------------------------------------------------------------------------- ?Physician Orders Details ?Patient Name: Date of Service: ?Catherine Powell, Catherine ERLY H.  06/04/2021 2:15 PM ?Medical Record Number: [id number] ?Patient Account Number: [id number] ?Date of Birth/Sex: Treating RN: ?1965-03-31 (56 y.o. F) [name], [name] ?Primary Care Provider: [facility], [place] Other Clinician: ?Referring Provider: ?Treating Provider/Extender: Geralyn Corwin ?[place] ?Weeks in Treatment: 6 ?Verbal / Phone Orders: No ?Diagnosis Coding ?ICD-10 Coding ?Code Description ?B35.329 Non-pressure chronic ulcer of other  part of right lower leg with fat layer exposed ?I89.0 Lymphedema, not elsewhere classified ?Z99.3 Dependence on wheelchair ?E66.01 Morbid (severe) obesity due to excess calories ?B37.31 Acute candidiasis of vulva and vagina ?Follow-up Appointments ?ppointment in 1 week. - Dr. Mikey Bussing and [place], Room 8 Tuesday 06/12/2021 0815. ?Return A ?Bathing/ Shower/ Hygiene ?May shower with protection but do not get wound dressing(s) wet. ?Edema Control - Lymphedema / SCD / Other ?Lymphedema Pumps. Use Lymphedema pumps on leg(s) 2-3 times a day for 45-60 minutes. If wearing any wraps or hose, do not remove ?them. Continue exercising as instructed. - wound center to order lymphedema pumps from [employer]- someone will call you once approved ?by insurance. ?Elevate legs to the level of the heart or above for 30 minutes daily and/or when sitting, a frequency of: - throughout the day. ?Avoid standing for long periods of time. ?Exercise regularly ?Moisturize legs daily. - apply left leg every night before bed. ?Compression stocking or Garment 20-30 mm/Hg pressure to: - Apply juxtalite HD in the morning and remove at night to left leg. ?Wound Treatment ?Wound #2 - Lower Leg Wound Laterality: Right, Posterior, Proximal ?Cleanser: Soap and Water 1 x Per Week/30 Days ?Discharge Instructions: May shower and wash wound with dial antibacterial soap and water prior to dressing change. ?Cleanser: Wound Cleanser 1 x Per Week/30 Days ?Discharge Instructions: Cleanse the wound with wound cleanser prior to  applying a clean dressing using gauze sponges, not tissue or cotton balls. ?Peri-Wound Care: Sween Lotion (Moisturizing lotion) 1 x Per Week/30 Days ?Discharge Instructions: Apply moisturizing lotion as directed ?Prim Dressing: KerraCel Ag Gelling Fiber Dressing, 2x2 in (silver alginate) 1 x Per Week/30 Days ?ary ?Discharge Instructions: Apply silver alginate to wound bed as instructed ?Secondary Dressing: Woven Gauze Sponge, Non-Sterile 4x4 in 1 x Per Week/30 Days ?Discharge Instructions: Apply over primary dressing as directed. ?Compression Wrap: ThreePress (3 layer compression wrap) 1 x Per Week/30 Days ?Discharge Instructions: Apply three layer compression unna boot first layer applied to keep compression wrap in place. ?Wound #3 - Lower Leg Wound Laterality: Right, Posterior, Distal ?Cleanser: Soap and Water 1 x Per Week/30 Days ?Discharge Instructions: May shower and wash wound with dial antibacterial soap and water prior to dressing change. ?Cleanser: Wound Cleanser 1 x Per Week/30 Days ?Discharge Instructions: Cleanse the wound with wound cleanser prior to applying a clean dressing using gauze sponges, not tissue or cotton balls. ?Peri-Wound Care: Sween Lotion (Moisturizing lotion) 1 x Per Week/30 Days ?Discharge Instructions: Apply moisturizing lotion as directed ?Prim Dressing: KerraCel Ag Gelling Fiber Dressing, 2x2 in (silver alginate) 1 x Per Week/30 Days ?ary ?Discharge Instructions: Apply silver alginate to wound bed as instructed ?Secondary Dressing: Woven Gauze Sponge, Non-Sterile 4x4 in 1 x Per Week/30 Days ?Discharge Instructions: Apply over primary dressing as directed. ?Compression Wrap: ThreePress (3 layer compression wrap) 1 x Per Week/30 Days ?Discharge Instructions: Apply three layer compression unna boot first layer applied to keep compression wrap in place. ?Electronic Signature(s) ?Signed: 06/04/2021 3:53:26 PM By: Geralyn Corwin DO ?Entered By: Geralyn Corwin on 06/04/2021  15:44:26 ?-------------------------------------------------------------------------------- ?Problem List Details ?Patient Name: ?Date of Service: ?Catherine Powell, Catherine ERLY H. 06/04/2021 2:15 PM ?Medical Record Number: [id number]

## 2021-06-04 NOTE — Progress Notes (Signed)
JASA, DUNDON ([id number]) ?Visit Report for 06/04/2021 ?Arrival Information Details ?Patient Name: Date of Service: ?Catherine Powell, Catherine Powell. 06/04/2021 2:15 PM ?Medical Record Number: [id number] ?Patient Account Number: [id number] ?Date of Birth/Sex: Treating RN: ?[birth year] (56 y.o. F) [name], [name] ?Primary Care Chigozie Basaldua: [facility], [place] Other Clinician: ?Referring Yuvia Plant: ?Treating Carolynn Tuley/Extender: Geralyn Corwin ?[place] ?Weeks in Treatment: 6 ?Visit Information History Since Last Visit ?Added or deleted any medications: Yes ?Patient Arrived: Wheel Chair ?Any new allergies or adverse reactions: No ?Arrival Time: 14:48 ?Had a fall or experienced change in No ?Accompanied By: son ?activities of daily living that may affect ?Transfer Assistance: Manual ?risk of falls: ?Patient Requires Transmission-Based Precautions: No ?Signs or symptoms of abuse/neglect since last visito No ?Patient Has Alerts: No ?Hospitalized since last visit: No ?Implantable device outside of the clinic excluding No ?cellular tissue based products placed in the center ?since last visit: ?Has Dressing in Place as Prescribed: Yes ?Has Compression in Place as Prescribed: Yes ?Pain Present Now: Yes ?Notes ?per patient last Monday areas opened up applied bactroban and PCP ordered keflex started Tuesday. ?Electronic Signature(s) ?Signed: 06/04/2021 4:58:28 PM By: Shawn Stall RN, BSN ?Entered By: Shawn Stall on 06/04/2021 14:49:48 ?-------------------------------------------------------------------------------- ?Compression Therapy Details ?Patient Name: Date of Service: ?Catherine Powell, Catherine Powell. 06/04/2021 2:15 PM ?Medical Record Number: [id number] ?Patient Account Number: [id number] ?Date of Birth/Sex: Treating RN: ?November 20, 1965 (56 y.o. F) [name], [name] ?Primary Care Majel Giel: [facility], [place] Other Clinician: ?Referring Cartina Brousseau: ?Treating Magdalena Skilton/Extender: Geralyn Corwin ?[place] ?Weeks in Treatment: 6 ?Compression Therapy Performed  for Wound Assessment: Wound #2 Right,Proximal,Posterior Lower Leg ?Performed By: Clinician Shawn Stall, RN ?Compression Type: Three Layer ?Post Procedure Diagnosis ?Same as Pre-procedure ?Electronic Signature(s) ?Signed: 06/04/2021 4:58:28 PM By: Shawn Stall RN, BSN ?Entered By: Shawn Stall on 06/04/2021 15:15:03 ?-------------------------------------------------------------------------------- ?Compression Therapy Details ?Patient Name: ?Date of Service: ?Catherine Powell, Catherine Powell. 06/04/2021 2:15 PM ?Medical Record Number: [id number] ?Patient Account Number: [id number] ?Date of Birth/Sex: ?Treating RN: ?October 16, 1965 (56 y.o. F) [name], [name] ?Primary Care Duha Abair: [facility], [place] ?Other Clinician: ?Referring Yuri Fana: ?Treating Andreu Drudge/Extender: Geralyn Corwin ?[place] ?Weeks in Treatment: 6 ?Compression Therapy Performed for Wound Assessment: Wound #3 Right,Distal,Posterior Lower Leg ?Performed By: Clinician Shawn Stall, RN ?Compression Type: Three Layer ?Post Procedure Diagnosis ?Same as Pre-procedure ?Electronic Signature(s) ?Signed: 06/04/2021 4:58:28 PM By: Shawn Stall RN, BSN ?Entered By: Shawn Stall on 06/04/2021 15:15:03 ?-------------------------------------------------------------------------------- ?Encounter Discharge Information Details ?Patient Name: ?Date of Service: ?Catherine Powell, Catherine Powell. 06/04/2021 2:15 PM ?Medical Record Number: [id number] ?Patient Account Number: [id number] ?Date of Birth/Sex: ?Treating RN: ?[birth year] (56 y.o. F) [name], [name] ?Primary Care Jullian Clayson: [facility], [place] ?Other Clinician: ?Referring Lorrin Nawrot: ?Treating Finlay Godbee/Extender: Geralyn Corwin ?[place] ?Weeks in Treatment: 6 ?Encounter Discharge Information Items ?Discharge Condition: Stable ?Ambulatory Status: Wheelchair ?Discharge Destination: Home ?Transportation: Private Auto ?Accompanied By: son ?Schedule Follow-up Appointment: Yes ?Clinical Summary of Care: ?Electronic Signature(s) ?Signed: 06/04/2021  4:58:28 PM By: Shawn Stall RN, BSN ?Entered By: Shawn Stall on 06/04/2021 15:21:17 ?-------------------------------------------------------------------------------- ?Lower Extremity Assessment Details ?Patient Name: ?Date of Service: ?Catherine Powell, Catherine Powell. 06/04/2021 2:15 PM ?Medical Record Number: [id number] ?Patient Account Number: [id number] ?Date of Birth/Sex: ?Treating RN: ?Dec 16, 1965 (56 y.o. F) [name], [name] ?Primary Care Zuri Lascala: [facility], [place] ?Other Clinician: ?Referring Antara Brecheisen: ?Treating Emmajean Ratledge/Extender: Geralyn Corwin ?[place] ?Weeks in Treatment: 6 ?Edema Assessment ?Assessed: [Left: No] [Right: Yes] ?Edema: [Left: Yes] [Right: Yes] ?Calf ?Left: Right: ?  Point of Measurement: From Medial Instep 73 cm ?Ankle ?Left: Right: ?Point of Measurement: From Medial Instep 39 cm ?Electronic Signature(s) ?Signed: 06/04/2021 4:58:28 PM By: Shawn Stall RN, BSN ?Entered By: Shawn Stall on 06/04/2021 14:53:18 ?-------------------------------------------------------------------------------- ?Multi Wound Chart Details ?Patient Name: ?Date of Service: ?Catherine Powell, Catherine Powell. 06/04/2021 2:15 PM ?Medical Record Number: [id number] ?Patient Account Number: [id number] ?Date of Birth/Sex: ?Treating RN: ?[birth year] (56 y.o. F) [name], [name] ?Primary Care Ivis Henneman: [facility], [place] ?Other Clinician: ?Referring Zaineb Nowaczyk: ?Treating Benz Vandenberghe/Extender: Geralyn Corwin ?[place] ?Weeks in Treatment: 6 ?Vital Signs ?Height(in): 68 ?Pulse(bpm): 82 ?Weight(lbs): 430 ?Blood Pressure(mmHg): 121/80 ?Body Mass Index(BMI): 65.4 ?Temperature(??F): 98.3 ?Respiratory Rate(breaths/min): 22 ?Photos: [Powell/A:Powell/A] ?Right, Proximal, Posterior Lower Leg Right, Distal, Posterior Lower Leg Powell/A ?Wound Location: ?Gradually Appeared Gradually Appeared Powell/A ?Wounding Event: ?Lymphedema Lymphedema Powell/A ?Primary Etiology: ?Lymphedema, Asthma, Hypertension, Lymphedema, Asthma, Hypertension, Powell/A ?Comorbid History: ?Peripheral Venous Disease,  Peripheral Venous Disease, ?Osteoarthritis Osteoarthritis ?05/28/2021 06/04/2021 Powell/A ?Date Acquired: ?0 0 Powell/A ?Weeks of Treatment: ?Open Open Powell/A ?Wound Status: ?No No Powell/A ?Wound Recurrence: ?0.1x0.7x0.2 0.9x0.9x0.1 Powell/A ?Measurements L x W x D (cm) ?0.055 0.636 Powell/A ?A (cm?) : ?rea ?0.011 0.064 Powell/A ?Volume (cm?) : ?0.00% 0.00% Powell/A ?% Reduction in Area: ?0.00% 0.00% Powell/A ?% Reduction in Volume: ?Full Thickness Without Exposed Full Thickness Without Exposed Powell/A ?Classification: ?Support Structures Support Structures ?Medium Medium Powell/A ?Exudate Amount: ?Serous Serous Powell/A ?Exudate Type: ?Media planner Powell/A ?Exudate Color: ?Distinct, outline attached Distinct, outline attached Powell/A ?Wound Margin: ?Medium (34-66%) Medium (34-66%) Powell/A ?Granulation Amount: ?Red, Pink Red, Pink Powell/A ?Granulation Quality: ?Medium (34-66%) Medium (34-66%) Powell/A ?Necrotic Amount: ?Fat Layer (Subcutaneous Tissue): Yes Fat Layer (Subcutaneous Tissue): Yes Powell/A ?Exposed Structures: ?Fascia: No ?Fascia: No ?Tendon: No ?Tendon: No ?Muscle: No ?Muscle: No ?Joint: No ?Joint: No ?Bone: No ?Bone: No ?Small (1-33%) Small (1-33%) Powell/A ?Epithelialization: ?Compression Therapy Compression Therapy Powell/A ?Procedures Performed: ?Treatment Notes ?Wound #2 (Lower Leg) Wound Laterality: Right, Posterior, Proximal ?Cleanser ?Soap and Water ?Discharge Instruction: May shower and wash wound with dial antibacterial soap and water prior to dressing change. ?Wound Cleanser ?Discharge Instruction: Cleanse the wound with wound cleanser prior to applying a clean dressing using gauze sponges, not tissue or cotton balls. ?Peri-Wound Care ?Sween Lotion (Moisturizing lotion) ?Discharge Instruction: Apply moisturizing lotion as directed ?Topical ?Primary Dressing ?KerraCel Ag Gelling Fiber Dressing, 2x2 in (silver alginate) ?Discharge Instruction: Apply silver alginate to wound bed as instructed ?Secondary Dressing ?Woven Gauze Sponge, Non-Sterile 4x4 in ?Discharge Instruction: Apply over  primary dressing as directed. ?Secured With ?Compression Wrap ?ThreePress (3 layer compression wrap) ?Discharge Instruction: Apply three layer compression unna boot first layer applied to keep compression wrap in p

## 2021-06-05 ENCOUNTER — Telehealth: Payer: Self-pay | Admitting: Family Medicine

## 2021-06-05 NOTE — Telephone Encounter (Signed)
Called patient and let her know the FMLA forms have been faxed. She said she will call and check with them tomorrow to see if they received them. She also wanted me to let you know she was seen at wound care yesterday 06/04/21 and she now has three open wounds on the back of her leg.  ? ?Thank you! ?Brooke  ?

## 2021-06-05 NOTE — Telephone Encounter (Signed)
FMLA forms completed and placed in front office for faxing.  Please notify patient when completed. ? ?Thank you ?Carollee Leitz, MD ?[facility]   ?

## 2021-06-06 NOTE — Telephone Encounter (Signed)
Thank You.

## 2021-06-11 ENCOUNTER — Ambulatory Visit (INDEPENDENT_AMBULATORY_CARE_PROVIDER_SITE_OTHER): Payer: BC Managed Care – PPO | Admitting: Family Medicine

## 2021-06-11 ENCOUNTER — Encounter: Payer: Self-pay | Admitting: Family Medicine

## 2021-06-11 VITALS — BP 142/75 | HR 83 | Wt >= 6400 oz

## 2021-06-11 DIAGNOSIS — L97211 Non-pressure chronic ulcer of right calf limited to breakdown of skin: Secondary | ICD-10-CM

## 2021-06-11 DIAGNOSIS — K219 Gastro-esophageal reflux disease without esophagitis: Secondary | ICD-10-CM

## 2021-06-11 DIAGNOSIS — J4530 Mild persistent asthma, uncomplicated: Secondary | ICD-10-CM | POA: Diagnosis not present

## 2021-06-11 DIAGNOSIS — I10 Essential (primary) hypertension: Secondary | ICD-10-CM

## 2021-06-11 DIAGNOSIS — J309 Allergic rhinitis, unspecified: Secondary | ICD-10-CM

## 2021-06-11 DIAGNOSIS — I83012 Varicose veins of right lower extremity with ulcer of calf: Secondary | ICD-10-CM | POA: Diagnosis not present

## 2021-06-11 DIAGNOSIS — M170 Bilateral primary osteoarthritis of knee: Secondary | ICD-10-CM

## 2021-06-11 MED ORDER — CHLORTHALIDONE 25 MG PO TABS: 25.0000 mg | ORAL_TABLET | Freq: Every day | ORAL | 0 refills | Status: DC

## 2021-06-11 MED ORDER — ESOMEPRAZOLE MAGNESIUM 40 MG PO CPDR: DELAYED_RELEASE_CAPSULE | ORAL | 3 refills | Status: DC

## 2021-06-11 MED ORDER — HYDROCODONE-ACETAMINOPHEN 10-325 MG PO TABS: 0.5000 | ORAL_TABLET | Freq: Four times a day (QID) | ORAL | 0 refills | Status: DC | PRN

## 2021-06-11 MED ORDER — MONTELUKAST SODIUM 10 MG PO TABS: 10.0000 mg | ORAL_TABLET | Freq: Every day | ORAL | 3 refills | Status: DC

## 2021-06-11 MED ORDER — BUDESONIDE-FORMOTEROL FUMARATE 160-4.5 MCG/ACT IN AERO: INHALATION_SPRAY | RESPIRATORY_TRACT | 5 refills | Status: DC

## 2021-06-11 NOTE — Progress Notes (Signed)
? ? ?  SUBJECTIVE:  ? ?CHIEF COMPLAINT / HPI: follow up wound ? ?Non healing right lower leg wound ?Chronic issue.  Completed course of antibiotics 03/27.  Seen by Vernie Shanks 03/27 and since then wound have been improving.  Reports another area opening below healing wounds. Denies any fevers.  Has follow up appointment tomorrow am with wound care. ?  ?PERTINENT  PMH / PSH:  ?HTN ?Lipedema ?Chronic Lymphedema ?Chronic Venous Stasis ?Obesity class 3 ?HLD ?Chronic pain secondary to OA ?W/C dependent ? ? ? ?OBJECTIVE:  ? ?BP (!) 142/75   Pulse 83   Wt (!) 429 lb 12.8 oz (195 kg)   LMP 05/15/2012   SpO2 98%   BMI 65.35 kg/m?   ? ?General: Alert, no acute distress ?Cardio: Normal S1 and S2, RRR, no r/m/g ?Pulm: CTAB, normal work of breathing ?Right posterior lower extremity: healing wounds, two smaller open areas below healing wounds.  No erythema or ecchymosis.  Chronic edema.   ? ? ?ASSESSMENT/PLAN:  ? ?Venous stasis ulcer (HCC) ?Chronic wound. ?Elevate legs ?Continue to follow up with wound care.   ? ?Essential hypertension ?Continues to be elevated ?Continue Quinapril 20 mg daily ?BMet today ?Start Chlorthalidone 25 mg daily ?Repeat Bmet in 1 week ?Strict return precautions provided ?Follow up in 2-3 weeks ? ?Asthma, chronic, mild persistent, uncomplicated ?Refill Symbicort 2 puffs BID ? ?GASTROESOPHAGEAL REFLUX, NO ESOPHAGITIS ?Refill Nexium 40 mg daily ? ?Degenerative joint disease of knees ?Refill Norco 5/325 mg q6h prn x 32 days ?Approved for electric wheelchair ? ?Allergic rhinitis ?Continue Singulair 10 mg daily ?  ?Healthcare Maintenance ?Patient aware mammogram and colonoscopy overdue.  Will continue to address at future visits ?Recommend Shingle vaccine ? ?[name], MD ?[facility]  ?

## 2021-06-11 NOTE — Patient Instructions (Signed)
Thank you for coming to see me today. It was a pleasure.  ? ?Start Chlorthalidone 25 mg daily.  Take in the morning. ?Continue all other blood pressure medications ? ?Come back one week after starting the new blood blood pressure medication for blood work.  Please schedule a lab appointment. ? ?Continue to follow up with wound care. ? ?Please follow-up with 2-3 weeks for Blood pressure check ? ?If you have any questions or concerns, please do not hesitate to call the office at [phone]. ? ?Best,  ? ?[name], MD   ?

## 2021-06-12 ENCOUNTER — Encounter (HOSPITAL_BASED_OUTPATIENT_CLINIC_OR_DEPARTMENT_OTHER): Payer: BC Managed Care – PPO | Attending: Internal Medicine | Admitting: Internal Medicine

## 2021-06-12 ENCOUNTER — Other Ambulatory Visit: Payer: Self-pay | Admitting: Family Medicine

## 2021-06-12 ENCOUNTER — Encounter: Payer: Self-pay | Admitting: Family Medicine

## 2021-06-12 DIAGNOSIS — Z993 Dependence on wheelchair: Secondary | ICD-10-CM | POA: Insufficient documentation

## 2021-06-12 DIAGNOSIS — I87311 Chronic venous hypertension (idiopathic) with ulcer of right lower extremity: Secondary | ICD-10-CM | POA: Insufficient documentation

## 2021-06-12 DIAGNOSIS — L97812 Non-pressure chronic ulcer of other part of right lower leg with fat layer exposed: Secondary | ICD-10-CM | POA: Diagnosis not present

## 2021-06-12 DIAGNOSIS — I890 Lymphedema, not elsewhere classified: Secondary | ICD-10-CM | POA: Diagnosis not present

## 2021-06-12 DIAGNOSIS — B3731 Acute candidiasis of vulva and vagina: Secondary | ICD-10-CM | POA: Insufficient documentation

## 2021-06-12 DIAGNOSIS — Z6841 Body Mass Index (BMI) 40.0 and over, adult: Secondary | ICD-10-CM | POA: Diagnosis not present

## 2021-06-12 LAB — BASIC METABOLIC PANEL
BUN/Creatinine Ratio: 11 (ref 9–23)
BUN: 8 mg/dL (ref 6–24)
CO2: 24 mmol/L (ref 20–29)
Calcium: 9 mg/dL (ref 8.7–10.2)
Chloride: 103 mmol/L (ref 96–106)
Creatinine, Ser: 0.72 mg/dL (ref 0.57–1.00)
Glucose: 113 mg/dL — ABNORMAL HIGH (ref 70–99)
Potassium: 4.3 mmol/L (ref 3.5–5.2)
Sodium: 142 mmol/L (ref 134–144)
eGFR: 97 mL/min/{1.73_m2} (ref >59)

## 2021-06-12 MED ORDER — HYDROCODONE-ACETAMINOPHEN 5-325 MG PO TABS: 1.0000 | ORAL_TABLET | Freq: Four times a day (QID) | ORAL | 0 refills | Status: DC | PRN

## 2021-06-12 MED ORDER — NYSTATIN 100000 UNIT/GM EX CREA
TOPICAL_CREAM | Freq: Two times a day (BID) | CUTANEOUS | 2 refills | Status: DC
Start: 2021-06-12 — End: 2021-07-10

## 2021-06-12 NOTE — Progress Notes (Addendum)
AVIANAH, PELLMAN ([id number]) ?Visit Report for 06/12/2021 ?Chief Complaint Document Details ?Patient Name: Date of Service: ?Catherine Powell, Catherine ERLY H. 06/12/2021 8:15 A M ?Medical Record Number: [id number] ?Patient Account Number: [id number] ?Date of Birth/Sex: Treating RN: ?1965-04-21 (56 y.o. F) [name], [name] ?Primary Care Provider: [facility], [place] Other Clinician: ?Referring Provider: ?Treating Provider/Extender: Geralyn Corwin ?[place] LSH, [place] ?Weeks in Treatment: 7 ?Information Obtained from: Patient ?Chief Complaint ?Right posterior leg wound ?Electronic Signature(s) ?Signed: 06/12/2021 10:16:20 AM By: Geralyn Corwin DO ?Entered By: Geralyn Corwin on 06/12/2021 10:07:30 ?-------------------------------------------------------------------------------- ?HPI Details ?Patient Name: Date of Service: ?Catherine Powell, Catherine ERLY H. 06/12/2021 8:15 A M ?Medical Record Number: [id number] ?Patient Account Number: [id number] ?Date of Birth/Sex: Treating RN: ?1965-04-12 (56 y.o. F) [name], [name] ?Primary Care Provider: [facility], [place] Other Clinician: ?Referring Provider: ?Treating Provider/Extender: Geralyn Corwin ?[place] LSH, [place] ?Weeks in Treatment: 7 ?History of Present Illness ?HPI Description: Admission 04/20/2021 ?Ms. [name] is a 56 year old female with a past medical history of morbid obesity with BMI of 66 and wheelchair dependent due to this, lymphedema ?and hypertension that presents to the clinic for a 38-month history of nonhealing ulcer to the right calf. She states this started spontaneously and has been ?using Unna boots and Xeroform with benefit She saw vein and vascular who recommended bariatric surgery. She currently denies systemic signs of infection. ?2/17; patient presents for follow-up. She tolerated the compression wrap well. She does report increased tenderness to the surrounding wound bed. ?2/24; patient presents for follow-up. She reports taking Keflex with improvement in her symptoms. She has developed  some vaginal itching and reports a ?history of yeast infection after taking antibiotics. She has her juxta lite compression today. ?3/27; patient presents for follow-up. Patient states that she has had 2 areas open up to the right lower extremity over the past week. She was started on Keflex ?by her primary care physician for potential cellulitis. She has been using her juxta light compression daily without issues. She also has a compression stocking ?to the left lower extremity that she has been using since discharge from clinic on 2/24. ?4/4; patient presents for follow-up. She reports that the compression wrap placed in clinic last visit slid down the following day. She has been using silver ?alginate to the wound site and using her juxta lite compression daily. She has no issues or complaints today. She would like a note to be out of work so that she ?can elevate her legs to help heal the wounds. ?Electronic Signature(s) ?Signed: 06/12/2021 10:16:20 AM By: Geralyn Corwin DO ?Entered By: Geralyn Corwin on 06/12/2021 10:08:48 ?-------------------------------------------------------------------------------- ?Physical Exam Details ?Patient Name: Date of Service: ?Catherine Powell, Catherine ERLY H. 06/12/2021 8:15 A M ?Medical Record Number: [id number] ?Patient Account Number: [id number] ?Date of Birth/Sex: Treating RN: ?10-31-1965 (56 y.o. F) [name], [name] ?Primary Care Provider: [facility], [place] Other Clinician: ?Referring Provider: ?Treating Provider/Extender: Geralyn Corwin ?[place] L[facility], [place] ?Weeks in Treatment: 7 ?Constitutional ?respirations regular, non-labored and within target range for patient.Marland Kitchen ?Cardiovascular ?2+ dorsalis pedis/posterior tibialis pulses. ?Psychiatric ?pleasant and cooperative. ?Notes ?Right lower extremity: T the posterior aspect there are 2 areas that are open limited to skin breakdown. No signs of surrounding infection. Nonpitting edema to ?o ?the knee. Lymphedema skin changes noted. ?Electronic  Signature(s) ?Signed: 06/12/2021 10:16:20 AM By: Geralyn Corwin DO ?Entered By: Geralyn Corwin on 06/12/2021 10:09:51 ?-------------------------------------------------------------------------------- ?Physician Orders Details ?Patient Name: Date of Service: ?Catherine Powell, Catherine ERLY H. 06/12/2021 8:15 A M ?  Medical Record Number: [id number] ?Patient Account Number: [id number] ?Date of Birth/Sex: Treating RN: ?1965/08/26 (56 y.o. 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 - Dr. Mikey Bussing (Dr. Leanord Hawking covering) and [place], Room 8 Tuesday 06/19/2021 3pm ?Return A ?Bathing/ Shower/ Hygiene ?May shower with protection but do not get wound dressing(s) wet. ?Edema Control - Lymphedema / SCD / Other ?Lymphedema Pumps. Use Lymphedema pumps on leg(s) 2-3 times a day for 45-60 minutes. If wearing any wraps or hose, do not remove ?them. Continue exercising as instructed. - wound center to order lymphedema pumps from [employer]- someone will call you once approved ?by insurance. ?Elevate legs to the level of the heart or above for 30 minutes daily and/or when sitting, a frequency of: - throughout the day. ?Avoid standing for long periods of time. ?Exercise regularly ?Moisturize legs daily. - apply left leg every night before bed. ?Compression stocking or Garment 20-30 mm/Hg pressure to: - Apply juxtalite HD in the morning and remove  at night to left leg. ?Wound Treatment ?Wound #2 - Lower Leg Wound Laterality: Right, Posterior, Proximal ?Cleanser: Soap and Water 2 x Per Week/30 Days ?Discharge Instructions: May shower and wash wound with dial antibacterial soap and water prior to dressing change. ?Cleanser: Wound Cleanser 2 x Per Week/30 Days ?Discharge Instructions: Cleanse the wound with wound cleanser prior to applying a clean dressing using gauze sponges, not tissue or cotton balls. ?Peri-Wound Care: Sween Lotion (Moisturizing lotion) 2 x Per Week/30 Days ?Discharge Instructions: Apply moisturizing lotion as directed ?Prim Dressing: KerraCel Ag Gelling Fiber Dressing, 2x2 in (silver alginate) 2 x Per Week/30 Days ?ary ?Discharge Instructions: Apply silver alginate to wound bed as instructed ?Secondary Dressing: Zetuvit Plus Silicone Border Dressing 5x5 (in/in) 2 x Per Week/30 Days ?Discharge Instructions: Apply silicone border over primary dressing as directed. ?Compression Stockings: Circaid Juxta Lite Compression Wrap ?Right Leg Compression Amount: 30-40 mmHG ?Discharge Instructions: Apply Circaid Juxta Lite Compression Wrap daily as instructed. Apply first thing in the morning, remove at ?night before bed. ?Wound #3 - Lower Leg Wound Laterality: Right, Posterior, Distal ?Cleanser: Soap and Water 2 x Per Week/30 Days ?Discharge Instructions: May shower and wash wound with dial antibacterial soap and water prior to dressing change. ?Cleanser: Wound Cleanser 2 x Per Week/30 Days ?Discharge Instructions: Cleanse the wound with wound cleanser prior to applying a clean dressing using gauze sponges, not tissue or cotton balls. ?Peri-Wound Care: Sween Lotion (Moisturizing lotion) 2 x Per Week/30 Days ?Discharge Instructions: Apply moisturizing lotion as directed ?Prim Dressing: KerraCel Ag Gelling Fiber Dressing, 2x2 in (silver alginate) 2 x Per Week/30 Days ?ary ?Discharge Instructions: Apply silver alginate to wound bed as  instructed ?Secondary Dressing: Zetuvit Plus Silicone Border Dressing 5x5 (in/in) 2 x Per Week/30 Days ?Discharge Instructions: Apply silicone border over primary dressing as directed. ?Electronic Signature(s) ?Signed: 06/12/2021 10

## 2021-06-12 NOTE — Assessment & Plan Note (Signed)
Refill Norco 5/325 mg q6h prn x 32 days ?Approved for electric wheelchair ?

## 2021-06-12 NOTE — Assessment & Plan Note (Signed)
Refill Nexium 40 mg daily ?

## 2021-06-12 NOTE — Assessment & Plan Note (Signed)
Continue Singulair 10 mg daily.

## 2021-06-12 NOTE — Assessment & Plan Note (Signed)
Chronic wound. ?Elevate legs ?Continue to follow up with wound care.   ?

## 2021-06-12 NOTE — Assessment & Plan Note (Signed)
Continues to be elevated ?Continue Quinapril 20 mg daily ?BMet today ?Start Chlorthalidone 25 mg daily ?Repeat Bmet in 1 week ?Strict return precautions provided ?Follow up in 2-3 weeks ?

## 2021-06-12 NOTE — Assessment & Plan Note (Addendum)
Refill Symbicort 2 puffs BID ?

## 2021-06-12 NOTE — Progress Notes (Signed)
Letter sent with normal results. ? ?Catherine Allan, MD ?Family Medicine Res[facility]   ?

## 2021-06-12 NOTE — Progress Notes (Addendum)
Catherine Powell, Catherine Powell ([id number]) ?Visit Report for 06/12/2021 ?Arrival Information Details ?Patient Name: Date of Service: ?Catherine Powell, Catherine ERLY H. 06/12/2021 8:15 A M ?Medical Record Number: [id number] ?Patient Account Number: [id number] ?Date of Birth/Sex: Treating RN: ?[birth year] Powell (56 y.o. F) Powell, Catherine ?Primary Care Catherine Powell: Catherine Powell, [place] Catherine Powell Other Clinician: ?Referring Catherine Powell: ?Treating Catherine Powell/Extender: Catherine Powell ?[place] LSH, TA Catherine Powell ?Weeks in Treatment: 7 ?Visit Information History Since Last Visit ?Added or deleted any medications: No ?Patient Arrived: Wheel Chair ?Any new allergies or adverse reactions: No ?Arrival Time: 08:53 ?Had a fall or experienced change in No ?Accompanied By: son ?activities of daily living that may affect ?Transfer Assistance: Manual ?risk of falls: ?Patient Identification Verified: Yes ?Signs or symptoms of abuse/neglect since last visito No ?Secondary Verification Process Completed: Yes ?Hospitalized since last visit: No ?Patient Requires Transmission-Based Precautions: No ?Implantable device outside of the clinic excluding No ?Patient Has Alerts: No ?cellular tissue based products placed in the Powell ?since last visit: ?Has Dressing in Place as Prescribed: Yes ?Has Compression in Place as Prescribed: No ?Pain Present Now: No ?Electronic Signature(s) ?Signed: 06/12/2021 4:56:30 PM By: Catherine Stall RN, BSN ?Entered By: Catherine Powell on 06/12/2021 08:54:13 ?-------------------------------------------------------------------------------- ?Clinic Level of Care Assessment Details ?Patient Name: Date of Service: ?Catherine Powell, Catherine ERLY H. 06/12/2021 8:15 A M ?Medical Record Number: [id number] ?Patient Account Number: [id number] ?Date of Birth/Sex: Treating RN: ?Catherine Powell (57 y.o. F) Powell, Catherine ?Primary Care Catherine Powell: Catherine Powell, [place] Catherine Powell Other Clinician: ?Referring Catherine Powell: ?Treating Catherine Powell/Extender: Catherine Powell ?[place] LSH, TA Catherine Powell ?Weeks in Treatment: 7 ?Clinic Level of Care Assessment Items ?TOOL  4 Quantity Score ?X- 1 0 ?Use when only an EandM is performed on FOLLOW-UP visit ?ASSESSMENTS - Nursing Assessment / Reassessment ?X- 1 10 ?Reassessment of Co-morbidities (includes updates in patient status) ?X- 1 5 ?Reassessment of Adherence to Treatment Plan ?ASSESSMENTS - Wound and Skin A ssessment / Reassessment ?[]  - 0 ?Simple Wound Assessment / Reassessment - one wound ?X- 2 5 ?Complex Wound Assessment / Reassessment - multiple wounds ?X- 1 10 ?Dermatologic / Skin Assessment (not related to wound area) ?ASSESSMENTS - Focused Assessment ?X- 1 5 ?Circumferential Edema Measurements - multi extremities ?X- 1 10 ?Nutritional Assessment / Counseling / Intervention ?[]  - 0 ?Lower Extremity Assessment (monofilament, tuning fork, pulses) ?[]  - 0 ?Peripheral Arterial Disease Assessment (using hand held doppler) ?ASSESSMENTS - Ostomy and/or Continence Assessment and Care ?[]  - 0 ?Incontinence Assessment and Management ?[]  - 0 ?Ostomy Care Assessment and Management (repouching, etc.) ?PROCESS - Coordination of Care ?[]  - 0 ?Simple Patient / Family Education for ongoing care ?X- 1 20 ?Complex (extensive) Patient / Family Education for ongoing care ?X- 1 10 ?Staff obtains Consents, Records, T Results / Process Orders ?est ?[]  - 0 ?Staff telephones HHA, Nursing Homes / Clarify orders / etc ?[]  - 0 ?Routine Transfer to another Facility (non-emergent condition) ?[]  - 0 ?Routine Hospital Admission (non-emergent condition) ?[]  - 0 ?New Admissions / / Ordering NPWT Apligraf, etc. ?, ?[]  - 0 ?Emergency Hospital Admission (emergent condition) ?[]  - 0 ?Simple Discharge Coordination ?X- 1 15 ?Complex (extensive) Discharge Coordination ?PROCESS - Special Needs ?[]  - 0 ?Pediatric / Minor Patient Management ?[]  - 0 ?Isolation Patient Management ?[]  - 0 ?Hearing / Language / Visual special needs ?[]  - 0 ?Assessment of Community assistance (transportation, D/C planning, etc.) ?[]  - 0 ?Additional assistance /  Altered mentation ?[]  - 0 ?Support Surface(s) Assessment (bed, cushion, seat, etc.) ?INTERVENTIONS - Wound Cleansing / Measurement ?[]  -  0 ?Simple Wound Cleansing - one wound ?X- 2 5 ?Complex Wound Cleansing - multiple wounds ?X- 1 5 ?Wound Imaging (photographs - any number of wounds) ?[]  - 0 ?Wound Tracing (instead of photographs) ?[]  - 0 ?Simple Wound Measurement - one wound ?X- 2 5 ?Complex Wound Measurement - multiple wounds ?INTERVENTIONS - Wound Dressings ?X - Small Wound Dressing one or multiple wounds 1 10 ?[]  - 0 ?Medium Wound Dressing one or multiple wounds ?[]  - 0 ?Large Wound Dressing one or multiple wounds ?[]  - 0 ?Application of Medications - topical ?[]  - 0 ?Application of Medications - injection ?INTERVENTIONS - Miscellaneous ?[]  - 0 ?External ear exam ?[]  - 0 ?Specimen Collection (cultures, biopsies, blood, body fluids, etc.) ?[]  - 0 ?Specimen(s) / Culture(s) sent or taken to Lab for analysis ?[]  - 0 ?Patient Transfer (multiple staff / / Similar devices) ?[]  - 0 ?Simple Staple / Suture removal (25 or less) ?[]  - 0 ?Complex Staple / Suture removal (26 or more) ?[]  - 0 ?Hypo / Hyperglycemic Management (close monitor of Blood Glucose) ?[]  - 0 ?Ankle / Brachial Index (ABI) - do not check if billed separately ?X- 1 5 ?Vital Signs ?Has the patient been seen at the hospital within the last three years: Yes ?Total Score: 135 ?Level Of Care: New/Established - Level 4 ?Electronic Signature(s) ?Signed: 06/15/2021 4:11:11 PM By: RN, BSN ?Entered By: on 06/15/2021 11:46:43 ?-------------------------------------------------------------------------------- ?Lower Extremity Assessment Details ?Patient Name: Date of Service: ?Catherine Powell, Catherine ERLY H. 06/12/2021 8:15 A M ?Medical Record Number: ?Patient Account Number: ?Date of Birth/Sex: Treating RN: ?Catherine Powell (56 y.o. F) Powell, Catherine ?Primary Care Catherine Powell: [facility], Catherine Powell Other Clinician: ?Referring  Kattia Selley: ?Treating Aaran Enberg/Extender: ?WA LSH, TA Catherine Powell ?Weeks in Treatment: 7 ?Edema Assessment ?Assessed: [Left: No] [Right: Yes] ?Edema: [Left: Yes] [Right: Yes] ?Calf ?Left: Right: ?Point of Measurement: From Medial Instep 78 cm ?Ankle ?Left: Right: ?Point of Measurement: From Medial Instep 36 cm ?Vascular Assessment ?Pulses: ?Dorsalis Pedis ?Palpable: [Right:Yes] ?Electronic Signature(s) ?Signed: 06/12/2021 4:56:30 PM By: RN, BSN ?Entered By: 08/15/2021 on 06/12/2021 08:57:36 ?-------------------------------------------------------------------------------- ?Multi Wound Chart Details ?Patient Name: Date of Service: ?Catherine Powell, Catherine ERLY H. 06/12/2021 8:15 A M ?Medical Record Number: 08/15/2021 ?Patient Account Number: 08/12/2021 ?Date of Birth/Sex: Treating RN: ?[birth year] Powell (56 y.o. F) Powell, Catherine ?Primary Care Nymir Ringler: [facility], ([address] Catherine Powell Other Clinician: ?Referring Kadejah Sandiford: ?Treating Joydan Gretzinger/Extender: [facility] ?[place] LSH, TA Catherine Powell ?Weeks in Treatment: 7 ?Vital Signs ?Height(in): 68 ?Pulse(bpm): 82 ?Weight(lbs): 430 ?Blood Pressure(mmHg): 86/75 ?Body Mass Index(BMI): 65.4 ?Temperature(??F): 97.9 ?Respiratory Rate(breaths/min): 22 ?Photos: [Powell/A:Powell/A] ?Right, Proximal, Posterior Lower Leg Right, Distal, Posterior Lower Leg Powell/A ?Wound Location: ?Gradually Appeared Gradually Appeared Powell/A ?Wounding Event: ?Lymphedema Lymphedema Powell/A ?Primary Etiology: ?Lymphedema, Asthma, Hypertension, Lymphedema, Asthma, Hypertension, Powell/A ?Comorbid History: ?Peripheral Venous Disease, Peripheral Venous Disease, ?Osteoarthritis Osteoarthritis ?05/28/2021 06/04/2021 Powell/A ?Date Acquired: ?1 1 Powell/A ?Weeks of Treatment: ?Open Open Powell/A ?Wound Status: ?No No Powell/A ?Wound Recurrence: ?0.1x0.1x0.1 0.3x0.3x0.1 Powell/A ?Measurements L x W x D (cm) ?0.008 0.071 Powell/A ?A (cm?) : ?rea ?0.001 0.007 Powell/A ?Volume (cm?) : ?85.50% 88.80% Powell/A ?% Reduction in Area: ?90.90% 89.10% Powell/A ?% Reduction in Volume: ?Full Thickness Without Exposed  Full Thickness Without Exposed Powell/A ?Classification: ?Support Structures Support Structures ?Small Medium Powell/A ?Exudate Amount: ?Serous Serous Powell/A ?Exudate Type: ?08/12/2021 Powell/A ?Exudate Color: ?Distinct, outline attache

## 2021-06-13 ENCOUNTER — Other Ambulatory Visit: Payer: Self-pay | Admitting: Family Medicine

## 2021-06-13 MED ORDER — HYDROCODONE-ACETAMINOPHEN 5-325 MG PO TABS: 1.0000 | ORAL_TABLET | Freq: Four times a day (QID) | ORAL | 0 refills | Status: AC | PRN

## 2021-06-13 NOTE — Progress Notes (Signed)
Prescription resent for Norco 5/325 mg q6h prn x 32 tabs ? ?[name], MD ?Family Medicine Residency   ?

## 2021-06-13 NOTE — Telephone Encounter (Signed)
resent

## 2021-06-14 DIAGNOSIS — I890 Lymphedema, not elsewhere classified: Secondary | ICD-10-CM | POA: Diagnosis not present

## 2021-06-19 ENCOUNTER — Encounter (HOSPITAL_BASED_OUTPATIENT_CLINIC_OR_DEPARTMENT_OTHER): Payer: BC Managed Care – PPO | Admitting: Internal Medicine

## 2021-06-19 DIAGNOSIS — L97212 Non-pressure chronic ulcer of right calf with fat layer exposed: Secondary | ICD-10-CM | POA: Diagnosis not present

## 2021-06-19 DIAGNOSIS — I890 Lymphedema, not elsewhere classified: Secondary | ICD-10-CM | POA: Diagnosis not present

## 2021-06-19 DIAGNOSIS — Z993 Dependence on wheelchair: Secondary | ICD-10-CM | POA: Diagnosis not present

## 2021-06-19 DIAGNOSIS — B3731 Acute candidiasis of vulva and vagina: Secondary | ICD-10-CM | POA: Diagnosis not present

## 2021-06-19 DIAGNOSIS — L97812 Non-pressure chronic ulcer of other part of right lower leg with fat layer exposed: Secondary | ICD-10-CM | POA: Diagnosis not present

## 2021-06-19 DIAGNOSIS — Z6841 Body Mass Index (BMI) 40.0 and over, adult: Secondary | ICD-10-CM | POA: Diagnosis not present

## 2021-06-19 DIAGNOSIS — I87311 Chronic venous hypertension (idiopathic) with ulcer of right lower extremity: Secondary | ICD-10-CM | POA: Diagnosis not present

## 2021-06-20 ENCOUNTER — Ambulatory Visit: Payer: BC Managed Care – PPO | Admitting: Family Medicine

## 2021-06-20 ENCOUNTER — Other Ambulatory Visit: Payer: BC Managed Care – PPO

## 2021-06-20 ENCOUNTER — Encounter: Payer: Self-pay | Admitting: Family Medicine

## 2021-06-21 ENCOUNTER — Other Ambulatory Visit: Payer: BC Managed Care – PPO

## 2021-06-22 NOTE — Progress Notes (Signed)
Catherine, Powell ([id number]) ?Visit Report for 06/19/2021 ?HPI Details ?Patient Name: Date of Service: ?Catherine N, [name] 06/19/2021 3:00 PM ?Medical Record Number: [id number] ?Patient Account Number: [id number] ?Date of Birth/Sex: Treating RN: ?1966/01/04 (55 y.o. F) Catherine Powell ?Primary Care Provider: [facility], [place] Other Clinician: ?Referring Provider: ?Treating Provider/Extender: Catherine Powell ?[place] [facility], [place] ?Weeks in Treatment: 8 ?History of Present Illness ?HPI Description: Admission 04/20/2021 ?Ms. Catherine Powell is a 56 year old female with a past medical history of morbid obesity with BMI of 66 and wheelchair dependent due to this, lymphedema ?and hypertension that presents to the clinic for a 15-month history of nonhealing ulcer to the right calf. She states this started spontaneously and has been ?using Unna boots and Xeroform with benefit She saw vein and vascular who recommended bariatric surgery. She currently denies systemic signs of infection. ?2/17; patient presents for follow-up. She tolerated the compression wrap well. She does report increased tenderness to the surrounding wound bed. ?2/24; patient presents for follow-up. She reports taking Keflex with improvement in her symptoms. She has developed some vaginal itching and reports a ?history of yeast infection after taking antibiotics. She has her juxta lite compression today. ?3/27; patient presents for follow-up. Patient states that she has had 2 areas open up to the right lower extremity over the past week. She was started on Keflex ?by her primary care physician for potential cellulitis. She has been using her juxta light compression daily without issues. She also has a compression stocking ?to the left lower extremity that she has been using since discharge from clinic on 2/24. ?4/4; patient presents for follow-up. She reports that the compression wrap placed in clinic last visit slid down the following day. She has been using  silver ?alginate to the wound site and using her juxta lite compression daily. She has no issues or complaints today. She would like a note to be out of work so that she ?can elevate her legs to help heal the wounds. ?4/11; the patient arrives today with the area on her right posterior calf totally closed. She has severe lymphedema but has juxta lite stockings and new ?compression pumps arrive this morning she has not yet used them. ?Electronic Signature(s) ?Signed: 06/19/2021 4:12:10 PM By: Catherine Najjar MD ?Entered By: Catherine Powell on 06/19/2021 15:59:36 ?-------------------------------------------------------------------------------- ?Physical Exam Details ?Patient Name: Date of Service: ?Catherine N, [name] 06/19/2021 3:00 PM ?Medical Record Number: [id number] ?Patient Account Number: [id number] ?Date of Birth/Sex: Treating RN: ?[birth year] (56 y.o. F) Catherine Powell ?Primary Care Provider: [facility], [place] Other Clinician: ?Referring Provider: ?Treating Provider/Extender: Catherine Powell ?[place] ?Weeks in Treatment: 8 ?Constitutional ?Pulse regular and within target range for patient.. Temperature is normal and within the target range for the patient.Marland Kitchen ?Notes ?Wound exam; right lower extremity posterior calf. There were 2 areas open last week there is nothing open today however she continues to have very poorly ?controlled lymphedema. She does have new juxta lite stockings and new compression pumps and we have urged her to start this as soon as possible. She is ?already had instruction on the compression pump usage 40 mmHg compression twice a day for 1 hour ?Electronic Signature(s) ?Signed: 06/19/2021 4:12:10 PM By: Catherine Najjar MD ?Entered By: Catherine Powell on 06/19/2021 16:04:25 ?-------------------------------------------------------------------------------- ?Physician Orders Details ?Patient Name: ?Date of Service: ?Catherine N, [name] 06/19/2021 3:00 PM ?Medical Record Number: [id number] ?Patient  Account Number: [id number] ?Date of Birth/Sex: ?Treating RN: ?Jun 10, 1965 (56 y.o. F)  Catherine Powell ?Primary Care Provider: [facility], [place] ?Other Clinician: ?Referring Provider: ?Treating Provider/Extender: Catherine Powell ?[place] ?Weeks in Treatment: 8 ?Verbal / Phone Orders: No ?Diagnosis Coding ?Follow-up Appointments ?Other: - NO need to follow up!!:) Call us if you have any future issues!!:):):) ?Discharge From [facility] ?Discharge from Wound Care Center ?Edema Control - Lymphedema / SCD / Other ?Lymphedema Pumps. Use Lymphedema pumps on leg(s) 2-3 times a day for 45-60 minutes. If wearing any wraps or hose, do not remove ?them. Continue exercising as instructed. ?Elevate legs to the level of the heart or above for 30 minutes daily and/or when sitting, a frequency of: ?Avoid standing for long periods of time. ?Patient to wear own compression stockings every day. - juxta lites both legs 30 ?29mm/Hg ?Wound Treatment ?Electronic Signature(s) ?Signed: 06/19/2021 4:12:10 PM By: Catherine Najjar MD ?Signed: 06/22/2021 12:54:41 PM By: Catherine Mu RN ?Entered By: Catherine Powell on 06/19/2021 15:36:37 ?-------------------------------------------------------------------------------- ?Problem List Details ?Patient Name: ?Date of Service: ?Catherine N, [name] 06/19/2021 3:00 PM ?Medical Record Number: [id number] ?Patient Account Number: [id number] ?Date of Birth/Sex: ?Treating RN: ?09/06/1965 (56 y.o. F) Catherine Powell ?Primary Care Provider: 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 04/20/2021 No Yes ?exposed ?I87.311 Chronic venous hypertension (idiopathic) with ulcer of right lower extremity 06/04/2021 No Yes ?I89.0 Lymphedema, not elsewhere  classified 04/20/2021 No Yes ?Z99.3 Dependence on wheelchair 04/20/2021 No Yes ?E66.01 Morbid (severe) obesity due to excess calories 04/20/2021 No Yes ?B37.31 Acute candidiasis of vulva and vagina 05/04/2021 No Yes ?Inactive Problems ?Resolved Problems ?Electronic Signature(s) ?Signed: 06/19/2021 4:12:10 PM By: Catherine Najjar MD ?Entered By: Catherine Powell on 06/19/2021 15:58:17 ?-------------------------------------------------------------------------------- ?Progress Note Details ?Patient Name: Date of Service: ?Catherine N, [name] 06/19/2021 3:00 PM ?Medical Record Number: [id number] ?Patient Account Number: [id number] ?Date of Birth/Sex: Treating RN: ?02-Jan-1966 (56 y.o. F) Catherine Powell ?Primary Care Provider: [facility], [place] Other Clinician: ?Referring Provider: ?Treating Provider/Extender: Catherine Powell ?[place] ?Weeks in Treatment: 8 ?Subjective ?History of Present Illness (HPI) ?Admission 04/20/2021 ?Ms. Catherine Powell is a 56 year old female with a past medical history of morbid obesity with BMI of 66 and wheelchair dependent due to this, lymphedema ?and hypertension that presents to the clinic for a 12-month history of nonhealing ulcer to the right calf. She states this started spontaneously and has been ?using Unna boots and Xeroform with benefit She saw vein and vascular who recommended bariatric surgery. She currently denies systemic signs of infection. ?2/17; patient presents for follow-up. She tolerated the compression wrap well. She does report increased tenderness to the surrounding wound bed. ?2/24; patient presents for follow-up. She reports taking Keflex with improvement in her symptoms. She has developed some vaginal itching and reports a ?history of yeast infection after taking antibiotics. She has her juxta lite compression today. ?3/27; patient presents for follow-up. Patient states that she has had 2 areas open up to the right lower extremity over the past week. She was started on  Keflex ?by her primary care physician for potential cellulitis. She has been using her juxta light compression daily without issues. She also has a compression stocking ?to the left lower extremity that she has been using

## 2021-06-22 NOTE — Progress Notes (Signed)
Catherine Powell, Catherine Powell ([id number]) ?Visit Report for 06/19/2021 ?Arrival Information Details ?Patient Name: Date of Service: ?[place]. 06/19/2021 3:00 PM ?Medical Record Number: [id number] ?Patient Account Number: [id number] ?Date of Birth/Sex: Treating RN: ?Aug 15, 1965 (56 y.o. F) [name], [name] ?Primary Care Ural Acree: [facility], [place] Other Clinician: ?Referring Ghina Bittinger: ?Treating Kerrilyn Azbill/Extender: Linton Ham ?[place], [place] ?Weeks in Treatment: 8 ?Visit Information History Since Last Visit ?Added or deleted any medications: No ?Patient Arrived: Wheel Chair ?Any new allergies or adverse reactions: No ?Arrival Time: 15:10 ?Had a fall or experienced change in No ?Accompanied By: son ?activities of daily living that may affect ?Transfer Assistance: Manual ?risk of falls: ?Patient Identification Verified: Yes ?Signs or symptoms of abuse/neglect since last visito No ?Secondary Verification Process Completed: Yes ?Hospitalized since last visit: No ?Patient Requires Transmission-Based Precautions: No ?Implantable device outside of the clinic excluding No ?Patient Has Alerts: No ?cellular tissue based products placed in the center ?since last visit: ?Has Dressing in Place as Prescribed: Yes ?Has Compression in Place as Prescribed: Yes ?Pain Present Now: No ?Notes ?patient received lymphedema pumps today. ?Electronic Signature(s) ?Signed: 06/19/2021 4:48:28 PM By: Deon Pilling RN, BSN ?Entered By: Deon Pilling on 06/19/2021 15:14:39 ?-------------------------------------------------------------------------------- ?Clinic Level of Care Assessment Details ?Patient Name: Date of Service: ?[place]. 06/19/2021 3:00 PM ?Medical Record Number: [id number] ?Patient Account Number: [id number] ?Date of Birth/Sex: Treating RN: ?06/15/1965 (56 y.o. [name], [name] ?Primary Care Ziair Penson: [facility], [place] Other Clinician: ?Referring [place] Zou: ?Treating Darrio Bade/Extender: Linton Ham ?[place], [place] ?Weeks in  Treatment: 8 ?Clinic Level of Care Assessment Items ?TOOL 4 Quantity Score ?X- 1 0 ?Use when only an EandM is performed on FOLLOW-UP visit ?ASSESSMENTS - Nursing Assessment / Reassessment ?X- 1 10 ?Reassessment of Co-morbidities (includes updates in patient status) ?X- 1 5 ?Reassessment of Adherence to Treatment Plan ?ASSESSMENTS - Wound and Skin A ssessment / Reassessment ?X - Simple Wound Assessment / Reassessment - one wound 1 5 ?[]  - 0 ?Complex Wound Assessment / Reassessment - multiple wounds ?[]  - 0 ?Dermatologic / Skin Assessment (not related to wound area) ?ASSESSMENTS - Focused Assessment ?X- 1 5 ?Circumferential Edema Measurements - multi extremities ?[]  - 0 ?Nutritional Assessment / Counseling / Intervention ?[]  - 0 ?Lower Extremity Assessment (monofilament, tuning fork, pulses) ?[]  - 0 ?Peripheral Arterial Disease Assessment (using hand held doppler) ?ASSESSMENTS - Ostomy and/or Continence Assessment and Care ?[]  - 0 ?Incontinence Assessment and Management ?[]  - 0 ?Ostomy Care Assessment and Management (repouching, etc.) ?PROCESS - Coordination of Care ?X - Simple Patient / Family Education for ongoing care 1 15 ?[]  - 0 ?Complex (extensive) Patient / Family Education for ongoing care ?X- 1 10 ?Staff obtains Consents, Records, T Results / Process Orders ?est ?[]  - 0 ?Staff telephones HHA, Nursing Homes / Clarify orders / etc ?[]  - 0 ?Routine Transfer to another Facility (non-emergent condition) ?[]  - 0 ?Routine Hospital Admission (non-emergent condition) ?[]  - 0 ?New Admissions / Biomedical engineer / Ordering NPWT Apligraf, etc. ?, ?[]  - 0 ?Emergency Hospital Admission (emergent condition) ?X- 1 10 ?Simple Discharge Coordination ?[]  - 0 ?Complex (extensive) Discharge Coordination ?PROCESS - Special Needs ?[]  - 0 ?Pediatric / Minor Patient Management ?[]  - 0 ?Isolation Patient Management ?[]  - 0 ?Hearing / Language / Visual special needs ?[]  - 0 ?Assessment of Community assistance (transportation,  D/C planning, etc.) ?[]  - 0 ?Additional assistance / Altered mentation ?[]  - 0 ?Support Surface(s) Assessment (bed, cushion, seat, etc.) ?INTERVENTIONS -  Wound Cleansing / Measurement ?X - Simple Wound Cleansing - one wound 1 5 ?[]  - 0 ?Complex Wound Cleansing - multiple wounds ?X- 1 5 ?Wound Imaging (photographs - any number of wounds) ?[]  - 0 ?Wound Tracing (instead of photographs) ?X- 1 5 ?Simple Wound Measurement - one wound ?[]  - 0 ?Complex Wound Measurement - multiple wounds ?INTERVENTIONS - Wound Dressings ?X - Small Wound Dressing one or multiple wounds 1 10 ?[]  - 0 ?Medium Wound Dressing one or multiple wounds ?[]  - 0 ?Large Wound Dressing one or multiple wounds ?[]  - 0 ?Application of Medications - topical ?[]  - 0 ?Application of Medications - injection ?INTERVENTIONS - Miscellaneous ?[]  - 0 ?External ear exam ?[]  - 0 ?Specimen Collection (cultures, biopsies, blood, body fluids, etc.) ?[]  - 0 ?Specimen(s) / Culture(s) sent or taken to Lab for analysis ?[]  - 0 ?Patient Transfer (multiple staff / Civil Service fast streamer / Similar devices) ?[]  - 0 ?Simple Staple / Suture removal (25 or less) ?[]  - 0 ?Complex Staple / Suture removal (26 or more) ?[]  - 0 ?Hypo / Hyperglycemic Management (close monitor of Blood Glucose) ?[]  - 0 ?Ankle / Brachial Index (ABI) - do not check if billed separately ?X- 1 5 ?Vital Signs ?Has the patient been seen at the hospital within the last three years: Yes ?Total Score: 90 ?Level Of Care: New/Established - Level 3 ?Electronic Signature(s) ?Signed: 06/22/2021 12:54:41 PM By: Rhae Hammock RN ?Entered By: Rhae Hammock on 06/19/2021 15:52:37 ?-------------------------------------------------------------------------------- ?Encounter Discharge Information Details ?Patient Name: Date of Service: ?[place]. 06/19/2021 3:00 PM ?Medical Record Number: [id number] ?Patient Account Number: [id number] ?Date of Birth/Sex: Treating RN: ?Jun 23, 1965 (56 y.o. [name], [name] ?Primary Care  Diaz Crago: [facility], [place] Other Clinician: ?Referring Gertrude Bucks: ?Treating Dashel Goines/Extender: Linton Ham ?[place], [place] ?Weeks in Treatment: 8 ?Encounter Discharge Information Items ?Discharge Condition: Stable ?Ambulatory Status: Wheelchair ?Discharge Destination: Home ?Transportation: Private Auto ?Accompanied By: son ?Schedule Follow-up Appointment: Yes ?Clinical Summary of Care: Patient Declined ?Notes ?Pt. rolled her ankle and fell onto her bottom as her son was helping her from the chair to the wheelchair. Fall witnessed by this RN. NO injuries, no complaints ?from pt. Pt. assessed by this RN and DR. ROBSON. NO complaints no pain per pt. NO active bleeding no new wounds. Pt. able to be assisted back to w/c. VS ?stable BP 184/93 HR 81 Resp 22 T emp 97.7 Pain 0/10. ?Electronic Signature(s) ?Signed: 06/22/2021 12:54:41 PM By: Rhae Hammock RN ?Entered By: Rhae Hammock on 06/19/2021 15:56:16 ?-------------------------------------------------------------------------------- ?Lower Extremity Assessment Details ?Patient Name: Date of Service: ?[place]. 06/19/2021 3:00 PM ?Medical Record Number: [id number] ?Patient Account Number: [id number] ?Date of Birth/Sex: Treating RN: ?03-26-1965 (56 y.o. F) [name], [name] ?Primary Care Chalee Hirota: [facility], [place] Other Clinician: ?Referring Aubryn Spinola: ?Treating Abdiaziz Klahn/Extender: Linton Ham ?[place] ?Weeks in Treatment: 8 ?Edema Assessment ?Assessed: [Left: No] [Right: Yes] ?Edema: [Left: Yes] [Right: Yes] ?Calf ?Left: Right: ?Point of Measurement: From Medial Instep 76 cm ?Ankle ?Left: Right: ?Point of Measurement: From Medial Instep 34 cm ?Vascular Assessment ?Pulses: ?Dorsalis Pedis ?Palpable: [Right:No] ?Electronic Signature(s) ?Signed: 06/19/2021 4:48:28 PM By: Deon Pilling RN, BSN ?Entered By: Deon Pilling on 06/19/2021 15:15:53 ?-------------------------------------------------------------------------------- ?Multi Wound Chart Details ?Patient  Name: ?Date of Service: ?[place]. 06/19/2021 3:00 PM ?Medical Record Number: [id number] ?Patient Account Number: [id number] ?Date of Birth/Sex: ?Treating RN: ?29-Aug-1965 (56 y.o. F) [name], [place]

## 2021-06-23 ENCOUNTER — Encounter: Payer: Self-pay | Admitting: Family Medicine

## 2021-06-25 ENCOUNTER — Other Ambulatory Visit: Payer: BC Managed Care – PPO

## 2021-06-26 NOTE — Telephone Encounter (Signed)
Agree to follow up with wound care

## 2021-07-03 ENCOUNTER — Encounter (HOSPITAL_BASED_OUTPATIENT_CLINIC_OR_DEPARTMENT_OTHER): Payer: BC Managed Care – PPO | Admitting: Internal Medicine

## 2021-07-03 DIAGNOSIS — Z6841 Body Mass Index (BMI) 40.0 and over, adult: Secondary | ICD-10-CM | POA: Diagnosis not present

## 2021-07-03 DIAGNOSIS — L97812 Non-pressure chronic ulcer of other part of right lower leg with fat layer exposed: Secondary | ICD-10-CM

## 2021-07-03 DIAGNOSIS — I87311 Chronic venous hypertension (idiopathic) with ulcer of right lower extremity: Secondary | ICD-10-CM | POA: Diagnosis not present

## 2021-07-03 DIAGNOSIS — B3731 Acute candidiasis of vulva and vagina: Secondary | ICD-10-CM | POA: Diagnosis not present

## 2021-07-03 DIAGNOSIS — I890 Lymphedema, not elsewhere classified: Secondary | ICD-10-CM | POA: Diagnosis not present

## 2021-07-03 DIAGNOSIS — Z993 Dependence on wheelchair: Secondary | ICD-10-CM | POA: Diagnosis not present

## 2021-07-03 NOTE — Progress Notes (Addendum)
ALLICYN, DORRIETY ([id number]) ?Visit Report for 07/03/2021 ?Chief Complaint Document Details ?Patient Name: Date of Service: ?Catherine Powell, Catherine Powell. 07/03/2021 11:00 A M ?Medical Record Number: [id number] ?Patient Account Number: [id number] ?Date of Birth/Sex: Treating RN: ?07-31-1965 (56 y.o. [name], [name] ?Primary Care Provider: [facility], [place] Other Clinician: ?Referring Provider: ?Treating Provider/Extender: Kalman Shan ?[place], [place] ?Weeks in Treatment: 10 ?Information Obtained from: Patient ?Chief Complaint ?Right posterior leg wound ?Electronic Signature(s) ?Signed: 07/04/2021 8:43:47 AM By: Kalman Shan DO ?Entered By: Kalman Shan on 07/04/2021 08:38:50 ?-------------------------------------------------------------------------------- ?HPI Details ?Patient Name: Date of Service: ?Catherine Powell, Catherine Powell. 07/03/2021 11:00 A M ?Medical Record Number: [id number] ?Patient Account Number: [id number] ?Date of Birth/Sex: Treating RN: ?15-Apr-1965 (56 y.o. [name], [name] ?Primary Care Provider: [facility], [place] Other Clinician: ?Referring Provider: ?Treating Provider/Extender: Kalman Shan ?[place], [place] ?Weeks in Treatment: 10 ?History of Present Illness ?HPI Description: Admission 04/20/2021 ?Catherine Powell is a 56 year old female with a past medical history of morbid obesity with BMI of 66 and wheelchair dependent due to this, lymphedema ?and hypertension that presents to the clinic for a 65-month history of nonhealing ulcer to the right calf. She states this started spontaneously and has been ?using Unna boots and Xeroform with benefit She saw vein and vascular who recommended bariatric surgery. She currently denies systemic signs of infection. ?2/17; patient presents for follow-up. She tolerated the compression wrap well. She does report increased tenderness to the surrounding wound bed. ?2/24; patient presents for follow-up. She reports taking Keflex with improvement in her symptoms. She  has developed some vaginal itching and reports a ?history of yeast infection after taking antibiotics. She has her juxta lite compression today. ?3/27; patient presents for follow-up. Patient states that she has had 2 areas open up to the right lower extremity over the past week. She was started on Keflex ?by her primary care physician for potential cellulitis. She has been using her juxta light compression daily without issues. She also has a compression stocking ?to the left lower extremity that she has been using since discharge from clinic on 2/24. ?4/4; patient presents for follow-up. She reports that the compression wrap placed in clinic last visit slid down the following day. She has been using silver ?alginate to the wound site and using her juxta lite compression daily. She has no issues or complaints today. She would like a note to be out of work so that she ?can elevate her legs to help heal the wounds. ?4/11; the patient arrives today with the area on her right posterior calf totally closed. She has severe lymphedema but has juxta lite stockings and new ?compression pumps arrive this morning she has not yet used them. ?4/26; patient presents with reopening of her previous wounds on the right posterior calf Over the past week. She states she has been using her juxta lite ?compressions and lymphedema pumps. She is not on a diuretic. ?Electronic Signature(s) ?Signed: 07/04/2021 8:43:47 AM By: Kalman Shan DO ?Entered By: Kalman Shan on 07/04/2021 08:39:52 ?-------------------------------------------------------------------------------- ?Physical Exam Details ?Patient Name: Date of Service: ?Catherine Powell, Catherine Powell. 07/03/2021 11:00 A M ?Medical Record Number: [id number] ?Patient Account Number: [id number] ?Date of Birth/Sex: Treating RN: ?June 17, 1965 (56 y.o. [name], [name] ?Primary Care Provider: [facility], [place] Other Clinician: ?Referring Provider: ?Treating Provider/Extender: Kalman Shan ?[place], [place] ?Weeks in Treatment: 10 ?Constitutional ?respirations regular, non-labored and within target range for patient.Marland Kitchen ?Cardiovascular ?2+ dorsalis pedis/posterior tibialis  pulses. ?Psychiatric ?pleasant and cooperative. ?Notes ?Right lower extremity: T the to the posterior aspect, calf region there are multiple small scattered open wounds with granulation tissue. No signs of infection. ?o ?Poorly controlled lymphedema. ?Electronic Signature(s) ?Signed: 07/04/2021 8:43:47 AM By: Kalman Shan DO ?Entered By: Kalman Shan on 07/04/2021 08:41:17 ?-------------------------------------------------------------------------------- ?Physician Orders Details ?Patient Name: Date of Service: ?Catherine Powell, Catherine Powell. 07/03/2021 11:00 A M ?Medical Record Number: [id number] ?Patient Account Number: [id number] ?Date of Birth/Sex: Treating RN: ?02/26/1966 (56 y.o. [name], [name] ?Primary Care Provider: [facility], [place] Other Clinician: ?Referring Provider: ?Treating Provider/Extender: Kalman Shan ?[place], [place] ?Weeks in Treatment: 10 ?Verbal / Phone Orders: No ?Diagnosis Coding ?Follow-up Appointments ?ppointment in 1 week. - Dr. [facility] and [facility] Room # 9 ?Return A ?Bathing/ Shower/ Hygiene ?May shower with protection but do not get wound dressing(s) wet. ?Edema Control - Lymphedema / SCD / Other ?Lymphedema Pumps. Use Lymphedema pumps on leg(s) 2-3 times a day for 45-60 minutes. If wearing any wraps or hose, do not remove ?them. Continue exercising as instructed. - wound center to order lymphedema pumps from [employer]- someone will call you once approved ?by insurance. ?Elevate legs to the level of the heart or above for 30 minutes daily and/or when sitting, a frequency of: - throughout the day. ?Avoid standing for long periods of time. ?Exercise regularly ?Moisturize legs daily. - apply left leg every night before bed. ?Compression stocking or Garment 20-30 mm/Hg pressure to: - Apply juxtalite HD in the  morning and remove at night to left leg. ?Wound Treatment ?Wound #4 - Lower Leg Wound Laterality: Right, Posterior ?Cleanser: Wound Cleanser (DME) (Generic) Every Other Day/15 Days ?Discharge Instructions: Cleanse the wound with wound cleanser prior to applying a clean dressing using gauze sponges, not tissue or cotton balls. ?Prim Dressing: KerraCel Ag Gelling Fiber Dressing, 4x5 in (silver alginate) (DME) (Generic) Every Other Day/15 Days ?ary ?Discharge Instructions: Apply silver alginate to wound bed as instructed ?Secondary Dressing: Woven Gauze Sponge, Non-Sterile 4x4 in (DME) (Generic) Every Other Day/15 Days ?Discharge Instructions: Apply over primary dressing as directed. ?Secured With: [employer], 4.5x3.1 (in/yd) (DME) (Generic) Every Other Day/15 Days ?Discharge Instructions: Secure with Kerlix as directed. ?Secured With: 41M Medipore Powell Soft Cloth Surgical T ape, 4 x 10 (in/yd) (DME) (Generic) Every Other Day/15 Days ?Discharge Instructions: Secure with tape as directed. ?Compression Stockings: Circaid Juxta Lite Compression Wrap ?Right Leg Compression Amount: 30-40 mmHG ?Discharge Instructions: Apply Circaid Juxta Lite Compression Wrap daily as instructed. Apply first thing in the morning, remove at ?night before bed. ?Electronic Signature(s) ?Signed: 07/04/2021 8:43:47 AM By: Kalman Shan DO ?Previous Signature: 07/03/2021 4:16:50 PM Version By: Rhae Hammock RN ?Previous Signature: 07/03/2021 4:33:51 PM Version By: Kalman Shan DO ?Entered By: Kalman Shan on 07/04/2021 08:41:35 ?-------------------------------------------------------------------------------- ?Problem List Details ?Patient Name: ?Date of Service: ?Catherine Powell, Catherine Powell. 07/03/2021 11:00 A M ?Medical Record Number: [id number] ?Patient Account Number: [id number] ?Date of Birth/Sex: ?Treating RN: ?[birth year] (56 y.o. [name], [name] ?Primary Care Provider: [facility], [place] ?Other Clinician: ?Referring  Provider: ?Treating Provider/Extender: Kalman Shan ?[place], [place] ?Weeks in Treatment: 10 ?Active Problems ?ICD-10 ?Encounter ?Code Description Active Date MDM ?Diagnosis ?[id number] Non-pressure chronic ulcer of other

## 2021-07-04 DIAGNOSIS — S81801A Unspecified open wound, right lower leg, initial encounter: Secondary | ICD-10-CM | POA: Diagnosis not present

## 2021-07-06 NOTE — Progress Notes (Signed)
[name], [name] ([id number]) ?Visit Report for 07/03/2021 ?Arrival Information Details ?Patient Name: Date of Service: ?Catherine Powell, Catherine ERLY H. 07/03/2021 11:00 A M ?Medical Record Number: [id number] ?Patient Account Number: [id number] ?Date of Birth/Sex: Treating RN: ?06/11/1965 (56 y.o. Catherine Powell, Catherine Powell ?Primary Care Chukwuemeka Artola: [facility], [place] Other Clinician: ?Referring Augustus Zurawski: ?Treating Devereaux Grayson/Extender: Kalman Shan ?[place], [place] ?Weeks in Treatment: 10 ?Visit Information History Since Last Visit ?Added or deleted any medications: No ?Patient Arrived: Wheel Chair ?Any new allergies or adverse reactions: No ?Arrival Time: 11:28 ?Had a fall or experienced change in No ?Accompanied By: son ?activities of daily living that may affect ?Transfer Assistance: Manual ?risk of falls: ?Patient Identification Verified: Yes ?Signs or symptoms of abuse/neglect since last visito No ?Secondary Verification Process Completed: Yes ?Hospitalized since last visit: No ?Patient Requires Transmission-Based Precautions: No ?Implantable device outside of the clinic excluding No ?Patient Has Alerts: No ?cellular tissue based products placed in the center ?since last visit: ?Has Dressing in Place as Prescribed: Yes ?Pain Present Now: No ?Electronic Signature(s) ?Signed: 07/03/2021 4:16:50 PM By: Rhae Hammock RN ?Entered By: Rhae Hammock on 07/03/2021 11:28:27 ?-------------------------------------------------------------------------------- ?Clinic Level of Care Assessment Details ?Patient Name: Date of Service: ?Catherine Powell, Catherine ERLY H. 07/03/2021 11:00 A M ?Medical Record Number: [id number] ?Patient Account Number: [id number] ?Date of Birth/Sex: Treating RN: ?12/26/1965 (56 y.o. Catherine Powell, Catherine Powell ?Primary Care Trishelle Devora: [facility], [place] Other Clinician: ?Referring Margrete Delude: ?Treating Varian Innes/Extender: Kalman Shan ?[place], [place] ?Weeks in Treatment: 10 ?Clinic Level of Care Assessment Items ?TOOL 4 Quantity Score ?X- 1  0 ?Use when only an EandM is performed on FOLLOW-UP visit ?ASSESSMENTS - Nursing Assessment / Reassessment ?X- 1 10 ?Reassessment of Co-morbidities (includes updates in patient status) ?X- 1 5 ?Reassessment of Adherence to Treatment Plan ?ASSESSMENTS - Wound and Skin A ssessment / Reassessment ?X - Simple Wound Assessment / Reassessment - one wound 1 5 ?[]  - 0 ?Complex Wound Assessment / Reassessment - multiple wounds ?[]  - 0 ?Dermatologic / Skin Assessment (not related to wound area) ?ASSESSMENTS - Focused Assessment ?[]  - 0 ?Circumferential Edema Measurements - multi extremities ?[]  - 0 ?Nutritional Assessment / Counseling / Intervention ?[]  - 0 ?Lower Extremity Assessment (monofilament, tuning fork, pulses) ?[]  - 0 ?Peripheral Arterial Disease Assessment (using hand held doppler) ?ASSESSMENTS - Ostomy and/or Continence Assessment and Care ?[]  - 0 ?Incontinence Assessment and Management ?[]  - 0 ?Ostomy Care Assessment and Management (repouching, etc.) ?PROCESS - Coordination of Care ?X - Simple Patient / Family Education for ongoing care 1 15 ?[]  - 0 ?Complex (extensive) Patient / Family Education for ongoing care ?X- 1 10 ?Staff obtains Consents, Records, T Results / Process Orders ?est ?[]  - 0 ?Staff telephones HHA, Nursing Homes / Clarify orders / etc ?[]  - 0 ?Routine Transfer to another Facility (non-emergent condition) ?[]  - 0 ?Routine Hospital Admission (non-emergent condition) ?[]  - 0 ?New Admissions / Biomedical engineer / Ordering NPWT Apligraf, etc. ?, ?[]  - 0 ?Emergency Hospital Admission (emergent condition) ?X- 1 10 ?Simple Discharge Coordination ?[]  - 0 ?Complex (extensive) Discharge Coordination ?PROCESS - Special Needs ?[]  - 0 ?Pediatric / Minor Patient Management ?[]  - 0 ?Isolation Patient Management ?[]  - 0 ?Hearing / Language / Visual special needs ?[]  - 0 ?Assessment of Community assistance (transportation, D/C planning, etc.) ?[]  - 0 ?Additional assistance / Altered mentation ?[]  -  0 ?Support Surface(s) Assessment (bed, cushion, seat, etc.) ?INTERVENTIONS - Wound Cleansing / Measurement ?X - Simple Wound Cleansing - one  wound 1 5 ?[]  - 0 ?Complex Wound Cleansing - multiple wounds ?X- 1 5 ?Wound Imaging (photographs - any number of wounds) ?[]  - 0 ?Wound Tracing (instead of photographs) ?X- 1 5 ?Simple Wound Measurement - one wound ?[]  - 0 ?Complex Wound Measurement - multiple wounds ?INTERVENTIONS - Wound Dressings ?X - Small Wound Dressing one or multiple wounds 1 10 ?[]  - 0 ?Medium Wound Dressing one or multiple wounds ?[]  - 0 ?Large Wound Dressing one or multiple wounds ?X- 1 5 ?Application of Medications - topical ?[]  - 0 ?Application of Medications - injection ?INTERVENTIONS - Miscellaneous ?[]  - 0 ?External ear exam ?[]  - 0 ?Specimen Collection (cultures, biopsies, blood, body fluids, etc.) ?[]  - 0 ?Specimen(s) / Culture(s) sent or taken to Lab for analysis ?[]  - 0 ?Patient Transfer (multiple staff / Civil Service fast streamer / Similar devices) ?[]  - 0 ?Simple Staple / Suture removal (25 or less) ?[]  - 0 ?Complex Staple / Suture removal (26 or more) ?[]  - 0 ?Hypo / Hyperglycemic Management (close monitor of Blood Glucose) ?[]  - 0 ?Ankle / Brachial Index (ABI) - do not check if billed separately ?X- 1 5 ?Vital Signs ?Has the patient been seen at the hospital within the last three years: Yes ?Total Score: 90 ?Level Of Care: New/Established - Level 3 ?Electronic Signature(s) ?Signed: 07/03/2021 4:16:50 PM By: Rhae Hammock RN ?Entered By: Rhae Hammock on 07/03/2021 11:52:20 ?-------------------------------------------------------------------------------- ?Encounter Discharge Information Details ?Patient Name: Date of Service: ?Catherine Powell, Catherine ERLY H. 07/03/2021 11:00 A M ?Medical Record Number: [id number] ?Patient Account Number: [id number] ?Date of Birth/Sex: Treating RN: ?1965-11-05 (56 y.o. Catherine Powell, Catherine Powell ?Primary Care Mali Eppard: [facility], [place] Other Clinician: ?Referring Korby Ratay: ?Treating  Bulah Lurie/Extender: Kalman Shan ?[place], [place] ?Weeks in Treatment: 10 ?Encounter Discharge Information Items ?Discharge Condition: Stable ?Ambulatory Status: Wheelchair ?Discharge Destination: Home ?Transportation: Private Auto ?Accompanied By: son ?Schedule Follow-up Appointment: Yes ?Clinical Summary of Care: Patient Declined ?Electronic Signature(s) ?Signed: 07/03/2021 4:16:50 PM By: Rhae Hammock RN ?Entered By: Rhae Hammock on 07/03/2021 11:53:01 ?-------------------------------------------------------------------------------- ?Lower Extremity Assessment Details ?Patient Name: Date of Service: ?Catherine Powell, Catherine ERLY H. 07/03/2021 11:00 A M ?Medical Record Number: [id number] ?Patient Account Number: [id number] ?Date of Birth/Sex: Treating RN: ?November 30, 1965 (56 y.o. Catherine Powell, Catherine Powell ?Primary Care Adreanna Fickel: [facility], [place] Other Clinician: ?Referring Blakelyn Dinges: ?Treating Cathy Crounse/Extender: Kalman Shan ?[place], [place] ?Weeks in Treatment: 10 ?Edema Assessment ?Assessed: [Left: No] [Right: Yes] ?Edema: [Left: Yes] [Right: Yes] ?Calf ?Left: Right: ?Point of Measurement: From Medial Instep 76 cm ?Ankle ?Left: Right: ?Point of Measurement: From Medial Instep 34 cm ?Vascular Assessment ?Pulses: ?Dorsalis Pedis ?Palpable: [Right:Yes] ?Posterior Tibial ?Palpable: [Right:Yes] ?Electronic Signature(s) ?Signed: 07/03/2021 4:16:50 PM By: Rhae Hammock RN ?Entered By: Rhae Hammock on 07/03/2021 11:30:18 ?-------------------------------------------------------------------------------- ?Multi Wound Chart Details ?Patient Name: ?Date of Service: ?Catherine Powell, Catherine ERLY H. 07/03/2021 11:00 A M ?Medical Record Number: [id number] ?Patient Account Number: [id number] ?Date of Birth/Sex: ?Treating RN: ?28-May-1965 (56 y.o. Catherine Powell, Catherine Powell ?Primary Care Jurnie Garritano: [facility], [place] ?Other Clinician: ?Referring Haziel Molner: ?Treating Olivia Pavelko/Extender: Kalman Shan ?[place], [place] ?Weeks in Treatment: 10 ?Vital  Signs ?Height(in): 68 ?Pulse(bpm): 74 ?Weight(lbs): 430 ?Blood Pressure(mmHg): 131/50 ?Body Mass Index(BMI): 65.4 ?Temperature(??F): 97.8 ?Respiratory Rate(breaths/min): 17 ?Photos: [Powell/A:Powell/A] ?Right, Posterior Lower Leg Powell/A

## 2021-07-10 ENCOUNTER — Ambulatory Visit (INDEPENDENT_AMBULATORY_CARE_PROVIDER_SITE_OTHER): Payer: BC Managed Care – PPO | Admitting: Family Medicine

## 2021-07-10 ENCOUNTER — Encounter: Payer: Self-pay | Admitting: Family Medicine

## 2021-07-10 ENCOUNTER — Encounter (HOSPITAL_BASED_OUTPATIENT_CLINIC_OR_DEPARTMENT_OTHER): Payer: BC Managed Care – PPO | Admitting: Internal Medicine

## 2021-07-10 VITALS — BP 127/82 | HR 79 | Wt >= 6400 oz

## 2021-07-10 DIAGNOSIS — L97211 Non-pressure chronic ulcer of right calf limited to breakdown of skin: Secondary | ICD-10-CM

## 2021-07-10 DIAGNOSIS — I10 Essential (primary) hypertension: Secondary | ICD-10-CM | POA: Diagnosis not present

## 2021-07-10 DIAGNOSIS — I83012 Varicose veins of right lower extremity with ulcer of calf: Secondary | ICD-10-CM | POA: Diagnosis not present

## 2021-07-10 MED ORDER — NYSTATIN 100000 UNIT/GM EX CREA: TOPICAL_CREAM | Freq: Two times a day (BID) | CUTANEOUS | 2 refills | Status: DC

## 2021-07-10 MED ORDER — FUROSEMIDE 20 MG PO TABS: 20.0000 mg | ORAL_TABLET | Freq: Every day | ORAL | 0 refills | Status: DC

## 2021-07-10 MED ORDER — FLUOCINOLONE ACETONIDE 0.01 % EX SOLN: Freq: Two times a day (BID) | CUTANEOUS | 0 refills | Status: DC

## 2021-07-10 NOTE — Progress Notes (Signed)
? ? ?  SUBJECTIVE:  ? ?CHIEF COMPLAINT / HPI: Follow-up blood pressure check and lower leg wounds ? ?Presents for follow up for elevated blood pressure. Seen in clinic on 04/03 and chlorthalidone 25 mg was added to her hypertensive regimen..  Since then patient reports improvement blood pressure readings at home.  Denies any weakness, dizziness, headaches, chest pain, shortness of breath.   ? ?Right lower leg wounds ?Continues to have lower extremity edema secondary to chronic venous stasis/lymphedema.  Chronic venous stasis wounds nonhealing. ? ?PERTINENT  PMH / PSH:  ?Obesity class III ?Hypertension ?Hyperlipidemia ?Chronic venous insufficiency ?Chronic lymphedema ?Lipedema ?OSA ?Bilateral knee OA  ?Wheelchair dependent ? ?OBJECTIVE:  ? ?BP 127/82   Pulse 79   Wt (!) 432 lb (196 kg)   LMP 05/15/2012   BMI 65.69 kg/m?   ? ?General: Alert, no acute distress ?Cardio: Normal S1 and S2, RRR, no r/m/g ?Pulm: CTAB, normal work of breathing ?Extremities: Chronic lymphedema, nonhealing wounds on posterior right lower calf ? ? ?ASSESSMENT/PLAN:  ? ?Venous stasis ulcer (HCC) ?Continue follow-up with wound care ?Lasix 20 mg daily to help with lower extremity edema. ? ?Essential hypertension ?Blood pressure at goal today.  Patient is asymptomatic. ?Continue chlorthalidone 25 mg ?We will start Lasix 20 mg daily for lower extremity edema ?Repeat BMet in 1 week ?Follow-up with PCP in 2 weeks ?Return precautions provided ?  ? ?Note provided to return to work 05/15. ? ? ?[name], MD ?[facility]  ?

## 2021-07-10 NOTE — Patient Instructions (Signed)
Thank you for coming to see me today. It was a pleasure. Today we talked about:  ? ?Start Lasix 20 mg daily. ? ?Follow up in 1 week for blood work ? ?Please follow-up with PCP in 2 weeks, can be virtual visit ? ?If you have any questions or concerns, please do not hesitate to call the office at [phone]. ? ?Best,  ? ?[name], MD   ?

## 2021-07-11 ENCOUNTER — Encounter: Payer: Self-pay | Admitting: Family Medicine

## 2021-07-11 MED ORDER — HYDROCODONE-ACETAMINOPHEN 5-325 MG PO TABS: 1.0000 | ORAL_TABLET | Freq: Four times a day (QID) | ORAL | 0 refills | Status: DC | PRN

## 2021-07-11 NOTE — Telephone Encounter (Signed)
Done

## 2021-07-13 ENCOUNTER — Encounter: Payer: Self-pay | Admitting: Family Medicine

## 2021-07-13 NOTE — Assessment & Plan Note (Addendum)
Blood pressure at goal today.  Patient is asymptomatic. ?Continue chlorthalidone 25 mg ?We will start Lasix 20 mg daily for lower extremity edema ?Repeat BMet in 1 week ?Follow-up with PCP in 2 weeks ?Return precautions provided ?

## 2021-07-13 NOTE — Assessment & Plan Note (Signed)
Continue follow-up with wound care ?Lasix 20 mg daily to help with lower extremity edema. ?

## 2021-07-19 ENCOUNTER — Encounter (HOSPITAL_BASED_OUTPATIENT_CLINIC_OR_DEPARTMENT_OTHER): Payer: BC Managed Care – PPO | Admitting: Internal Medicine

## 2021-07-23 ENCOUNTER — Encounter: Payer: Self-pay | Admitting: Family Medicine

## 2021-07-23 ENCOUNTER — Ambulatory Visit: Payer: BC Managed Care – PPO | Admitting: Family Medicine

## 2021-07-23 VITALS — BP 134/66 | HR 84 | Ht 68.0 in | Wt >= 6400 oz

## 2021-07-23 DIAGNOSIS — I890 Lymphedema, not elsewhere classified: Secondary | ICD-10-CM

## 2021-07-23 DIAGNOSIS — I10 Essential (primary) hypertension: Secondary | ICD-10-CM | POA: Diagnosis not present

## 2021-07-23 DIAGNOSIS — I83012 Varicose veins of right lower extremity with ulcer of calf: Secondary | ICD-10-CM | POA: Diagnosis not present

## 2021-07-23 DIAGNOSIS — L97211 Non-pressure chronic ulcer of right calf limited to breakdown of skin: Secondary | ICD-10-CM

## 2021-07-23 DIAGNOSIS — G43109 Migraine with aura, not intractable, without status migrainosus: Secondary | ICD-10-CM | POA: Diagnosis not present

## 2021-07-23 MED ORDER — HYDROCODONE-ACETAMINOPHEN 5-325 MG PO TABS: 1.0000 | ORAL_TABLET | Freq: Two times a day (BID) | ORAL | 0 refills | Status: DC | PRN

## 2021-07-23 MED ORDER — TOPIRAMATE 100 MG PO TABS: 100.0000 mg | ORAL_TABLET | Freq: Two times a day (BID) | ORAL | 3 refills | Status: DC

## 2021-07-23 MED ORDER — QUINAPRIL HCL 20 MG PO TABS: 20.0000 mg | ORAL_TABLET | Freq: Every day | ORAL | 3 refills | Status: DC

## 2021-07-23 MED ORDER — FUROSEMIDE 40 MG PO TABS: 40.0000 mg | ORAL_TABLET | Freq: Every day | ORAL | 0 refills | Status: DC

## 2021-07-23 MED ORDER — PROPRANOLOL HCL 40 MG PO TABS
80.0000 mg | ORAL_TABLET | Freq: Two times a day (BID) | ORAL | 3 refills | Status: DC
Start: 2021-07-23 — End: 2022-02-18

## 2021-07-23 NOTE — Patient Instructions (Addendum)
Thank you for coming to see me today. It was a pleasure.  ? ?Increase Lasix to 40 mg daily ? ?We will get some labs today.  If they are abnormal or we need to do something about them, I will call you.  If they are normal, I will send you a message on MyChart (if it is active) or a letter in the mail.  If you don't hear from [place] in 2 weeks, please call the office at the number below.  ? ?Please follow-up with PCP in 4 weeks or sooner if needIf you would like some assistance to help quit smoking please schedule an appointment with me to discuss the many ways that we can assist you if and when you decide you are ready to take the next step toward a smoke free lifestyle. ed ? ?If you have any questions or concerns, please do not hesitate to call the office at [phone]. ? ?Best,  ? ?[name], MD   ?

## 2021-07-23 NOTE — Progress Notes (Signed)
    SUBJECTIVE:   CHIEF COMPLAINT / HPI: follow up HTN/wounds  HTN BP at home 130's.  Tolerating addition of Lasix.  Denies any chest pain, shortness of breath.  Chronic Lymphedema Addition of lasix at last visit for weeping of lower extremities.  Continues to have increased weeping and non healing right lower posterior venous stasis wound.  Missed WOCN appointment last week due to pain.  Has follow up next week.  Pain increasing with non healing wound.  On chronic Norco for bilateral knee pain secondary to OA.  Takes 2 tabs daily as needed but typically runs out of medication before next refill.  Pain has affected her ADLs' and limits her from helping in her care.    PERTINENT  PMH / PSH:  HTN Chronic Lymphedema Lipedema Chronic Venous Insufficiency Venous Stasis dermatitis/Non healing wound  OBJECTIVE:   BP 134/66   Pulse 84   Ht 5\' 8"  (1.727 m)   Wt (!) 436 lb (197.8 kg)   LMP 05/15/2012   SpO2 97%   BMI 66.29 kg/m    General: Alert, no acute distress Cardio: Normal S1 and S2, RRR, no r/m/g Pulm: CTAB, normal work of breathing Lower extremities edematous.  Right lower calf wound     ASSESSMENT/PLAN:   Essential hypertension Improving with addition of Chlorthalidone  BMet today Continue current medications Monitor BP at home Repeat BMet at next visit Follow up with PCP in 4 weeks or sooner if needed Strict return precautions provided   Lymphedema Worsening weeping from lower extremities. Had started Lasix 20 mg daily at last visit without much improvement Increase Lasix 40 mg daily Bmet today Monitor blood pressure Follow up with WOCN next week Follow up with PCP in 4 weeks or sooner if no improvement Strict return precautions provided  Venous stasis ulcer (HCC) Non healing and worsening pain Continue Norco 5/325 mg BID prn x 60 tablets Narcan spray for prophylaxis  Follow up with WOCN as scheduled Follow up in 3 weeks  Migraine Stable.  Had  previously been evaluated by Neuro. Refill Topamax Refill Propranolol  Recommend Colonoscopy Recommend Shingles vaccine Recommend Mammogram   07/15/2012, MD [facility]

## 2021-07-24 ENCOUNTER — Encounter: Payer: Self-pay | Admitting: Family Medicine

## 2021-07-24 LAB — BASIC METABOLIC PANEL
BUN/Creatinine Ratio: 11 (ref 9–23)
BUN: 8 mg/dL (ref 6–24)
CO2: 25 mmol/L (ref 20–29)
Calcium: 8.8 mg/dL (ref 8.7–10.2)
Chloride: 103 mmol/L (ref 96–106)
Creatinine, Ser: 0.73 mg/dL (ref 0.57–1.00)
Glucose: 116 mg/dL — ABNORMAL HIGH (ref 70–99)
Potassium: 4.3 mmol/L (ref 3.5–5.2)
Sodium: 141 mmol/L (ref 134–144)
eGFR: 96 mL/min/{1.73_m2} (ref >59)

## 2021-07-24 MED ORDER — NALOXONE HCL 4 MG/0.1ML NA LIQD: NASAL | 1 refills | Status: AC

## 2021-07-24 NOTE — Assessment & Plan Note (Addendum)
Improving with addition of Chlorthalidone  ?BMet today ?Continue current medications ?Monitor BP at home ?Repeat BMet at next visit ?Follow up with PCP in 4 weeks or sooner if needed ?Strict return precautions provided ? ?

## 2021-07-24 NOTE — Assessment & Plan Note (Signed)
Non healing and worsening pain ?Continue Norco 5/325 mg BID prn x 60 tablets ?Narcan spray for prophylaxis  ?Follow up with WOCN as scheduled ?Follow up in 3 weeks ?

## 2021-07-24 NOTE — Assessment & Plan Note (Signed)
Stable.  Had previously been evaluated by Neuro. ?Refill Topamax ?Refill Propranolol ? ?

## 2021-07-24 NOTE — Assessment & Plan Note (Signed)
Worsening weeping from lower extremities. Had started Lasix 20 mg daily at last visit without much improvement ?Increase Lasix 40 mg daily ?Bmet today ?Monitor blood pressure ?Follow up with WOCN next week ?Follow up with PCP in 4 weeks or sooner if no improvement ?Strict return precautions provided ?

## 2021-07-26 ENCOUNTER — Encounter (HOSPITAL_BASED_OUTPATIENT_CLINIC_OR_DEPARTMENT_OTHER): Payer: BC Managed Care – PPO | Attending: Internal Medicine | Admitting: Internal Medicine

## 2021-07-26 DIAGNOSIS — I87311 Chronic venous hypertension (idiopathic) with ulcer of right lower extremity: Secondary | ICD-10-CM

## 2021-07-26 DIAGNOSIS — I890 Lymphedema, not elsewhere classified: Secondary | ICD-10-CM | POA: Diagnosis not present

## 2021-07-26 DIAGNOSIS — Z6841 Body Mass Index (BMI) 40.0 and over, adult: Secondary | ICD-10-CM | POA: Insufficient documentation

## 2021-07-26 DIAGNOSIS — L97812 Non-pressure chronic ulcer of other part of right lower leg with fat layer exposed: Secondary | ICD-10-CM | POA: Diagnosis not present

## 2021-07-26 DIAGNOSIS — I87313 Chronic venous hypertension (idiopathic) with ulcer of bilateral lower extremity: Secondary | ICD-10-CM | POA: Diagnosis not present

## 2021-07-26 DIAGNOSIS — L97822 Non-pressure chronic ulcer of other part of left lower leg with fat layer exposed: Secondary | ICD-10-CM

## 2021-07-26 DIAGNOSIS — I10 Essential (primary) hypertension: Secondary | ICD-10-CM | POA: Insufficient documentation

## 2021-07-26 DIAGNOSIS — I87312 Chronic venous hypertension (idiopathic) with ulcer of left lower extremity: Secondary | ICD-10-CM

## 2021-07-26 DIAGNOSIS — Z993 Dependence on wheelchair: Secondary | ICD-10-CM | POA: Insufficient documentation

## 2021-07-26 NOTE — Progress Notes (Signed)
Catherine Powell, Catherine Powell ([id number]) Visit Report for 07/26/2021 Arrival Information Details Patient Name: Date of Service: Catherine Powell 07/26/2021 2:30 PM Medical Record Number: [id number] Patient Account Number: [id number] Date of Birth/Sex: Treating RN: 1965-10-01 (56 y.o. Toniann Fail Primary Care Addie Cederberg: [facility], [place] Other Clinician: Referring Oval Cavazos: Treating Creed Kail/Extender: Harland German LSH, [place] Weeks in Treatment: 13 Visit Information History Since Last Visit Added or deleted any medications: No Patient Arrived: Wheel Chair Any new allergies or adverse reactions: No Arrival Time: 15:05 Had a fall or experienced change in No Accompanied By: son activities of daily living that may affect Transfer Assistance: Manual risk of falls: Patient Identification Verified: Yes Signs or symptoms of abuse/neglect since last visito No Secondary Verification Process Completed: Yes Hospitalized since last visit: No Patient Requires Transmission-Based Precautions: No Implantable device outside of the clinic excluding No Patient Has Alerts: No cellular tissue based products placed in the center since last visit: Has Dressing in Place as Prescribed: Yes Has Compression in Place as Prescribed: Yes Pain Present Now: Yes Electronic Signature(s) Signed: 07/26/2021 4:40:18 PM By: [name] RN Entered By: [name] on 07/26/2021 15:05:53 -------------------------------------------------------------------------------- Compression Therapy Details Patient Name: Date of Service: Catherine Powell. 07/26/2021 2:30 PM Medical Record Number: [id number] Patient Account Number: [id number] Date of Birth/Sex: Treating RN: [birth year] (56 y.o. Toniann Fail Primary Care Issaac Shipper: [facility], [place] Other Clinician: Referring Jenica Costilow: Treating Massa Pe/Extender: Harland German LSH, [place] Weeks in Treatment: 13 Compression Therapy Performed for  Wound Assessment: Wound #4 Right,Posterior Lower Leg Performed By: Clinician [name], RN Compression Type: Three Layer Post Procedure Diagnosis Same as Pre-procedure Electronic Signature(s) Signed: 07/26/2021 4:40:18 PM By: [name] RN Entered By: [name] on 07/26/2021 15:58:52 -------------------------------------------------------------------------------- Compression Therapy Details Patient Name: Date of Service: Catherine Powell. 07/26/2021 2:30 PM Medical Record Number: [id number] Patient Account Number: [id number] Date of Birth/Sex: Treating RN: [birth year] (56 y.o. Toniann Fail Primary Care Wheeler Incorvaia: [facility], [place] Other Clinician: Referring Nasreen Goedecke: Treating Neoma Uhrich/Extender: Harland German LSH, [place] Weeks in Treatment: 13 Compression Therapy Performed for Wound Assessment: Wound #5 Left,Posterior Lower Leg Performed By: Clinician [name], RN Compression Type: Three Layer Post Procedure Diagnosis Same as Pre-procedure Electronic Signature(s) Signed: 07/26/2021 4:40:18 PM By: [name] RN Entered By: [name] on 07/26/2021 15:58:52 -------------------------------------------------------------------------------- Compression Therapy Details Patient Name: Date of Service: Catherine Powell. 07/26/2021 2:30 PM Medical Record Number: [id number] Patient Account Number: [id number] Date of Birth/Sex: Treating RN: [birth year] (56 y.o. Toniann Fail Primary Care Shelanda Duvall: [facility], [place] Other Clinician: Referring Ganon Demasi: Treating Fabrizio Filip/Extender: Harland German LSH, [place] Weeks in Treatment: 13 Compression Therapy Performed for Wound Assessment: Wound #6 Left,Lateral Lower Leg Performed By: Clinician [name], RN Compression Type: Three Layer Post Procedure Diagnosis Same as Pre-procedure Electronic Signature(s) Signed: 07/26/2021 4:40:18 PM By: [name] RN Entered  By: [name] on 07/26/2021 15:58:52 -------------------------------------------------------------------------------- Encounter Discharge Information Details Patient Name: Date of Service: Catherine Powell. 07/26/2021 2:30 PM Medical Record Number: [id number] Patient Account Number: [id number] Date of Birth/Sex: Treating RN: 05/21/1965 (56 y.o. Toniann Fail Primary Care Marice Angelino: [facility], [place] Other Clinician: Referring Vyncent Overby: Treating Eryk Beavers/Extender: Harland German LSH, [place] Weeks in Treatment: 88 Encounter Discharge Information Items Discharge Condition: Stable Ambulatory Status: Wheelchair Discharge Destination: Home Transportation: Private Auto Accompanied By: son Schedule Follow-up Appointment: Yes Clinical Summary of Care:  Patient Declined Electronic Signature(s) Signed: 07/26/2021 4:40:18 PM By: [name] RN Entered By: [name] on 07/26/2021 16:04:09 -------------------------------------------------------------------------------- Lower Extremity Assessment Details Patient Name: Date of Service: Catherine Powell 07/26/2021 2:30 PM Medical Record Number: [id number] Patient Account Number: [id number] Date of Birth/Sex: Treating RN: 17-Sep-1965 (56 y.o. Toniann Fail Primary Care Ercel Pepitone: [facility], [place] Other Clinician: Referring Kampbell Holaway: Treating Tiwan Schnitker/Extender: Harland German LSH, [place] Weeks in Treatment: 13 Edema Assessment Assessed: [Left: No] [Right: No] Edema: [Left: Yes] [Right: Yes] Calf Left: Right: Point of Measurement: From Medial Instep 67 cm 75 cm Ankle Left: Right: Point of Measurement: From Medial Instep 37 cm 34 cm Vascular Assessment Pulses: Dorsalis Pedis Palpable: [Right:Yes] Posterior Tibial Palpable: [Right:Yes] Electronic Signature(s) Signed: 07/26/2021 4:40:18 PM By: [name] RN Entered By: [name] on 07/26/2021  15:13:14 -------------------------------------------------------------------------------- Multi Wound Chart Details Patient Name: Date of Service: Catherine Powell. 07/26/2021 2:30 PM Medical Record Number: [id number] Patient Account Number: [id number] Date of Birth/Sex: Treating RN: Mar 13, 1965 (56 y.o. Catherine Powell, Catherine Powell Primary Care Siera Beyersdorf: [facility], [place] Other Clinician: Referring Quanisha Drewry: Treating Lillieanna Tuohy/Extender: Harland German LSH, TA [place] Weeks in Treatment: 13 Vital Signs Height(in): 68 Pulse(bpm): 74 Weight(lbs): 430 Blood Pressure(mmHg): 131/78 Body Mass Index(BMI): 65.4 Temperature(F): 98.6 Respiratory Rate(breaths/min): 17 Photos: Right, Posterior Lower Leg Left, Posterior Lower Leg Left, Lateral Lower Leg Wound Location: Gradually Appeared Gradually Appeared Gradually Appeared Wounding Event: Venous Leg Ulcer Lymphedema Lymphedema Primary Etiology: Lymphedema, Asthma, Hypertension, Lymphedema, Asthma, Hypertension, Lymphedema, Asthma, Hypertension, Comorbid History: Peripheral Venous Disease, Peripheral Venous Disease, Peripheral Venous Disease, Osteoarthritis Osteoarthritis Osteoarthritis 07/03/2021 07/26/2021 07/26/2021 Date Acquired: 3 0 0 Weeks of Treatment: Open Open Open Wound Status: No No No Wound Recurrence: Yes Yes No Clustered Wound: 4 4 Powell/A Clustered Quantity: 2x5x0.2 3x3x0.1 0.5x0.7x0.4 Measurements L x W x D (cm) 7.854 7.069 0.275 A (cm) : rea 1.571 0.707 0.11 Volume (cm) : 33.30% 0.00% 0.00% % Reduction in Area: 33.30% 0.00% 0.00% % Reduction in Volume: Full Thickness Without Exposed Full Thickness With Exposed Support Unclassifiable Classification: Support Structures Structures Medium Large Medium Exudate Amount: Serosanguineous Serosanguineous Serosanguineous Exudate Type: red, brown red, brown red, brown Exudate Color: Distinct, outline attached Distinct, outline attached Distinct, outline attached Wound  Margin: Medium (34-66%) Large (67-100%) None Present (0%) Granulation Amount: Red, Pink Red, Pink Powell/A Granulation Quality: Medium (34-66%) Small (1-33%) Large (67-100%) Necrotic Amount: Fat Layer (Subcutaneous Tissue): Yes Fat Layer (Subcutaneous Tissue): Yes Fascia: No Exposed Structures: Fascia: No Fascia: No Fat Layer (Subcutaneous Tissue): No Tendon: No Tendon: No Tendon: No Muscle: No Muscle: No Muscle: No Joint: No Joint: No Joint: No Bone: No Bone: No Bone: No None None None Epithelialization: Compression Therapy Compression Therapy Compression Therapy Procedures Performed: Treatment Notes Wound #4 (Lower Leg) Wound Laterality: Right, Posterior Cleanser Wound Cleanser Discharge Instruction: Cleanse the wound with wound cleanser prior to applying a clean dressing using gauze sponges, not tissue or cotton balls. Peri-Wound Care Zinc Oxide Ointment 30g tube Discharge Instruction: Apply Zinc Oxide to periwound with each dressing change Sween Lotion (Moisturizing lotion) Discharge Instruction: Apply moisturizing lotion as directed Topical Primary Dressing KerraCel Ag Gelling Fiber Dressing, 4x5 in (silver alginate) Discharge Instruction: Apply silver alginate to wound bed as instructed Secondary Dressing ABD Pad, 5x9 Discharge Instruction: Apply over primary dressing as directed. Woven Gauze Sponge, Non-Sterile 4x4 in Discharge Instruction: Apply over primary dressing as directed. Secured With Compression Wrap ThreePress (3 layer compression wrap) Discharge Instruction: Apply three  layer compression as directed. Compression Stockings Circaid Juxta Lite Compression Wrap Quantity: 1 Right Leg Compression Amount: 30-40 mmHg Discharge Instruction: Apply Circaid Juxta Lite Compression Wrap daily as instructed. Apply first thing in the morning, remove at night before bed. Add-Ons Wound #5 (Lower Leg) Wound Laterality: Left, Posterior Cleanser Wound  Cleanser Discharge Instruction: Cleanse the wound with wound cleanser prior to applying a clean dressing using gauze sponges, not tissue or cotton balls. Peri-Wound Care Zinc Oxide Ointment 30g tube Discharge Instruction: Apply Zinc Oxide to periwound with each dressing change Sween Lotion (Moisturizing lotion) Discharge Instruction: Apply moisturizing lotion as directed Topical Primary Dressing KerraCel Ag Gelling Fiber Dressing, 4x5 in (silver alginate) Discharge Instruction: Apply silver alginate to wound bed as instructed Secondary Dressing ABD Pad, 5x9 Discharge Instruction: Apply over primary dressing as directed. Woven Gauze Sponge, Non-Sterile 4x4 in Discharge Instruction: Apply over primary dressing as directed. Secured With Compression Wrap ThreePress (3 layer compression wrap) Discharge Instruction: Apply three layer compression as directed. Compression Stockings Add-Ons Wound #6 (Lower Leg) Wound Laterality: Left, Lateral Cleanser Wound Cleanser Discharge Instruction: Cleanse the wound with wound cleanser prior to applying a clean dressing using gauze sponges, not tissue or cotton balls. Peri-Wound Care Zinc Oxide Ointment 30g tube Discharge Instruction: Apply Zinc Oxide to periwound with each dressing change Sween Lotion (Moisturizing lotion) Discharge Instruction: Apply moisturizing lotion as directed Topical Primary Dressing KerraCel Ag Gelling Fiber Dressing, 4x5 in (silver alginate) Discharge Instruction: Apply silver alginate to wound bed as instructed Secondary Dressing ABD Pad, 5x9 Discharge Instruction: Apply over primary dressing as directed. Woven Gauze Sponge, Non-Sterile 4x4 in Discharge Instruction: Apply over primary dressing as directed. Secured With Compression Wrap ThreePress (3 layer compression wrap) Discharge Instruction: Apply three layer compression as directed. Compression Stockings Add-Ons Electronic Signature(s) Signed: 07/26/2021  4:35:19 PM By: Geralyn Corwin DO Signed: 07/26/2021 4:40:18 PM By: [name] RN Entered By: Geralyn Corwin on 07/26/2021 16:06:37 -------------------------------------------------------------------------------- Multi-Disciplinary Care Plan Details Patient Name: Date of Service: Catherine Hock H. 07/26/2021 2:30 PM Medical Record Number: [id number] Patient Account Number: [id number] Date of Birth/Sex: Treating RN: [birth year] (56 y.o. Catherine Powell, Catherine Powell Primary Care Suliman Termini: [facility], [place] Other Clinician: Referring Shon Mansouri: Treating Xaiden Fleig/Extender: Harland German LSH, TA [place] Weeks in Treatment: 13 Multidisciplinary Care Plan reviewed with physician Active Inactive Wound/Skin Impairment Nursing Diagnoses: Impaired tissue integrity Knowledge deficit related to ulceration/compromised skin integrity Goals: Patient/caregiver will verbalize understanding of skin care regimen Date Initiated: 04/20/2021 Target Resolution Date: 08/11/2021 Goal Status: Active Interventions: Assess patient/caregiver ability to obtain necessary supplies Assess patient/caregiver ability to perform ulcer/skin care regimen upon admission and as needed Assess ulceration(s) every visit Provide education on ulcer and skin care Notes: Electronic Signature(s) Signed: 07/26/2021 4:40:18 PM By: [name] RN Entered By: [name] on 07/26/2021 16:02:46 -------------------------------------------------------------------------------- Pain Assessment Details Patient Name: Date of Service: Catherine Powell. 07/26/2021 2:30 PM Medical Record Number: [id number] Patient Account Number: [id number] Date of Birth/Sex: Treating RN: 03-21-1965 (56 y.o. Toniann Fail Primary Care Amadi Yoshino: [facility], [place] Other Clinician: Referring Caly Pellum: Treating Domenica Weightman/Extender: Harland German LSH, [place] Weeks in Treatment: 13 Active Problems Location of Pain Severity and  Description of Pain Patient Has Paino Yes Site Locations Pain Location: Generalized Pain, Pain in Ulcers With Dressing Change: Yes Duration of the Pain. Constant / Intermittento Intermittent Rate the pain. Current Pain Level: 6 Worst Pain Level: 10 Least Pain Level: 0 Tolerable Pain Level: 6 Character of Pain Describe  the Pain: Aching Pain Management and Medication Current Pain Management: Electronic Signature(s) Signed: 07/26/2021 4:40:18 PM By: Fonnie MuBreedlove, [name] RN Entered By: Fonnie MuBreedlove, Catherine Powell on 07/26/2021 15:09:22 -------------------------------------------------------------------------------- Patient/Caregiver Education Details Patient Name: Date of Service: Catherine FlakesBRIGMA Powell, Catherine ERLY H. 5/18/2023andnbsp2:30 PM Medical Record Number: [id number] Patient Account Number: [id number] Date of Birth/Gender: Treating RN: 06/30/1965 (56 y.o. Toniann FailF) Breedlove, Catherine Powell [facility] [place] Other Clinician: Referring Physician: Treating Physician/Extender: Harland GermanHoffman, Jessica [place] LSH, [place] Weeks in Treatment: 13 Education Assessment Education Provided To: Patient Education Topics Provided Wound/Skin Impairment: Methods: Explain/Verbal Responses: Reinforcements needed, State content correctly [employer]-F[employer] CompanyElectronic Signature(s) Signed: 07/26/2021 4:40:18 PM By: Fonnie MuBreedlove, [name] RN Entered By: Fonnie MuBreedlove, Catherine Powell on 07/26/2021 15:31:25 -------------------------------------------------------------------------------- Wound Assessment Details Patient Name: Date of Service: Catherine FlakesBRIGMA Powell, Catherine ERLY H. 07/26/2021 2:30 PM Medical Record Number: [id number] Patient Account Number: [id number] Date of Birth/Sex: Treating RN: 08/13/1965 (56 y.o. Toniann FailF) Breedlove, Catherine Powell [facility]away[facility] [place] Other Clinician: Referring Mildred Bollard: Treating Wilbern Pennypacker/Extender: Harland GermanHoffman, Jessica [place] [facility], [place] Weeks in Treatment: 13 Wound Status Wound Number: 4 Primary Venous Leg Ulcer Etiology: Wound  Location: Right, Posterior Lower Leg Wound Open Wounding Event: Gradually Appeared Status: Date Acquired: 07/03/2021 Comorbid Lymphedema, Asthma, Hypertension, Peripheral Venous Weeks Of Treatment: 3 History: Disease, Osteoarthritis Clustered Wound: Yes Photos Wound Measurements Length: (cm) 2 Width: (cm) 5 Depth: (cm) 0.2 Clustered Quantity: 4 Area: (cm) 7.854 Volume: (cm) 1.571 % Reduction in Area: 33.3% % Reduction in Volume: 33.3% Epithelialization: None Tunneling: No Undermining: No Wound Description Classification: Full Thickness Without Exposed Support Structures Wound Margin: Distinct, outline attached Exudate Amount: Medium Exudate Type: Serosanguineous Exudate Color: red, brown Foul Odor After Cleansing: No Slough/Fibrino Yes Wound Bed Granulation Amount: Medium (34-66%) Exposed Structure Granulation Quality: Red, Pink Fascia Exposed: No Necrotic Amount: Medium (34-66%) Fat Layer (Subcutaneous Tissue) Exposed: Yes Necrotic Quality: Adherent Slough Tendon Exposed: No Muscle Exposed: No Joint Exposed: No Bone Exposed: No Treatment Notes Wound #4 (Lower Leg) Wound Laterality: Right, Posterior Cleanser Wound Cleanser Discharge Instruction: Cleanse the wound with wound cleanser prior to applying a clean dressing using gauze sponges, not tissue or cotton balls. Peri-Wound Care Zinc Oxide Ointment 30g tube Discharge Instruction: Apply Zinc Oxide to periwound with each dressing change Sween Lotion (Moisturizing lotion) Discharge Instruction: Apply moisturizing lotion as directed Topical Primary Dressing KerraCel Ag Gelling Fiber Dressing, 4x5 in (silver alginate) Discharge Instruction: Apply silver alginate to wound bed as instructed Secondary Dressing ABD Pad, 5x9 Discharge Instruction: Apply over primary dressing as directed. Woven Gauze Sponge, Non-Sterile 4x4 in Discharge Instruction: Apply over primary dressing as directed. Secured With Compression  Wrap ThreePress (3 layer compression wrap) Discharge Instruction: Apply three layer compression as directed. Compression Stockings Circaid Juxta Lite Compression Wrap Quantity: 1 Right Leg Compression Amount: 30-40 mmHg Discharge Instruction: Apply Circaid Juxta Lite Compression Wrap daily as instructed. Apply first thing in the morning, remove at night before bed. Add-Ons Electronic Signature(s) Signed: 07/26/2021 4:40:18 PM By: Fonnie MuBreedlove, [name] RN Entered By: Fonnie MuBreedlove, Catherine Powell on 07/26/2021 15:24:18 -------------------------------------------------------------------------------- Wound Assessment Details Patient Name: Date of Service: Catherine FlakesBRIGMA Powell, Catherine ERLY H. 07/26/2021 2:30 PM Medical Record Number: [id number] Patient Account Number: [id number] Date of Birth/Sex: Treating RN: 05/24/1965 (56 y.o. Toniann FailF) Breedlove, Catherine Powell Primary Care Vincient Vanaman: [facility] [place] Other Clinician: Referring Amyra Vantuyl: Treating Kento Gossman/Extender: Harland GermanHoffman, Jessica [place] [facility], [place] Weeks in Treatment: 13 Wound Status Wound Number: 5 Primary Lymphedema Etiology: Wound Location: Left, Posterior Lower Leg Wound Open Wounding Event: Gradually Appeared Status: Date Acquired:  07/26/2021 Comorbid Lymphedema, Asthma, Hypertension, Peripheral Venous Weeks Of Treatment: 0 History: Disease, Osteoarthritis Clustered Wound: Yes Photos Wound Measurements Length: (cm) 3 Width: (cm) 3 Depth: (cm) 0.1 Clustered Quantity: 4 Area: (cm) 7.069 Volume: (cm) 0.707 % Reduction in Area: 0% % Reduction in Volume: 0% Epithelialization: None Tunneling: No Undermining: No Wound Description Classification: Full Thickness With Exposed Support Structures Wound Margin: Distinct, outline attached Exudate Amount: Large Exudate Type: Serosanguineous Exudate Color: red, brown Foul Odor After Cleansing: No Slough/Fibrino Yes Wound Bed Granulation Amount: Large (67-100%) Exposed Structure Granulation Quality: Red, Pink Fascia  Exposed: No Necrotic Amount: Small (1-33%) Fat Layer (Subcutaneous Tissue) Exposed: Yes Necrotic Quality: Adherent Slough Tendon Exposed: No Muscle Exposed: No Joint Exposed: No Bone Exposed: No Treatment Notes Wound #5 (Lower Leg) Wound Laterality: Left, Posterior Cleanser Wound Cleanser Discharge Instruction: Cleanse the wound with wound cleanser prior to applying a clean dressing using gauze sponges, not tissue or cotton balls. Peri-Wound Care Zinc Oxide Ointment 30g tube Discharge Instruction: Apply Zinc Oxide to periwound with each dressing change Sween Lotion (Moisturizing lotion) Discharge Instruction: Apply moisturizing lotion as directed Topical Primary Dressing KerraCel Ag Gelling Fiber Dressing, 4x5 in (silver alginate) Discharge Instruction: Apply silver alginate to wound bed as instructed Secondary Dressing ABD Pad, 5x9 Discharge Instruction: Apply over primary dressing as directed. Woven Gauze Sponge, Non-Sterile 4x4 in Discharge Instruction: Apply over primary dressing as directed. Secured With Compression Wrap ThreePress (3 layer compression wrap) Discharge Instruction: Apply three layer compression as directed. Compression Stockings Add-Ons Electronic Signature(s) Signed: 07/26/2021 4:40:18 PM By: [name] RN Entered By: [name] on 07/26/2021 15:25:10 -------------------------------------------------------------------------------- Wound Assessment Details Patient Name: Date of Service: Catherine Powell. 07/26/2021 2:30 PM Medical Record Number: [id number] Patient Account Number: [id number] Date of Birth/Sex: Treating RN: [birth year] (56 y.o. Catherine Powell, Catherine Powell Primary Care Zyeir Dymek: [facility], [place] Other Clinician: Referring Mikias Lanz: Treating Mahir Prabhakar/Extender: Harland German LSH, [place] Weeks in Treatment: 13 Wound Status Wound Number: 6 Primary Lymphedema Etiology: Wound Location: Left, Lateral Lower Leg Wound  Open Wounding Event: Gradually Appeared Status: Date Acquired: 07/26/2021 Comorbid Lymphedema, Asthma, Hypertension, Peripheral Venous Weeks Of Treatment: 0 History: Disease, Osteoarthritis Clustered Wound: No Photos Wound Measurements Length: (cm) 0.5 Width: (cm) 0.7 Depth: (cm) 0.4 Area: (cm) 0.275 Volume: (cm) 0.11 % Reduction in Area: 0% % Reduction in Volume: 0% Epithelialization: None Tunneling: No Undermining: No Wound Description Classification: Unclassifiable Wound Margin: Distinct, outline attached Exudate Amount: Medium Exudate Type: Serosanguineous Exudate Color: red, brown Foul Odor After Cleansing: No Slough/Fibrino Yes Wound Bed Granulation Amount: None Present (0%) Exposed Structure Necrotic Amount: Large (67-100%) Fascia Exposed: No Necrotic Quality: Adherent Slough Fat Layer (Subcutaneous Tissue) Exposed: No Tendon Exposed: No Muscle Exposed: No Joint Exposed: No Bone Exposed: No Treatment Notes Wound #6 (Lower Leg) Wound Laterality: Left, Lateral Cleanser Wound Cleanser Discharge Instruction: Cleanse the wound with wound cleanser prior to applying a clean dressing using gauze sponges, not tissue or cotton balls. Peri-Wound Care Zinc Oxide Ointment 30g tube Discharge Instruction: Apply Zinc Oxide to periwound with each dressing change Sween Lotion (Moisturizing lotion) Discharge Instruction: Apply moisturizing lotion as directed Topical Primary Dressing KerraCel Ag Gelling Fiber Dressing, 4x5 in (silver alginate) Discharge Instruction: Apply silver alginate to wound bed as instructed Secondary Dressing ABD Pad, 5x9 Discharge Instruction: Apply over primary dressing as directed. Woven Gauze Sponge, Non-Sterile 4x4 in Discharge Instruction: Apply over primary dressing as directed. Secured With Compression Wrap ThreePress (3 layer compression wrap) Discharge Instruction: Apply  three layer compression as directed. Compression  Stockings Add-Ons Electronic Signature(s) Signed: 07/26/2021 4:40:18 PM By: [name] RN Entered By: [name] on 07/26/2021 15:26:34 -------------------------------------------------------------------------------- Vitals Details Patient Name: Date of Service: Catherine Powell. 07/26/2021 2:30 PM Medical Record Number: [id number] Patient Account Number: [id number] Date of Birth/Sex: Treating RN: [birth year] (56 y.o. Catherine Powell, Catherine Powell Primary Care Avalina Benko: [facility], [place] [facility] Other Clinician: Referring Catcher Dehoyos: Treating Analysia Dungee/Extender: Harland German LSH, TA [facility] Weeks in Treatment: 13 Vital Signs Time Taken: 15:07 Temperature (F): 98.6 Height (in): 68 Pulse (bpm): 74 Weight (lbs): 430 Respiratory Rate (breaths/min): 17 Body Mass Index (BMI): 65.4 Blood Pressure (mmHg): 131/78 Reference Range: 80 - 120 mg / dl Electronic Signature(s) Signed: 07/26/2021 4:40:18 PM By: [name] RN Entered By: [name] on 07/26/2021 15:09:02

## 2021-07-26 NOTE — Progress Notes (Signed)
BLESS, HANSELMAN ([id number]) Visit Report for 07/26/2021 Chief Complaint Document Details Patient Name: Date of Service: Catherine Powell 07/26/2021 2:30 PM Medical Record Number: [id number] Patient Account Number: [id number] Date of Birth/Sex: Treating RN: 04/06/1965 (56 y.o. Benjaman Lobe Primary Care Provider: [facility], [place] Other Clinician: Referring Provider: Treating Provider/Extender: Orlene Och [place], [place] Weeks in Treatment: 13 Information Obtained from: Patient Chief Complaint Right posterior leg wound Electronic Signature(s) Signed: 07/26/2021 4:35:19 PM By: Kalman Shan DO Entered By: Kalman Shan on 07/26/2021 16:06:46 -------------------------------------------------------------------------------- HPI Details Patient Name: Date of Service: Catherine Powell. 07/26/2021 2:30 PM Medical Record Number: [id number] Patient Account Number: [id number] Date of Birth/Sex: Treating RN: 1965/08/24 (56 y.o. Benjaman Lobe Primary Care Provider: [facility], [place] Other Clinician: Referring Provider: Treating Provider/Extender: Orlene Och L[facility], [place] Weeks in Treatment: 13 History of Present Illness HPI Description: Admission 04/20/2021 Ms. Catherine Powell is a 56 year old female with a past medical history of morbid obesity with BMI of 66 and wheelchair dependent due to this, lymphedema and hypertension that presents to the clinic for a 73-month history of nonhealing ulcer to the right calf. She states this started spontaneously and has been using Unna boots and Xeroform with benefit She saw vein and vascular who recommended bariatric surgery. She currently denies systemic signs of infection. 2/17; patient presents for follow-up. She tolerated the compression wrap well. She does report increased tenderness to the surrounding wound bed. 2/24; patient presents for follow-up. She reports taking Keflex with improvement in her symptoms. She has  developed some vaginal itching and reports a history of yeast infection after taking antibiotics. She has her juxta lite compression today. 3/27; patient presents for follow-up. Patient states that she has had 2 areas open up to the right lower extremity over the past week. She was started on Keflex by her primary care physician for potential cellulitis. She has been using her juxta light compression daily without issues. She also has a compression stocking to the left lower extremity that she has been using since discharge from clinic on 2/24. 4/4; patient presents for follow-up. She reports that the compression wrap placed in clinic last visit slid down the following day. She has been using silver alginate to the wound site and using her juxta lite compression daily. She has no issues or complaints today. She would like a note to be out of work so that she can elevate her legs to help heal the wounds. 4/11; the patient arrives today with the area on her right posterior calf totally closed. She has severe lymphedema but has juxta lite stockings and new compression pumps arrive this morning she has not yet used them. 4/26; patient presents with reopening of her previous wounds on the right posterior calf Over the past week. She states she has been using her juxta lite compressions and lymphedema pumps. She is not on a diuretic. 5/18; patient presents for follow-up. She continues to have a wound to the right posterior calf and has developed new wounds to the left lower extremity. She states these happened spontaneously over the past week. She has a juxta light compression however has not been wearing this to the left lower extremity. She has been wearing her juxta lite to the right lower extremity. She denies signs of infection. Electronic Signature(s) Signed: 07/26/2021 4:35:19 PM By: Kalman Shan DO Entered By: Kalman Shan on 07/26/2021  16:22:54 -------------------------------------------------------------------------------- Physical Exam Details Patient Name: Date of  Service: Catherine Powell 07/26/2021 2:30 PM Medical Record Number: [id number] Patient Account Number: [id number] Date of Birth/Sex: Treating RN: [birth year] (56 y.o. Benjaman Lobe Primary Care Provider: [facility], [place] Other Clinician: Referring Provider: Treating Provider/Extender: Orlene Och L[facility], [place] Weeks in Treatment: 13 Constitutional respirations regular, non-labored and within target range for patient.. Cardiovascular 2+ dorsalis pedis/posterior tibialis pulses. Psychiatric pleasant and cooperative. Notes Right lower extremity: T the to the posterior aspect, calf region there are 2 small open wounds with granulation tissue. No signs of infection. Poorly controlled o lymphedema. Left lower extremity: 2 open wounds with scant nonviable tissue and granulation tissue present. Electronic Signature(s) Signed: 07/26/2021 4:35:19 PM By: Kalman Shan DO Entered By: Kalman Shan on 07/26/2021 16:25:57 -------------------------------------------------------------------------------- Physician Orders Details Patient Name: Date of Service: Catherine Powell. 07/26/2021 2:30 PM Medical Record Number: [id number] Patient Account Number: [id number] Date of Birth/Sex: Treating RN: 05-Jan-1966 (56 y.o. Benjaman Lobe Primary Care Provider: [facility], [place] Other Clinician: Referring Provider: Treating Provider/Extender: Orlene Och L[facility], [place] Weeks in Treatment: 70 Verbal / Phone Orders: No Diagnosis Coding Follow-up Appointments ppointment in 1 week. - Dr. Heber [place] and [facility] Room # 8 Return A Bathing/ Shower/ Hygiene May shower with protection but do not get wound dressing(s) wet. Edema Control - Lymphedema / SCD / Other Lymphedema Pumps. Use Lymphedema pumps on leg(s) 2-3 times a day for 45-60 minutes. If  wearing any wraps or hose, do not remove them. Continue exercising as instructed. - wound center to order lymphedema pumps from [employer]- someone will call you once approved by insurance. Elevate legs to the level of the heart or above for 30 minutes daily and/or when sitting, a frequency of: - throughout the day. Avoid standing for long periods of time. Exercise regularly Moisturize legs daily. - apply left leg every night before bed. Compression stocking or Garment 20-30 mm/Hg pressure to: - Apply juxtalite HD in the morning and remove at night to left leg. Wound Treatment Wound #4 - Lower Leg Wound Laterality: Right, Posterior Cleanser: Wound Cleanser (Generic) Every Other Day/15 Days Discharge Instructions: Cleanse the wound with wound cleanser prior to applying a clean dressing using gauze sponges, not tissue or cotton balls. Peri-Wound Care: Zinc Oxide Ointment 30g tube Every Other Day/15 Days Discharge Instructions: Apply Zinc Oxide to periwound with each dressing change Peri-Wound Care: Sween Lotion (Moisturizing lotion) Every Other Day/15 Days Discharge Instructions: Apply moisturizing lotion as directed Prim Dressing: KerraCel Ag Gelling Fiber Dressing, 4x5 in (silver alginate) (Generic) Every Other Day/15 Days ary Discharge Instructions: Apply silver alginate to wound bed as instructed Secondary Dressing: ABD Pad, 5x9 Every Other Day/15 Days Discharge Instructions: Apply over primary dressing as directed. Secondary Dressing: Woven Gauze Sponge, Non-Sterile 4x4 in (Generic) Every Other Day/15 Days Discharge Instructions: Apply over primary dressing as directed. Compression Wrap: ThreePress (3 layer compression wrap) Every Other Day/15 Days Discharge Instructions: Apply three layer compression as directed. Compression Stockings: Circaid Juxta Lite Compression Wrap Right Leg Compression Amount: 30-40 mmHG Discharge Instructions: Apply Circaid Juxta Lite Compression Wrap  daily as instructed. Apply first thing in the morning, remove at night before bed. Wound #5 - Lower Leg Wound Laterality: Left, Posterior Cleanser: Wound Cleanser (Generic) Every Other Day/15 Days Discharge Instructions: Cleanse the wound with wound cleanser prior to applying a clean dressing using gauze sponges, not tissue or cotton balls. Peri-Wound Care: Zinc Oxide Ointment 30g tube Every Other Day/15 Days Discharge Instructions:  Apply Zinc Oxide to periwound with each dressing change Peri-Wound Care: Sween Lotion (Moisturizing lotion) Every Other Day/15 Days Discharge Instructions: Apply moisturizing lotion as directed Prim Dressing: KerraCel Ag Gelling Fiber Dressing, 4x5 in (silver alginate) (Generic) Every Other Day/15 Days ary Discharge Instructions: Apply silver alginate to wound bed as instructed Secondary Dressing: ABD Pad, 5x9 Every Other Day/15 Days Discharge Instructions: Apply over primary dressing as directed. Secondary Dressing: Woven Gauze Sponge, Non-Sterile 4x4 in (Generic) Every Other Day/15 Days Discharge Instructions: Apply over primary dressing as directed. Compression Wrap: ThreePress (3 layer compression wrap) Every Other Day/15 Days Discharge Instructions: Apply three layer compression as directed. Wound #6 - Lower Leg Wound Laterality: Left, Lateral Cleanser: Wound Cleanser (Generic) Every Other Day/15 Days Discharge Instructions: Cleanse the wound with wound cleanser prior to applying a clean dressing using gauze sponges, not tissue or cotton balls. Peri-Wound Care: Zinc Oxide Ointment 30g tube Every Other Day/15 Days Discharge Instructions: Apply Zinc Oxide to periwound with each dressing change Peri-Wound Care: Sween Lotion (Moisturizing lotion) Every Other Day/15 Days Discharge Instructions: Apply moisturizing lotion as directed Prim Dressing: KerraCel Ag Gelling Fiber Dressing, 4x5 in (silver alginate) (Generic) Every Other Day/15 Days ary Discharge  Instructions: Apply silver alginate to wound bed as instructed Secondary Dressing: ABD Pad, 5x9 Every Other Day/15 Days Discharge Instructions: Apply over primary dressing as directed. Secondary Dressing: Woven Gauze Sponge, Non-Sterile 4x4 in (Generic) Every Other Day/15 Days Discharge Instructions: Apply over primary dressing as directed. Compression Wrap: ThreePress (3 layer compression wrap) Every Other Day/15 Days Discharge Instructions: Apply three layer compression as directed. Electronic Signature(s) Signed: 07/26/2021 4:35:19 PM By: Kalman Shan DO Entered By: Kalman Shan on 07/26/2021 16:26:07 -------------------------------------------------------------------------------- Problem List Details Patient Name: Date of Service: Catherine Powell. 07/26/2021 2:30 PM Medical Record Number: [id number] Patient Account Number: [id number] Date of Birth/Sex: Treating RN: [birth year] (56 y.o. [name], [name] Primary Care Provider: [facility], [place] Other Clinician: Referring Provider: Treating Provider/Extender: Orlene Och [place], [place] Weeks in Treatment: 13 Active Problems ICD-10 Encounter Code Description Active Date MDM Diagnosis L97.812 Non-pressure chronic ulcer of other part of right lower leg with fat layer 04/20/2021 No Yes exposed L97.822 Non-pressure chronic ulcer of other part of left lower leg with fat layer exposed5/18/2023 No Yes I87.311 Chronic venous hypertension (idiopathic) with ulcer of right lower extremity 06/04/2021 No Yes I89.0 Lymphedema, not elsewhere classified 04/20/2021 No Yes Z99.3 Dependence on wheelchair 04/20/2021 No Yes E66.01 Morbid (severe) obesity due to excess calories 04/20/2021 No Yes Inactive Problems Resolved Problems ICD-10 Code Description Active Date Resolved Date B37.31 Acute candidiasis of vulva and vagina 05/04/2021 05/04/2021 Electronic Signature(s) Signed: 07/26/2021 4:35:19 PM By: Kalman Shan DO Entered By:  Kalman Shan on 07/26/2021 16:34:11 -------------------------------------------------------------------------------- Progress Note Details Patient Name: Date of Service: Catherine Powell. 07/26/2021 2:30 PM Medical Record Number: [id number] Patient Account Number: [id number] Date of Birth/Sex: Treating RN: 05-10-1965 (56 y.o. Benjaman Lobe Primary Care Provider: Other Clinician: [facility], [place] Referring Provider: Treating Provider/Extender: Orlene Och [place], [place] Weeks in Treatment: 13 Subjective Chief Complaint Information obtained from Patient Right posterior leg wound History of Present Illness (HPI) Admission 04/20/2021 Ms. Kikuko Tresner is a 56 year old female with a past medical history of morbid obesity with BMI of 66 and wheelchair dependent due to this, lymphedema and hypertension that presents to the clinic for a 58-month history of nonhealing ulcer to the right calf. She states this started spontaneously and has  been using Unna boots and Xeroform with benefit She saw vein and vascular who recommended bariatric surgery. She currently denies systemic signs of infection. 2/17; patient presents for follow-up. She tolerated the compression wrap well. She does report increased tenderness to the surrounding wound bed. 2/24; patient presents for follow-up. She reports taking Keflex with improvement in her symptoms. She has developed some vaginal itching and reports a history of yeast infection after taking antibiotics. She has her juxta lite compression today. 3/27; patient presents for follow-up. Patient states that she has had 2 areas open up to the right lower extremity over the past week. She was started on Keflex by her primary care physician for potential cellulitis. She has been using her juxta light compression daily without issues. She also has a compression stocking to the left lower extremity that she has been using since discharge from clinic on  2/24. 4/4; patient presents for follow-up. She reports that the compression wrap placed in clinic last visit slid down the following day. She has been using silver alginate to the wound site and using her juxta lite compression daily. She has no issues or complaints today. She would like a note to be out of work so that she can elevate her legs to help heal the wounds. 4/11; the patient arrives today with the area on her right posterior calf totally closed. She has severe lymphedema but has juxta lite stockings and new compression pumps arrive this morning she has not yet used them. 4/26; patient presents with reopening of her previous wounds on the right posterior calf Over the past week. She states she has been using her juxta lite compressions and lymphedema pumps. She is not on a diuretic. 5/18; patient presents for follow-up. She continues to have a wound to the right posterior calf and has developed new wounds to the left lower extremity. She states these happened spontaneously over the past week. She has a juxta light compression however has not been wearing this to the left lower extremity. She has been wearing her juxta lite to the right lower extremity. She denies signs of infection. Patient History Information obtained from Patient. Family History Unknown History. Social History Never smoker, Marital Status - Married, Alcohol Use - Never, Drug Use - No History, Caffeine Use - Daily - coffee. Medical History Hematologic/Lymphatic Patient has history of Lymphedema Respiratory Patient has history of Asthma Cardiovascular Patient has history of Hypertension, Peripheral Venous Disease Musculoskeletal Patient has history of Osteoarthritis Hospitalization/Surgery History - knee right arthoscopy. - appendectomy. - cardiac cath. - cervical fusion. - cholecystectomy. - wisdom tooth. Medical A Surgical History  Notes nd Hematologic/Lymphatic Hypercholesterolemia Gastrointestinal GERD Musculoskeletal Degenerative joint disease of knees Objective Constitutional respirations regular, non-labored and within target range for patient.. Vitals Time Taken: 3:07 PM, Height: 68 in, Weight: 430 lbs, BMI: 65.4, Temperature: 98.6 F, Pulse: 74 bpm, Respiratory Rate: 17 breaths/min, Blood Pressure: 131/78 mmHg. Cardiovascular 2+ dorsalis pedis/posterior tibialis pulses. Psychiatric pleasant and cooperative. General Notes: Right lower extremity: T the to the posterior aspect, calf region there are 2 small open wounds with granulation tissue. No signs of infection. o Poorly controlled lymphedema. Left lower extremity: 2 open wounds with scant nonviable tissue and granulation tissue present. Integumentary (Hair, Skin) Wound #4 status is Open. Original cause of wound was Gradually Appeared. The date acquired was: 07/03/2021. The wound has been in treatment 3 weeks. The wound is located on the Right,Posterior Lower Leg. The wound measures 2cm length x 5cm width x 0.2cm  depth; 7.854cm^2 area and 1.571cm^3 volume. There is Fat Layer (Subcutaneous Tissue) exposed. There is no tunneling or undermining noted. There is a medium amount of serosanguineous drainage noted. The wound margin is distinct with the outline attached to the wound base. There is medium (34-66%) red, pink granulation within the wound bed. There is a medium (34-66%) amount of necrotic tissue within the wound bed including Adherent Slough. Wound #5 status is Open. Original cause of wound was Gradually Appeared. The date acquired was: 07/26/2021. The wound is located on the Left,Posterior Lower Leg. The wound measures 3cm length x 3cm width x 0.1cm depth; 7.069cm^2 area and 0.707cm^3 volume. There is Fat Layer (Subcutaneous Tissue) exposed. There is no tunneling or undermining noted. There is a large amount of serosanguineous drainage noted. The wound  margin is distinct with the outline attached to the wound base. There is large (67-100%) red, pink granulation within the wound bed. There is a small (1-33%) amount of necrotic tissue within the wound bed including Adherent Slough. Wound #6 status is Open. Original cause of wound was Gradually Appeared. The date acquired was: 07/26/2021. The wound is located on the Left,Lateral Lower Leg. The wound measures 0.5cm length x 0.7cm width x 0.4cm depth; 0.275cm^2 area and 0.11cm^3 volume. There is no tunneling or undermining noted. There is a medium amount of serosanguineous drainage noted. The wound margin is distinct with the outline attached to the wound base. There is no granulation within the wound bed. There is a large (67-100%) amount of necrotic tissue within the wound bed including Adherent Slough. Assessment Active Problems ICD-10 Non-pressure chronic ulcer of other part of right lower leg with fat layer exposed Non-pressure chronic ulcer of other part of left lower leg with fat layer exposed Chronic venous hypertension (idiopathic) with ulcer of right lower extremity Chronic venous hypertension (idiopathic) with ulcer of right lower extremity Lymphedema, not elsewhere classified Dependence on wheelchair Morbid (severe) obesity due to excess calories Patient's right lower extremity wounds are stable. I recommended continuing silver alginate but will wrap her under 3 layer compression. Her swelling is poorly controlled with her juxta lite compression. She now has new wounds to the left lower extremity. I also recommended silver alginate and 3 layer compression here. 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 Patient has seen vein and vascular for her wounds and was recommended bariatric surgery. Unfortunately she does not want to proceed with this. Procedures Wound #4 Pre-procedure diagnosis of Wound #4 is a Venous Leg Ulcer located on the  Right,Posterior Lower Leg . There was a Three Layer Compression Therapy Procedure by Rhae Hammock, RN. Post procedure Diagnosis Wound #4: Same as Pre-Procedure Wound #5 Pre-procedure diagnosis of Wound #5 is a Lymphedema located on the Left,Posterior Lower Leg . There was a Three Layer Compression Therapy Procedure by Rhae Hammock, RN. Post procedure Diagnosis Wound #5: Same as Pre-Procedure Wound #6 Pre-procedure diagnosis of Wound #6 is a Lymphedema located on the Left,Lateral Lower Leg . There was a Three Layer Compression Therapy Procedure by Rhae Hammock, RN. Post procedure Diagnosis Wound #6: Same as Pre-Procedure Plan Follow-up Appointments: Return Appointment in 1 week. - Dr. Heber [place] and [facility] Room # 8 Bathing/ Shower/ Hygiene: May shower with protection but do not get wound dressing(s) wet. Edema Control - Lymphedema / SCD / Other: Lymphedema Pumps. Use Lymphedema pumps on leg(s) 2-3 times a day for 45-60 minutes. If wearing any wraps or hose, do not remove them. Continue exercising  as instructed. - wound center to order lymphedema pumps from [employer]- someone will call you once approved by insurance. Elevate legs to the level of the heart or above for 30 minutes daily and/or when sitting, a frequency of: - throughout the day. Avoid standing for long periods of time. Exercise regularly Moisturize legs daily. - apply left leg every night before bed. Compression stocking or Garment 20-30 mm/Hg pressure to: - Apply juxtalite HD in the morning and remove at night to left leg. WOUND #4: - Lower Leg Wound Laterality: Right, Posterior Cleanser: Wound Cleanser (Generic) Every Other Day/15 Days Discharge Instructions: Cleanse the wound with wound cleanser prior to applying a clean dressing using gauze sponges, not tissue or cotton balls. Peri-Wound Care: Zinc Oxide Ointment 30g tube Every Other Day/15 Days Discharge Instructions: Apply Zinc Oxide to periwound with  each dressing change Peri-Wound Care: Sween Lotion (Moisturizing lotion) Every Other Day/15 Days Discharge Instructions: Apply moisturizing lotion as directed Prim Dressing: KerraCel Ag Gelling Fiber Dressing, 4x5 in (silver alginate) (Generic) Every Other Day/15 Days ary Discharge Instructions: Apply silver alginate to wound bed as instructed Secondary Dressing: ABD Pad, 5x9 Every Other Day/15 Days Discharge Instructions: Apply over primary dressing as directed. Secondary Dressing: Woven Gauze Sponge, Non-Sterile 4x4 in (Generic) Every Other Day/15 Days Discharge Instructions: Apply over primary dressing as directed. Com pression Wrap: ThreePress (3 layer compression wrap) Every Other Day/15 Days Discharge Instructions: Apply three layer compression as directed. Com pression Stockings: Circaid Juxta Lite Compression Wrap Compression Amount: 30-40 mmHg (right) Discharge Instructions: Apply Circaid Juxta Lite Compression Wrap daily as instructed. Apply first thing in the morning, remove at night before bed. WOUND #5: - Lower Leg Wound Laterality: Left, Posterior Cleanser: Wound Cleanser (Generic) Every Other Day/15 Days Discharge Instructions: Cleanse the wound with wound cleanser prior to applying a clean dressing using gauze sponges, not tissue or cotton balls. Peri-Wound Care: Zinc Oxide Ointment 30g tube Every Other Day/15 Days Discharge Instructions: Apply Zinc Oxide to periwound with each dressing change Peri-Wound Care: Sween Lotion (Moisturizing lotion) Every Other Day/15 Days Discharge Instructions: Apply moisturizing lotion as directed Prim Dressing: KerraCel Ag Gelling Fiber Dressing, 4x5 in (silver alginate) (Generic) Every Other Day/15 Days ary Discharge Instructions: Apply silver alginate to wound bed as instructed Secondary Dressing: ABD Pad, 5x9 Every Other Day/15 Days Discharge Instructions: Apply over primary dressing as directed. Secondary Dressing: Woven Gauze Sponge,  Non-Sterile 4x4 in (Generic) Every Other Day/15 Days Discharge Instructions: Apply over primary dressing as directed. Com pression Wrap: ThreePress (3 layer compression wrap) Every Other Day/15 Days Discharge Instructions: Apply three layer compression as directed. WOUND #6: - Lower Leg Wound Laterality: Left, Lateral Cleanser: Wound Cleanser (Generic) Every Other Day/15 Days Discharge Instructions: Cleanse the wound with wound cleanser prior to applying a clean dressing using gauze sponges, not tissue or cotton balls. Peri-Wound Care: Zinc Oxide Ointment 30g tube Every Other Day/15 Days Discharge Instructions: Apply Zinc Oxide to periwound with each dressing change Peri-Wound Care: Sween Lotion (Moisturizing lotion) Every Other Day/15 Days Discharge Instructions: Apply moisturizing lotion as directed Prim Dressing: KerraCel Ag Gelling Fiber Dressing, 4x5 in (silver alginate) (Generic) Every Other Day/15 Days ary Discharge Instructions: Apply silver alginate to wound bed as instructed Secondary Dressing: ABD Pad, 5x9 Every Other Day/15 Days Discharge Instructions: Apply over primary dressing as directed. Secondary Dressing: Woven Gauze Sponge, Non-Sterile 4x4 in (Generic) Every Other Day/15 Days Discharge Instructions: Apply over primary dressing as directed. Com pression Wrap: ThreePress (3 layer compression wrap)  Every Other Day/15 Days Discharge Instructions: Apply three layer compression as directed. 1. Silver alginate under 3 layer compression to lower extremities bilaterally 2. Follow-up in 1 week Electronic Signature(s) Signed: 07/26/2021 4:35:19 PM By: Kalman Shan DO Entered By: Kalman Shan on 07/26/2021 16:30:50 -------------------------------------------------------------------------------- HxROS Details Patient Name: Date of Service: Catherine Powell. 07/26/2021 2:30 PM Medical Record Number: [id number] Patient Account Number: [id number] Date of Birth/Sex: Treating  RN: [birth year] (56 y.o. Benjaman Lobe Primary Care Provider: [facility], [place] Other Clinician: Referring Provider: Treating Provider/Extender: Orlene Och [place], TA [place] Weeks in Treatment: 13 Information Obtained From Patient Hematologic/Lymphatic Medical History: Positive for: Lymphedema Past Medical History Notes: Hypercholesterolemia Respiratory Medical History: Positive for: Asthma Cardiovascular Medical History: Positive for: Hypertension; Peripheral Venous Disease Gastrointestinal Medical History: Past Medical History Notes: GERD Musculoskeletal Medical History: Positive for: Osteoarthritis Past Medical History Notes: Degenerative joint disease of knees Immunizations Pneumococcal Vaccine: Received Pneumococcal Vaccination: No Implantable Devices No devices added Hospitalization / Surgery History Type of Hospitalization/Surgery knee right arthoscopy appendectomy cardiac cath cervical fusion cholecystectomy wisdom tooth Family and Social History Unknown History: Yes; Never smoker; Marital Status - Married; Alcohol Use: Never; Drug Use: No History; Caffeine Use: Daily - coffee; Financial Concerns: No; Food, Clothing or Shelter Needs: No; Support System Lacking: No; Transportation Concerns: No Electronic Signature(s) Signed: 07/26/2021 4:35:19 PM By: Kalman Shan DO Signed: 07/26/2021 4:40:18 PM By: Rhae Hammock RN Entered By: Kalman Shan on 07/26/2021 16:23:23 -------------------------------------------------------------------------------- SuperBill Details Patient Name: Date of Service: Catherine Powell 07/26/2021 Medical Record Number: [id number] Patient Account Number: [id number] Date of Birth/Sex: Treating RN: 06/29/1965 (56 y.o. 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 Modifier: Quantity: 1 Physician Procedures : CPT4 Code Description Modifier [id number] - WC PHYS LEVEL 3 - EST PT ICD-10 Diagnosis Description [id number] Non-pressure chronic ulcer of other part of right lower leg with fat layer exposed L97.822 Non-pressure chronic ulcer of other part of left  lower leg with fat layer exposed I87.311 Chronic venous hypertension (idiopathic) with ulcer of right lower extremity I87.312 Chronic venous hypertension (idiopathic) with ulcer of left lower extremity Quantity: 1 Electronic Signature(s) Signed: 07/26/2021 4:35:19 PM By: Kalman Shan DO Entered By: Kalman Shan on 07/26/2021 16:35:04

## 2021-08-02 ENCOUNTER — Other Ambulatory Visit: Payer: Self-pay | Admitting: Family Medicine

## 2021-08-02 ENCOUNTER — Encounter (HOSPITAL_BASED_OUTPATIENT_CLINIC_OR_DEPARTMENT_OTHER): Payer: BC Managed Care – PPO | Admitting: Internal Medicine

## 2021-08-02 DIAGNOSIS — L97822 Non-pressure chronic ulcer of other part of left lower leg with fat layer exposed: Secondary | ICD-10-CM

## 2021-08-02 DIAGNOSIS — Z6841 Body Mass Index (BMI) 40.0 and over, adult: Secondary | ICD-10-CM | POA: Diagnosis not present

## 2021-08-02 DIAGNOSIS — I87313 Chronic venous hypertension (idiopathic) with ulcer of bilateral lower extremity: Secondary | ICD-10-CM

## 2021-08-02 DIAGNOSIS — L97812 Non-pressure chronic ulcer of other part of right lower leg with fat layer exposed: Secondary | ICD-10-CM | POA: Diagnosis not present

## 2021-08-02 DIAGNOSIS — Z993 Dependence on wheelchair: Secondary | ICD-10-CM | POA: Diagnosis not present

## 2021-08-02 DIAGNOSIS — I890 Lymphedema, not elsewhere classified: Secondary | ICD-10-CM | POA: Diagnosis not present

## 2021-08-02 DIAGNOSIS — I10 Essential (primary) hypertension: Secondary | ICD-10-CM | POA: Diagnosis not present

## 2021-08-02 NOTE — Progress Notes (Addendum)
Catherine Powell ([id number]) Visit Report for 08/02/2021 Chief Complaint Document Details Patient Name: Date of Service: Catherine Powell 08/02/2021 12:45 PM Medical Record Number: [id number] Patient Account Number: [id number] Date of Birth/Sex: Treating RN: 04-Oct-1965 (56 y.o. [name] Primary Care Provider: [facility], [place] Other Clinician: Referring Provider: Treating Provider/Extender: Harland German LSH, TA [place] Weeks in Treatment: 14 Information Obtained from: Patient Chief Complaint Right posterior leg wound Electronic Signature(s) Signed: 08/02/2021 1:50:47 PM By: Geralyn Corwin DO Entered By: Geralyn Corwin on 08/02/2021 13:38:28 -------------------------------------------------------------------------------- HPI Details Patient Name: Date of Service: Catherine Powell 08/02/2021 12:45 PM Medical Record Number: [id number] Patient Account Number: [id number] Date of Birth/Sex: Treating RN: 08/26/1965 (56 y.o. [name] Primary Care Provider: [facility], [place] Other Clinician: Referring Provider: Treating Provider/Extender: Harland German LSH, TA [place] Weeks in Treatment: 14 History of Present Illness HPI Description: Admission 04/20/2021 Catherine Powell is a 56 year old female with a past medical history of morbid obesity with BMI of 66 and wheelchair dependent due to this, lymphedema and hypertension that presents to the clinic for a 70-month history of nonhealing ulcer to the right calf. She states this started spontaneously and has been using Unna boots and Xeroform with benefit She saw vein and vascular who recommended bariatric surgery. She currently denies systemic signs of infection. 2/17; patient presents for follow-up. She tolerated the compression wrap well. She does report increased tenderness to the surrounding wound bed. 2/24; patient presents for follow-up. She reports taking Keflex with improvement in her symptoms. She has  developed some vaginal itching and reports a history of yeast infection after taking antibiotics. She has her juxta lite compression today. 3/27; patient presents for follow-up. Patient states that she has had 2 areas open up to the right lower extremity over the past week. She was started on Keflex by her primary care physician for potential cellulitis. She has been using her juxta light compression daily without issues. She also has a compression stocking to the left lower extremity that she has been using since discharge from clinic on 2/24. 4/4; patient presents for follow-up. She reports that the compression wrap placed in clinic last visit slid down the following day. She has been using silver alginate to the wound site and using her juxta lite compression daily. She has no issues or complaints today. She would like a note to be out of work so that she can elevate her legs to help heal the wounds. 4/11; the patient arrives today with the area on her right posterior calf totally closed. She has severe lymphedema but has juxta lite stockings and new compression pumps arrive this morning she has not yet used them. 4/26; patient presents with reopening of her previous wounds on the right posterior calf Over the past week. She states she has been using her juxta lite compressions and lymphedema pumps. She is not on a diuretic. 5/18; patient presents for follow-up. She continues to have a wound to the right posterior calf and has developed new wounds to the left lower extremity. She states these happened spontaneously over the past week. She has a juxta light compression however has not been wearing this to the left lower extremity. She has been wearing her juxta lite to the right lower extremity. She denies signs of infection. 5/25; patient presents for follow-up. The compression wrap slid off 1 to 2 days after it was placed in office last week. She has been using  silver alginate daily to the wound  beds. She has also been wearing her juxta light compression. She currently denies signs of infection. Electronic Signature(s) Signed: 08/02/2021 1:50:47 PM By: Geralyn CorwinHoffman, Cam Dauphin DO Entered By: Geralyn CorwinHoffman, Nicholai Willette on 08/02/2021 13:39:28 -------------------------------------------------------------------------------- Physical Exam Details Patient Name: Date of Service: Catherine FlakesBRIGMA N, [name] 08/02/2021 12:45 PM Medical Record Number: [id number] Patient Account Number: [id number] Date of Birth/Sex: Treating RN: 05/28/1965 (56 y.o. Arta SilenceF) [name], [name] [facility] [place] Other Clinician: Referring Provider: Treating Provider/Extender: Harland GermanHoffman, Ryonna Cimini [place] LSH, TA [place] Weeks in Treatment: 14 Constitutional respirations regular, non-labored and within target range for patient.. Cardiovascular 2+ dorsalis pedis/posterior tibialis pulses. Psychiatric pleasant and cooperative. Notes Right lower extremity: T the to the posterior aspect, calf region there are 2 small open wounds with granulation tissue and tightly adhered non viable tissue. No o signs of infection. Poorly controlled lymphedema. Left lower extremity: 2 open wounds, more proximal has non viable tissue and the more distal wound is mostly epithelialized. Electronic Signature(s) Signed: 08/02/2021 1:50:47 PM By: Geralyn CorwinHoffman, Lajoya Dombek DO Entered By: Geralyn CorwinHoffman, Yarelly Kuba on 08/02/2021 13:45:48 -------------------------------------------------------------------------------- Physician Orders Details Patient Name: Date of Service: Catherine FlakesBRIGMA N, [name] 08/02/2021 12:45 PM Medical Record Number: [id number] Patient Account Number: [id number] Date of Birth/Sex: Treating RN: 03/18/1965 (56 y.o. Arta SilenceF) [name], [name] [facility] [place] Other Clinician: Referring Provider: Treating Provider/Extender: Harland GermanHoffman, Riyansh Gerstner [place] LSH, TA [place] Weeks in Treatment: 2514 Verbal / Phone Orders: No Diagnosis Coding ICD-10 Coding Code  Description [phone] Non-pressure chronic ulcer of other part of right lower leg with fat layer exposed L97.822 Non-pressure chronic ulcer of other part of left lower leg with fat layer exposed I87.313 Chronic venous hypertension (idiopathic) with ulcer of bilateral lower extremity I89.0 Lymphedema, not elsewhere classified Z99.3 Dependence on wheelchair E66.01 Morbid (severe) obesity due to excess calories Follow-up Appointments ppointment in 2 weeks. - (Dr. Leanord Hawkingobson covering) Dr. Mikey BussingHoffman and LennonBobbi, Room 8 08/16/2021 1245pm Return A Other: - Call [facility] to schedule an appointment for compression stockings at [phone]. [address] [facility]/ [facility]/ Hygiene May shower and wash wound with soap and water. Edema Control - Lymphedema / SCD / Other Lymphedema Pumps. Use Lymphedema pumps on leg(s) 2-3 times a day for 45-60 minutes. If wearing any wraps or hose, do not remove them. Continue exercising as instructed. - wound center to order lymphedema pumps from [facility]- someone will call you once approved by insurance. Elevate legs to the level of the heart or above for 30 minutes daily and/or when sitting, a frequency of: - throughout the day. Avoid standing for long periods of time. Exercise regularly Moisturize legs daily. - apply left leg every night before bed. Compression stocking or Garment 20-30 mm/Hg pressure to: - Apply juxtalite HD in the morning and remove at night to both legs. Wound Treatment Wound #4 - Lower Leg Wound Laterality: Right, Posterior Cleanser: Wound Cleanser (Generic) Every Other Day/15 Days Discharge Instructions: Cleanse the wound with wound cleanser prior to applying a clean dressing using gauze sponges, not tissue or cotton balls. Peri-Wound Care: Zinc Oxide Ointment 30g tube Every Other Day/15 Days Discharge Instructions: Apply Zinc Oxide to periwound with each dressing change Peri-Wound Care: Sween Lotion (Moisturizing lotion) Every  Other Day/15 Days Discharge Instructions: Apply moisturizing lotion as directed Prim Dressing: IODOFLEX 0.9% Cadexomer Iodine Pad 4x6 cm Every Other Day/15 Days ary Discharge Instructions: Apply to wound bed as instructed Secondary Dressing: ABD Pad, 5x9 Every  Other Day/15 Days Discharge Instructions: Apply over primary dressing as directed. Secondary Dressing: Woven Gauze Sponge, Non-Sterile 4x4 in (Generic) Every Other Day/15 Days Discharge Instructions: Apply over primary dressing as directed. Compression Stockings: Circaid Juxta Lite Compression Wrap Right Leg Compression Amount: 30-40 mmHG Discharge Instructions: Apply Circaid Juxta Lite Compression Wrap daily as instructed. Apply first thing in the morning, remove at night before bed. Wound #5 - Lower Leg Wound Laterality: Left, Posterior Cleanser: Wound Cleanser (Generic) Every Other Day/15 Days Discharge Instructions: Cleanse the wound with wound cleanser prior to applying a clean dressing using gauze sponges, not tissue or cotton balls. Peri-Wound Care: Zinc Oxide Ointment 30g tube Every Other Day/15 Days Discharge Instructions: Apply Zinc Oxide to periwound with each dressing change Peri-Wound Care: Sween Lotion (Moisturizing lotion) Every Other Day/15 Days Discharge Instructions: Apply moisturizing lotion as directed Prim Dressing: IODOFLEX 0.9% Cadexomer Iodine Pad 4x6 cm Every Other Day/15 Days ary Discharge Instructions: Apply to wound bed as instructed Secondary Dressing: ABD Pad, 5x9 Every Other Day/15 Days Discharge Instructions: Apply over primary dressing as directed. Secondary Dressing: Woven Gauze Sponge, Non-Sterile 4x4 in (Generic) Every Other Day/15 Days Discharge Instructions: Apply over primary dressing as directed. Wound #6 - Lower Leg Wound Laterality: Left, Lateral Cleanser: Wound Cleanser (Generic) Every Other Day/15 Days Discharge Instructions: Cleanse the wound with wound cleanser prior to applying a clean  dressing using gauze sponges, not tissue or cotton balls. Peri-Wound Care: Zinc Oxide Ointment 30g tube Every Other Day/15 Days Discharge Instructions: Apply Zinc Oxide to periwound with each dressing change Peri-Wound Care: Sween Lotion (Moisturizing lotion) Every Other Day/15 Days Discharge Instructions: Apply moisturizing lotion as directed Prim Dressing: IODOFLEX 0.9% Cadexomer Iodine Pad 4x6 cm Every Other Day/15 Days ary Discharge Instructions: Apply to wound bed as instructed Secondary Dressing: ABD Pad, 5x9 Every Other Day/15 Days Discharge Instructions: Apply over primary dressing as directed. Secondary Dressing: Woven Gauze Sponge, Non-Sterile 4x4 in (Generic) Every Other Day/15 Days Discharge Instructions: Apply over primary dressing as directed. [facility] referral to be fitted for 30-54mmHg compression stockings. ICD 10 code I89.0 - (ICD10 I87.313 - Chronic venous hypertension (idiopathic) with ulcer of bilateral lower extremity) Electronic Signature(s) Signed: 08/02/2021 1:50:47 PM By: Geralyn Corwin DO Entered By: Geralyn Corwin on 08/02/2021 13:45:59 Prescription 08/02/2021 -------------------------------------------------------------------------------- [place] H. Geralyn Corwin DO Patient Name: Provider: 1965/06/20 [id number] Date of Birth: NPI#: [id number] Sex: DEA #: [phone] Phone #: License #: Eligha Bridegroom [facility] Patient Address: [address] [place], [place] [address] [address] [place] [address] [phone] Allergies tolmetin sodium Provider's Orders [facility] - ICD10: I87.313 - [facility] referral to be fitted for 30-46mmHg compression stockings. ICD 10 code I89.0 Hand Signature: Date(s): Electronic Signature(s) Signed: 08/02/2021 1:50:47 PM By: Geralyn Corwin DO Entered By: Geralyn Corwin on 08/02/2021  13:45:59 -------------------------------------------------------------------------------- Problem List Details Patient Name: Date of Service: Catherine Powell 08/02/2021 12:45 PM Medical Record Number: [id number] Patient Account Number: [id number] Date of Birth/Sex: Treating RN: 11/16/1965 (56 y.o. [name] Primary Care Provider: [facility], [place] [facility] Other Clinician: Referring Provider: Treating Provider/Extender: Harland German LSH, TA [place] Weeks in Treatment: 14 Active Problems ICD-10 Encounter Code Description Active Date MDM Diagnosis L97.812 Non-pressure chronic ulcer of other part of right lower leg with fat layer 04/20/2021 No Yes exposed L97.822 Non-pressure chronic ulcer of other part of left lower leg with fat  layer exposed5/18/2023 No Yes I87.313 Chronic venous hypertension (idiopathic) with ulcer of bilateral lower extremity 08/02/2021 No Yes I89.0 Lymphedema, not elsewhere classified 04/20/2021 No Yes Z99.3 Dependence on wheelchair 04/20/2021 No Yes E66.01 Morbid (severe) obesity due to excess calories 04/20/2021 No Yes Inactive Problems Resolved Problems ICD-10 Code Description Active Date Resolved Date B37.31 Acute candidiasis of vulva and vagina 05/04/2021 05/04/2021 Electronic Signature(s) Signed: 08/02/2021 1:50:47 PM By: Geralyn Corwin DO Entered By: Geralyn Corwin on 08/02/2021 13:38:05 -------------------------------------------------------------------------------- Progress Note Details Patient Name: Date of Service: Catherine Powell. 08/02/2021 12:45 PM Medical Record Number: [id number] Patient Account Number: [id number] Date of Birth/Sex: Treating RN: Oct 03, 1965 (56 y.o. [name] Primary Care Provider: [facility], [place] Other Clinician: Referring Provider: Treating Provider/Extender: Harland German LSH, TA [place] Weeks in Treatment: 14 Subjective Chief Complaint Information obtained from Patient Right posterior leg wound History  of Present Illness (HPI) Admission 04/20/2021 Ms. Melody Cirrincione is a 56 year old female with a past medical history of morbid obesity with BMI of 66 and wheelchair dependent due to this, lymphedema and hypertension that presents to the clinic for a 61-month history of nonhealing ulcer to the right calf. She states this started spontaneously and has been using Unna boots and Xeroform with benefit She saw vein and vascular who recommended bariatric surgery. She currently denies systemic signs of infection. 2/17; patient presents for follow-up. She tolerated the compression wrap well. She does report increased tenderness to the surrounding wound bed. 2/24; patient presents for follow-up. She reports taking Keflex with improvement in her symptoms. She has developed some vaginal itching and reports a history of yeast infection after taking antibiotics. She has her juxta lite compression today. 3/27; patient presents for follow-up. Patient states that she has had 2 areas open up to the right lower extremity over the past week. She was started on Keflex by her primary care physician for potential cellulitis. She has been using her juxta light compression daily without issues. She also has a compression stocking to the left lower extremity that she has been using since discharge from clinic on 2/24. 4/4; patient presents for follow-up. She reports that the compression wrap placed in clinic last visit slid down the following day. She has been using silver alginate to the wound site and using her juxta lite compression daily. She has no issues or complaints today. She would like a note to be out of work so that she can elevate her legs to help heal the wounds. 4/11; the patient arrives today with the area on her right posterior calf totally closed. She has severe lymphedema but has juxta lite stockings and new compression pumps arrive this morning she has not yet used them. 4/26; patient presents with  reopening of her previous wounds on the right posterior calf Over the past week. She states she has been using her juxta lite compressions and lymphedema pumps. She is not on a diuretic. 5/18; patient presents for follow-up. She continues to have a wound to the right posterior calf and has developed new wounds to the left lower extremity. She states these happened spontaneously over the past week. She has a juxta light compression however has not been wearing this to the left lower extremity. She has been wearing her juxta lite to the right lower extremity. She denies signs of infection. 5/25; patient presents for follow-up. The compression wrap slid off 1 to 2 days after it was placed in office last week. She has been using silver  alginate daily to the wound beds. She has also been wearing her juxta light compression. She currently denies signs of infection. Patient History Information obtained from Patient. Family History Unknown History. Social History Never smoker, Marital Status - Married, Alcohol Use - Never, Drug Use - No History, Caffeine Use - Daily - coffee. Medical History Hematologic/Lymphatic Patient has history of Lymphedema Respiratory Patient has history of Asthma Cardiovascular Patient has history of Hypertension, Peripheral Venous Disease Musculoskeletal Patient has history of Osteoarthritis Hospitalization/Surgery History - knee right arthoscopy. - appendectomy. - cardiac cath. - cervical fusion. - cholecystectomy. - wisdom tooth. Medical A Surgical History Notes nd Hematologic/Lymphatic Hypercholesterolemia Gastrointestinal GERD Musculoskeletal Degenerative joint disease of knees Objective Constitutional respirations regular, non-labored and within target range for patient.. Vitals Time Taken: 12:50 PM, Height: 68 in, Weight: 430 lbs, BMI: 65.4, Temperature: 98.2 F, Pulse: 90 bpm, Respiratory Rate: 22 breaths/min, Blood Pressure: 138/81  mmHg. Cardiovascular 2+ dorsalis pedis/posterior tibialis pulses. Psychiatric pleasant and cooperative. General Notes: Right lower extremity: T the to the posterior aspect, calf region there are 2 small open wounds with granulation tissue and tightly adhered non o viable tissue. No signs of infection. Poorly controlled lymphedema. Left lower extremity: 2 open wounds, more proximal has non viable tissue and the more distal wound is mostly epithelialized. Integumentary (Hair, Skin) Wound #4 status is Open. Original cause of wound was Gradually Appeared. The date acquired was: 07/03/2021. The wound has been in treatment 4 weeks. The wound is located on the Right,Posterior Lower Leg. The wound measures 1cm length x 4.7cm width x 0.2cm depth; 3.691cm^2 area and 0.738cm^3 volume. There is Fat Layer (Subcutaneous Tissue) exposed. There is no tunneling or undermining noted. There is a medium amount of serosanguineous drainage noted. The wound margin is distinct with the outline attached to the wound base. There is medium (34-66%) red, pink granulation within the wound bed. There is a medium (34-66%) amount of necrotic tissue within the wound bed including Adherent Slough. Wound #5 status is Open. Original cause of wound was Gradually Appeared. The date acquired was: 07/26/2021. The wound has been in treatment 1 weeks. The wound is located on the Left,Posterior Lower Leg. The wound measures 0.5cm length x 0.5cm width x 0.1cm depth; 0.196cm^2 area and 0.02cm^3 volume. There is Fat Layer (Subcutaneous Tissue) exposed. There is no tunneling or undermining noted. There is a medium amount of serosanguineous drainage noted. The wound margin is distinct with the outline attached to the wound base. There is large (67-100%) red, pink granulation within the wound bed. There is no necrotic tissue within the wound bed. Wound #6 status is Open. Original cause of wound was Gradually Appeared. The date acquired was:  07/26/2021. The wound has been in treatment 1 weeks. The wound is located on the Left,Lateral Lower Leg. The wound measures 0.5cm length x 0.9cm width x 0.2cm depth; 0.353cm^2 area and 0.071cm^3 volume. There is no tunneling or undermining noted. There is a medium amount of serosanguineous drainage noted. The wound margin is distinct with the outline attached to the wound base. There is no granulation within the wound bed. There is a large (67-100%) amount of necrotic tissue within the wound bed including Adherent Slough. Assessment Active Problems ICD-10 Non-pressure chronic ulcer of other part of right lower leg with fat layer exposed Non-pressure chronic ulcer of other part of left lower leg with fat layer exposed Chronic venous hypertension (idiopathic) with ulcer of bilateral lower extremity Lymphedema, not elsewhere classified Dependence on wheelchair Morbid (severe)  obesity due to excess calories Patient's wounds are stable. There is more nonviable tissue present. Unfortunately she cannot keep the wrap on. She uses juxta light compression however I do not think this is offering enough benefit. I recommended custom made compression garments and we gave her a prescription and the number to call to have these done. For now I recommended switching the dressing to Iodoflex to help with further debridement. She can changes every other day. Follow-up in 2 weeks. Plan Follow-up Appointments: Return Appointment in 2 weeks. - (Dr. Leanord Hawking covering) Dr. Mikey Bussing and Goldonna, Room 8 08/16/2021 1245pm Other: - Call [facility] to schedule an appointment for compression stockings at [phone]. [address] [place] Bathing/ Shower/ Hygiene: May shower and wash wound with soap and water. Edema Control - Lymphedema / SCD / Other: Lymphedema Pumps. Use Lymphedema pumps on leg(s) 2-3 times a day for 45-60 minutes. If wearing any wraps or hose, do not remove them. Continue exercising as instructed.  - wound center to order lymphedema pumps from [facility]- someone will call you once approved by insurance. Elevate legs to the level of the heart or above for 30 minutes daily and/or when sitting, a frequency of: - throughout the day. Avoid standing for long periods of time. Exercise regularly Moisturize legs daily. - apply left leg every night before bed. Compression stocking or Garment 20-30 mm/Hg pressure to: - Apply juxtalite HD in the morning and remove at night to both legs. ordered were: [facility] referral to be fitted for 30-20mmHg compression stockings. ICD 10 code I89.0 WOUND #4: - Lower Leg Wound Laterality: Right, Posterior Cleanser: Wound Cleanser (Generic) Every Other Day/15 Days Discharge Instructions: Cleanse the wound with wound cleanser prior to applying a clean dressing using gauze sponges, not tissue or cotton balls. Peri-Wound Care: Zinc Oxide Ointment 30g tube Every Other Day/15 Days Discharge Instructions: Apply Zinc Oxide to periwound with each dressing change Peri-Wound Care: Sween Lotion (Moisturizing lotion) Every Other Day/15 Days Discharge Instructions: Apply moisturizing lotion as directed Prim Dressing: IODOFLEX 0.9% Cadexomer Iodine Pad 4x6 cm Every Other Day/15 Days ary Discharge Instructions: Apply to wound bed as instructed Secondary Dressing: ABD Pad, 5x9 Every Other Day/15 Days Discharge Instructions: Apply over primary dressing as directed. Secondary Dressing: Woven Gauze Sponge, Non-Sterile 4x4 in (Generic) Every Other Day/15 Days Discharge Instructions: Apply over primary dressing as directed. Com pression Stockings: Circaid Juxta Lite Compression Wrap Compression Amount: 30-40 mmHg (right) Discharge Instructions: Apply Circaid Juxta Lite Compression Wrap daily as instructed. Apply first thing in the morning, remove at night before bed. WOUND #5: - Lower Leg Wound Laterality: Left, Posterior Cleanser: Wound Cleanser  (Generic) Every Other Day/15 Days Discharge Instructions: Cleanse the wound with wound cleanser prior to applying a clean dressing using gauze sponges, not tissue or cotton balls. Peri-Wound Care: Zinc Oxide Ointment 30g tube Every Other Day/15 Days Discharge Instructions: Apply Zinc Oxide to periwound with each dressing change Peri-Wound Care: Sween Lotion (Moisturizing lotion) Every Other Day/15 Days Discharge Instructions: Apply moisturizing lotion as directed Prim Dressing: IODOFLEX 0.9% Cadexomer Iodine Pad 4x6 cm Every Other Day/15 Days ary Discharge Instructions: Apply to wound bed as instructed Secondary Dressing: ABD Pad, 5x9 Every Other Day/15 Days Discharge Instructions: Apply over primary dressing as directed. Secondary Dressing: Woven Gauze Sponge, Non-Sterile 4x4 in (Generic) Every Other Day/15 Days Discharge Instructions: Apply over primary dressing as directed. WOUND #6: - Lower Leg Wound Laterality: Left, Lateral Cleanser: Wound Cleanser (Generic)  Every Other Day/15 Days Discharge Instructions: Cleanse the wound with wound cleanser prior to applying a clean dressing using gauze sponges, not tissue or cotton balls. Peri-Wound Care: Zinc Oxide Ointment 30g tube Every Other Day/15 Days Discharge Instructions: Apply Zinc Oxide to periwound with each dressing change Peri-Wound Care: Sween Lotion (Moisturizing lotion) Every Other Day/15 Days Discharge Instructions: Apply moisturizing lotion as directed Prim Dressing: IODOFLEX 0.9% Cadexomer Iodine Pad 4x6 cm Every Other Day/15 Days ary Discharge Instructions: Apply to wound bed as instructed Secondary Dressing: ABD Pad, 5x9 Every Other Day/15 Days Discharge Instructions: Apply over primary dressing as directed. Secondary Dressing: Woven Gauze Sponge, Non-Sterile 4x4 in (Generic) Every Other Day/15 Days Discharge Instructions: Apply over primary dressing as directed. 1. Iodoflex 2. Juxta light compression 3. Information given  to order custom compression garments 4. Follow-up in 2 weeks Electronic Signature(s) Signed: 08/02/2021 1:50:47 PM By: Geralyn Corwin DO Entered By: Geralyn Corwin on 08/02/2021 13:49:58 -------------------------------------------------------------------------------- HxROS Details Patient Name: Date of Service: Catherine Powell 08/02/2021 12:45 PM Medical Record Number: [id number] Patient Account Number: [id number] Date of Birth/Sex: Treating RN: Jan 28, 1966 (56 y.o. [name] Primary Care Provider: [facility], [place] Other Clinician: Referring Provider: Treating Provider/Extender: Harland German LSH, TA [place] Weeks in Treatment: 14 Information Obtained From Patient Hematologic/Lymphatic Medical History: Positive for: Lymphedema Past Medical History Notes: Hypercholesterolemia Respiratory Medical History: Positive for: Asthma Cardiovascular Medical History: Positive for: Hypertension; Peripheral Venous Disease Gastrointestinal Medical History: Past Medical History Notes: GERD Musculoskeletal Medical History: Positive for: Osteoarthritis Past Medical History Notes: Degenerative joint disease of knees Immunizations Pneumococcal Vaccine: Received Pneumococcal Vaccination: No Implantable Devices No devices added Hospitalization / Surgery History Type of Hospitalization/Surgery knee right arthoscopy appendectomy cardiac cath cervical fusion cholecystectomy wisdom tooth Family and Social History Unknown History: Yes; Never smoker; Marital Status - Married; Alcohol Use: Never; Drug Use: No History; Caffeine Use: Daily - coffee; Financial Concerns: No; Food, Clothing or Shelter Needs: No; Support System Lacking: No; Transportation Concerns: No Electronic Signature(s) Signed: 08/02/2021 1:50:47 PM By: Geralyn Corwin DO Signed: 08/02/2021 5:01:49 PM By: Shawn Stall RN, BSN Entered By: Geralyn Corwin on 08/02/2021  13:39:32 -------------------------------------------------------------------------------- SuperBill Details Patient Name: Date of Service: Catherine Powell 08/02/2021 Medical Record Number: [id number] Patient Account Number: [id number] Date of Birth/Sex: Treating RN: 04/30/1965 (56 y.o. 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 E66.01 Morbid (severe) obesity due to excess calories Facility Procedures CPT4 Code: [id number] Description: [phone] - WOUND CARE VISIT-LEV 5 EST PT Modifier: Quantity: 1 Physician Procedures : CPT4 Code Description Modifier [id number] - WC PHYS LEVEL 3 - EST PT ICD-10 Diagnosis Description L97.812 Non-pressure chronic ulcer of other part of right lower leg with fat layer exposed L97.822 Non-pressure chronic ulcer of other part of left  lower leg with fat layer exposed I87.313 Chronic venous hypertension (idiopathic) with ulcer of bilateral lower extremity I89.0 Lymphedema, not elsewhere classified Quantity: 1 Electronic Signature(s) Signed: 08/03/2021 1:47:31 PM By: Shawn Stall RN, BSN Signed: 08/07/2021 9:00:38 AM By: Geralyn Corwin DO Previous Signature: 08/02/2021 1:50:47  PM Version By: Geralyn Corwin DO Entered By: Shawn Stall on 08/03/2021 13:47:31

## 2021-08-02 NOTE — Progress Notes (Addendum)
MARGY, SUMLER ([id number]) Visit Report for 08/02/2021 Arrival Information Details Patient Name: Date of Service: Catherine Powell 08/02/2021 12:45 PM Medical Record Number: [id number] Patient Account Number: [id number] Date of Birth/Sex: Treating RN: [birth year] (56 y.o. [name] Primary Care Eldred Sooy: [facility], [place] Other Clinician: Referring Baylen Dea: Treating Marlaysia Lenig/Extender: Harland German LSH, [place] Weeks in Treatment: 14 Visit Information History Since Last Visit Added or deleted any medications: No Patient Arrived: Wheel Chair Any new allergies or adverse reactions: No Arrival Time: 12:53 Had a fall or experienced change in No Accompanied By: son activities of daily living that may affect Transfer Assistance: None risk of falls: Patient Identification Verified: Yes Signs or symptoms of abuse/neglect since last visito No Secondary Verification Process Completed: Yes Hospitalized since last visit: No Patient Requires Transmission-Based Precautions: No Implantable device outside of the clinic excluding No Patient Has Alerts: No cellular tissue based products placed in the center since last visit: Has Dressing in Place as Prescribed: Yes Has Compression in Place as Prescribed: No Pain Present Now: Yes Notes per patient compression applied at last appt time compression lasted only a day and half before feeling numbness in the toes and the wraps slid down. Ugonna Keirsey made aware. Electronic Signature(s) Signed: 08/02/2021 5:01:49 PM By: Shawn Stall RN, BSN Entered By: Shawn Stall on 08/02/2021 12:55:13 -------------------------------------------------------------------------------- Clinic Level of Care Assessment Details Patient Name: Date of Service: Catherine Powell 08/02/2021 12:45 PM Medical Record Number: [id number] Patient Account Number: [id number] Date of Birth/Sex: Treating RN: [birth year] (56 y.o. [name] Primary Care  Lark Langenfeld: [facility], [place] Other Clinician: Referring Voyd Groft: Treating Austen Oyster/Extender: Harland German LSH, [place] Weeks in Treatment: 14 Clinic Level of Care Assessment Items TOOL 4 Quantity Score X- 1 0 Use when only an EandM is performed on FOLLOW-UP visit ASSESSMENTS - Nursing Assessment / Reassessment X- 1 10 Reassessment of Co-morbidities (includes updates in patient status) X- 1 5 Reassessment of Adherence to Treatment Plan ASSESSMENTS - Wound and Skin A ssessment / Reassessment []  - 0 Simple Wound Assessment / Reassessment - one wound X- 3 5 Complex Wound Assessment / Reassessment - multiple wounds X- 1 10 Dermatologic / Skin Assessment (not related to wound area) ASSESSMENTS - Focused Assessment X- 2 5 Circumferential Edema Measurements - multi extremities X- 1 10 Nutritional Assessment / Counseling / Intervention []  - 0 Lower Extremity Assessment (monofilament, tuning fork, pulses) []  - 0 Peripheral Arterial Disease Assessment (using hand held doppler) ASSESSMENTS - Ostomy and/or Continence Assessment and Care []  - 0 Incontinence Assessment and Management []  - 0 Ostomy Care Assessment and Management (repouching, etc.) PROCESS - Coordination of Care []  - 0 Simple Patient / Family Education for ongoing care X- 1 20 Complex (extensive) Patient / Family Education for ongoing care X- 1 10 Staff obtains , Records, T Results / Process Orders est []  - 0 Staff telephones HHA, Nursing Homes / Clarify orders / etc []  - 0 Routine Transfer to another Facility (non-emergent condition) []  - 0 Routine Hospital Admission (non-emergent condition) []  - 0 New Admissions / / Ordering NPWT Apligraf, etc. , []  - 0 Emergency Hospital Admission (emergent condition) []  - 0 Simple Discharge Coordination X- 1 15 Complex (extensive) Discharge Coordination PROCESS - Special Needs []  - 0 Pediatric / Minor Patient Management []  -  0 Isolation Patient Management []  - 0 Hearing / Language / Visual special needs []  - 0 Assessment of Community assistance (transportation,  D/C planning, etc.)  - 0 Additional assistance / Altered mentation  - 0 Support Surface(s) Assessment (bed, cushion, seat, etc.) INTERVENTIONS - Wound Cleansing / Measurement  - 0 Simple Wound Cleansing - one wound X- 3 5 Complex Wound Cleansing - multiple wounds X- 1 5 Wound Imaging (photographs - any number of wounds)  - 0 Wound Tracing (instead of photographs)  - 0 Simple Wound Measurement - one wound X- 3 5 Complex Wound Measurement - multiple wounds INTERVENTIONS - Wound Dressings  - 0 Small Wound Dressing one or multiple wounds X- 2 15 Medium Wound Dressing one or multiple wounds  - 0 Large Wound Dressing one or multiple wounds  - 0 Application of Medications - topical  - 0 Application of Medications - injection INTERVENTIONS - Miscellaneous  - 0 External ear exam  - 0 Specimen Collection (cultures, biopsies, blood, body fluids, etc.)  - 0 Specimen(s) / Culture(s) sent or taken to Lab for analysis  - 0 Patient Transfer (multiple staff / Nurse, adult / Similar devices)  - 0 Simple Staple / Suture removal (25 or less)  - 0 Complex Staple / Suture removal (26 or more)  - 0 Hypo / Hyperglycemic Management (close monitor of Blood Glucose)  - 0 Ankle / Brachial Index (ABI) - do not check if billed separately X- 1 5 Vital Signs Has the patient been seen at the hospital within the last three years: Yes Total Score: 175 Level Of Care: New/Established - Level 5 Electronic Signature(s) Signed: 08/03/2021 3:31:44 PM By: Shawn Stall RN, BSN Entered By: Shawn Stall on 08/03/2021 13:47:11 -------------------------------------------------------------------------------- Lower Extremity Assessment Details Patient Name: Date of Service: Catherine Powell. 08/02/2021 12:45 PM Medical Record  Number: [id number] Patient Account Number: [id number] Date of Birth/Sex: Treating RN: Feb 18, 1966 (56 y.o. [name] Primary Care Divya Munshi: [facility], [place] Other Clinician: Referring Deania Siguenza: Treating Quintavia Rogstad/Extender: Harland German LSH, TA [place] Weeks in Treatment: 14 Edema Assessment Assessed: [Left: Yes] [Right: Yes] Edema: [Left: Yes] [Right: Yes] Calf Left: Right: Point of Measurement: From Medial Instep 71 cm 67 cm Ankle Left: Right: Point of Measurement: From Medial Instep 39 cm 34 cm Electronic Signature(s) Signed: 08/02/2021 5:01:49 PM By: Shawn Stall RN, BSN Entered By: Shawn Stall on 08/02/2021 12:57:48 -------------------------------------------------------------------------------- Multi Wound Chart Details Patient Name: Date of Service: Catherine Powell. 08/02/2021 12:45 PM Medical Record Number: [id number] Patient Account Number: [id number] Date of Birth/Sex: Treating RN: 10-15-1965 (56 y.o. [name] Primary Care Javarus Dorner: [facility], [place] Other Clinician: Referring Don Giarrusso: Treating Jakia Kennebrew/Extender: Harland German LSH, TA [place] Weeks in Treatment: 14 Vital Signs Height(in): 68 Pulse(bpm): 90 Weight(lbs): 430 Blood Pressure(mmHg): 138/81 Body Mass Index(BMI): 65.4 Temperature(F): 98.2 Respiratory Rate(breaths/min): 22 Photos: Right, Posterior Lower Leg Left, Posterior Lower Leg Left, Lateral Lower Leg Wound Location: Gradually Appeared Gradually Appeared Gradually Appeared Wounding Event: Venous Leg Ulcer Lymphedema Lymphedema Primary Etiology: Lymphedema, Asthma, Hypertension, Lymphedema, Asthma, Hypertension, Lymphedema, Asthma, Hypertension, Comorbid History: Peripheral Venous Disease, Peripheral Venous Disease, Peripheral Venous Disease, Osteoarthritis Osteoarthritis Osteoarthritis 07/03/2021 07/26/2021 07/26/2021 Date Acquired: Weeks of Treatment: Open Open Open Wound Status: No No No Wound Recurrence: Yes Yes  No Clustered Wound: 2 1 Powell/A Clustered Quantity: 1x4.7x0.2 0.5x0.5x0.1 0.5x0.9x0.2 Measurements L x W x D (cm) 3.691 0.196 0.353 A (cm) : rea 0.738 0.02 0.071 Volume (cm) : 68.70% 97.20% -28.40% % Reduction in Area: 68.70% 97.20% 35.50% % Reduction in Volume: Full Thickness Without Exposed Full Thickness With  Exposed Support Unclassifiable Classification: Support Structures Structures Medium Medium Medium Exudate Amount: Serosanguineous Serosanguineous Serosanguineous Exudate Type: red, brown red, brown red, brown Exudate Color: Distinct, outline attached Distinct, outline attached Distinct, outline attached Wound Margin: Medium (34-66%) Large (67-100%) None Present (0%) Granulation Amount: Red, Pink Red, Pink Powell/A Granulation Quality: Medium (34-66%) None Present (0%) Large (67-100%) Necrotic Amount: Fat Layer (Subcutaneous Tissue): Yes Fat Layer (Subcutaneous Tissue): Yes Fascia: No Exposed Structures: Fascia: No Fascia: No Fat Layer (Subcutaneous Tissue): No Tendon: No Tendon: No Tendon: No Muscle: No Muscle: No Muscle: No Joint: No Joint: No Joint: No Bone: No Bone: No Bone: No Small (1-33%) Large (67-100%) Small (1-33%) Epithelialization: Treatment Notes Electronic Signature(s) Signed: 08/02/2021 1:50:47 PM By: Geralyn CorwinHoffman, Jessica DO Signed: 08/02/2021 5:01:49 PM By: Shawn Stalleaton, [name] RN, BSN Entered By: Geralyn CorwinHoffman, Jessica on 08/02/2021 13:38:23 -------------------------------------------------------------------------------- Multi-Disciplinary Care Plan Details Patient Name: Date of Service: Catherine FlakesBRIGMA Powell, Catherine ERLY H. 08/02/2021 12:45 PM Medical Record Number: [id number] Patient Account Number: [id number] Date of Birth/Sex: Treating RN: 05/24/1965 (56 y.o. Arta SilenceF) [name], [name] [facility] Care [facility] [place] Other Clinician: Referring Quinlyn Tep: Treating Jalicia Roszak/Extender: Harland GermanHoffman, Jessica [place] LSH, [place] Weeks in Treatment: 14 Multidisciplinary Care Plan  reviewed with physician Active Inactive Wound/Skin Impairment Nursing Diagnoses: Impaired tissue integrity Knowledge deficit related to ulceration/compromised skin integrity Goals: Patient/caregiver will verbalize understanding of skin care regimen Date Initiated: 04/20/2021 Target Resolution Date: 09/07/2021 Goal Status: Active Interventions: Assess patient/caregiver ability to obtain necessary supplies Assess patient/caregiver ability to perform ulcer/skin care regimen upon admission and as needed Assess ulceration(s) every visit Provide education on ulcer and skin care Notes: Electronic Signature(s) Signed: 08/02/2021 5:01:49 PM By: Shawn Stalleaton, [name] RN, BSN Entered By: Shawn Stalleaton, [name] on 08/02/2021 13:18:27 -------------------------------------------------------------------------------- Pain Assessment Details Patient Name: Date of Service: Catherine FlakesBRIGMA Powell, Catherine ERLY H. 08/02/2021 12:45 PM Medical Record Number: [id number] Patient Account Number: [id number] Date of Birth/Sex: Treating RN: 12/04/1965 (56 y.o. Arta SilenceF) [name], [name] Primary Care Sha[facility] [place] Other Clinician: Referring Mendy Lapinsky: Treating Kyrel Leighton/Extender: Harland GermanHoffman, Jessica [place] LSH, [place] Weeks in Treatment: 14 Active Problems Location of Pain Severity and Description of Pain Patient Has Paino Yes Site Locations Pain Location: Generalized Pain, Pain in Ulcers Rate the pain. Current Pain Level: 5 Worst Pain Level: 10 Least Pain Level: 0 Tolerable Pain Level: 8 Pain Management and Medication Current Pain Management: Medication: No Cold Application: No Rest: No Massage: No Activity: No T.E.Powell.S.: No Heat Application: No Leg drop or elevation: No Is the Current Pain Management Adequate: Adequate How does your wound impact your activities of daily livingo Sleep: No Bathing: No Appetite: No Relationship With Others: No Bladder Continence: No Emotions: No Bowel Continence: No Work: No Toileting: No Drive:  No Dressing: No Hobbies: No Psychologist, prison and probation serviceslectronic Signature(s) Signed: 08/02/2021 5:01:49 PM By: Shawn Stalleaton, [name] RN, BSN Entered By: Shawn Stalleaton, [name] on 08/02/2021 12:55:45 -------------------------------------------------------------------------------- Patient/Caregiver Education Details Patient Name: Date of Service: Catherine FlakesBRIGMA Powell, Catherine ERLY H. 5/25/2023andnbsp12:45 PM Medical Record Number: [id number] Patient Account Number: [id number] Date of Birth/Gender: Treating RN: 10/18/1965 (56 y.o. Arta SilenceF) [name], [name] Primary [facility] [place] Other Clinician: Referring Physician: Treating Physician/Extender: Harland GermanHoffman, Jessica [place] L[facility], [place] Weeks in Treatment: 14 Education Assessment Education Provided To: Patient Education Topics Provided Wound/Skin Impairment: Handouts: Skin Care Do's and Dont's Methods: Explain/Verbal Responses: Reinforcements needed Electronic Signature(s) Signed: 08/02/2021 5:01:49 PM By: Shawn Stalleaton, [name] RN, BSN Entered By: Shawn Stalleaton, [name] on 08/02/2021 13:18:40 -------------------------------------------------------------------------------- Wound Assessment Details Patient Name: Date of Service: Catherine HockBRIGMA Powell, Catherine ERLY  H. 08/02/2021 12:45 PM Medical Record Number: [id number] Patient Account Number: [id number] Date of Birth/Sex: Treating RN: [birth year] (56 y.o. [name] Primary Care Mohamedamin Nifong: [facility], [place] Other Clinician: Referring Bria Sparr: Treating Amador Braddy/Extender: Harland German LSH, [place] Weeks in Treatment: 14 Wound Status Wound Number: 4 Primary Venous Leg Ulcer Etiology: Wound Location: Right, Posterior Lower Leg Wound Open Wounding Event: Gradually Appeared Status: Date Acquired: 07/03/2021 Comorbid Lymphedema, Asthma, Hypertension, Peripheral Venous Weeks Of Treatment: 4 History: Disease, Osteoarthritis Clustered Wound: Yes Photos Wound Measurements Length: (cm) 1 Width: (cm) 4.7 Depth: (cm) 0.2 Clustered Quantity: 2 Area: (cm)  3.691 Volume: (cm) 0.738 % Reduction in Area: 68.7% % Reduction in Volume: 68.7% Epithelialization: Small (1-33%) Tunneling: No Undermining: No Wound Description Classification: Full Thickness Without Exposed Support Structures Wound Margin: Distinct, outline attached Exudate Amount: Medium Exudate Type: Serosanguineous Exudate Color: red, brown Foul Odor After Cleansing: No Slough/Fibrino Yes Wound Bed Granulation Amount: Medium (34-66%) Exposed Structure Granulation Quality: Red, Pink Fascia Exposed: No Necrotic Amount: Medium (34-66%) Fat Layer (Subcutaneous Tissue) Exposed: Yes Necrotic Quality: Adherent Slough Tendon Exposed: No Muscle Exposed: No Joint Exposed: No Bone Exposed: No Electronic Signature(s) Signed: 08/02/2021 5:01:49 PM By: Shawn Stall RN, BSN Entered By: Shawn Stall on 08/02/2021 13:01:15 -------------------------------------------------------------------------------- Wound Assessment Details Patient Name: Date of Service: Catherine Powell 08/02/2021 12:45 PM Medical Record Number: [id number] Patient Account Number: [id number] Date of Birth/Sex: Treating RN: January 26, 1966 (56 y.o. [name] Primary Care Juwan Vences: [facility], [place] Other Clinician: Referring Katlin Ciszewski: Treating Elonna Mcfarlane/Extender: Harland German LSH, [place] Weeks in Treatment: 14 Wound Status Wound Number: 5 Primary Lymphedema Etiology: Wound Location: Left, Posterior Lower Leg Wound Open Wounding Event: Gradually Appeared Status: Date Acquired: 07/26/2021 Comorbid Lymphedema, Asthma, Hypertension, Peripheral Venous Weeks Of Treatment: 1 History: Disease, Osteoarthritis Clustered Wound: Yes Photos Wound Measurements Length: (cm) 0.5 Width: (cm) 0.5 Depth: (cm) 0.1 Clustered Quantity: 1 Area: (cm) 0. Volume: (cm) 0. % Reduction in Area: 97.2% % Reduction in Volume: 97.2% Epithelialization: Large (67-100%) Tunneling: No 196 Undermining: No 02 Wound  Description Classification: Full Thickness With Exposed Support Structures Wound Margin: Distinct, outline attached Exudate Amount: Medium Exudate Type: Serosanguineous Exudate Color: red, brown Foul Odor After Cleansing: No Slough/Fibrino No Wound Bed Granulation Amount: Large (67-100%) Exposed Structure Granulation Quality: Red, Pink Fascia Exposed: No Necrotic Amount: None Present (0%) Fat Layer (Subcutaneous Tissue) Exposed: Yes Tendon Exposed: No Muscle Exposed: No Joint Exposed: No Bone Exposed: No Electronic Signature(s) Signed: 08/02/2021 5:01:49 PM By: Shawn Stall RN, BSN Entered By: Shawn Stall on 08/02/2021 13:01:38 -------------------------------------------------------------------------------- Wound Assessment Details Patient Name: Date of Service: Catherine Powell 08/02/2021 12:45 PM Medical Record Number: [id number] Patient Account Number: [id number] Date of Birth/Sex: Treating RN: 1965/04/02 (56 y.o. [name] Primary Care Abdurrahman Petersheim: [facility], [place] Other Clinician: Referring Jayshun Galentine: Treating Carson Bogden/Extender: Harland German LSH, [place] Weeks in Treatment: 14 Wound Status Wound Number: 6 Primary Lymphedema Etiology: Wound Location: Left, Lateral Lower Leg Wound Open Wounding Event: Gradually Appeared Status: Date Acquired: 07/26/2021 Comorbid Lymphedema, Asthma, Hypertension, Peripheral Venous Weeks Of Treatment: 1 History: Disease, Osteoarthritis Clustered Wound: No Photos Wound Measurements Length: (cm) 0.5 Width: (cm) 0.9 Depth: (cm) 0.2 Area: (cm) 0.353 Volume: (cm) 0.071 % Reduction in Area: -28.4% % Reduction in Volume: 35.5% Epithelialization: Small (1-33%) Tunneling: No Undermining: No Wound Description Classification: Unclassifiable Wound Margin: Distinct, outline attached Exudate Amount: Medium Exudate Type: Serosanguineous Exudate Color: red, brown Foul Odor After  Cleansing: No Slough/Fibrino Yes Wound  Bed Granulation Amount: None Present (0%) Exposed Structure Necrotic Amount: Large (67-100%) Fascia Exposed: No Necrotic Quality: Adherent Slough Fat Layer (Subcutaneous Tissue) Exposed: No Tendon Exposed: No Muscle Exposed: No Joint Exposed: No Bone Exposed: No Electronic Signature(s) Signed: 08/02/2021 5:01:49 PM By: Shawn Stall RN, BSN Entered By: Shawn Stall on 08/02/2021 13:02:07 -------------------------------------------------------------------------------- Vitals Details Patient Name: Date of Service: Catherine Powell. 08/02/2021 12:45 PM Medical Record Number: [id number] Patient Account Number: [id number] Date of Birth/Sex: Treating RN: Mar 19, 1965 (56 y.o. [name] Primary Care Jocee Kissick: [facility], [place] Other Clinician: Referring Dawayne Ohair: Treating Issak Goley/Extender: Harland German LSH, [place] Weeks in Treatment: 14 Vital Signs Time Taken: 12:50 Temperature (F): 98.2 Height (in): 68 Pulse (bpm): 90 Weight (lbs): 430 Respiratory Rate (breaths/min): 22 Body Mass Index (BMI): 65.4 Blood Pressure (mmHg): 138/81 Reference Range: 80 - 120 mg / dl Electronic Signature(s) Signed: 08/02/2021 5:01:49 PM By: Shawn Stall RN, BSN Entered By: Shawn Stall on 08/02/2021 12:55:28

## 2021-08-04 ENCOUNTER — Other Ambulatory Visit: Payer: Self-pay | Admitting: Family Medicine

## 2021-08-07 ENCOUNTER — Encounter: Payer: Self-pay | Admitting: Family Medicine

## 2021-08-07 ENCOUNTER — Ambulatory Visit (INDEPENDENT_AMBULATORY_CARE_PROVIDER_SITE_OTHER): Payer: BC Managed Care – PPO | Admitting: Family Medicine

## 2021-08-07 VITALS — BP 134/86 | HR 72 | Wt >= 6400 oz

## 2021-08-07 DIAGNOSIS — L97211 Non-pressure chronic ulcer of right calf limited to breakdown of skin: Secondary | ICD-10-CM

## 2021-08-07 DIAGNOSIS — I83012 Varicose veins of right lower extremity with ulcer of calf: Secondary | ICD-10-CM

## 2021-08-07 DIAGNOSIS — G8929 Other chronic pain: Secondary | ICD-10-CM

## 2021-08-07 DIAGNOSIS — I10 Essential (primary) hypertension: Secondary | ICD-10-CM | POA: Diagnosis not present

## 2021-08-07 DIAGNOSIS — I890 Lymphedema, not elsewhere classified: Secondary | ICD-10-CM

## 2021-08-07 DIAGNOSIS — S81801A Unspecified open wound, right lower leg, initial encounter: Secondary | ICD-10-CM

## 2021-08-07 NOTE — Assessment & Plan Note (Signed)
Increase pain medication tablets at last visit.  Patient is not on other medication and seems to be doing well. We will continue hydrocodone 5-325 mg twice daily prn x60 tablets q. monthly PDMP reviewed Narcan nasal spray previously ordered ordered.

## 2021-08-07 NOTE — Assessment & Plan Note (Signed)
Improving and asymptomatic Continue chlorthalidone 25 mg daily Continue Lasix 40 mg daily Continue quinapril 20 mg daily Be met today Follow-up with results Follow-up with PCP in 3 weeks Strict return precautions provided

## 2021-08-07 NOTE — Patient Instructions (Addendum)
Thank you for coming to see me today. It was a pleasure.   We will get some labs today.  If they are abnormal or we need to do something about them, I will call you.  If they are normal, I will send you a message on MyChart (if it is active) or a letter in the mail.  If you don't hear from [place] in 2 weeks, please call the office at the number below.   Continue current blood pressure medications.  Follow-up with wound care as scheduled  You are due for a colonoscopy.  Please use the form that we have given you to schedule this at your convenience.    I have placed an order for your mammogram.  Please call [facility] at [phone] to schedule your appointment within one week.   Recommend Shingles vaccine.  This is a 2 dose series and can be given at your local pharmacy.  Please talk to your pharmacist about this.    Please follow-up with PCP as in 2-3 weeks  If you have any questions or concerns, please do not hesitate to call the office at [phone].  Best,   Carollee Leitz, MD

## 2021-08-07 NOTE — Assessment & Plan Note (Signed)
Leaking improving with increase in Lasix to 40 mg daily Continue current dosing, if Cr ok will increase Lasix to 60 mg daily Continue lymphedema wraps Referral resent to PT for reevaluation of Captains wheelchair Note for work provided until June 15 Follow-up with PCP in 2-3 weeks

## 2021-08-07 NOTE — Progress Notes (Unsigned)
    SUBJECTIVE:   CHIEF COMPLAINT / HPI: follow up blood pressure  Presents for follow up for elevated blood pressure and continued lower extremity edema. Seen in clinic on 05/15 and treated with Lasix was increased to 40 mg daily.  Since then patient reports tolerating medication well. Blood pressure at home has improved to 130's/80's.  Edema has somewhat improved in both legs. Weight has decreased 8 lbs in 15 days. Continues to have issues with lower extremity wounds and is following with wound care. Has had issues with getting appropriate wheelchair fitting to allow to continue to go to work.  Denies any headaches, visual disturbances, chest pain, heart palpitations, shortness of breath, abdominal pain, nausea, vomiting or diarrhea.  PERTINENT  PMH / PSH:  HTN Chronic Lymphedema Lipedema Chronic Venous Insufficiency Venous Stasis dermatitis/Non healing wound ulcers  OBJECTIVE:   BP 134/86   Pulse 72   Wt (!) 428 lb (194.1 kg)   LMP 05/15/2012   SpO2 98%   BMI 65.08 kg/m    General: Alert, no acute distress Cardio: Normal S1 and S2, RRR, no r/m/g Pulm: CTAB, normal work of breathing Abdomen: Bowel sounds normal. Abdomen soft and non-tender.  Extremities: Lower extremities edematous with some improvement in size.  Right lower extremity with wrap in place  ASSESSMENT/PLAN:   Essential hypertension Improving and asymptomatic Continue chlorthalidone 25 mg daily Continue Lasix 40 mg daily Continue quinapril 20 mg daily Be met today Follow-up with results Follow-up with PCP in 3 weeks Strict return precautions provided   Lymphedema Leaking improving with increase in Lasix to 40 mg daily Continue current dosing, if Cr ok will increase Lasix to 60 mg daily Continue lymphedema wraps Referral resent to PT for reevaluation of Captains wheelchair Note for work provided until June 15 Follow-up with PCP in 2-3 weeks  Encounter for chronic pain management Increase pain  medication tablets at last visit.  Patient is not on other medication and seems to be doing well. We will continue hydrocodone 5-325 mg twice daily prn x60 tablets q. monthly PDMP reviewed Narcan nasal spray previously ordered ordered.  Non-healing wound of right lower extremity Non healing wounds Followed by Domingo Mend and has scheduled appointment June 8 Follow up with PCP as needed     Carollee Leitz, MD [place]

## 2021-08-08 LAB — BASIC METABOLIC PANEL
BUN/Creatinine Ratio: 11 (ref 9–23)
BUN: 8 mg/dL (ref 6–24)
CO2: 23 mmol/L (ref 20–29)
Calcium: 9 mg/dL (ref 8.7–10.2)
Chloride: 103 mmol/L (ref 96–106)
Creatinine, Ser: 0.74 mg/dL (ref 0.57–1.00)
Glucose: 83 mg/dL (ref 70–99)
Potassium: 4.2 mmol/L (ref 3.5–5.2)
Sodium: 141 mmol/L (ref 134–144)
eGFR: 94 mL/min/{1.73_m2} (ref >59)

## 2021-08-09 ENCOUNTER — Encounter: Payer: Self-pay | Admitting: Family Medicine

## 2021-08-09 MED ORDER — FUROSEMIDE 40 MG PO TABS: 60.0000 mg | ORAL_TABLET | Freq: Every day | ORAL | 0 refills | Status: DC

## 2021-08-09 NOTE — Assessment & Plan Note (Signed)
Non healing wounds Followed by Vernie Shanks and has scheduled appointment June 8 Follow up with PCP as needed

## 2021-08-14 ENCOUNTER — Encounter: Payer: Self-pay | Admitting: *Deleted

## 2021-08-15 ENCOUNTER — Encounter (HOSPITAL_BASED_OUTPATIENT_CLINIC_OR_DEPARTMENT_OTHER): Payer: BC Managed Care – PPO | Attending: Internal Medicine | Admitting: Internal Medicine

## 2021-08-15 DIAGNOSIS — Z993 Dependence on wheelchair: Secondary | ICD-10-CM | POA: Insufficient documentation

## 2021-08-15 DIAGNOSIS — I87313 Chronic venous hypertension (idiopathic) with ulcer of bilateral lower extremity: Secondary | ICD-10-CM | POA: Insufficient documentation

## 2021-08-15 DIAGNOSIS — I890 Lymphedema, not elsewhere classified: Secondary | ICD-10-CM | POA: Insufficient documentation

## 2021-08-15 DIAGNOSIS — I10 Essential (primary) hypertension: Secondary | ICD-10-CM | POA: Insufficient documentation

## 2021-08-15 DIAGNOSIS — Z6841 Body Mass Index (BMI) 40.0 and over, adult: Secondary | ICD-10-CM | POA: Insufficient documentation

## 2021-08-15 DIAGNOSIS — L97822 Non-pressure chronic ulcer of other part of left lower leg with fat layer exposed: Secondary | ICD-10-CM | POA: Insufficient documentation

## 2021-08-15 DIAGNOSIS — L97812 Non-pressure chronic ulcer of other part of right lower leg with fat layer exposed: Secondary | ICD-10-CM | POA: Insufficient documentation

## 2021-08-16 ENCOUNTER — Encounter: Payer: Self-pay | Admitting: Family Medicine

## 2021-08-16 ENCOUNTER — Encounter (HOSPITAL_BASED_OUTPATIENT_CLINIC_OR_DEPARTMENT_OTHER): Payer: BC Managed Care – PPO | Admitting: Internal Medicine

## 2021-08-16 DIAGNOSIS — L97212 Non-pressure chronic ulcer of right calf with fat layer exposed: Secondary | ICD-10-CM | POA: Diagnosis not present

## 2021-08-16 DIAGNOSIS — I890 Lymphedema, not elsewhere classified: Secondary | ICD-10-CM | POA: Diagnosis not present

## 2021-08-16 DIAGNOSIS — L97222 Non-pressure chronic ulcer of left calf with fat layer exposed: Secondary | ICD-10-CM | POA: Diagnosis not present

## 2021-08-16 DIAGNOSIS — L97822 Non-pressure chronic ulcer of other part of left lower leg with fat layer exposed: Secondary | ICD-10-CM | POA: Diagnosis not present

## 2021-08-16 NOTE — Progress Notes (Signed)
SHAKIELA, MINKOFF ([id number]) Visit Report for 08/16/2021 HPI Details Patient Name: Date of Service: Catherine Powell 08/16/2021 12:45 PM Medical Record Number: [id number] Patient Account Number: [id number] Date of Birth/Sex: Treating RN: 24-Nov-1965 (56 y.o. [name] Primary Care Provider: Carollee Leitz Other Clinician: Referring Provider: Treating Provider/Extender: Candelaria Celeste in Treatment: 16 History of Present Illness HPI Description: Admission 04/20/2021 Ms. Rishita Adamek is a 56 year old female with a past medical history of morbid obesity with BMI of 66 and wheelchair dependent due to this, lymphedema and hypertension that presents to the clinic for a 58-month history of nonhealing ulcer to the right calf. She states this started spontaneously and has been using Unna boots and Xeroform with benefit She saw vein and vascular who recommended bariatric surgery. She currently denies systemic signs of infection. 2/17; patient presents for follow-up. She tolerated the compression wrap well. She does report increased tenderness to the surrounding wound bed. 2/24; patient presents for follow-up. She reports taking Keflex with improvement in her symptoms. She has developed some vaginal itching and reports a history of yeast infection after taking antibiotics. She has her juxta lite compression today. 3/27; patient presents for follow-up. Patient states that she has had 2 areas open up to the right lower extremity over the past week. She was started on Keflex by her primary care physician for potential cellulitis. She has been using her juxta light compression daily without issues. She also has a compression stocking to the left lower extremity that she has been using since discharge from clinic on 2/24. 4/4; patient presents for follow-up. She reports that the compression wrap placed in clinic last visit slid down the following day. She has been using  silver alginate to the wound site and using her juxta lite compression daily. She has no issues or complaints today. She would like a note to be out of work so that she can elevate her legs to help heal the wounds. 4/11; the patient arrives today with the area on her right posterior calf totally closed. She has severe lymphedema but has juxta lite stockings and new compression pumps arrive this morning she has not yet used them. 4/26; patient presents with reopening of her previous wounds on the right posterior calf Over the past week. She states she has been using her juxta lite compressions and lymphedema pumps. She is not on a diuretic. 5/18; patient presents for follow-up. She continues to have a wound to the right posterior calf and has developed new wounds to the left lower extremity. She states these happened spontaneously over the past week. She has a juxta light compression however has not been wearing this to the left lower extremity. She has been wearing her juxta lite to the right lower extremity. She denies signs of infection. 5/25; patient presents for follow-up. The compression wrap slid off 1 to 2 days after it was placed in office last week. She has been using silver alginate daily to the wound beds. She has also been wearing her juxta light compression. She currently denies signs of infection. 6/8; patient with bilateral lymphedema wounds on the right greater than left posterior calf. In fact the areas on the left are close to being closed. There is some degree of venous inflammation and skin changes from chronic edema. She is using Iodoflex to the wounds She has pumps at home that she is using twice a day. She has an appointment to make custom made stockings. Apparently the  juxta lites are not staying on very well and even our compression wraps do not stay up for the patient Electronic Signature(s) Signed: 08/16/2021 4:23:09 PM By: Linton Ham MD Entered By: Linton Ham on  08/16/2021 13:28:30 -------------------------------------------------------------------------------- Physical Exam Details Patient Name: Date of Service: Catherine Powell. 08/16/2021 12:45 PM Medical Record Number: [id number] Patient Account Number: [id number] Date of Birth/Sex: Treating RN: 1965-05-14 (56 y.o. [name] Primary Care Provider: Carollee Leitz Other Clinician: Referring Provider: Treating Provider/Extender: Candelaria Celeste in Treatment: 89 Constitutional Patient is hypertensive.. Pulse regular and within target range for patient.Marland Kitchen Respirations regular, non-labored and within target range.. Temperature is normal and within the target range for the patient.Marland Kitchen Appears in no distress. Notes Wound exam Right lower extremity 2 small open areas better looking tissue no debridement. Left lower extremity still a small open area however this looks a lot better She has bilateral venous inflammation significant skin changes of chronic lymphedema no evidence of infection nothing looks acute Electronic Signature(s) Signed: 08/16/2021 4:23:09 PM By: Linton Ham MD Entered By: Linton Ham on 08/16/2021 13:32:05 -------------------------------------------------------------------------------- Physician Orders Details Patient Name: Date of Service: Catherine Powell. 08/16/2021 12:45 PM Medical Record Number: [id number] Patient Account Number: [id number] Date of Birth/Sex: Treating RN: October 21, 1965 (56 y.o. Helene Shoe, Tammi Klippel Primary Care Provider: Carollee Leitz Other Clinician: Referring Provider: Treating Provider/Extender: Candelaria Celeste in Treatment: 16 Verbal / Phone Orders: No Diagnosis Coding ICD-10 Coding Code Description [id number] Non-pressure chronic ulcer of other part of right lower leg with fat layer exposed L97.822 Non-pressure chronic ulcer of other part of left lower leg with fat layer exposed I87.313 Chronic venous  hypertension (idiopathic) with ulcer of bilateral lower extremity I89.0 Lymphedema, not elsewhere classified Z99.3 Dependence on wheelchair E66.01 Morbid (severe) obesity due to excess calories Follow-up Appointments ppointment in 2 weeks. - Dr. Heber [place] and [place], Room 8 08/30/2021 1245pm Return A Other: - Keep scheduled appointment with [facility] for compression stockings at [place]. ***Once you get the compression stockings wear in place of the juxtalite HD.*** Bathing/ Shower/ Hygiene May shower and wash wound with soap and water. Edema Control - Lymphedema / SCD / Other Lymphedema Pumps. Use Lymphedema pumps on leg(s) 2-3 times a day for 45-60 minutes. If wearing any wraps or hose, do not remove them. Continue exercising as instructed. - 3 times a day for an hour each time lymphedema pumps. Elevate legs to the level of the heart or above for 30 minutes daily and/or when sitting, a frequency of: - throughout the day. Avoid standing for long periods of time. Exercise regularly Moisturize legs daily. - apply left leg every night before bed. Compression stocking or Garment 20-30 mm/Hg pressure to: - Apply juxtalite HD in the morning and remove at night to both legs. Wound Treatment Wound #4 - Lower Leg Wound Laterality: Right, Posterior Cleanser: Wound Cleanser (Generic) Every Other Day/15 Days Discharge Instructions: Cleanse the wound with wound cleanser prior to applying a clean dressing using gauze sponges, not tissue or cotton balls. Peri-Wound Care: Zinc Oxide Ointment 30g tube Every Other Day/15 Days Discharge Instructions: Apply Zinc Oxide to periwound with each dressing change Peri-Wound Care: Sween Lotion (Moisturizing lotion) Every Other Day/15 Days Discharge Instructions: Apply moisturizing lotion as directed Prim Dressing: IODOFLEX 0.9% Cadexomer Iodine Pad 4x6 cm Every Other Day/15 Days ary Discharge Instructions: Apply to wound bed as  instructed Secondary Dressing: ABD Pad, 5x9 Every Other Day/15 Days  Discharge Instructions: Apply over primary dressing as directed. Secondary Dressing: Woven Gauze Sponge, Non-Sterile 4x4 in (Generic) Every Other Day/15 Days Discharge Instructions: Apply over primary dressing as directed. Wound #5 - Lower Leg Wound Laterality: Left, Posterior Cleanser: Wound Cleanser (Generic) Every Other Day/15 Days Discharge Instructions: Cleanse the wound with wound cleanser prior to applying a clean dressing using gauze sponges, not tissue or cotton balls. Peri-Wound Care: Zinc Oxide Ointment 30g tube Every Other Day/15 Days Discharge Instructions: Apply Zinc Oxide to periwound with each dressing change Peri-Wound Care: Sween Lotion (Moisturizing lotion) Every Other Day/15 Days Discharge Instructions: Apply moisturizing lotion as directed Prim Dressing: IODOFLEX 0.9% Cadexomer Iodine Pad 4x6 cm Every Other Day/15 Days ary Discharge Instructions: Apply to wound bed as instructed Secondary Dressing: ABD Pad, 5x9 Every Other Day/15 Days Discharge Instructions: Apply over primary dressing as directed. Secondary Dressing: Woven Gauze Sponge, Non-Sterile 4x4 in (Generic) Every Other Day/15 Days Discharge Instructions: Apply over primary dressing as directed. Wound #6 - Lower Leg Wound Laterality: Left, Lateral Cleanser: Wound Cleanser (Generic) Every Other Day/15 Days Discharge Instructions: Cleanse the wound with wound cleanser prior to applying a clean dressing using gauze sponges, not tissue or cotton balls. Peri-Wound Care: Zinc Oxide Ointment 30g tube Every Other Day/15 Days Discharge Instructions: Apply Zinc Oxide to periwound with each dressing change Peri-Wound Care: Sween Lotion (Moisturizing lotion) Every Other Day/15 Days Discharge Instructions: Apply moisturizing lotion as directed Prim Dressing: IODOFLEX 0.9% Cadexomer Iodine Pad 4x6 cm Every Other Day/15 Days ary Discharge Instructions: Apply  to wound bed as instructed Secondary Dressing: ABD Pad, 5x9 Every Other Day/15 Days Discharge Instructions: Apply over primary dressing as directed. Secondary Dressing: Woven Gauze Sponge, Non-Sterile 4x4 in (Generic) Every Other Day/15 Days Discharge Instructions: Apply over primary dressing as directed. Wound #7 - Lower Leg Wound Laterality: Right, Posterior, Proximal Cleanser: Wound Cleanser (Generic) Every Other Day/15 Days Discharge Instructions: Cleanse the wound with wound cleanser prior to applying a clean dressing using gauze sponges, not tissue or cotton balls. Peri-Wound Care: Zinc Oxide Ointment 30g tube Every Other Day/15 Days Discharge Instructions: Apply Zinc Oxide to periwound with each dressing change Peri-Wound Care: Sween Lotion (Moisturizing lotion) Every Other Day/15 Days Discharge Instructions: Apply moisturizing lotion as directed Prim Dressing: IODOFLEX 0.9% Cadexomer Iodine Pad 4x6 cm Every Other Day/15 Days ary Discharge Instructions: Apply to wound bed as instructed Secondary Dressing: ABD Pad, 5x9 Every Other Day/15 Days Discharge Instructions: Apply over primary dressing as directed. Secondary Dressing: Woven Gauze Sponge, Non-Sterile 4x4 in (Generic) Every Other Day/15 Days Discharge Instructions: Apply over primary dressing as directed. Electronic Signature(s) Signed: 08/16/2021 4:23:09 PM By: Linton Ham MD Signed: 08/16/2021 4:29:12 PM By: Deon Pilling RN, BSN Entered By: Deon Pilling on 08/16/2021 13:23:32 -------------------------------------------------------------------------------- Problem List Details Patient Name: Date of Service: Catherine Powell. 08/16/2021 12:45 PM Medical Record Number: [id number] Patient Account Number: [id number] Date of Birth/Sex: Treating RN: 01-07-1966 (56 y.o. Helene Shoe, Tammi Klippel Primary Care Provider: Carollee Leitz Other Clinician: Referring Provider: Treating Provider/Extender: Candelaria Celeste  in Treatment: 16 Active Problems ICD-10 Encounter Code Description Active Date MDM Diagnosis [phone] Non-pressure chronic ulcer of other part of right lower leg with fat layer 04/20/2021 No Yes exposed L97.822 Non-pressure chronic ulcer of other part of left lower leg with fat layer exposed5/18/2023 No Yes I87.313 Chronic venous hypertension (idiopathic) with ulcer of bilateral lower extremity 08/02/2021 No Yes I89.0 Lymphedema, not elsewhere classified 04/20/2021 No Yes Z99.3 Dependence on wheelchair 04/20/2021 No  Yes E66.01 Morbid (severe) obesity due to excess calories 04/20/2021 No Yes Inactive Problems Resolved Problems ICD-10 Code Description Active Date Resolved Date B37.31 Acute candidiasis of vulva and vagina 05/04/2021 05/04/2021 Electronic Signature(s) Signed: 08/16/2021 4:23:09 PM By: Linton Ham MD Entered By: Linton Ham on 08/16/2021 13:27:15 -------------------------------------------------------------------------------- Progress Note Details Patient Name: Date of Service: Catherine Powell. 08/16/2021 12:45 PM Medical Record Number: [id number] Patient Account Number: [id number] Date of Birth/Sex: Treating RN: 25-Feb-1966 (56 y.o. [name] Primary Care Provider: Carollee Leitz Other Clinician: Referring Provider: Treating Provider/Extender: Candelaria Celeste in Treatment: 16 Subjective History of Present Illness (HPI) Admission 04/20/2021 Ms. Abihail Coale is a 56 year old female with a past medical history of morbid obesity with BMI of 66 and wheelchair dependent due to this, lymphedema and hypertension that presents to the clinic for a 66-month history of nonhealing ulcer to the right calf. She states this started spontaneously and has been using Unna boots and Xeroform with benefit She saw vein and vascular who recommended bariatric surgery. She currently denies systemic signs of infection. 2/17; patient presents for follow-up. She  tolerated the compression wrap well. She does report increased tenderness to the surrounding wound bed. 2/24; patient presents for follow-up. She reports taking Keflex with improvement in her symptoms. She has developed some vaginal itching and reports a history of yeast infection after taking antibiotics. She has her juxta lite compression today. 3/27; patient presents for follow-up. Patient states that she has had 2 areas open up to the right lower extremity over the past week. She was started on Keflex by her primary care physician for potential cellulitis. She has been using her juxta light compression daily without issues. She also has a compression stocking to the left lower extremity that she has been using since discharge from clinic on 2/24. 4/4; patient presents for follow-up. She reports that the compression wrap placed in clinic last visit slid down the following day. She has been using silver alginate to the wound site and using her juxta lite compression daily. She has no issues or complaints today. She would like a note to be out of work so that she can elevate her legs to help heal the wounds. 4/11; the patient arrives today with the area on her right posterior calf totally closed. She has severe lymphedema but has juxta lite stockings and new compression pumps arrive this morning she has not yet used them. 4/26; patient presents with reopening of her previous wounds on the right posterior calf Over the past week. She states she has been using her juxta lite compressions and lymphedema pumps. She is not on a diuretic. 5/18; patient presents for follow-up. She continues to have a wound to the right posterior calf and has developed new wounds to the left lower extremity. She states these happened spontaneously over the past week. She has a juxta light compression however has not been wearing this to the left lower extremity. She has been wearing her juxta lite to the right lower  extremity. She denies signs of infection. 5/25; patient presents for follow-up. The compression wrap slid off 1 to 2 days after it was placed in office last week. She has been using silver alginate daily to the wound beds. She has also been wearing her juxta light compression. She currently denies signs of infection. 6/8; patient with bilateral lymphedema wounds on the right greater than left posterior calf. In fact the areas on the left are close to being  closed. There is some degree of venous inflammation and skin changes from chronic edema. She is using Iodoflex to the wounds She has pumps at home that she is using twice a day. She has an appointment to make custom made stockings. Apparently the juxta lites are not staying on very well and even our compression wraps do not stay up for the patient Objective Constitutional Patient is hypertensive.. Pulse regular and within target range for patient.Marland Kitchen Respirations regular, non-labored and within target range.. Temperature is normal and within the target range for the patient.Marland Kitchen Appears in no distress. Vitals Time Taken: 1:00 PM, Height: 68 in, Weight: 430 lbs, BMI: 65.4, Temperature: 98.2 F, Pulse: 80 bpm, Respiratory Rate: 22 breaths/min, Blood Pressure: 174/97 mmHg. General Notes: Wound exam oo Right lower extremity 2 small open areas better looking tissue no debridement. oo Left lower extremity still a small open area however this looks a lot better oo She has bilateral venous inflammation significant skin changes of chronic lymphedema no evidence of infection nothing looks acute Integumentary (Hair, Skin) Wound #4 status is Open. Original cause of wound was Gradually Appeared. The date acquired was: 07/03/2021. The wound has been in treatment 6 weeks. The wound is located on the Right,Posterior Lower Leg. The wound measures 0.7cm length x 0.7cm width x 0.1cm depth; 0.385cm^2 area and 0.038cm^3 volume. There is Fat Layer (Subcutaneous Tissue)  exposed. There is no tunneling or undermining noted. There is a medium amount of serosanguineous drainage noted. The wound margin is distinct with the outline attached to the wound base. There is small (1-33%) red, pink granulation within the wound bed. There is a small (1- 33%) amount of necrotic tissue within the wound bed including Adherent Slough. Wound #5 status is Open. Original cause of wound was Gradually Appeared. The date acquired was: 07/26/2021. The wound has been in treatment 3 weeks. The wound is located on the Left,Posterior Lower Leg. The wound measures 0.1cm length x 0.1cm width x 0.1cm depth; 0.008cm^2 area and 0.001cm^3 volume. There is Fat Layer (Subcutaneous Tissue) exposed. There is no tunneling or undermining noted. There is a medium amount of serosanguineous drainage noted. The wound margin is distinct with the outline attached to the wound base. There is large (67-100%) red, pink granulation within the wound bed. There is no necrotic tissue within the wound bed. Wound #6 status is Open. Original cause of wound was Gradually Appeared. The date acquired was: 07/26/2021. The wound has been in treatment 3 weeks. The wound is located on the Left,Lateral Lower Leg. The wound measures 0.5cm length x 0.8cm width x 0.1cm depth; 0.314cm^2 area and 0.031cm^3 volume. There is no tunneling or undermining noted. There is a medium amount of serosanguineous drainage noted. The wound margin is distinct with the outline attached to the wound base. There is no granulation within the wound bed. There is a large (67-100%) amount of necrotic tissue within the wound bed including Adherent Slough. Wound #7 status is Open. Original cause of wound was Gradually Appeared. The date acquired was: 08/16/2021. The wound is located on the Right,Proximal,Posterior Lower Leg. The wound measures 0.5cm length x 0.5cm width x 0.1cm depth; 0.196cm^2 area and 0.02cm^3 volume. There is Fat Layer (Subcutaneous Tissue)  exposed. There is no tunneling or undermining noted. There is a medium amount of serosanguineous drainage noted. The wound margin is distinct with the outline attached to the wound base. There is small (1-33%) red, pink granulation within the wound bed. There is a large (67-100%) amount  of necrotic tissue within the wound bed. Assessment Active Problems ICD-10 Non-pressure chronic ulcer of other part of right lower leg with fat layer exposed Non-pressure chronic ulcer of other part of left lower leg with fat layer exposed Chronic venous hypertension (idiopathic) with ulcer of bilateral lower extremity Lymphedema, not elsewhere classified Dependence on wheelchair Morbid (severe) obesity due to excess calories Plan Follow-up Appointments: Return Appointment in 2 weeks. - Dr. Heber  and [place], Room 8 08/30/2021 1245pm Other: - Keep scheduled appointment with [facility] for compression stockings at [place]. ***Once you get the compression stockings wear in place of the juxtalite HD.*** Bathing/ Shower/ Hygiene: May shower and wash wound with soap and water. Edema Control - Lymphedema / SCD / Other: Lymphedema Pumps. Use Lymphedema pumps on leg(s) 2-3 times a day for 45-60 minutes. If wearing any wraps or hose, do not remove them. Continue exercising as instructed. - 3 times a day for an hour each time lymphedema pumps. Elevate legs to the level of the heart or above for 30 minutes daily and/or when sitting, a frequency of: - throughout the day. Avoid standing for long periods of time. Exercise regularly Moisturize legs daily. - apply left leg every night before bed. Compression stocking or Garment 20-30 mm/Hg pressure to: - Apply juxtalite HD in the morning and remove at night to both legs. WOUND #4: - Lower Leg Wound Laterality: Right, Posterior Cleanser: Wound Cleanser (Generic) Every Other Day/15 Days Discharge Instructions: Cleanse the wound with wound cleanser prior  to applying a clean dressing using gauze sponges, not tissue or cotton balls. Peri-Wound Care: Zinc Oxide Ointment 30g tube Every Other Day/15 Days Discharge Instructions: Apply Zinc Oxide to periwound with each dressing change Peri-Wound Care: Sween Lotion (Moisturizing lotion) Every Other Day/15 Days Discharge Instructions: Apply moisturizing lotion as directed Prim Dressing: IODOFLEX 0.9% Cadexomer Iodine Pad 4x6 cm Every Other Day/15 Days ary Discharge Instructions: Apply to wound bed as instructed Secondary Dressing: ABD Pad, 5x9 Every Other Day/15 Days Discharge Instructions: Apply over primary dressing as directed. Secondary Dressing: Woven Gauze Sponge, Non-Sterile 4x4 in (Generic) Every Other Day/15 Days Discharge Instructions: Apply over primary dressing as directed. WOUND #5: - Lower Leg Wound Laterality: Left, Posterior Cleanser: Wound Cleanser (Generic) Every Other Day/15 Days Discharge Instructions: Cleanse the wound with wound cleanser prior to applying a clean dressing using gauze sponges, not tissue or cotton balls. Peri-Wound Care: Zinc Oxide Ointment 30g tube Every Other Day/15 Days Discharge Instructions: Apply Zinc Oxide to periwound with each dressing change Peri-Wound Care: Sween Lotion (Moisturizing lotion) Every Other Day/15 Days Discharge Instructions: Apply moisturizing lotion as directed Prim Dressing: IODOFLEX 0.9% Cadexomer Iodine Pad 4x6 cm Every Other Day/15 Days ary Discharge Instructions: Apply to wound bed as instructed Secondary Dressing: ABD Pad, 5x9 Every Other Day/15 Days Discharge Instructions: Apply over primary dressing as directed. Secondary Dressing: Woven Gauze Sponge, Non-Sterile 4x4 in (Generic) Every Other Day/15 Days Discharge Instructions: Apply over primary dressing as directed. WOUND #6: - Lower Leg Wound Laterality: Left, Lateral Cleanser: Wound Cleanser (Generic) Every Other Day/15 Days Discharge Instructions: Cleanse the wound with  wound cleanser prior to applying a clean dressing using gauze sponges, not tissue or cotton balls. Peri-Wound Care: Zinc Oxide Ointment 30g tube Every Other Day/15 Days Discharge Instructions: Apply Zinc Oxide to periwound with each dressing change Peri-Wound Care: Sween Lotion (Moisturizing lotion) Every Other Day/15 Days Discharge Instructions: Apply moisturizing lotion as directed Prim Dressing: IODOFLEX 0.9% Cadexomer Iodine Pad 4x6 cm  Every Other Day/15 Days ary Discharge Instructions: Apply to wound bed as instructed Secondary Dressing: ABD Pad, 5x9 Every Other Day/15 Days Discharge Instructions: Apply over primary dressing as directed. Secondary Dressing: Woven Gauze Sponge, Non-Sterile 4x4 in (Generic) Every Other Day/15 Days Discharge Instructions: Apply over primary dressing as directed. WOUND #7: - Lower Leg Wound Laterality: Right, Posterior, Proximal Cleanser: Wound Cleanser (Generic) Every Other Day/15 Days Discharge Instructions: Cleanse the wound with wound cleanser prior to applying a clean dressing using gauze sponges, not tissue or cotton balls. Peri-Wound Care: Zinc Oxide Ointment 30g tube Every Other Day/15 Days Discharge Instructions: Apply Zinc Oxide to periwound with each dressing change Peri-Wound Care: Sween Lotion (Moisturizing lotion) Every Other Day/15 Days Discharge Instructions: Apply moisturizing lotion as directed Prim Dressing: IODOFLEX 0.9% Cadexomer Iodine Pad 4x6 cm Every Other Day/15 Days ary Discharge Instructions: Apply to wound bed as instructed Secondary Dressing: ABD Pad, 5x9 Every Other Day/15 Days Discharge Instructions: Apply over primary dressing as directed. Secondary Dressing: Woven Gauze Sponge, Non-Sterile 4x4 in (Generic) Every Other Day/15 Days Discharge Instructions: Apply over primary dressing as directed. 1. I have asked the patient to use her compression pumps 3 times a day 2. If we can get the swelling down then it is possible our  compression wraps will stay up. For now she has an appointment for custom made stockings and is difficult to see another solution here. 3. I have asked her to keep her legs elevated. In keeping with this I put her out from work until the end of this month. Electronic Signature(s) Signed: 08/16/2021 4:23:09 PM By: Linton Ham MD Entered By: Linton Ham on 08/16/2021 13:33:22 -------------------------------------------------------------------------------- SuperBill Details Patient Name: Date of Service: Catherine Powell 08/16/2021 Medical Record Number: [id number] Patient Account Number: [id number] Date of Birth/Sex: Treating RN: [birth year] (55 y.o. 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 E66.01 Morbid (severe) obesity due to excess calories Facility Procedures CPT4 Code: [id number] Description: [phone] - WOUND CARE VISIT-LEV 5 EST PT Modifier: Quantity: 1 Physician Procedures : CPT4 Code Description Modifier [id number] - WC PHYS LEVEL 3 - EST PT ICD-10 Diagnosis Description L97.812 Non-pressure chronic ulcer of other part of right lower leg with fat layer exposed L97.822 Non-pressure chronic ulcer of other part of left  lower leg with fat layer exposed I89.0 Lymphedema, not elsewhere classified I87.313 Chronic venous hypertension (idiopathic) with ulcer of bilateral lower extremity Quantity: 1 Electronic Signature(s) Signed: 08/16/2021 4:23:09 PM By: Linton Ham MD Entered By: Linton Ham on 08/16/2021 13:33:41

## 2021-08-16 NOTE — Progress Notes (Signed)
[name], Vinie H. ([id number]) Visit Report for 08/16/2021 Arrival Information Details Patient Name: Date of Service: Catherine Powell, Catherine ERLY H. 08/16/2021 12:45 PM Medical Record Number: [id number] Patient Account Number: [id number] Date of Birth/Sex: Treating RN: 04/14/1965 (56 y.o. Catherine Powell) [name], Catherine KendallBobbi Primary Care Lauryl Seyer: Dana AllanWalsh, Tanya Other Clinician: Referring Margalit Leece: Treating Ryszard Socarras/Extender: Reeves Forthobson, Michael Walsh, Tanya Weeks in Treatment: 16 Visit Information History Since Last Visit Added or deleted any medications: No Patient Arrived: Wheel Chair Any new allergies or adverse reactions: No Arrival Time: 13:03 Had a fall or experienced change in No Accompanied By: son activities of daily living that may affect Transfer Assistance: Manual risk of falls: Patient Identification Verified: Yes Signs or symptoms of abuse/neglect since last visito No Secondary Verification Process Completed: Yes Hospitalized since last visit: No Patient Requires Transmission-Based Precautions: No Implantable device outside of the clinic excluding No Patient Has Alerts: No cellular tissue based products placed in the center since last visit: Has Dressing in Place as Prescribed: Yes Has Compression in Place as Prescribed: Yes Pain Present Now: Yes Notes per patient has an appt with [facility] for fitted compression stockings. per patient for now wearing juxtalite HD. Electronic Signature(s) Signed: 08/16/2021 4:29:12 PM By: Shawn Stalleaton, [name] RN, BSN Entered By: Shawn Stalleaton, [name] on 08/16/2021 13:04:39 -------------------------------------------------------------------------------- Clinic Level of Care Assessment Details Patient Name: Date of Service: Catherine Powell, Catherine ERLY H. 08/16/2021 12:45 PM Medical Record Number: [id number] Patient Account Number: [id number] Date of Birth/Sex: Treating RN: 01/13/1966 (56 y.o. Catherine Powell) [name], Catherine KendallBobbi Primary Care Aleza Pew: Dana AllanWalsh, Tanya Other Clinician: Referring  Marquia Costello: Treating Aluna Whiston/Extender: Reeves Forthobson, Michael Walsh, Tanya Weeks in Treatment: 16 Clinic Level of Care Assessment Items TOOL 4 Quantity Score X- 1 0 Use when only an EandM is performed on FOLLOW-UP visit ASSESSMENTS - Nursing Assessment / Reassessment X- 1 10 Reassessment of Co-morbidities (includes updates in patient status) X- 1 5 Reassessment of Adherence to Treatment Plan ASSESSMENTS - Wound and Skin A ssessment / Reassessment []  - 0 Simple Wound Assessment / Reassessment - one wound X- 4 5 Complex Wound Assessment / Reassessment - multiple wounds X- 1 10 Dermatologic / Skin Assessment (not related to wound area) ASSESSMENTS - Focused Assessment X- 2 5 Circumferential Edema Measurements - multi extremities X- 1 10 Nutritional Assessment / Counseling / Intervention []  - 0 Lower Extremity Assessment (monofilament, tuning fork, pulses) []  - 0 Peripheral Arterial Disease Assessment (using hand held doppler) ASSESSMENTS - Ostomy and/or Continence Assessment and Care []  - 0 Incontinence Assessment and Management []  - 0 Ostomy Care Assessment and Management (repouching, etc.) PROCESS - Coordination of Care []  - 0 Simple Patient / Family Education for ongoing care X- 1 20 Complex (extensive) Patient / Family Education for ongoing care X- 1 10 Staff obtains ChiropractorConsents, Records, T Results / Process Orders est []  - 0 Staff telephones HHA, Nursing Homes / Clarify orders / etc []  - 0 Routine Transfer to another Facility (non-emergent condition) []  - 0 Routine Hospital Admission (non-emergent condition) []  - 0 New Admissions / Manufacturing engineernsurance Authorizations / Ordering NPWT Apligraf, etc. , []  - 0 Emergency Hospital Admission (emergent condition) []  - 0 Simple Discharge Coordination X- 1 15 Complex (extensive) Discharge Coordination PROCESS - Special Needs []  - 0 Pediatric / Minor Patient Management []  - 0 Isolation Patient Management []  - 0 Hearing / Language /  Visual special needs []  - 0 Assessment of Community assistance (transportation, D/C planning, etc.) []  - 0 Additional assistance / Altered mentation []  - 0 Support Surface(s) Assessment (bed,  cushion, seat, etc.) INTERVENTIONS - Wound Cleansing / Measurement []  - 0 Simple Wound Cleansing - one wound X- 4 5 Complex Wound Cleansing - multiple wounds X- 1 5 Wound Imaging (photographs - any number of wounds) []  - 0 Wound Tracing (instead of photographs) []  - 0 Simple Wound Measurement - one wound X- 4 5 Complex Wound Measurement - multiple wounds INTERVENTIONS - Wound Dressings X - Small Wound Dressing one or multiple wounds 2 10 []  - 0 Medium Wound Dressing one or multiple wounds []  - 0 Large Wound Dressing one or multiple wounds X- 1 5 Application of Medications - topical []  - 0 Application of Medications - injection INTERVENTIONS - Miscellaneous []  - 0 External ear exam []  - 0 Specimen Collection (cultures, biopsies, blood, body fluids, etc.) []  - 0 Specimen(s) / Culture(s) sent or taken to Lab for analysis []  - 0 Patient Transfer (multiple staff / / Similar devices) []  - 0 Simple Staple / Suture removal (25 or less) []  - 0 Complex Staple / Suture removal (26 or more) []  - 0 Hypo / Hyperglycemic Management (close monitor of Blood Glucose) []  - 0 Ankle / Brachial Index (ABI) - do not check if billed separately X- 1 5 Vital Signs Has the patient been seen at the hospital within the last three years: Yes Total Score: 185 Level Of Care: New/Established - Level 5 Electronic Signature(s) Signed: 08/16/2021 4:29:12 PM By: RN, BSN Entered By: on 08/16/2021 13:25:31 -------------------------------------------------------------------------------- Encounter Discharge Information Details Patient Name: Date of Service: . 08/16/2021 12:45 PM Medical Record Number: Patient Account Number: Date of  Birth/Sex: Treating RN: Oct 31, 1965 (56 y.o. Primary Care Chett Taniguchi: Other Clinician: Referring Venisha Boehning: Treating Lempi Edwin/Extender: in Treatment: 16 Encounter Discharge Information Items Discharge Condition: Stable Ambulatory Status: Wheelchair Discharge Destination: Home Transportation: Private Auto Accompanied By: family member Schedule Follow-up Appointment: Yes Clinical Summary of Care: Electronic Signature(s) Signed: 08/16/2021 4:29:12 PM By: 10/16/2021 RN, BSN Entered By: Shawn Stall on 08/16/2021 13:25:59 -------------------------------------------------------------------------------- Lower Extremity Assessment Details Patient Name: Date of Service: 10/16/2021. 08/16/2021 12:45 PM Medical Record Number: 10/16/2021 Patient Account Number: [id number] Date of Birth/Sex: Treating RN: 08/01/1965 (56 y.o. 58 Primary Care Matrice Herro: Catherine Powell Other Clinician: Referring Karsyn Jamie: Treating Kerline Trahan/Extender: [name] in Treatment: 16 Edema Assessment Assessed: Reeves Forth: Yes] 10/16/2021: Yes] Edema: [Left: Yes] [Right: Yes] Calf Left: Right: Point of Measurement: From Medial Instep 69 cm 77 cm Ankle Left: Right: Point of Measurement: From Medial Instep 37 cm 37 cm Electronic Signature(s) Signed: 08/16/2021 4:29:12 PM By: Shawn Stall RN, BSN Entered By: 10/16/2021 on 08/16/2021 13:06:44 -------------------------------------------------------------------------------- Multi Wound Chart Details Patient Name: Date of Service: 10/16/2021. 08/16/2021 12:45 PM Medical Record Number: [id number] Patient Account Number: 13/09/1965 Date of Birth/Sex: Treating RN: 10/27/1965 (56 y.o. [name], Reeves Forth Primary Care Amyla Heffner: Kyra Searles Other Clinician: Referring Cormac Wint: Treating Ayen Viviano/Extender: Franne Forts in Treatment: 16 Vital  Signs Height(in): 68 Pulse(bpm): 80 Weight(lbs): 430 Blood Pressure(mmHg): 174/97 Body Mass Index(BMI): 65.4 Temperature(F): 98.2 Respiratory Rate(breaths/min): 22 Photos: Right, Posterior Lower Leg Left, Posterior Lower Leg Left, Lateral Lower Leg Wound Location: Gradually Appeared Gradually Appeared Gradually Appeared Wounding Event: Venous Leg Ulcer Lymphedema Lymphedema Primary Etiology: Lymphedema, Asthma, Hypertension, Lymphedema, Asthma, Hypertension, Lymphedema, Asthma, Hypertension, Comorbid History: Peripheral Venous Disease, Peripheral Venous Disease, Peripheral Venous Disease, Osteoarthritis  Osteoarthritis Osteoarthritis 07/03/2021 07/26/2021 07/26/2021 Date Acquired: Weeks of Treatment: Open Open Open Wound Status: No No No Wound Recurrence: Yes Yes No Clustered Wound: 1 1 Powell/A Clustered Quantity: 0.7x0.7x0.1 0.1x0.1x0.1 0.5x0.8x0.1 Measurements L x W x D (cm) 0.385 0.008 0.314 A (cm) : rea 0.038 0.001 0.031 Volume (cm) : 96.70% 99.90% -14.20% % Reduction in Area: 98.40% 99.90% 71.80% % Reduction in Volume: Full Thickness Without Exposed Full Thickness With Exposed Support Unclassifiable Classification: Support Structures Structures Medium Medium Medium Exudate Amount: Serosanguineous Serosanguineous Serosanguineous Exudate Type: red, brown red, brown red, brown Exudate Color: Distinct, outline attached Distinct, outline attached Distinct, outline attached Wound Margin: Small (1-33%) Large (67-100%) None Present (0%) Granulation Amount: Red, Pink Red, Pink Powell/A Granulation Quality: Small (1-33%) None Present (0%) Large (67-100%) Necrotic Amount: Fat Layer (Subcutaneous Tissue): Yes Fat Layer (Subcutaneous Tissue): Yes Fascia: No Exposed Structures: Fascia: No Fascia: No Fat Layer (Subcutaneous Tissue): No Tendon: No Tendon: No Tendon: No Muscle: No Muscle: No Muscle: No Joint: No Joint: No Joint: No Bone: No Bone: No Bone:  No Small (1-33%) Large (67-100%) Small (1-33%) Epithelialization: Wound Number: 7 Powell/A Powell/A Photos: Powell/A Powell/A Right, Proximal, Posterior Lower Leg Powell/A Powell/A Wound Location: Gradually Appeared Powell/A Powell/A Wounding Event: Lymphedema Powell/A Powell/A Primary Etiology: Lymphedema, Asthma, Hypertension, Powell/A Powell/A Comorbid History: Peripheral Venous Disease, Osteoarthritis 08/16/2021 Powell/A Powell/A Date Acquired: 0 Powell/A Powell/A Weeks of Treatment: Open Powell/A Powell/A Wound Status: No Powell/A Powell/A Wound Recurrence: No Powell/A Powell/A Clustered Wound: Powell/A Powell/A Powell/A Clustered Quantity: 0.5x0.5x0.1 Powell/A Powell/A Measurements L x W x D (cm) 0.196 Powell/A Powell/A A (cm) : rea 0.02 Powell/A Powell/A Volume (cm) : -532.30% Powell/A Powell/A % Reduction in Area: -566.70% Powell/A Powell/A % Reduction in Volume: Full Thickness Without Exposed Powell/A Powell/A Classification: Support Structures Medium Powell/A Powell/A Exudate Amount: Serosanguineous Powell/A Powell/A Exudate Type: red, brown Powell/A Powell/A Exudate Color: Distinct, outline attached Powell/A Powell/A Wound Margin: Small (1-33%) Powell/A Powell/A Granulation Amount: Red, Pink Powell/A Powell/A Granulation Quality: Large (67-100%) Powell/A Powell/A Necrotic Amount: Fat Layer (Subcutaneous Tissue): Yes Powell/A Powell/A Exposed Structures: Fascia: No Tendon: No Muscle: No Joint: No Bone: No Small (1-33%) Powell/A Powell/A Epithelialization: Treatment Notes Wound #4 (Lower Leg) Wound Laterality: Right, Posterior Cleanser Wound Cleanser Discharge Instruction: Cleanse the wound with wound cleanser prior to applying a clean dressing using gauze sponges, not tissue or cotton balls. Peri-Wound Care Zinc Oxide Ointment 30g tube Discharge Instruction: Apply Zinc Oxide to periwound with each dressing change Sween Lotion (Moisturizing lotion) Discharge Instruction: Apply moisturizing lotion as directed Topical Primary Dressing IODOFLEX 0.9% Cadexomer Iodine Pad 4x6 cm Discharge Instruction: Apply to wound bed as instructed Secondary Dressing ABD Pad, 5x9 Discharge Instruction: Apply over  primary dressing as directed. Woven Gauze Sponge, Non-Sterile 4x4 in Discharge Instruction: Apply over primary dressing as directed. Secured With Compression Wrap Compression Stockings Add-Ons Wound #5 (Lower Leg) Wound Laterality: Left, Posterior Cleanser Wound Cleanser Discharge Instruction: Cleanse the wound with wound cleanser prior to applying a clean dressing using gauze sponges, not tissue or cotton balls. Peri-Wound Care Zinc Oxide Ointment 30g tube Discharge Instruction: Apply Zinc Oxide to periwound with each dressing change Sween Lotion (Moisturizing lotion) Discharge Instruction: Apply moisturizing lotion as directed Topical Primary Dressing IODOFLEX 0.9% Cadexomer Iodine Pad 4x6 cm Discharge Instruction: Apply to wound bed as instructed Secondary Dressing ABD Pad, 5x9 Discharge Instruction: Apply over primary dressing as directed. Woven Gauze Sponge, Non-Sterile 4x4 in Discharge Instruction: Apply over primary dressing as directed. Secured With  Compression Wrap Compression Stockings Add-Ons Wound #6 (Lower Leg) Wound Laterality: Left, Lateral Cleanser Wound Cleanser Discharge Instruction: Cleanse the wound with wound cleanser prior to applying a clean dressing using gauze sponges, not tissue or cotton balls. Peri-Wound Care Zinc Oxide Ointment 30g tube Discharge Instruction: Apply Zinc Oxide to periwound with each dressing change Sween Lotion (Moisturizing lotion) Discharge Instruction: Apply moisturizing lotion as directed Topical Primary Dressing IODOFLEX 0.9% Cadexomer Iodine Pad 4x6 cm Discharge Instruction: Apply to wound bed as instructed Secondary Dressing ABD Pad, 5x9 Discharge Instruction: Apply over primary dressing as directed. Woven Gauze Sponge, Non-Sterile 4x4 in Discharge Instruction: Apply over primary dressing as directed. Secured With Compression Wrap Compression Stockings Add-Ons Wound #7 (Lower Leg) Wound Laterality: Right,  Posterior, Proximal Cleanser Wound Cleanser Discharge Instruction: Cleanse the wound with wound cleanser prior to applying a clean dressing using gauze sponges, not tissue or cotton balls. Peri-Wound Care Zinc Oxide Ointment 30g tube Discharge Instruction: Apply Zinc Oxide to periwound with each dressing change Sween Lotion (Moisturizing lotion) Discharge Instruction: Apply moisturizing lotion as directed Topical Primary Dressing IODOFLEX 0.9% Cadexomer Iodine Pad 4x6 cm Discharge Instruction: Apply to wound bed as instructed Secondary Dressing ABD Pad, 5x9 Discharge Instruction: Apply over primary dressing as directed. Woven Gauze Sponge, Non-Sterile 4x4 in Discharge Instruction: Apply over primary dressing as directed. Secured With Compression Wrap Compression Stockings Facilities manager) Signed: 08/16/2021 4:23:09 PM By: Baltazar Najjar MD Signed: 08/16/2021 4:29:12 PM By: Shawn Stall RN, BSN Entered By: Baltazar Najjar on 08/16/2021 13:27:23 -------------------------------------------------------------------------------- Multi-Disciplinary Care Plan Details Patient Name: Date of Service: Catherine Flakes 08/16/2021 12:45 PM Medical Record Number: [id number] Patient Account Number: [id number] Date of Birth/Sex: Treating RN: October 02, 1965 (56 y.o. Catherine Pickett, Catherine Powell Primary Care Labrisha Wuellner: [name] Other Clinician: Referring Piercen Covino: Treating Christopherjame Carnell/Extender: Reeves Forth in Treatment: 16 Multidisciplinary Care Plan reviewed with physician Active Inactive Wound/Skin Impairment Nursing Diagnoses: Impaired tissue integrity Knowledge deficit related to ulceration/compromised skin integrity Goals: Patient/caregiver will verbalize understanding of skin care regimen Date Initiated: 04/20/2021 Target Resolution Date: 09/07/2021 Goal Status: Active Interventions: Assess patient/caregiver ability to obtain necessary supplies Assess  patient/caregiver ability to perform ulcer/skin care regimen upon admission and as needed Assess ulceration(s) every visit Provide education on ulcer and skin care Notes: Electronic Signature(s) Signed: 08/16/2021 4:29:12 PM By: Shawn Stall RN, BSN Entered By: Shawn Stall on 08/16/2021 13:20:34 -------------------------------------------------------------------------------- Pain Assessment Details Patient Name: Date of Service: Catherine Flakes. 08/16/2021 12:45 PM Medical Record Number: [id number] Patient Account Number: [id number] Date of Birth/Sex: Treating RN: December 02, 1965 (56 y.o. Catherine Powell Primary Care Prima Rayner: [name] Other Clinician: Referring Payton Prinsen: Treating Merideth Bosque/Extender: Reeves Forth in Treatment: 16 Active Problems Location of Pain Severity and Description of Pain Patient Has Paino Yes Site Locations Pain Location: Generalized Pain, Pain in Ulcers Rate the pain. Current Pain Level: 6 Pain Management and Medication Current Pain Management: Medication: No Cold Application: No Rest: No Massage: No Activity: No T.E.Powell.S.: No Heat Application: No Leg drop or elevation: No Is the Current Pain Management Adequate: Adequate How does your wound impact your activities of daily livingo Sleep: No Bathing: No Appetite: No Relationship With Others: No Bladder Continence: No Emotions: No Bowel Continence: No Work: No Toileting: No Drive: No Dressing: No Hobbies: No Psychologist, prison and probation services) Signed: 08/16/2021 4:29:12 PM By: Shawn Stall RN, BSN Entered By: Shawn Stall on 08/16/2021 13:05:00 -------------------------------------------------------------------------------- Patient/Caregiver Education Details Patient Name: Date of Service: BRIGMA Powell, Catherine ERLY H.  6/8/2023andnbsp12:45 PM Medical Record Number: [id number] Patient Account Number: [id number] Date of Birth/Gender: Treating RN: [birth year] (56 y.o. Catherine Powell Primary Care Physician: [name] Other Clinician: Referring Physician: Treating Physician/Extender: Reeves Forth in Treatment: 16 Education Assessment Education Provided To: Patient Education Topics Provided Wound/Skin Impairment: Handouts: Skin Care Do's and Dont's Methods: Explain/Verbal Responses: Reinforcements needed Electronic Signature(s) Signed: 08/16/2021 4:29:12 PM By: Shawn Stall RN, BSN Entered By: Shawn Stall on 08/16/2021 13:20:52 -------------------------------------------------------------------------------- Wound Assessment Details Patient Name: Date of Service: Catherine Flakes. 08/16/2021 12:45 PM Medical Record Number: [id number] Patient Account Number: [id number] Date of Birth/Sex: Treating RN: [birth year] (55 y.o. Catherine Powell Primary Care Nuchem Grattan: [name] Other Clinician: Referring Yurani Fettes: Treating Amaru Burroughs/Extender: Reeves Forth in Treatment: 16 Wound Status Wound Number: 4 Primary Venous Leg Ulcer Etiology: Wound Location: Right, Posterior Lower Leg Wound Open Wounding Event: Gradually Appeared Status: Date Acquired: 07/03/2021 Comorbid Lymphedema, Asthma, Hypertension, Peripheral Venous Weeks Of Treatment: 6 History: Disease, Osteoarthritis Clustered Wound: Yes Photos Wound Measurements Length: (cm) 0. Width: (cm) 0. Depth: (cm) 0. Clustered Quantity: 1 Area: (cm) 0 Volume: (cm) 0 7 % Reduction in Area: 96.7% 7 % Reduction in Volume: 98.4% 1 Epithelialization: Small (1-33%) Tunneling: No .385 Undermining: No .038 Wound Description Classification: Full Thickness Without Exposed Support Structures Wound Margin: Distinct, outline attached Exudate Amount: Medium Exudate Type: Serosanguineous Exudate Color: red, brown Foul Odor After Cleansing: No Slough/Fibrino Yes Wound Bed Granulation Amount: Small (1-33%) Exposed Structure Granulation Quality: Red,  Pink Fascia Exposed: No Necrotic Amount: Small (1-33%) Fat Layer (Subcutaneous Tissue) Exposed: Yes Necrotic Quality: Adherent Slough Tendon Exposed: No Muscle Exposed: No Joint Exposed: No Bone Exposed: No Treatment Notes Wound #4 (Lower Leg) Wound Laterality: Right, Posterior Cleanser Wound Cleanser Discharge Instruction: Cleanse the wound with wound cleanser prior to applying a clean dressing using gauze sponges, not tissue or cotton balls. Peri-Wound Care Zinc Oxide Ointment 30g tube Discharge Instruction: Apply Zinc Oxide to periwound with each dressing change Sween Lotion (Moisturizing lotion) Discharge Instruction: Apply moisturizing lotion as directed Topical Primary Dressing IODOFLEX 0.9% Cadexomer Iodine Pad 4x6 cm Discharge Instruction: Apply to wound bed as instructed Secondary Dressing ABD Pad, 5x9 Discharge Instruction: Apply over primary dressing as directed. Woven Gauze Sponge, Non-Sterile 4x4 in Discharge Instruction: Apply over primary dressing as directed. Secured With Compression Wrap Compression Stockings Facilities manager) Signed: 08/16/2021 4:29:12 PM By: Shawn Stall RN, BSN Entered By: Shawn Stall on 08/16/2021 13:13:28 -------------------------------------------------------------------------------- Wound Assessment Details Patient Name: Date of Service: Catherine Flakes 08/16/2021 12:45 PM Medical Record Number: [id number] Patient Account Number: [id number] Date of Birth/Sex: Treating RN: [birth year] (56 y.o. Catherine Pickett, Catherine Powell Primary Care Shayanna Thatch: [name] Other Clinician: Referring Quanita Barona: Treating Fernand Sorbello/Extender: Reeves Forth in Treatment: 16 Wound Status Wound Number: 5 Primary Lymphedema Etiology: Wound Location: Left, Posterior Lower Leg Wound Open Wounding Event: Gradually Appeared Status: Date Acquired: 07/26/2021 Comorbid Lymphedema, Asthma, Hypertension, Peripheral Venous Weeks  Of Treatment: 3 History: Disease, Osteoarthritis Clustered Wound: Yes Photos Wound Measurements Length: (cm) 0.1 Width: (cm) 0.1 Depth: (cm) 0.1 Clustered Quantity: 1 Area: (cm) 0.008 Volume: (cm) 0.001 % Reduction in Area: 99.9% % Reduction in Volume: 99.9% Epithelialization: Large (67-100%) Tunneling: No Undermining: No Wound Description Classification: Full Thickness With Exposed Support Structures Wound Margin: Distinct, outline attached Exudate Amount: Medium Exudate Type: Serosanguineous Exudate Color: red, brown Foul Odor After Cleansing: No Slough/Fibrino No Wound Bed Granulation Amount: Large (67-100%) Exposed  Structure Granulation Quality: Red, Pink Fascia Exposed: No Necrotic Amount: None Present (0%) Fat Layer (Subcutaneous Tissue) Exposed: Yes Tendon Exposed: No Muscle Exposed: No Joint Exposed: No Bone Exposed: No Treatment Notes Wound #5 (Lower Leg) Wound Laterality: Left, Posterior Cleanser Wound Cleanser Discharge Instruction: Cleanse the wound with wound cleanser prior to applying a clean dressing using gauze sponges, not tissue or cotton balls. Peri-Wound Care Zinc Oxide Ointment 30g tube Discharge Instruction: Apply Zinc Oxide to periwound with each dressing change Sween Lotion (Moisturizing lotion) Discharge Instruction: Apply moisturizing lotion as directed Topical Primary Dressing IODOFLEX 0.9% Cadexomer Iodine Pad 4x6 cm Discharge Instruction: Apply to wound bed as instructed Secondary Dressing ABD Pad, 5x9 Discharge Instruction: Apply over primary dressing as directed. Woven Gauze Sponge, Non-Sterile 4x4 in Discharge Instruction: Apply over primary dressing as directed. Secured With Compression Wrap Compression Stockings Facilities manager) Signed: 08/16/2021 4:29:12 PM By: Shawn Stall RN, BSN Entered By: Shawn Stall on 08/16/2021  13:14:13 -------------------------------------------------------------------------------- Wound Assessment Details Patient Name: Date of Service: Catherine Flakes 08/16/2021 12:45 PM Medical Record Number: [id number] Patient Account Number: [id number] Date of Birth/Sex: Treating RN: [birth year] (56 y.o. Catherine Pickett, Catherine Powell Primary Care Cheral Cappucci: [name] Other Clinician: Referring Fawna Cranmer: Treating Treasure Ochs/Extender: Reeves Forth in Treatment: 16 Wound Status Wound Number: 6 Primary Lymphedema Etiology: Wound Location: Left, Lateral Lower Leg Wound Open Wounding Event: Gradually Appeared Status: Date Acquired: 07/26/2021 Comorbid Lymphedema, Asthma, Hypertension, Peripheral Venous Weeks Of Treatment: 3 History: Disease, Osteoarthritis Clustered Wound: No Photos Wound Measurements Length: (cm) 0.5 Width: (cm) 0.8 Depth: (cm) 0.1 Area: (cm) 0.314 Volume: (cm) 0.031 % Reduction in Area: -14.2% % Reduction in Volume: 71.8% Epithelialization: Small (1-33%) Tunneling: No Undermining: No Wound Description Classification: Unclassifiable Wound Margin: Distinct, outline attached Exudate Amount: Medium Exudate Type: Serosanguineous Exudate Color: red, brown Foul Odor After Cleansing: No Slough/Fibrino Yes Wound Bed Granulation Amount: None Present (0%) Exposed Structure Necrotic Amount: Large (67-100%) Fascia Exposed: No Necrotic Quality: Adherent Slough Fat Layer (Subcutaneous Tissue) Exposed: No Tendon Exposed: No Muscle Exposed: No Joint Exposed: No Bone Exposed: No Treatment Notes Wound #6 (Lower Leg) Wound Laterality: Left, Lateral Cleanser Wound Cleanser Discharge Instruction: Cleanse the wound with wound cleanser prior to applying a clean dressing using gauze sponges, not tissue or cotton balls. Peri-Wound Care Zinc Oxide Ointment 30g tube Discharge Instruction: Apply Zinc Oxide to periwound with each dressing change Sween Lotion  (Moisturizing lotion) Discharge Instruction: Apply moisturizing lotion as directed Topical Primary Dressing IODOFLEX 0.9% Cadexomer Iodine Pad 4x6 cm Discharge Instruction: Apply to wound bed as instructed Secondary Dressing ABD Pad, 5x9 Discharge Instruction: Apply over primary dressing as directed. Woven Gauze Sponge, Non-Sterile 4x4 in Discharge Instruction: Apply over primary dressing as directed. Secured With Compression Wrap Compression Stockings Facilities manager) Signed: 08/16/2021 4:29:12 PM By: Shawn Stall RN, BSN Entered By: Shawn Stall on 08/16/2021 13:14:44 -------------------------------------------------------------------------------- Wound Assessment Details Patient Name: Date of Service: Catherine Flakes 08/16/2021 12:45 PM Medical Record Number: [id number] Patient Account Number: [id number] Date of Birth/Sex: Treating RN: 09-Dec-1965 (56 y.o. Catherine Powell Primary Care Quetzaly Ebner: [name] Other Clinician: Referring Meckenzie Balsley: Treating Ankita Newcomer/Extender: Reeves Forth in Treatment: 16 Wound Status Wound Number: 7 Primary Lymphedema Etiology: Wound Location: Right, Proximal, Posterior Lower Leg Wound Open Wounding Event: Gradually Appeared Status: Date Acquired: 08/16/2021 Comorbid Lymphedema, Asthma, Hypertension, Peripheral Venous Weeks Of Treatment: 0 History: Disease, Osteoarthritis Clustered Wound: No Photos Wound Measurements Length: (cm) 0.5 Width: (cm) 0.5  Depth: (cm) 0.1 Area: (cm) 0.196 Volume: (cm) 0.02 % Reduction in Area: -532.3% % Reduction in Volume: -566.7% Epithelialization: Small (1-33%) Tunneling: No Undermining: No Wound Description Classification: Full Thickness Without Exposed Support Structures Wound Margin: Distinct, outline attached Exudate Amount: Medium Exudate Type: Serosanguineous Exudate Color: red, brown Foul Odor After Cleansing: No Slough/Fibrino Yes Wound  Bed Granulation Amount: Small (1-33%) Exposed Structure Granulation Quality: Red, Pink Fascia Exposed: No Necrotic Amount: Large (67-100%) Fat Layer (Subcutaneous Tissue) Exposed: Yes Tendon Exposed: No Muscle Exposed: No Joint Exposed: No Bone Exposed: No Treatment Notes Wound #7 (Lower Leg) Wound Laterality: Right, Posterior, Proximal Cleanser Wound Cleanser Discharge Instruction: Cleanse the wound with wound cleanser prior to applying a clean dressing using gauze sponges, not tissue or cotton balls. Peri-Wound Care Zinc Oxide Ointment 30g tube Discharge Instruction: Apply Zinc Oxide to periwound with each dressing change Sween Lotion (Moisturizing lotion) Discharge Instruction: Apply moisturizing lotion as directed Topical Primary Dressing IODOFLEX 0.9% Cadexomer Iodine Pad 4x6 cm Discharge Instruction: Apply to wound bed as instructed Secondary Dressing ABD Pad, 5x9 Discharge Instruction: Apply over primary dressing as directed. Woven Gauze Sponge, Non-Sterile 4x4 in Discharge Instruction: Apply over primary dressing as directed. Secured With Compression Wrap Compression Stockings Facilities manager) Signed: 08/16/2021 4:29:12 PM By: Shawn Stall RN, BSN Entered By: Shawn Stall on 08/16/2021 13:13:06 -------------------------------------------------------------------------------- Vitals Details Patient Name: Date of Service: Catherine Flakes 08/16/2021 12:45 PM Medical Record Number: [id number] Patient Account Number: [id number] Date of Birth/Sex: Treating RN: 08-24-1965 (56 y.o. Catherine Pickett, Catherine Powell Primary Care Mekhia Brogan: [name] Other Clinician: Referring Merie Wulf: Treating Delaina Fetsch/Extender: Reeves Forth in Treatment: 16 Vital Signs Time Taken: 13:00 Temperature (F): 98.2 Height (in): 68 Pulse (bpm): 80 Weight (lbs): 430 Respiratory Rate (breaths/min): 22 Body Mass Index (BMI): 65.4 Blood Pressure (mmHg):  174/97 Reference Range: 80 - 120 mg / dl Electronic Signature(s) Signed: 08/16/2021 4:29:12 PM By: Shawn Stall RN, BSN Entered By: Shawn Stall on 08/16/2021 13:04:51

## 2021-08-20 ENCOUNTER — Ambulatory Visit: Payer: BC Managed Care – PPO

## 2021-08-24 ENCOUNTER — Encounter: Payer: Self-pay | Admitting: Family Medicine

## 2021-08-30 ENCOUNTER — Encounter (HOSPITAL_BASED_OUTPATIENT_CLINIC_OR_DEPARTMENT_OTHER): Payer: BC Managed Care – PPO | Admitting: Internal Medicine

## 2021-08-31 ENCOUNTER — Encounter: Payer: Self-pay | Admitting: Family Medicine

## 2021-08-31 ENCOUNTER — Ambulatory Visit (INDEPENDENT_AMBULATORY_CARE_PROVIDER_SITE_OTHER): Payer: BC Managed Care – PPO | Admitting: Family Medicine

## 2021-08-31 VITALS — BP 132/86 | HR 76 | Wt >= 6400 oz

## 2021-08-31 DIAGNOSIS — G8929 Other chronic pain: Secondary | ICD-10-CM

## 2021-08-31 DIAGNOSIS — I890 Lymphedema, not elsewhere classified: Secondary | ICD-10-CM

## 2021-08-31 DIAGNOSIS — I10 Essential (primary) hypertension: Secondary | ICD-10-CM

## 2021-08-31 MED ORDER — FUROSEMIDE 80 MG PO TABS: 80.0000 mg | ORAL_TABLET | Freq: Every day | ORAL | 0 refills | Status: DC

## 2021-08-31 NOTE — Progress Notes (Signed)
    SUBJECTIVE:   CHIEF COMPLAINT / HPI: Follow-up for blood pressure check and leg swelling  Presents for follow up for elevated blood pressure and continued lower extremity edema. Seen in clinic on 05/30 and Lasix was increased to 60 mg daily.  Since then patient reports has not been getting as much fluid off her legs with the 60 mg of Lasix.  Weight has increased by 2 pounds since last visit.  Continues to have lower extremity nonhealing ulcers and is followed by wound care.  She reports that pain is tolerable with the medication that she is currently taking.  She has not been able to get her wheelchair arranged due to insurance issues.  She is now having to apply for long-term disability and will need forms completed.  Hypertension Blood pressure remains stable at home.  Denies any dizziness, weakness, chest pain, heart palpitations.  PERTINENT  PMH / PSH:  Hypertension Chronic lymphedema Lymphedema Chronic venous insufficiency Stasis dermatitis/nonhealing wound ulcers.  OBJECTIVE:   BP 132/86   Pulse 76   Wt (!) 430 lb (195 kg)   LMP 05/15/2012   SpO2 98%   BMI 65.38 kg/m    General: Alert, no acute distress Cardio: Normal S1 and S2, RRR, no r/m/g Pulm: CTAB, normal work of breathing Abdomen: Bowel sounds normal. Abdomen soft and non-tender.  Extremities: Continues to have lower extremity edema.   ASSESSMENT/PLAN:   Essential hypertension Continues to improve.  Almost at goal. Refill chlorthalidone 25 mg daily Plan to increase Lasix to 80 mg daily Bmet in 2 weeks. Return precautions provided Follow-up with PCP in 2 weeks.  Encounter for chronic pain management PDMP reviewed Refill Norco 5--325 mg twice daily as needed x60 tablets Prescription for nasal Narcan previously ordered  Lymphedema Increased lower extremity edema bilaterally.  Now not responding to 60 mg Lasix daily Increase Lasix to 80 mg daily. Consider switching Lasix to torsemide if no  improvement.  Discussed with patient about switching today but she opted to go with the increased dose of Lasix. Strict return precautions provided. Follow-up PCP in 2 weeks.   Forms completed for long-term disability and returned to patient.  Copy on file.  [name], MD [facility]

## 2021-09-01 ENCOUNTER — Telehealth: Payer: Self-pay | Admitting: Family Medicine

## 2021-09-02 ENCOUNTER — Other Ambulatory Visit: Payer: Self-pay | Admitting: Family Medicine

## 2021-09-03 ENCOUNTER — Other Ambulatory Visit: Payer: Self-pay

## 2021-09-03 ENCOUNTER — Other Ambulatory Visit (HOSPITAL_COMMUNITY): Payer: Self-pay

## 2021-09-03 ENCOUNTER — Ambulatory Visit: Payer: BC Managed Care – PPO | Attending: Family Medicine

## 2021-09-03 DIAGNOSIS — L97211 Non-pressure chronic ulcer of right calf limited to breakdown of skin: Secondary | ICD-10-CM | POA: Insufficient documentation

## 2021-09-03 DIAGNOSIS — I83012 Varicose veins of right lower extremity with ulcer of calf: Secondary | ICD-10-CM | POA: Insufficient documentation

## 2021-09-03 DIAGNOSIS — M6281 Muscle weakness (generalized): Secondary | ICD-10-CM | POA: Insufficient documentation

## 2021-09-03 DIAGNOSIS — R262 Difficulty in walking, not elsewhere classified: Secondary | ICD-10-CM | POA: Insufficient documentation

## 2021-09-03 DIAGNOSIS — R2681 Unsteadiness on feet: Secondary | ICD-10-CM | POA: Insufficient documentation

## 2021-09-03 DIAGNOSIS — I890 Lymphedema, not elsewhere classified: Secondary | ICD-10-CM | POA: Insufficient documentation

## 2021-09-03 MED ORDER — HYDROCODONE-ACETAMINOPHEN 5-325 MG PO TABS: 1.0000 | ORAL_TABLET | Freq: Two times a day (BID) | ORAL | 0 refills | Status: DC | PRN

## 2021-09-03 MED ORDER — FUROSEMIDE 80 MG PO TABS: 80.0000 mg | ORAL_TABLET | Freq: Every day | ORAL | 0 refills | Status: DC

## 2021-09-03 MED ORDER — HYDROCODONE-ACETAMINOPHEN 5-325 MG PO TABS
1.0000 | ORAL_TABLET | Freq: Two times a day (BID) | ORAL | 0 refills | Status: DC | PRN
  Filled 2021-09-03: qty 60, 30d supply, fill #0

## 2021-09-03 MED ORDER — CHLORTHALIDONE 25 MG PO TABS
25.0000 mg | ORAL_TABLET | Freq: Every day | ORAL | 1 refills | Status: DC
Start: 2021-09-03 — End: 2021-12-11

## 2021-09-03 MED ORDER — FUROSEMIDE 80 MG PO TABS
80.0000 mg | ORAL_TABLET | Freq: Every day | ORAL | 0 refills | Status: DC
  Filled 2021-09-03: qty 60, 60d supply, fill #0

## 2021-09-03 MED ORDER — CHLORTHALIDONE 25 MG PO TABS
25.0000 mg | ORAL_TABLET | Freq: Every day | ORAL | 1 refills | Status: DC
  Filled 2021-09-03: qty 90, 90d supply, fill #0

## 2021-09-04 ENCOUNTER — Encounter (HOSPITAL_BASED_OUTPATIENT_CLINIC_OR_DEPARTMENT_OTHER): Payer: BC Managed Care – PPO | Admitting: Internal Medicine

## 2021-09-04 ENCOUNTER — Encounter: Payer: Self-pay | Admitting: Family Medicine

## 2021-09-04 DIAGNOSIS — L97812 Non-pressure chronic ulcer of other part of right lower leg with fat layer exposed: Secondary | ICD-10-CM

## 2021-09-04 DIAGNOSIS — L97822 Non-pressure chronic ulcer of other part of left lower leg with fat layer exposed: Secondary | ICD-10-CM

## 2021-09-04 NOTE — Assessment & Plan Note (Addendum)
PDMP reviewed Refill Norco 5--325 mg twice daily as needed x60 tablets Prescription for nasal Narcan previously ordered

## 2021-09-18 ENCOUNTER — Encounter (HOSPITAL_BASED_OUTPATIENT_CLINIC_OR_DEPARTMENT_OTHER): Payer: Self-pay | Attending: Internal Medicine | Admitting: Internal Medicine

## 2021-09-18 DIAGNOSIS — Z6841 Body Mass Index (BMI) 40.0 and over, adult: Secondary | ICD-10-CM | POA: Insufficient documentation

## 2021-09-18 DIAGNOSIS — I87313 Chronic venous hypertension (idiopathic) with ulcer of bilateral lower extremity: Secondary | ICD-10-CM | POA: Insufficient documentation

## 2021-09-18 DIAGNOSIS — I10 Essential (primary) hypertension: Secondary | ICD-10-CM | POA: Insufficient documentation

## 2021-09-18 DIAGNOSIS — Z993 Dependence on wheelchair: Secondary | ICD-10-CM | POA: Insufficient documentation

## 2021-09-18 DIAGNOSIS — I890 Lymphedema, not elsewhere classified: Secondary | ICD-10-CM | POA: Insufficient documentation

## 2021-09-18 DIAGNOSIS — L97812 Non-pressure chronic ulcer of other part of right lower leg with fat layer exposed: Secondary | ICD-10-CM | POA: Insufficient documentation

## 2021-09-18 DIAGNOSIS — L97822 Non-pressure chronic ulcer of other part of left lower leg with fat layer exposed: Secondary | ICD-10-CM | POA: Insufficient documentation

## 2021-09-19 NOTE — Progress Notes (Signed)
YOSHIE, KOSEL ([id number]) Visit Report for 09/18/2021 Arrival Information Details Patient Name: Date of Service: Maudie Flakes 09/18/2021 1:30 PM Medical Record Number: [id number] Patient Account Number: [id number] Date of Birth/Sex: Treating RN: 1966/01/17 (57 y.o. [name] Primary Care Kylyn Mcdade: Lianne Moris HN Other Clinician: Referring Jakhia Buxton: Treating Gagandeep Kossman/Extender: Aubery Lapping HN Weeks in Treatment: 21 Visit Information History Since Last Visit Added or deleted any medications: No Patient Arrived: Wheel Chair Any new allergies or adverse reactions: No Arrival Time: 14:00 Had a fall or experienced change in No Accompanied By: son activities of daily living that may affect Transfer Assistance: Manual risk of falls: Patient Identification Verified: Yes Signs or symptoms of abuse/neglect since last visito No Secondary Verification Process Completed: Yes Hospitalized since last visit: No Patient Requires Transmission-Based Precautions: No Implantable device outside of the clinic excluding No Patient Has Alerts: No cellular tissue based products placed in the center since last visit: Has Dressing in Place as Prescribed: Yes Has Compression in Place as Prescribed: Yes Pain Present Now: Yes Electronic Signature(s) Signed: 09/18/2021 6:35:30 PM By: Shawn Stall RN, BSN Entered By: Shawn Stall on 09/18/2021 14:02:56 -------------------------------------------------------------------------------- Encounter Discharge Information Details Patient Name: Date of Service: [name] 09/18/2021 1:30 PM Medical Record Number: [id number] Patient Account Number: [id number] Date of Birth/Sex: Treating RN: 1965/08/08 (56 y.o. [name] Primary Care Rocquel Askren: Lianne Moris HN Other Clinician: Referring Lillion Elbert: Treating Elizjah Noblet/Extender: Aubery Lapping HN Weeks in Treatment: 47 Encounter Discharge Information  Items Post Procedure Vitals Discharge Condition: Stable Temperature (F): 98.5 Ambulatory Status: Wheelchair Pulse (bpm): 86 Discharge Destination: Home Respiratory Rate (breaths/min): 22 Transportation: Private Auto Blood Pressure (mmHg): 173/94 Accompanied By: son Schedule Follow-up Appointment: Yes Clinical Summary of Care: Electronic Signature(s) Signed: 09/18/2021 6:35:30 PM By: Shawn Stall RN, BSN Entered By: Shawn Stall on 09/18/2021 14:29:08 -------------------------------------------------------------------------------- Lower Extremity Assessment Details Patient Name: Date of Service: [name] 09/18/2021 1:30 PM Medical Record Number: [id number] Patient Account Number: [id number] Date of Birth/Sex: Treating RN: April 19, 1965 (56 y.o. [name] Primary Care Edom Schmuhl: Lianne Moris HN Other Clinician: Referring Amit Meloy: Treating Teniyah Seivert/Extender: Junita Push, JO HN Weeks in Treatment: 21 Edema Assessment Assessed: [Left: Yes] [Right: Yes] Edema: [Left: Yes] [Right: Yes] Calf Left: Right: Point of Measurement: From Medial Instep 72 cm 72 cm Ankle Left: Right: Point of Measurement: From Medial Instep 37 cm 37 cm Electronic Signature(s) Signed: 09/18/2021 6:35:30 PM By: Shawn Stall RN, BSN Entered By: Shawn Stall on 09/18/2021 14:06:37 -------------------------------------------------------------------------------- Multi Wound Chart Details Patient Name: Date of Service: [name] 09/18/2021 1:30 PM Medical Record Number: [id number] Patient Account Number: [id number] Date of Birth/Sex: Treating RN: 03/21/1965 (56 y.o. Debara Pickett, Millard.Loa Primary Care Chioke Noxon: Lianne Moris HN Other Clinician: Referring Louisa Favaro: Treating Tilda Samudio/Extender: Aubery Lapping HN Weeks in Treatment: 21 Vital Signs Height(in): 68 Pulse(bpm): 86 Weight(lbs): 430 Blood Pressure(mmHg): 173/94 Body Mass Index(BMI):  65.4 Temperature(F): 98.5 Respiratory Rate(breaths/min): 22 Photos: Right, Posterior Lower Leg Left, Lateral Lower Leg Right, Proximal, Posterior Lower Leg Wound Location: Gradually Appeared Gradually Appeared Gradually Appeared Wounding Event: Venous Leg Ulcer Lymphedema Lymphedema Primary Etiology: Lymphedema, Asthma, Hypertension, Lymphedema, Asthma, Hypertension, Lymphedema, Asthma, Hypertension, Comorbid History: Peripheral Venous Disease, Peripheral Venous Disease, Peripheral Venous Disease, Osteoarthritis Osteoarthritis Osteoarthritis 07/03/2021 07/26/2021 08/16/2021 Date Acquired: 11 7 4  Weeks of Treatment: Open Open Healed - Epithelialized Wound Status: No No No Wound Recurrence:  Yes No No Clustered Wound: 1 [name] [name] Clustered Quantity: 0.2x0.2x0.1 0.4x0.5x0.1 0x0x0 Measurements L x W x D (cm) 0.031 0.157 0 A (cm) : rea 0.003 0.016 0 Volume (cm) : 99.70% 42.90% 100.00% % Reduction in Area: 99.90% 85.50% 100.00% % Reduction in Volume: Full Thickness Without Exposed Full Thickness Without Exposed Full Thickness Without Exposed Classification: Support Structures Support Structures Support Structures Medium Medium None Present Exudate A mount: Serosanguineous Serosanguineous [name] Exudate Type: red, brown red, brown [name] Exudate Color: Distinct, outline attached Distinct, outline attached Distinct, outline attached Wound Margin: Large (67-100%) Small (1-33%) None Present (0%) Granulation A mount: Red, Pink Red, Pink [name] Granulation Quality: Small (1-33%) Large (67-100%) None Present (0%) Necrotic A mount: Fat Layer (Subcutaneous Tissue): Yes Fat Layer (Subcutaneous Tissue): Yes Fascia: No Exposed Structures: Fascia: No Fascia: No Fat Layer (Subcutaneous Tissue): No Tendon: No Tendon: No Tendon: No Muscle: No Muscle: No Muscle: No Joint: No Joint: No Joint: No Bone: No Bone: No Bone: No Medium (34-66%) Medium (34-66%) Large  (67-100%) Epithelialization: [name] Debridement - Excisional [name] Debridement: Pre-procedure Verification/Time Out [name] 14:20 [name] Taken: [name] Lidocaine 5% topical ointment [name] Pain Control: [name] Subcutaneous, Slough [name] Tissue Debrided: [name] Skin/Subcutaneous Tissue [name] Level: [name] 0.2 [name] Debridement A (sq cm): rea [name] Curette [name] Instrument: [name] Minimum [name] Bleeding: [name] Pressure [name] Hemostasis A chieved: [name] 0 [name] Procedural Pain: [name] 0 [name] Post Procedural Pain: [name] Procedure was tolerated well [name] Debridement Treatment Response: [name] 0.4x0.5x0.1 [name] Post Debridement Measurements L x W x D (cm) [name] 0.016 [name] Post Debridement Volume: (cm) [name] Debridement [name] Procedures Performed: Treatment Notes Wound #4 (Lower Leg) Wound Laterality: Right, Posterior Cleanser Wound Cleanser Discharge Instruction: Cleanse the wound with wound cleanser prior to applying a clean dressing using gauze sponges, not tissue or cotton balls. Peri-Wound Care Zinc Oxide Ointment 30g tube Discharge Instruction: Apply Zinc Oxide to periwound with each dressing change Sween Lotion (Moisturizing lotion) Discharge Instruction: Apply moisturizing lotion as directed Topical Primary Dressing IODOFLEX 0.9% Cadexomer Iodine Pad 4x6 cm Discharge Instruction: Apply to wound bed as instructed Secondary Dressing ABD Pad, 5x9 Discharge Instruction: Apply over primary dressing as directed. Woven Gauze Sponge, Non-Sterile 4x4 in Discharge Instruction: Apply over primary dressing as directed. bandaid Discharge Instruction: cover the primary dressing under the abd pad and kerlix. Secured With Compression Wrap Compression Stockings Add-Ons Wound #6 (Lower Leg) Wound Laterality: Left, Lateral Cleanser Wound Cleanser Discharge Instruction: Cleanse the wound with wound cleanser prior to applying a clean dressing using gauze sponges, not tissue or cotton balls. Peri-Wound Care Zinc Oxide Ointment 30g  tube Discharge Instruction: Apply Zinc Oxide to periwound with each dressing change Sween Lotion (Moisturizing lotion) Discharge Instruction: Apply moisturizing lotion as directed Topical Primary Dressing IODOFLEX 0.9% Cadexomer Iodine Pad 4x6 cm Discharge Instruction: Apply to wound bed as instructed Secondary Dressing ABD Pad, 5x9 Discharge Instruction: Apply over primary dressing as directed. Woven Gauze Sponge, Non-Sterile 4x4 in Discharge Instruction: Apply over primary dressing as directed. bandaid Discharge Instruction: cover the primary dressing under the abd pad and kerlix. Secured With Compression Wrap Compression Stockings Facilities managerAdd-Ons Electronic Signature(s) Signed: 09/18/2021 6:35:30 PM By: Shawn Stalleaton, [name] RN, BSN Signed: 09/19/2021 11:30:51 AM By: Geralyn CorwinHoffman, Jessica DO Entered By: Geralyn CorwinHoffman, Jessica on 09/18/2021 16:12:43 -------------------------------------------------------------------------------- Multi-Disciplinary Care Plan Details Patient Name: Date of Service: [name], [name] 09/18/2021 1:30 PM Medical Record Number: [id number] Patient Account Number: [id number] Date of Birth/Sex: Treating RN: 11/04/1965 (56 y.o. Arta SilenceF) [name], [name] Primary Care Marqui Formby: NO  Chilton Si HN Other Clinician: Referring Ngan Qualls: Treating Mildred Tuccillo/Extender: Aubery Lapping HN Weeks in Treatment: 21 Multidisciplinary Care Plan reviewed with physician Active Inactive Wound/Skin Impairment Nursing Diagnoses: Impaired tissue integrity Knowledge deficit related to ulceration/compromised skin integrity Goals: Patient/caregiver will verbalize understanding of skin care regimen Date Initiated: 04/20/2021 Target Resolution Date: 09/28/2021 Goal Status: Active Interventions: Assess patient/caregiver ability to obtain necessary supplies Assess patient/caregiver ability to perform ulcer/skin care regimen upon admission and as needed Assess ulceration(s) every visit Provide  education on ulcer and skin care Notes: Electronic Signature(s) Signed: 09/18/2021 6:35:30 PM By: Shawn Stall RN, BSN Entered By: Shawn Stall on 09/18/2021 14:28:00 -------------------------------------------------------------------------------- Pain Assessment Details Patient Name: Date of Service: [name] 09/18/2021 1:30 PM Medical Record Number: [id number] Patient Account Number: [id number] Date of Birth/Sex: Treating RN: [birth year] (56 y.o. [name] Primary Care Anne Boltz: Lianne Moris HN Other Clinician: Referring Caroly Purewal: Treating Mahli Glahn/Extender: Aubery Lapping HN Weeks in Treatment: 21 Active Problems Location of Pain Severity and Description of Pain Patient Has Paino Yes Site Locations Pain Location: Generalized Pain, Pain in Ulcers Rate the pain. Current Pain Level: 5 Pain Management and Medication Current Pain Management: Medication: No Cold Application: No Rest: No Massage: No Activity: No T.E.N.S.: No Heat Application: No Leg drop or elevation: No Is the Current Pain Management Adequate: Adequate How does your wound impact your activities of daily livingo Sleep: No Bathing: No Appetite: No Relationship With Others: No Bladder Continence: No Emotions: No Bowel Continence: No Work: No Toileting: No Drive: No Dressing: No Hobbies: No Psychologist, prison and probation services) Signed: 09/18/2021 6:35:30 PM By: Shawn Stall RN, BSN Entered By: Shawn Stall on 09/18/2021 14:03:08 -------------------------------------------------------------------------------- Patient/Caregiver Education Details Patient Name: Date of Service: Maudie Flakes 7/11/2023andnbsp1:30 PM Medical Record Number: [id number] Patient Account Number: [id number] Date of Birth/Gender: Treating RN: 04-12-1965 (56 y.o. [name] Primary Care Physician: Lianne Moris HN Other Clinician: Referring Physician: Treating Physician/Extender: Aubery Lapping HN Weeks in Treatment: 21 Education Assessment Education Provided To: Patient Education Topics Provided Wound/Skin Impairment: Handouts: Skin Care Do's and Dont's Methods: Explain/Verbal Responses: Reinforcements needed Electronic Signature(s) Signed: 09/18/2021 6:35:30 PM By: Shawn Stall RN, BSN Entered By: Shawn Stall on 09/18/2021 14:28:10 -------------------------------------------------------------------------------- Wound Assessment Details Patient Name: Date of Service: [name] 09/18/2021 1:30 PM Medical Record Number: [id number] Patient Account Number: [id number] Date of Birth/Sex: Treating RN: [birth year] (56 y.o. Debara Pickett, Millard.Loa Primary Care Tavon Magnussen: Lianne Moris HN Other Clinician: Referring Jamiere Gulas: Treating Kami Kube/Extender: Junita Push, JO HN Weeks in Treatment: 21 Wound Status Wound Number: 4 Primary Venous Leg Ulcer Etiology: Wound Location: Right, Posterior Lower Leg Wound Open Wounding Event: Gradually Appeared Status: Date Acquired: 07/03/2021 Comorbid Lymphedema, Asthma, Hypertension, Peripheral Venous Weeks Of Treatment: 11 History: Disease, Osteoarthritis Clustered Wound: Yes Photos Wound Measurements Length: (cm) 0.2 Width: (cm) 0.2 Depth: (cm) 0.1 Clustered Quantity: 1 Area: (cm) 0.0 Volume: (cm) 0.0 % Reduction in Area: 99.7% % Reduction in Volume: 99.9% Epithelialization: Medium (34-66%) Tunneling: No 31 Undermining: No 03 Wound Description Classification: Full Thickness Without Exposed Support Structures Wound Margin: Distinct, outline attached Exudate Amount: Medium Exudate Type: Serosanguineous Exudate Color: red, brown Foul Odor After Cleansing: No Slough/Fibrino Yes Wound Bed Granulation Amount: Large (67-100%) Exposed Structure Granulation Quality: Red, Pink Fascia Exposed: No Necrotic Amount: Small (1-33%) Fat Layer (Subcutaneous Tissue) Exposed: Yes Necrotic  Quality: Adherent Slough Tendon Exposed: No Muscle Exposed: No Joint  Exposed: No Bone Exposed: No Treatment Notes Wound #4 (Lower Leg) Wound Laterality: Right, Posterior Cleanser Wound Cleanser Discharge Instruction: Cleanse the wound with wound cleanser prior to applying a clean dressing using gauze sponges, not tissue or cotton balls. Peri-Wound Care Zinc Oxide Ointment 30g tube Discharge Instruction: Apply Zinc Oxide to periwound with each dressing change Sween Lotion (Moisturizing lotion) Discharge Instruction: Apply moisturizing lotion as directed Topical Primary Dressing IODOFLEX 0.9% Cadexomer Iodine Pad 4x6 cm Discharge Instruction: Apply to wound bed as instructed Secondary Dressing ABD Pad, 5x9 Discharge Instruction: Apply over primary dressing as directed. Woven Gauze Sponge, Non-Sterile 4x4 in Discharge Instruction: Apply over primary dressing as directed. bandaid Discharge Instruction: cover the primary dressing under the abd pad and kerlix. Secured With Compression Wrap Compression Stockings Facilities manager) Signed: 09/18/2021 4:44:26 PM By: [name] RN Signed: 09/18/2021 6:35:30 PM By: Shawn Stall RN, BSN Entered By: [name] on 09/18/2021 14:11:39 -------------------------------------------------------------------------------- Wound Assessment Details Patient Name: Date of Service: [name] 09/18/2021 1:30 PM Medical Record Number: [id number] Patient Account Number: [id number] Date of Birth/Sex: Treating RN: 05/27/1965 (56 y.o. Debara Pickett, Millard.Loa Primary Care Heidi Lemay: Lianne Moris HN Other Clinician: Referring Aarica Wax: Treating July Nickson/Extender: Junita Push, JO HN Weeks in Treatment: 21 Wound Status Wound Number: 6 Primary Lymphedema Etiology: Wound Location: Left, Lateral Lower Leg Wound Open Wounding Event: Gradually Appeared Status: Date Acquired: 07/26/2021 Comorbid Lymphedema,  Asthma, Hypertension, Peripheral Venous Weeks Of Treatment: 7 History: Disease, Osteoarthritis Clustered Wound: No Photos Wound Measurements Length: (cm) 0.4 Width: (cm) 0.5 Depth: (cm) 0.1 Area: (cm) 0.157 Volume: (cm) 0.016 % Reduction in Area: 42.9% % Reduction in Volume: 85.5% Epithelialization: Medium (34-66%) Tunneling: No Undermining: No Wound Description Classification: Full Thickness Without Exposed Support Structures Wound Margin: Distinct, outline attached Exudate Amount: Medium Exudate Type: Serosanguineous Exudate Color: red, brown Foul Odor After Cleansing: No Slough/Fibrino Yes Wound Bed Granulation Amount: Small (1-33%) Exposed Structure Granulation Quality: Red, Pink Fascia Exposed: No Necrotic Amount: Large (67-100%) Fat Layer (Subcutaneous Tissue) Exposed: Yes Necrotic Quality: Adherent Slough Tendon Exposed: No Muscle Exposed: No Joint Exposed: No Bone Exposed: No Treatment Notes Wound #6 (Lower Leg) Wound Laterality: Left, Lateral Cleanser Wound Cleanser Discharge Instruction: Cleanse the wound with wound cleanser prior to applying a clean dressing using gauze sponges, not tissue or cotton balls. Peri-Wound Care Zinc Oxide Ointment 30g tube Discharge Instruction: Apply Zinc Oxide to periwound with each dressing change Sween Lotion (Moisturizing lotion) Discharge Instruction: Apply moisturizing lotion as directed Topical Primary Dressing IODOFLEX 0.9% Cadexomer Iodine Pad 4x6 cm Discharge Instruction: Apply to wound bed as instructed Secondary Dressing ABD Pad, 5x9 Discharge Instruction: Apply over primary dressing as directed. Woven Gauze Sponge, Non-Sterile 4x4 in Discharge Instruction: Apply over primary dressing as directed. bandaid Discharge Instruction: cover the primary dressing under the abd pad and kerlix. Secured With Compression Wrap Compression Stockings Facilities manager) Signed: 09/18/2021 4:44:26 PM By:  [name] RN Signed: 09/18/2021 6:35:30 PM By: Shawn Stall RN, BSN Entered By: [name] on 09/18/2021 14:11:53 -------------------------------------------------------------------------------- Wound Assessment Details Patient Name: Date of Service: [name] 09/18/2021 1:30 PM Medical Record Number: [id number] Patient Account Number: [id number] Date of Birth/Sex: Treating RN: 1965/05/19 (56 y.o. [name] Primary Care Red Mandt: Lianne Moris HN Other Clinician: Referring Decoda Van: Treating Tracey Stewart/Extender: Junita Push, JO HN Weeks in Treatment: 21 Wound Status Wound Number: 7 Primary Lymphedema Etiology: Wound Location: Right, Proximal, Posterior Lower Leg Wound Healed -  Epithelialized Wounding Event: Gradually Appeared Status: Date Acquired: 08/16/2021 Comorbid Lymphedema, Asthma, Hypertension, Peripheral Venous Weeks Of Treatment: 4 History: Disease, Osteoarthritis Clustered Wound: No Photos Wound Measurements Length: (cm) Width: (cm) Depth: (cm) Area: (cm) Volume: (cm) 0 % Reduction in Area: 100% 0 % Reduction in Volume: 100% 0 Epithelialization: Large (67-100%) 0 Tunneling: No 0 Undermining: No Wound Description Classification: Full Thickness Without Exposed Support Structures Wound Margin: Distinct, outline attached Exudate Amount: None Present Foul Odor After Cleansing: No Slough/Fibrino No Wound Bed Granulation Amount: None Present (0%) Exposed Structure Necrotic Amount: None Present (0%) Fascia Exposed: No Fat Layer (Subcutaneous Tissue) Exposed: No Tendon Exposed: No Muscle Exposed: No Joint Exposed: No Bone Exposed: No Electronic Signature(s) Signed: 09/18/2021 4:44:26 PM By: [name] RN Signed: 09/18/2021 6:35:30 PM By: Shawn Stall RN, BSN Entered By: [name] on 09/18/2021 14:12:11 -------------------------------------------------------------------------------- Vitals  Details Patient Name: Date of Service: [name] 09/18/2021 1:30 PM Medical Record Number: [id number] Patient Account Number: [id number] Date of Birth/Sex: Treating RN: 1965-12-24 (56 y.o. Debara Pickett, Millard.Loa Primary Care Demorio Seeley: Lianne Moris HN Other Clinician: Referring Attilio Zeitler: Treating Shantanique Hodo/Extender: Aubery Lapping HN Weeks in Treatment: 21 Vital Signs Time Taken: 14:00 Temperature (F): 98.5 Height (in): 68 Pulse (bpm): 86 Weight (lbs): 430 Respiratory Rate (breaths/min): 22 Body Mass Index (BMI): 65.4 Blood Pressure (mmHg): 173/94 Reference Range: 80 - 120 mg / dl Electronic Signature(s) Signed: 09/18/2021 6:35:30 PM By: Shawn Stall RN, BSN Entered By: Shawn Stall on 09/18/2021 14:04:12

## 2021-09-19 NOTE — Progress Notes (Signed)
CLARIS, PECH ([id number]) Visit Report for 09/18/2021 Chief Complaint Document Details Patient Name: Date of Service: Catherine Powell 09/18/2021 1:30 PM Medical Record Number: [id number] Patient Account Number: [id number] Date of Birth/Sex: Treating RN: 05-17-1965 (56 y.o. [name] Primary Care Provider: Lianne Moris HN Other Clinician: Referring Provider: Treating Provider/Extender: Aubery Lapping HN Weeks in Treatment: 21 Information Obtained from: Patient Chief Complaint Right posterior leg wound Electronic Signature(s) Signed: 09/19/2021 11:30:51 AM By: Geralyn Corwin DO Entered By: Geralyn Corwin on 09/18/2021 16:12:48 -------------------------------------------------------------------------------- Debridement Details Patient Name: Date of Service: Catherine Hock H. 09/18/2021 1:30 PM Medical Record Number: [id number] Patient Account Number: [id number] Date of Birth/Sex: Treating RN: [birth year] (56 y.o. Debara Pickett, Millard.Loa Primary Care Provider: Lianne Moris HN Other Clinician: Referring Provider: Treating Provider/Extender: Aubery Lapping HN Weeks in Treatment: 21 Debridement Performed for Assessment: Wound #6 Left,Lateral Lower Leg Performed By: Physician Geralyn Corwin, DO Debridement Type: Debridement Level of Consciousness (Pre-procedure): Awake and Alert Pre-procedure Verification/Time Out Yes - 14:20 Taken: Start Time: 14:21 Pain Control: Lidocaine 5% topical ointment T Area Debrided (L x W): otal 0.4 (cm) x 0.5 (cm) = 0.2 (cm) Tissue and other material debrided: Viable, Non-Viable, Slough, Subcutaneous, Slough Level: Skin/Subcutaneous Tissue Debridement Description: Excisional Instrument: Curette Bleeding: Minimum Hemostasis Achieved: Pressure End Time: 14:25 Procedural Pain: 0 Post Procedural Pain: 0 Response to Treatment: Procedure was tolerated well Level of Consciousness (Post- Awake and  Alert procedure): Post Debridement Measurements of Total Wound Length: (cm) 0.4 Width: (cm) 0.5 Depth: (cm) 0.1 Volume: (cm) 0.016 Character of Wound/Ulcer Post Debridement: Improved Post Procedure Diagnosis Same as Pre-procedure Electronic Signature(s) Signed: 09/18/2021 6:35:30 PM By: Shawn Stall RN, BSN Signed: 09/19/2021 11:30:51 AM By: Geralyn Corwin DO Entered By: Shawn Stall on 09/18/2021 14:25:55 -------------------------------------------------------------------------------- HPI Details Patient Name: Date of Service: Catherine Hock H. 09/18/2021 1:30 PM Medical Record Number: [id number] Patient Account Number: [id number] Date of Birth/Sex: Treating RN: 13-Jan-1966 (56 y.o. [name] Primary Care Provider: Lianne Moris HN Other Clinician: Referring Provider: Treating Provider/Extender: Aubery Lapping HN Weeks in Treatment: 21 History of Present Illness HPI Description: Admission 04/20/2021 Catherine Powell is a 56 year old female with a past medical history of morbid obesity with BMI of 66 and wheelchair dependent due to this, lymphedema and hypertension that presents to the clinic for a 33-month history of nonhealing ulcer to the right calf. She states this started spontaneously and has been using Unna boots and Xeroform with benefit She saw vein and vascular who recommended bariatric surgery. She currently denies systemic signs of infection. 2/17; patient presents for follow-up. She tolerated the compression wrap well. She does report increased tenderness to the surrounding wound bed. 2/24; patient presents for follow-up. She reports taking Keflex with improvement in her symptoms. She has developed some vaginal itching and reports a history of yeast infection after taking antibiotics. She has her juxta lite compression today. 3/27; patient presents for follow-up. Patient states that she has had 2 areas open up to the right lower extremity over  the past week. She was started on Keflex by her primary care physician for potential cellulitis. She has been using her juxta light compression daily without issues. She also has a compression stocking to the left lower extremity that she has been using since discharge from clinic on 2/24. 4/4; patient presents for follow-up. She reports that the compression wrap placed in clinic last visit slid  down the following day. She has been using silver alginate to the wound site and using her juxta lite compression daily. She has no issues or complaints today. She would like a note to be out of work so that she can elevate her legs to help heal the wounds. 4/11; the patient arrives today with the area on her right posterior calf totally closed. She has severe lymphedema but has juxta lite stockings and new compression pumps arrive this morning she has not yet used them. 4/26; patient presents with reopening of her previous wounds on the right posterior calf Over the past week. She states she has been using her juxta lite compressions and lymphedema pumps. She is not on a diuretic. 5/18; patient presents for follow-up. She continues to have a wound to the right posterior calf and has developed new wounds to the left lower extremity. She states these happened spontaneously over the past week. She has a juxta light compression however has not been wearing this to the left lower extremity. She has been wearing her juxta lite to the right lower extremity. She denies signs of infection. 5/25; patient presents for follow-up. The compression wrap slid off 1 to 2 days after it was placed in office last week. She has been using silver alginate daily to the wound beds. She has also been wearing her juxta light compression. She currently denies signs of infection. 6/8; patient with bilateral lymphedema wounds on the right greater than left posterior calf. In fact the areas on the left are close to being closed. There is  some degree of venous inflammation and skin changes from chronic edema. She is using Iodoflex to the wounds She has pumps at home that she is using twice a day. She has an appointment to make custom made stockings. Apparently the juxta lites are not staying on very well and even our compression wraps do not stay up for the patient 6/27; patient presents for follow-up. She has been using Iodosorb to the wound beds. Along with her juxta lite compressions daily. She states she has been using her lymphedema pumps 3 times daily as well. Unfortunately she does not currently have health insurance. She has not been able to get her compression garments fitted due to this reason. She currently wants to remain out of work to help these wounds heal. She has no issues or complaints today. 7/11; patient presents for follow-up. She has been using Iodosorb to the wound beds. She uses her juxta lite compressions daily. One of the wounds on the right posterior leg is healed. She would like to return back to work. At this time she does not want to obtain her custom compression garments due to financial reasons. Electronic Signature(s) Signed: 09/19/2021 11:30:51 AM By: Geralyn Corwin DO Entered By: Geralyn Corwin on 09/18/2021 16:13:28 -------------------------------------------------------------------------------- Physical Exam Details Patient Name: Date of Service: Catherine Hock H. 09/18/2021 1:30 PM Medical Record Number: [id number] Patient Account Number: [id number] Date of Birth/Sex: Treating RN: 06-May-1965 (56 y.o. [name] Primary Care Provider: Lianne Moris HN Other Clinician: Referring Provider: Treating Provider/Extender: Junita Push, JO HN Weeks in Treatment: 21 Constitutional respirations regular, non-labored and within target range for patient.. Cardiovascular 2+ dorsalis pedis/posterior tibialis pulses. Psychiatric pleasant and cooperative. Notes Right lower leg:  T the posterior aspect there is epithelization to the more proximal wound site. The remaining wound has granulation tissue throughout. o Left lower extremity: T the posterior aspect there is an open wound  with nonviable tissue. Postdebridement there is good granulation tissue present. Skin o changes consistent with chronic lymphedema and venous stasis. No surrounding signs of infection. Electronic Signature(s) Signed: 09/19/2021 11:30:51 AM By: Geralyn Corwin DO Entered By: Geralyn Corwin on 09/18/2021 16:14:14 -------------------------------------------------------------------------------- Physician Orders Details Patient Name: Date of Service: Catherine Hock H. 09/18/2021 1:30 PM Medical Record Number: [id number] Patient Account Number: [id number] Date of Birth/Sex: Treating RN: 03-15-1965 (56 y.o. Debara Pickett, [place]ard.Loa Primary Care Provider: Lianne Moris HN Other Clinician: Referring Provider: Treating Provider/Extender: Aubery Lapping HN Weeks in Treatment: 84 Verbal / Phone Orders: No Diagnosis Coding ICD-10 Coding Code Description [phone] Non-pressure chronic ulcer of other part of right lower leg with fat layer exposed L97.822 Non-pressure chronic ulcer of other part of left lower leg with fat layer exposed I87.313 Chronic venous hypertension (idiopathic) with ulcer of bilateral lower extremity I89.0 Lymphedema, not elsewhere classified Z99.3 Dependence on wheelchair E66.01 Morbid (severe) obesity due to excess calories Follow-up Appointments ppointment in 2 weeks. - Dr. Mikey Bussing and [place], Room 8 10/02/2021 130pm. Return A Other: - Patient to return to work on 09/25/2021 Tuesday part time 3-4 times a week x2 week and then full time after that. Keep scheduled appointment with [facility] for compression stockings at [address] [place]. ***Once you get the compression stockings wear in place of the juxtalite HD.*** Bathing/ Shower/ Hygiene May shower and  wash wound with soap and water. Edema Control - Lymphedema / SCD / Other Lymphedema Pumps. Use Lymphedema pumps on leg(s) 2-3 times a day for 45-60 minutes. If wearing any wraps or hose, do not remove them. Continue exercising as instructed. - 3 times a day for an hour each time lymphedema pumps. Elevate legs to the level of the heart or above for 30 minutes daily and/or when sitting, a frequency of: - throughout the day. Avoid standing for long periods of time. Exercise regularly Moisturize legs daily. - apply left leg every night before bed. Compression stocking or Garment 20-30 mm/Hg pressure to: - Apply juxtalite HD in the morning and remove at night to both legs. Wound Treatment Wound #4 - Lower Leg Wound Laterality: Right, Posterior Cleanser: Wound Cleanser (Generic) Every Other Day/15 Days Discharge Instructions: Cleanse the wound with wound cleanser prior to applying a clean dressing using gauze sponges, not tissue or cotton balls. Peri-Wound Care: Zinc Oxide Ointment 30g tube Every Other Day/15 Days Discharge Instructions: Apply Zinc Oxide to periwound with each dressing change Peri-Wound Care: Sween Lotion (Moisturizing lotion) Every Other Day/15 Days Discharge Instructions: Apply moisturizing lotion as directed Prim Dressing: IODOFLEX 0.9% Cadexomer Iodine Pad 4x6 cm Every Other Day/15 Days ary Discharge Instructions: Apply to wound bed as instructed Secondary Dressing: ABD Pad, 5x9 Every Other Day/15 Days Discharge Instructions: Apply over primary dressing as directed. Secondary Dressing: Woven Gauze Sponge, Non-Sterile 4x4 in (Generic) Every Other Day/15 Days Discharge Instructions: Apply over primary dressing as directed. Secondary Dressing: bandaid Every Other Day/15 Days Discharge Instructions: cover the primary dressing under the abd pad and kerlix. Wound #6 - Lower Leg Wound Laterality: Left, Lateral Cleanser: Wound Cleanser (Generic) Every Other Day/15 Days Discharge  Instructions: Cleanse the wound with wound cleanser prior to applying a clean dressing using gauze sponges, not tissue or cotton balls. Peri-Wound Care: Zinc Oxide Ointment 30g tube Every Other Day/15 Days Discharge Instructions: Apply Zinc Oxide to periwound with each dressing change Peri-Wound Care: Sween Lotion (Moisturizing lotion) Every Other Day/15 Days Discharge Instructions: Apply moisturizing  lotion as directed Prim Dressing: IODOFLEX 0.9% Cadexomer Iodine Pad 4x6 cm Every Other Day/15 Days ary Discharge Instructions: Apply to wound bed as instructed Secondary Dressing: ABD Pad, 5x9 Every Other Day/15 Days Discharge Instructions: Apply over primary dressing as directed. Secondary Dressing: Woven Gauze Sponge, Non-Sterile 4x4 in (Generic) Every Other Day/15 Days Discharge Instructions: Apply over primary dressing as directed. Secondary Dressing: bandaid Every Other Day/15 Days Discharge Instructions: cover the primary dressing under the abd pad and kerlix. Electronic Signature(s) Signed: 09/19/2021 11:30:51 AM By: Geralyn Corwin DO Entered By: Geralyn Corwin on 09/18/2021 16:14:20 -------------------------------------------------------------------------------- Problem List Details Patient Name: Date of Service: Catherine Hock H. 09/18/2021 1:30 PM Medical Record Number: [id number] Patient Account Number: [id number] Date of Birth/Sex: Treating RN: 1965/09/02 (56 y.o. Debara Pickett, [place]ard.Loa Primary Care Provider: Lianne Moris HN Other Clinician: Referring Provider: Treating Provider/Extender: Aubery Lapping HN Weeks in Treatment: 21 Active Problems ICD-10 Encounter Code Description Active Date MDM Diagnosis L97.812 Non-pressure chronic ulcer of other part of right lower leg with fat layer 04/20/2021 No Yes exposed L97.822 Non-pressure chronic ulcer of other part of left lower leg with fat layer exposed5/18/2023 No Yes I87.313 Chronic venous hypertension  (idiopathic) with ulcer of bilateral lower extremity 08/02/2021 No Yes I89.0 Lymphedema, not elsewhere classified 04/20/2021 No Yes Z99.3 Dependence on wheelchair 04/20/2021 No Yes E66.01 Morbid (severe) obesity due to excess calories 04/20/2021 No Yes Inactive Problems Resolved Problems ICD-10 Code Description Active Date Resolved Date B37.31 Acute candidiasis of vulva and vagina 05/04/2021 05/04/2021 Electronic Signature(s) Signed: 09/19/2021 11:30:51 AM By: Geralyn Corwin DO Entered By: Geralyn Corwin on 09/18/2021 16:12:37 -------------------------------------------------------------------------------- Progress Note Details Patient Name: Date of Service: Catherine Hock H. 09/18/2021 1:30 PM Medical Record Number: [id number] Patient Account Number: [id number] Date of Birth/Sex: Treating RN: [birth year] (56 y.o. [name] Primary Care Provider: Lianne Moris HN Other Clinician: Referring Provider: Treating Provider/Extender: Aubery Lapping HN Weeks in Treatment: 21 Subjective Chief Complaint Information obtained from Patient Right posterior leg wound History of Present Illness (HPI) Admission 04/20/2021 Ms. Leonette Tischer is a 56 year old female with a past medical history of morbid obesity with BMI of 66 and wheelchair dependent due to this, lymphedema and hypertension that presents to the clinic for a 47-month history of nonhealing ulcer to the right calf. She states this started spontaneously and has been using Unna boots and Xeroform with benefit She saw vein and vascular who recommended bariatric surgery. She currently denies systemic signs of infection. 2/17; patient presents for follow-up. She tolerated the compression wrap well. She does report increased tenderness to the surrounding wound bed. 2/24; patient presents for follow-up. She reports taking Keflex with improvement in her symptoms. She has developed some vaginal itching and reports a history  of yeast infection after taking antibiotics. She has her juxta lite compression today. 3/27; patient presents for follow-up. Patient states that she has had 2 areas open up to the right lower extremity over the past week. She was started on Keflex by her primary care physician for potential cellulitis. She has been using her juxta light compression daily without issues. She also has a compression stocking to the left lower extremity that she has been using since discharge from clinic on 2/24. 4/4; patient presents for follow-up. She reports that the compression wrap placed in clinic last visit slid down the following day. She has been using silver alginate to the wound site and using her juxta lite compression daily.  She has no issues or complaints today. She would like a note to be out of work so that she can elevate her legs to help heal the wounds. 4/11; the patient arrives today with the area on her right posterior calf totally closed. She has severe lymphedema but has juxta lite stockings and new compression pumps arrive this morning she has not yet used them. 4/26; patient presents with reopening of her previous wounds on the right posterior calf Over the past week. She states she has been using her juxta lite compressions and lymphedema pumps. She is not on a diuretic. 5/18; patient presents for follow-up. She continues to have a wound to the right posterior calf and has developed new wounds to the left lower extremity. She states these happened spontaneously over the past week. She has a juxta light compression however has not been wearing this to the left lower extremity. She has been wearing her juxta lite to the right lower extremity. She denies signs of infection. 5/25; patient presents for follow-up. The compression wrap slid off 1 to 2 days after it was placed in office last week. She has been using silver alginate daily to the wound beds. She has also been wearing her juxta light  compression. She currently denies signs of infection. 6/8; patient with bilateral lymphedema wounds on the right greater than left posterior calf. In fact the areas on the left are close to being closed. There is some degree of venous inflammation and skin changes from chronic edema. She is using Iodoflex to the wounds She has pumps at home that she is using twice a day. She has an appointment to make custom made stockings. Apparently the juxta lites are not staying on very well and even our compression wraps do not stay up for the patient 6/27; patient presents for follow-up. She has been using Iodosorb to the wound beds. Along with her juxta lite compressions daily. She states she has been using her lymphedema pumps 3 times daily as well. Unfortunately she does not currently have health insurance. She has not been able to get her compression garments fitted due to this reason. She currently wants to remain out of work to help these wounds heal. She has no issues or complaints today. 7/11; patient presents for follow-up. She has been using Iodosorb to the wound beds. She uses her juxta lite compressions daily. One of the wounds on the right posterior leg is healed. She would like to return back to work. At this time she does not want to obtain her custom compression garments due to financial reasons. Patient History Information obtained from Patient. Family History Unknown History. Social History Never smoker, Marital Status - Married, Alcohol Use - Never, Drug Use - No History, Caffeine Use - Daily - coffee. Medical History Hematologic/Lymphatic Patient has history of Lymphedema Respiratory Patient has history of Asthma Cardiovascular Patient has history of Hypertension, Peripheral Venous Disease Musculoskeletal Patient has history of Osteoarthritis Hospitalization/Surgery History - knee right arthoscopy. - appendectomy. - cardiac cath. - cervical fusion. - cholecystectomy. - wisdom  tooth. Medical A Surgical History Notes nd Hematologic/Lymphatic Hypercholesterolemia Gastrointestinal GERD Musculoskeletal Degenerative joint disease of knees Objective Constitutional respirations regular, non-labored and within target range for patient.. Vitals Time Taken: 2:00 PM, Height: 68 in, Weight: 430 lbs, BMI: 65.4, Temperature: 98.5 F, Pulse: 86 bpm, Respiratory Rate: 22 breaths/min, Blood Pressure: 173/94 mmHg. Cardiovascular 2+ dorsalis pedis/posterior tibialis pulses. Psychiatric pleasant and cooperative. General Notes: Right lower leg: T the posterior aspect  there is epithelization to the more proximal wound site. The remaining wound has granulation tissue o throughout. Left lower extremity: T the posterior aspect there is an open wound with nonviable tissue. Postdebridement there is good granulation tissue o present. Skin changes consistent with chronic lymphedema and venous stasis. No surrounding signs of infection. Integumentary (Hair, Skin) Wound #4 status is Open. Original cause of wound was Gradually Appeared. The date acquired was: 07/03/2021. The wound has been in treatment 11 weeks. The wound is located on the Right,Posterior Lower Leg. The wound measures 0.2cm length x 0.2cm width x 0.1cm depth; 0.031cm^2 area and 0.003cm^3 volume. There is Fat Layer (Subcutaneous Tissue) exposed. There is no tunneling or undermining noted. There is a medium amount of serosanguineous drainage noted. The wound margin is distinct with the outline attached to the wound base. There is large (67-100%) red, pink granulation within the wound bed. There is a small (1-33%) amount of necrotic tissue within the wound bed including Adherent Slough. Wound #6 status is Open. Original cause of wound was Gradually Appeared. The date acquired was: 07/26/2021. The wound has been in treatment 7 weeks. The wound is located on the Left,Lateral Lower Leg. The wound measures 0.4cm length x 0.5cm width  x 0.1cm depth; 0.157cm^2 area and 0.016cm^3 volume. There is Fat Layer (Subcutaneous Tissue) exposed. There is no tunneling or undermining noted. There is a medium amount of serosanguineous drainage noted. The wound margin is distinct with the outline attached to the wound base. There is small (1-33%) red, pink granulation within the wound bed. There is a large (67- 100%) amount of necrotic tissue within the wound bed including Adherent Slough. Wound #7 status is Healed - Epithelialized. Original cause of wound was Gradually Appeared. The date acquired was: 08/16/2021. The wound has been in treatment 4 weeks. The wound is located on the Right,Proximal,Posterior Lower Leg. The wound measures 0cm length x 0cm width x 0cm depth; 0cm^2 area and 0cm^3 volume. There is no tunneling or undermining noted. There is a none present amount of drainage noted. The wound margin is distinct with the outline attached to the wound base. There is no granulation within the wound bed. There is no necrotic tissue within the wound bed. Assessment Active Problems ICD-10 Non-pressure chronic ulcer of other part of right lower leg with fat layer exposed Non-pressure chronic ulcer of other part of left lower leg with fat layer exposed Chronic venous hypertension (idiopathic) with ulcer of bilateral lower extremity Lymphedema, not elsewhere classified Dependence on wheelchair Morbid (severe) obesity due to excess calories Patient's wounds have shown improvement in size and appearance since last clinic visit. She has 2 remaining wounds. I debrided nonviable tissue. I recommended continuing Iodosorb and juxta lite compressions daily. She would like to return to work and we gave her a note to do so. Procedures Wound #6 Pre-procedure diagnosis of Wound #6 is a Lymphedema located on the Left,Lateral Lower Leg . There was a Excisional Skin/Subcutaneous Tissue Debridement with a total area of 0.2 sq cm performed by Geralyn Corwin, DO. With the following instrument(s): Curette to remove Viable and Non-Viable tissue/material. Material removed includes Subcutaneous Tissue and Slough and after achieving pain control using Lidocaine 5% topical ointment. A time out was conducted at 14:20, prior to the start of the procedure. A Minimum amount of bleeding was controlled with Pressure. The procedure was tolerated well with a pain level of 0 throughout and a pain level of 0 following the procedure. Post Debridement Measurements:  0.4cm length x 0.5cm width x 0.1cm depth; 0.016cm^3 volume. Character of Wound/Ulcer Post Debridement is improved. Post procedure Diagnosis Wound #6: Same as Pre-Procedure Plan Follow-up Appointments: Return Appointment in 2 weeks. - Dr. Mikey BussingHoffman and [place], Room 8 10/02/2021 130pm. Other: - Patient to return to work on 09/25/2021 Tuesday part time 3-4 times a week x2 week and then full time after that. Keep scheduled appointment with [facility] for compression stockings at [address] [place] ***Once you get the compression stockings wear in place of the juxtalite HD.*** Bathing/ Shower/ Hygiene: May shower and wash wound with soap and water. Edema Control - Lymphedema / SCD / Other: Lymphedema Pumps. Use Lymphedema pumps on leg(s) 2-3 times a day for 45-60 minutes. If wearing any wraps or hose, do not remove them. Continue exercising as instructed. - 3 times a day for an hour each time lymphedema pumps. Elevate legs to the level of the heart or above for 30 minutes daily and/or when sitting, a frequency of: - throughout the day. Avoid standing for long periods of time. Exercise regularly Moisturize legs daily. - apply left leg every night before bed. Compression stocking or Garment 20-30 mm/Hg pressure to: - Apply juxtalite HD in the morning and remove at night to both legs. WOUND #4: - Lower Leg Wound Laterality: Right, Posterior Cleanser: Wound Cleanser (Generic) Every Other Day/15  Days Discharge Instructions: Cleanse the wound with wound cleanser prior to applying a clean dressing using gauze sponges, not tissue or cotton balls. Peri-Wound Care: Zinc Oxide Ointment 30g tube Every Other Day/15 Days Discharge Instructions: Apply Zinc Oxide to periwound with each dressing change Peri-Wound Care: Sween Lotion (Moisturizing lotion) Every Other Day/15 Days Discharge Instructions: Apply moisturizing lotion as directed Prim Dressing: IODOFLEX 0.9% Cadexomer Iodine Pad 4x6 cm Every Other Day/15 Days ary Discharge Instructions: Apply to wound bed as instructed Secondary Dressing: ABD Pad, 5x9 Every Other Day/15 Days Discharge Instructions: Apply over primary dressing as directed. Secondary Dressing: Woven Gauze Sponge, Non-Sterile 4x4 in (Generic) Every Other Day/15 Days Discharge Instructions: Apply over primary dressing as directed. Secondary Dressing: bandaid Every Other Day/15 Days Discharge Instructions: cover the primary dressing under the abd pad and kerlix. WOUND #6: - Lower Leg Wound Laterality: Left, Lateral Cleanser: Wound Cleanser (Generic) Every Other Day/15 Days Discharge Instructions: Cleanse the wound with wound cleanser prior to applying a clean dressing using gauze sponges, not tissue or cotton balls. Peri-Wound Care: Zinc Oxide Ointment 30g tube Every Other Day/15 Days Discharge Instructions: Apply Zinc Oxide to periwound with each dressing change Peri-Wound Care: Sween Lotion (Moisturizing lotion) Every Other Day/15 Days Discharge Instructions: Apply moisturizing lotion as directed Prim Dressing: IODOFLEX 0.9% Cadexomer Iodine Pad 4x6 cm Every Other Day/15 Days ary Discharge Instructions: Apply to wound bed as instructed Secondary Dressing: ABD Pad, 5x9 Every Other Day/15 Days Discharge Instructions: Apply over primary dressing as directed. Secondary Dressing: Woven Gauze Sponge, Non-Sterile 4x4 in (Generic) Every Other Day/15 Days Discharge Instructions:  Apply over primary dressing as directed. Secondary Dressing: bandaid Every Other Day/15 Days Discharge Instructions: cover the primary dressing under the abd pad and kerlix. 1. In office sharp debridement 2. Iodosorb gel 3. Juxta light compression daily 4. Follow-up in 2 weeks Electronic Signature(s) Signed: 09/19/2021 11:30:51 AM By: Geralyn CorwinHoffman, Jonnatan Hanners DO Entered By: Geralyn CorwinHoffman, Diontre Harps on 09/18/2021 16:15:00 -------------------------------------------------------------------------------- HxROS Details Patient Name: Date of Service: Catherine HockBRIGMA N, [name] 09/18/2021 1:30 PM Medical Record Number: [id number] Patient Account Number: [id number] Date of Birth/Sex: Treating RN: 07/08/1965 (  56 y.o. [name] Primary Care Provider: Lianne Moris HN Other Clinician: Referring Provider: Treating Provider/Extender: Aubery Lapping HN Weeks in Treatment: 21 Information Obtained From Patient Hematologic/Lymphatic Medical History: Positive for: Lymphedema Past Medical History Notes: Hypercholesterolemia Respiratory Medical History: Positive for: Asthma Cardiovascular Medical History: Positive for: Hypertension; Peripheral Venous Disease Gastrointestinal Medical History: Past Medical History Notes: GERD Musculoskeletal Medical History: Positive for: Osteoarthritis Past Medical History Notes: Degenerative joint disease of knees Immunizations Pneumococcal Vaccine: Received Pneumococcal Vaccination: No Implantable Devices No devices added Hospitalization / Surgery History Type of Hospitalization/Surgery knee right arthoscopy appendectomy cardiac cath cervical fusion cholecystectomy wisdom tooth Family and Social History Unknown History: Yes; Never smoker; Marital Status - Married; Alcohol Use: Never; Drug Use: No History; Caffeine Use: Daily - coffee; Financial Concerns: No; Food, Clothing or Shelter Needs: No; Support System Lacking: No; Transportation Concerns:  No Electronic Signature(s) Signed: 09/18/2021 6:35:30 PM By: Shawn Stall RN, BSN Signed: 09/19/2021 11:30:51 AM By: Geralyn Corwin DO Entered By: Geralyn Corwin on 09/18/2021 16:13:32 -------------------------------------------------------------------------------- SuperBill Details Patient Name: Date of Service: Catherine Powell 09/18/2021 Medical Record Number: [id number] Patient Account Number: [id number] Date of Birth/Sex: Treating RN: May 30, 1965 (56 y.o. 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

## 2021-10-02 ENCOUNTER — Other Ambulatory Visit: Payer: Self-pay

## 2021-10-02 ENCOUNTER — Encounter (HOSPITAL_BASED_OUTPATIENT_CLINIC_OR_DEPARTMENT_OTHER): Payer: Self-pay | Admitting: Internal Medicine

## 2021-10-02 DIAGNOSIS — L97812 Non-pressure chronic ulcer of other part of right lower leg with fat layer exposed: Secondary | ICD-10-CM

## 2021-10-02 DIAGNOSIS — I890 Lymphedema, not elsewhere classified: Secondary | ICD-10-CM

## 2021-10-02 DIAGNOSIS — I87313 Chronic venous hypertension (idiopathic) with ulcer of bilateral lower extremity: Secondary | ICD-10-CM

## 2021-10-02 DIAGNOSIS — L97822 Non-pressure chronic ulcer of other part of left lower leg with fat layer exposed: Secondary | ICD-10-CM

## 2021-10-02 MED ORDER — HYDROCODONE-ACETAMINOPHEN 5-325 MG PO TABS: 1.0000 | ORAL_TABLET | Freq: Two times a day (BID) | ORAL | 0 refills | Status: DC | PRN

## 2021-10-02 NOTE — Progress Notes (Signed)
Catherine, Powell ([id number]) Visit Report for 10/02/2021 Chief Complaint Document Details Patient Name: Date of Service: Catherine Powell 10/02/2021 1:30 PM Medical Record Number: [id number] Patient Account Number: [id number] Date of Birth/Sex: Treating RN: 1965-03-23 (56 y.o. Catherine Powell Primary Care Provider: Lianne Moris HN Other Clinician: Referring Provider: Treating Provider/Extender: Aubery Lapping HN Weeks in Treatment: 82 Information Obtained from: Patient Chief Complaint Right posterior leg wound Electronic Signature(s) Signed: 10/02/2021 3:31:45 PM By: Geralyn Corwin DO Entered By: Geralyn Corwin on 10/02/2021 14:08:25 -------------------------------------------------------------------------------- HPI Details Patient Name: Date of Service: Catherine Powell. 10/02/2021 1:30 PM Medical Record Number: [id number] Patient Account Number: [id number] Date of Birth/Sex: Treating RN: 05/19/1965 (56 y.o. Catherine Powell Primary Care Provider: Lianne Moris HN Other Clinician: Referring Provider: Treating Provider/Extender: Aubery Lapping HN Weeks in Treatment: 45 History of Present Illness HPI Description: Admission 04/20/2021 Ms. Catherine Powell is a 56 year old female with a past medical history of morbid obesity with BMI of 66 and wheelchair dependent due to this, lymphedema and hypertension that presents to the clinic for a 59-month history of nonhealing ulcer to the right calf. She states this started spontaneously and has been using Unna boots and Xeroform with benefit She saw vein and vascular who recommended bariatric surgery. She currently denies systemic signs of infection. 2/17; patient presents for follow-up. She tolerated the compression wrap well. She does report increased tenderness to the surrounding wound bed. 2/24; patient presents for follow-up. She reports taking Keflex with improvement in her symptoms. She has  developed some vaginal itching and reports a history of yeast infection after taking antibiotics. She has her juxta lite compression today. 3/27; patient presents for follow-up. Patient states that she has had 2 areas open up to the right lower extremity over the past week. She was started on Keflex by her primary care physician for potential cellulitis. She has been using her juxta light compression daily without issues. She also has a compression stocking to the left lower extremity that she has been using since discharge from clinic on 2/24. 4/4; patient presents for follow-up. She reports that the compression wrap placed in clinic last visit slid down the following day. She has been using silver alginate to the wound site and using her juxta lite compression daily. She has no issues or complaints today. She would like a note to be out of work so that she can elevate her legs to help heal the wounds. 4/11; the patient arrives today with the area on her right posterior calf totally closed. She has severe lymphedema but has juxta lite stockings and new compression pumps arrive this morning she has not yet used them. 4/26; patient presents with reopening of her previous wounds on the right posterior calf Over the past week. She states she has been using her juxta lite compressions and lymphedema pumps. She is not on a diuretic. 5/18; patient presents for follow-up. She continues to have a wound to the right posterior calf and has developed new wounds to the left lower extremity. She states these happened spontaneously over the past week. She has a juxta light compression however has not been wearing this to the left lower extremity. She has been wearing her juxta lite to the right lower extremity. She denies signs of infection. 5/25; patient presents for follow-up. The compression wrap slid off 1 to 2 days after it was placed in office last week. She has been using  silver alginate daily to the wound  beds. She has also been wearing her juxta light compression. She currently denies signs of infection. 6/8; patient with bilateral lymphedema wounds on the right greater than left posterior calf. In fact the areas on the left are close to being closed. There is some degree of venous inflammation and skin changes from chronic edema. She is using Iodoflex to the wounds She has pumps at home that she is using twice a day. She has an appointment to make custom made stockings. Apparently the juxta lites are not staying on very well and even our compression wraps do not stay up for the patient 6/27; patient presents for follow-up. She has been using Iodosorb to the wound beds. Along with her juxta lite compressions daily. She states she has been using her lymphedema pumps 3 times daily as well. Unfortunately she does not currently have health insurance. She has not been able to get her compression garments fitted due to this reason. She currently wants to remain out of work to help these wounds heal. She has no issues or complaints today. 7/11; patient presents for follow-up. She has been using Iodosorb to the wound beds. She uses her juxta lite compressions daily. One of the wounds on the right posterior leg is healed. She would like to return back to work. At this time she does not want to obtain her custom compression garments due to financial reasons. 7/25; patient presents for follow-up. We have been using Iodoflex to the wound beds. Her wounds are healed today. She has her juxta lite compressions with her. She states she is going to try and get her custom compression garments. At this time she can return back to work full-time. Electronic Signature(s) Signed: 10/02/2021 3:31:45 PM By: Geralyn Corwin DO Entered By: Geralyn Corwin on 10/02/2021 14:18:03 -------------------------------------------------------------------------------- Physical Exam Details Patient Name: Date of Service: Catherine Powell 10/02/2021 1:30 PM Medical Record Number: [id number] Patient Account Number: [id number] Date of Birth/Sex: Treating RN: [birth year] (56 y.o. Catherine Powell Primary Care Provider: Lianne Moris HN Other Clinician: Referring Provider: Treating Provider/Extender: Junita Push, JO HN Weeks in Treatment: 66 Constitutional respirations regular, non-labored and within target range for patient.. Cardiovascular 2+ dorsalis pedis/posterior tibialis pulses. Psychiatric pleasant and cooperative. Notes Epithelization to the previous wound sites on the lower extremities bilaterally. Significant bilateral lymphedema. Electronic Signature(s) Signed: 10/02/2021 3:31:45 PM By: Geralyn Corwin DO Entered By: Geralyn Corwin on 10/02/2021 14:19:06 -------------------------------------------------------------------------------- Physician Orders Details Patient Name: Date of Service: Catherine Powell. 10/02/2021 1:30 PM Medical Record Number: [id number] Patient Account Number: [id number] Date of Birth/Sex: Treating RN: [birth year] (56 y.o. Debara Pickett, Millard.Loa Primary Care Provider: Lianne Moris HN Other Clinician: Referring Provider: Treating Provider/Extender: Aubery Lapping HN Weeks in Treatment: 81 Verbal / Phone Orders: No Diagnosis Coding ICD-10 Coding Code Description [phone] Non-pressure chronic ulcer of other part of right lower leg with fat layer exposed L97.822 Non-pressure chronic ulcer of other part of left lower leg with fat layer exposed I87.313 Chronic venous hypertension (idiopathic) with ulcer of bilateral lower extremity I89.0 Lymphedema, not elsewhere classified Z99.3 Dependence on wheelchair E66.01 Morbid (severe) obesity due to excess calories Discharge From [facility]charge from [facility] - Keep scheduled appointment with [facility] for compression stockings at [address] [place]. Get those custom compression  stockings. May return to work August 1,2023 without restrictions. Edema Control - Lymphedema / SCD / Other  Lymphedema Pumps. Use Lymphedema pumps on leg(s) 2-3 times a day for 45-60 minutes. If wearing any wraps or hose, do not remove them. Continue exercising as instructed. - 3 times a day for an hour each time lymphedema pumps. Elevate legs to the level of the heart or above for 30 minutes daily and/or when sitting, a frequency of: - throughout the day. Avoid standing for long periods of time. Exercise regularly Moisturize legs daily. - apply left leg every night before bed. Compression stocking or Garment 30-40 mm/Hg pressure to: - Apply juxtalite HD in the morning and remove at night to both legs. Electronic Signature(s) Signed: 10/02/2021 3:31:45 PM By: Geralyn Corwin DO Entered By: Geralyn Corwin on 10/02/2021 14:19:11 -------------------------------------------------------------------------------- Problem List Details Patient Name: Date of Service: Catherine Powell. 10/02/2021 1:30 PM Medical Record Number: [id number] Patient Account Number: [id number] Date of Birth/Sex: Treating RN: 08/22/1965 (56 y.o. Debara Pickett, Millard.Loa Primary Care Provider: Lianne Moris HN Other Clinician: Referring Provider: Treating Provider/Extender: Aubery Lapping HN Weeks in Treatment: 23 Active Problems ICD-10 Encounter Code Description Active Date MDM Diagnosis L97.812 Non-pressure chronic ulcer of other part of right lower leg with fat layer 04/20/2021 No Yes exposed L97.822 Non-pressure chronic ulcer of other part of left lower leg with fat layer exposed5/18/2023 No Yes I87.313 Chronic venous hypertension (idiopathic) with ulcer of bilateral lower extremity 08/02/2021 No Yes I89.0 Lymphedema, not elsewhere classified 04/20/2021 No Yes Z99.3 Dependence on wheelchair 04/20/2021 No Yes E66.01 Morbid (severe) obesity due to excess calories 04/20/2021 No Yes Inactive  Problems Resolved Problems ICD-10 Code Description Active Date Resolved Date B37.31 Acute candidiasis of vulva and vagina 05/04/2021 05/04/2021 Electronic Signature(s) Signed: 10/02/2021 3:31:45 PM By: Geralyn Corwin DO Entered By: Geralyn Corwin on 10/02/2021 14:08:12 -------------------------------------------------------------------------------- Progress Note Details Patient Name: Date of Service: [name] 10/02/2021 1:30 PM Medical Record Number: [id number] Patient Account Number: [id number] Date of Birth/Sex: Treating RN: [birth year] (56 y.o. Catherine Powell Primary Care Provider: Lianne Moris HN Other Clinician: Referring Provider: Treating Provider/Extender: Aubery Lapping HN Weeks in Treatment: 81 Subjective Chief Complaint Information obtained from Patient Right posterior leg wound History of Present Illness (HPI) Admission 04/20/2021 Ms. Vernecia Umble is a 56 year old female with a past medical history of morbid obesity with BMI of 66 and wheelchair dependent due to this, lymphedema and hypertension that presents to the clinic for a 54-month history of nonhealing ulcer to the right calf. She states this started spontaneously and has been using Unna boots and Xeroform with benefit She saw vein and vascular who recommended bariatric surgery. She currently denies systemic signs of infection. 2/17; patient presents for follow-up. She tolerated the compression wrap well. She does report increased tenderness to the surrounding wound bed. 2/24; patient presents for follow-up. She reports taking Keflex with improvement in her symptoms. She has developed some vaginal itching and reports a history of yeast infection after taking antibiotics. She has her juxta lite compression today. 3/27; patient presents for follow-up. Patient states that she has had 2 areas open up to the right lower extremity over the past week. She was started on Keflex by her primary  care physician for potential cellulitis. She has been using her juxta light compression daily without issues. She also has a compression stocking to the left lower extremity that she has been using since discharge from clinic on 2/24. 4/4; patient presents for follow-up. She reports that the compression wrap placed in clinic  last visit slid down the following day. She has been using silver alginate to the wound site and using her juxta lite compression daily. She has no issues or complaints today. She would like a note to be out of work so that she can elevate her legs to help heal the wounds. 4/11; the patient arrives today with the area on her right posterior calf totally closed. She has severe lymphedema but has juxta lite stockings and new compression pumps arrive this morning she has not yet used them. 4/26; patient presents with reopening of her previous wounds on the right posterior calf Over the past week. She states she has been using her juxta lite compressions and lymphedema pumps. She is not on a diuretic. 5/18; patient presents for follow-up. She continues to have a wound to the right posterior calf and has developed new wounds to the left lower extremity. She states these happened spontaneously over the past week. She has a juxta light compression however has not been wearing this to the left lower extremity. She has been wearing her juxta lite to the right lower extremity. She denies signs of infection. 5/25; patient presents for follow-up. The compression wrap slid off 1 to 2 days after it was placed in office last week. She has been using silver alginate daily to the wound beds. She has also been wearing her juxta light compression. She currently denies signs of infection. 6/8; patient with bilateral lymphedema wounds on the right greater than left posterior calf. In fact the areas on the left are close to being closed. There is some degree of venous inflammation and skin changes from  chronic edema. She is using Iodoflex to the wounds She has pumps at home that she is using twice a day. She has an appointment to make custom made stockings. Apparently the juxta lites are not staying on very well and even our compression wraps do not stay up for the patient 6/27; patient presents for follow-up. She has been using Iodosorb to the wound beds. Along with her juxta lite compressions daily. She states she has been using her lymphedema pumps 3 times daily as well. Unfortunately she does not currently have health insurance. She has not been able to get her compression garments fitted due to this reason. She currently wants to remain out of work to help these wounds heal. She has no issues or complaints today. 7/11; patient presents for follow-up. She has been using Iodosorb to the wound beds. She uses her juxta lite compressions daily. One of the wounds on the right posterior leg is healed. She would like to return back to work. At this time she does not want to obtain her custom compression garments due to financial reasons. 7/25; patient presents for follow-up. We have been using Iodoflex to the wound beds. Her wounds are healed today. She has her juxta lite compressions with her. She states she is going to try and get her custom compression garments. At this time she can return back to work full-time. Patient History Information obtained from Patient. Family History Unknown History. Social History Never smoker, Marital Status - Married, Alcohol Use - Never, Drug Use - No History, Caffeine Use - Daily - coffee. Medical History Hematologic/Lymphatic Patient has history of Lymphedema Respiratory Patient has history of Asthma Cardiovascular Patient has history of Hypertension, Peripheral Venous Disease Musculoskeletal Patient has history of Osteoarthritis Hospitalization/Surgery History - knee right arthoscopy. - appendectomy. - cardiac cath. - cervical fusion. - cholecystectomy.  - wisdom  tooth. Medical A Surgical History Notes nd Hematologic/Lymphatic Hypercholesterolemia Gastrointestinal GERD Musculoskeletal Degenerative joint disease of knees Objective Constitutional respirations regular, non-labored and within target range for patient.. Vitals Time Taken: 1:50 PM, Height: 68 in, Weight: 430 lbs, BMI: 65.4, Temperature: 98.2 F, Pulse: 76 bpm, Respiratory Rate: 22 breaths/min, Blood Pressure: 158/84 mmHg. Cardiovascular 2+ dorsalis pedis/posterior tibialis pulses. Psychiatric pleasant and cooperative. General Notes: Epithelization to the previous wound sites on the lower extremities bilaterally. Significant bilateral lymphedema. Integumentary (Hair, Skin) Wound #4 status is Open. Original cause of wound was Gradually Appeared. The date acquired was: 07/03/2021. The wound has been in treatment 13 weeks. The wound is located on the Right,Posterior Lower Leg. The wound measures 0cm length x 0cm width x 0cm depth; 0cm^2 area and 0cm^3 volume. There is no tunneling or undermining noted. There is a none present amount of drainage noted. The wound margin is distinct with the outline attached to the wound base. There is no granulation within the wound bed. There is no necrotic tissue within the wound bed. Wound #6 status is Open. Original cause of wound was Gradually Appeared. The date acquired was: 07/26/2021. The wound has been in treatment 9 weeks. The wound is located on the Left,Lateral Lower Leg. The wound measures 0cm length x 0cm width x 0cm depth; 0cm^2 area and 0cm^3 volume. There is no tunneling or undermining noted. There is a none present amount of drainage noted. The wound margin is distinct with the outline attached to the wound base. There is no granulation within the wound bed. There is no necrotic tissue within the wound bed. Assessment Active Problems ICD-10 Non-pressure chronic ulcer of other part of right lower leg with fat layer  exposed Non-pressure chronic ulcer of other part of left lower leg with fat layer exposed Chronic venous hypertension (idiopathic) with ulcer of bilateral lower extremity Lymphedema, not elsewhere classified Dependence on wheelchair Morbid (severe) obesity due to excess calories Patient has done well with Iodoflex. Her wounds have healed. I recommended compression therapy daily. Continue lymphedema pumps. For now she can use her juxta lite compressions until she obtains her custom compression garments. She may follow-up as needed. Plan Discharge From [facility]: Discharge from [facility] Care Center - Keep scheduled appointment with [facility] for compression stockings at [address] [place]. Get those custom compression stockings. May return to work August 1,2023 without restrictions. Edema Control - Lymphedema / SCD / Other: Lymphedema Pumps. Use Lymphedema pumps on leg(s) 2-3 times a day for 45-60 minutes. If wearing any wraps or hose, do not remove them. Continue exercising as instructed. - 3 times a day for an hour each time lymphedema pumps. Elevate legs to the level of the heart or above for 30 minutes daily and/or when sitting, a frequency of: - throughout the day. Avoid standing for long periods of time. Exercise regularly Moisturize legs daily. - apply left leg every night before bed. Compression stocking or Garment 30-40 mm/Hg pressure to: - Apply juxtalite HD in the morning and remove at night to both legs. 1. Compression garments daily 2. Continue lymphedema pumps 3. Can return to work full-time with no restrictions 4. Follow-up as needed 5. Discharge from clinic due to closed wound Electronic Signature(s) Signed: 10/02/2021 3:31:45 PM By: Geralyn Corwin DO Entered By: Geralyn Corwin on 10/02/2021 14:37:45 -------------------------------------------------------------------------------- HxROS Details Patient Name: Date of Service: Catherine Powell. 10/02/2021  1:30 PM Medical Record Number: [id number] Patient Account Number: [id number] Date of Birth/Sex: Treating RN:  01/27/1966 (56 y.o. Catherine SilenceF) [name], [name] Primary Care Provider: Lianne MorisNO RBERT, JO HN Other Clinician: Referring Provider: Treating Provider/Extender: Aubery LappingHoffman, Kao Conry NO RBERT, JO HN Weeks in Treatment: 6823 Information Obtained From Patient Hematologic/Lymphatic Medical History: Positive for: Lymphedema Past Medical History Notes: Hypercholesterolemia Respiratory Medical History: Positive for: Asthma Cardiovascular Medical History: Positive for: Hypertension; Peripheral Venous Disease Gastrointestinal Medical History: Past Medical History Notes: GERD Musculoskeletal Medical History: Positive for: Osteoarthritis Past Medical History Notes: Degenerative joint disease of knees Immunizations Pneumococcal Vaccine: Received Pneumococcal Vaccination: No Implantable Devices No devices added Hospitalization / Surgery History Type of Hospitalization/Surgery knee right arthoscopy appendectomy cardiac cath cervical fusion cholecystectomy wisdom tooth Family and Social History Unknown History: Yes; Never smoker; Marital Status - Married; Alcohol Use: Never; Drug Use: No History; Caffeine Use: Daily - coffee; Financial Concerns: No; Food, Clothing or Shelter Needs: No; Support System Lacking: No; Transportation Concerns: No Electronic Signature(s) Signed: 10/02/2021 3:31:45 PM By: Geralyn CorwinHoffman, Lisamarie Coke DO Signed: 10/02/2021 5:12:34 PM By: Shawn Stalleaton, [name] RN, BSN Entered By: Geralyn CorwinHoffman, Ronal Maybury on 10/02/2021 14:18:21 -------------------------------------------------------------------------------- SuperBill Details Patient Name: Date of Service: Catherine FlakesBRIGMA N, [name] 10/02/2021 Medical Record Number: [id number] Patient Account Number: [id number] Date of Birth/Sex: Treating RN: 10/12/1965 (56 y.o. Debara PickettF) [name], Millard.LoaBobbi Primary Care Provider: Lianne MorisNO RBERT, JO HN Other Clinician: Referring  Provider: Treating Provider/Extender: Aubery LappingHoffman, Devetta Hagenow NO RBERT, JO HN Weeks in Treatment: 23 Diagnosis Coding ICD-10 Codes Code Description [phone] Non-pressure chronic ulcer of other part of right lower leg with fat layer exposed L97.822 Non-pressure chronic ulcer of other part of left lower leg with fat layer exposed I87.313 Chronic venous hypertension (idiopathic) with ulcer of bilateral lower extremity I89.0 Lymphedema, not elsewhere classified Z99.3 Dependence on wheelchair E66.01 Morbid (severe) obesity due to excess calories Facility Procedures CPT4 Code: [id number] Description: 99213 - WOUND CARE VISIT-LEV 3 EST PT Modifier: Quantity: 1 Physician Procedures : CPT4 Code Description Modifier [id number] 99213 - WC PHYS LEVEL 3 - EST PT ICD-10 Diagnosis Description L97.812 Non-pressure chronic ulcer of other part of right lower leg with fat layer exposed L97.822 Non-pressure chronic ulcer of other part of left  lower leg with fat layer exposed I87.313 Chronic venous hypertension (idiopathic) with ulcer of bilateral lower extremity I89.0 Lymphedema, not elsewhere classified Quantity: 1 Electronic Signature(s) Signed: 10/02/2021 3:31:45 PM By: Geralyn CorwinHoffman, Concetta Guion DO Entered By: Geralyn CorwinHoffman, Chucky Homes on 10/02/2021 14:37:59

## 2021-10-02 NOTE — Progress Notes (Signed)
CILICIA, BORDEN ([id number]) Visit Report for 10/02/2021 Arrival Information Details Patient Name: Date of Service: Maudie Flakes 10/02/2021 1:30 PM Medical Record Number: [id number] Patient Account Number: [id number] Date of Birth/Sex: Treating RN: 1965-07-09 (56 y.o. [name] Primary Care Richardson Dubree: Lianne Moris HN Other Clinician: Referring Tula Schryver: Treating Kacia Halley/Extender: Aubery Lapping HN Weeks in Treatment: 72 Visit Information History Since Last Visit Added or deleted any medications: No Patient Arrived: Wheel Chair Any new allergies or adverse reactions: No Arrival Time: 13:50 Had a fall or experienced change in No Accompanied By: son activities of daily living that may affect Transfer Assistance: None risk of falls: Patient Identification Verified: Yes Signs or symptoms of abuse/neglect since last visito No Secondary Verification Process Completed: Yes Hospitalized since last visit: No Patient Requires Transmission-Based Precautions: No Implantable device outside of the clinic excluding No Patient Has Alerts: No cellular tissue based products placed in the center since last visit: Has Dressing in Place as Prescribed: Yes Has Compression in Place as Prescribed: Yes Pain Present Now: Yes Notes per patient job will not let her back to work without a motorized wheelchair. Electronic Signature(s) Signed: 10/02/2021 5:12:34 PM By: Shawn Stall RN, BSN Entered By: Shawn Stall on 10/02/2021 13:57:57 -------------------------------------------------------------------------------- Clinic Level of Care Assessment Details Patient Name: Date of Service: Maudie Flakes 10/02/2021 1:30 PM Medical Record Number: [id number] Patient Account Number: [id number] Date of Birth/Sex: Treating RN: 07-Aug-1965 (56 y.o. Debara Pickett, Millard.Loa Primary Care Zanyla Klebba: Lianne Moris HN Other Clinician: Referring Kenora Spayd: Treating Zipporah Finamore/Extender: Aubery Lapping HN Weeks in Treatment: 23 Clinic Level of Care Assessment Items TOOL 4 Quantity Score X- 1 0 Use when only an EandM is performed on FOLLOW-UP visit ASSESSMENTS - Nursing Assessment / Reassessment X- 1 10 Reassessment of Co-morbidities (includes updates in patient status) X- 1 5 Reassessment of Adherence to Treatment Plan ASSESSMENTS - Wound and Skin A ssessment / Reassessment []  - 0 Simple Wound Assessment / Reassessment - one wound X- 2 5 Complex Wound Assessment / Reassessment - multiple wounds X- 1 10 Dermatologic / Skin Assessment (not related to wound area) ASSESSMENTS - Focused Assessment []  - 0 Circumferential Edema Measurements - multi extremities []  - 0 Nutritional Assessment / Counseling / Intervention []  - 0 Lower Extremity Assessment (monofilament, tuning fork, pulses) []  - 0 Peripheral Arterial Disease Assessment (using hand held doppler) ASSESSMENTS - Ostomy and/or Continence Assessment and Care []  - 0 Incontinence Assessment and Management []  - 0 Ostomy Care Assessment and Management (repouching, etc.) PROCESS - Coordination of Care []  - 0 Simple Patient / Family Education for ongoing care X- 1 20 Complex (extensive) Patient / Family Education for ongoing care X- 1 10 Staff obtains , Records, T Results / Process Orders est []  - 0 Staff telephones HHA, Nursing Homes / Clarify orders / etc []  - 0 Routine Transfer to another Facility (non-emergent condition) []  - 0 Routine Hospital Admission (non-emergent condition) []  - 0 New Admissions / / Ordering NPWT Apligraf, etc. , []  - 0 Emergency Hospital Admission (emergent condition) []  - 0 Simple Discharge Coordination X- 1 15 Complex (extensive) Discharge Coordination PROCESS - Special Needs []  - 0 Pediatric / Minor Patient Management []  - 0 Isolation Patient Management []  - 0 Hearing / Language / Visual special needs []  - 0 Assessment of  Community assistance (transportation, D/C planning, etc.) []  - 0 Additional assistance / Altered mentation []  - 0 Support  Surface(s) Assessment (bed, cushion, seat, etc.) INTERVENTIONS - Wound Cleansing / Measurement []  - 0 Simple Wound Cleansing - one wound X- 2 5 Complex Wound Cleansing - multiple wounds X- 1 5 Wound Imaging (photographs - any number of wounds) []  - 0 Wound Tracing (instead of photographs) []  - 0 Simple Wound Measurement - one wound X- 2 5 Complex Wound Measurement - multiple wounds INTERVENTIONS - Wound Dressings []  - 0 Small Wound Dressing one or multiple wounds []  - 0 Medium Wound Dressing one or multiple wounds []  - 0 Large Wound Dressing one or multiple wounds []  - 0 Application of Medications - topical []  - 0 Application of Medications - injection INTERVENTIONS - Miscellaneous []  - 0 External ear exam []  - 0 Specimen Collection (cultures, biopsies, blood, body fluids, etc.) []  - 0 Specimen(s) / Culture(s) sent or taken to Lab for analysis []  - 0 Patient Transfer (multiple staff / Civil Service fast streamer / Similar devices) []  - 0 Simple Staple / Suture removal (25 or less) []  - 0 Complex Staple / Suture removal (26 or more) []  - 0 Hypo / Hyperglycemic Management (close monitor of Blood Glucose) []  - 0 Ankle / Brachial Index (ABI) - do not check if billed separately X- 1 5 Vital Signs Has the patient been seen at the hospital within the last three years: Yes Total Score: 110 Level Of Care: New/Established - Level 3 Electronic Signature(s) Signed: 10/02/2021 5:12:34 PM By: Deon Pilling RN, BSN Entered By: Deon Pilling on 10/02/2021 14:07:58 -------------------------------------------------------------------------------- Encounter Discharge Information Details Patient Name: Date of Service: [name] 10/02/2021 1:30 PM Medical Record Number: [id number] Patient Account Number: [id number] Date of Birth/Sex: Treating RN: [birth year] (56 y.o.  [name] Primary Care Shakeia Krus: Garner Gavel HN Other Clinician: Referring Concetta Guion: Treating [place]/Extender: Verlin Dike HN Weeks in Treatment: 77 Encounter Discharge Information Items Discharge Condition: Stable Ambulatory Status: Wheelchair Discharge Destination: Home Transportation: Private Auto Accompanied By: son Schedule Follow-up Appointment: No Clinical Summary of Care: Electronic Signature(s) Signed: 10/02/2021 5:12:34 PM By: Deon Pilling RN, BSN Entered By: Deon Pilling on 10/02/2021 14:09:37 -------------------------------------------------------------------------------- Lower Extremity Assessment Details Patient Name: Date of Service: [name] 10/02/2021 1:30 PM Medical Record Number: [id number] Patient Account Number: [id number] Date of Birth/Sex: Treating RN: 12-15-1965 (56 y.o. [name] Primary Care Anjoli Diemer: Garner Gavel HN Other Clinician: Referring Grey Schlauch: Treating Errika Narvaiz/Extender: Rogene Houston, JO HN Weeks in Treatment: 23 Edema Assessment Assessed: [Left: Yes] [Right: Yes] Edema: [Left: Yes] [Right: Yes] Calf Left: Right: Point of Measurement: From Medial Instep 73 cm 70 cm Ankle Left: Right: Point of Measurement: From Medial Instep 38 cm 38 cm Electronic Signature(s) Signed: 10/02/2021 5:12:34 PM By: Deon Pilling RN, BSN Entered By: Deon Pilling on 10/02/2021 13:59:38 -------------------------------------------------------------------------------- Multi Wound Chart Details Patient Name: Date of Service: [name] 10/02/2021 1:30 PM Medical Record Number: [id number] Patient Account Number: [id number] Date of Birth/Sex: Treating RN: 1966-02-17 (56 y.o. [name] Primary Care Mihika Surrette: Garner Gavel HN Other Clinician: Referring Gio Janoski: Treating Theona Muhs/Extender: Verlin Dike HN Weeks in Treatment: 82 Vital Signs Height(in): 68 Pulse(bpm):  67 Weight(lbs): 430 Blood Pressure(mmHg): 158/84 Body Mass Index(BMI): 65.4 Temperature(F): 98.2 Respiratory Rate(breaths/min): 22 Photos: [[name]:[name]] Right, Posterior Lower Leg Left, Lateral Lower Leg [name] Wound Location: Gradually Appeared Gradually Appeared [name] Wounding Event: Venous Leg Ulcer Lymphedema [name] Primary Etiology: Lymphedema, Asthma, Hypertension, Lymphedema, Asthma, Hypertension, [name] Comorbid History: Peripheral Venous  Disease, Peripheral Venous Disease, Osteoarthritis Osteoarthritis 07/03/2021 07/26/2021 [name] Date Acquired: 13 9 [name] Weeks of Treatment: Open Open [name] Wound Status: No No [name] Wound Recurrence: Yes No [name] Clustered Wound: 0x0x0 0x0x0 [name] Measurements L x W x D (cm) 0 0 [name] A (cm) : rea 0 0 [name] Volume (cm) : 100.00% 100.00% [name] % Reduction in Area: 100.00% 100.00% [name] % Reduction in Volume: Full Thickness Without Exposed Full Thickness Without Exposed [name] Classification: Support Structures Support Structures None Present None Present [name] Exudate Amount: Distinct, outline attached Distinct, outline attached [name] Wound Margin: None Present (0%) None Present (0%) [name] Granulation Amount: None Present (0%) None Present (0%) [name] Necrotic Amount: Fascia: No Fascia: No [name] Exposed Structures: Fat Layer (Subcutaneous Tissue): No Fat Layer (Subcutaneous Tissue): No Tendon: No Tendon: No Muscle: No Muscle: No Joint: No Joint: No Bone: No Bone: No Large (67-100%) Large (67-100%) [name] Epithelialization: Treatment Notes Electronic Signature(s) Signed: 10/02/2021 3:31:45 PM By: Geralyn Corwin DO Signed: 10/02/2021 5:12:34 PM By: Shawn Stall RN, BSN Signed: 10/02/2021 5:12:34 PM By: Shawn Stall RN, BSN Entered By: Geralyn Corwin on 10/02/2021 14:08:19 -------------------------------------------------------------------------------- Multi-Disciplinary Care Plan Details Patient Name: Date of Service: [name] 10/02/2021  1:30 PM Medical Record Number: [id number] Patient Account Number: [id number] Date of Birth/Sex: Treating RN: 04/18/1965 (56 y.o. [name] Primary Care Tyreece Gelles: Lianne Moris HN Other Clinician: Referring Insiya Oshea: Treating Yannis Broce/Extender: Aubery Lapping HN Weeks in Treatment: 51 Multidisciplinary Care Plan reviewed with physician Active Inactive Electronic Signature(s) Signed: 10/02/2021 5:12:34 PM By: Shawn Stall RN, BSN Entered By: Shawn Stall on 10/02/2021 14:07:13 -------------------------------------------------------------------------------- Pain Assessment Details Patient Name: Date of Service: Maudie Flakes. 10/02/2021 1:30 PM Medical Record Number: [id number] Patient Account Number: [id number] Date of Birth/Sex: Treating RN: [birth year] (56 y.o. [name] Primary Care Cory Rama: Lianne Moris HN Other Clinician: Referring Janea Schwenn: Treating Luberta Grabinski/Extender: Aubery Lapping HN Weeks in Treatment: 63 Active Problems Location of Pain Severity and Description of Pain Patient Has Paino Yes Site Locations Pain Location: Generalized Pain, Pain in Ulcers Rate the pain. Current Pain Level: 4 Pain Management and Medication Current Pain Management: Medication: No Cold Application: No Rest: No Massage: No Activity: No T.E.N.S.: No Heat Application: No Leg drop or elevation: No Is the Current Pain Management Adequate: Adequate How does your wound impact your activities of daily livingo Sleep: No Bathing: No Appetite: No Relationship With Others: No Bladder Continence: No Emotions: No Bowel Continence: No Work: No Toileting: No Drive: No Dressing: No Hobbies: No Psychologist, prison and probation services) Signed: 10/02/2021 5:12:34 PM By: Shawn Stall RN, BSN Entered By: Shawn Stall on 10/02/2021 13:58:22 -------------------------------------------------------------------------------- Patient/Caregiver Education  Details Patient Name: Date of Service: Maudie Flakes 7/25/2023andnbsp1:30 PM Medical Record Number: [id number] Patient Account Number: [id number] Date of Birth/Gender: Treating RN: 12/05/1965 (56 y.o. [name] Primary Care Physician: Lianne Moris HN Other Clinician: Referring Physician: Treating Physician/Extender: Aubery Lapping HN Weeks in Treatment: 39 Education Assessment Education Provided To: Patient Education Topics Provided Wound/Skin Impairment: Handouts: Skin Care Do's and Dont's Methods: Explain/Verbal Responses: Reinforcements needed Electronic Signature(s) Signed: 10/02/2021 5:12:34 PM By: Shawn Stall RN, BSN Entered By: Shawn Stall on 10/02/2021 14:07:26 -------------------------------------------------------------------------------- Wound Assessment Details Patient Name: Date of Service: Maudie Flakes. 10/02/2021 1:30 PM Medical Record Number: [id number] Patient Account Number: [id number] Date of Birth/Sex: Treating RN: [birth year] (56 y.o. [name] Primary Care Drinda Belgard: NO RBERT,  JO HN Other Clinician: Referring Jimi Giza: Treating Corabelle Spackman/Extender: Rogene Houston, JO HN Weeks in Treatment: 23 Wound Status Wound Number: 4 Primary Venous Leg Ulcer Etiology: Wound Location: Right, Posterior Lower Leg Wound Open Wounding Event: Gradually Appeared Status: Date Acquired: 07/03/2021 Comorbid Lymphedema, Asthma, Hypertension, Peripheral Venous Weeks Of Treatment: 13 History: Disease, Osteoarthritis Clustered Wound: Yes Photos Wound Measurements Length: (cm) Width: (cm) Depth: (cm) Area: (cm) Volume: (cm) 0 % Reduction in Area: 100% 0 % Reduction in Volume: 100% 0 Epithelialization: Large (67-100%) 0 Tunneling: No 0 Undermining: No Wound Description Classification: Full Thickness Without Exposed Support Structures Wound Margin: Distinct, outline attached Exudate Amount: None  Present Foul Odor After Cleansing: No Slough/Fibrino No Wound Bed Granulation Amount: None Present (0%) Exposed Structure Necrotic Amount: None Present (0%) Fascia Exposed: No Fat Layer (Subcutaneous Tissue) Exposed: No Tendon Exposed: No Muscle Exposed: No Joint Exposed: No Bone Exposed: No Electronic Signature(s) Signed: 10/02/2021 5:12:34 PM By: Deon Pilling RN, BSN Entered By: Deon Pilling on 10/02/2021 14:03:25 -------------------------------------------------------------------------------- Wound Assessment Details Patient Name: Date of Service: Creola Corn. 10/02/2021 1:30 PM Medical Record Number: [id number] Patient Account Number: [id number] Date of Birth/Sex: Treating RN: February 27, 1966 (56 y.o. Helene Shoe, Meta.Reding Primary Care Allyana Vogan: Garner Gavel HN Other Clinician: Referring Kerra Guilfoil: Treating Annalina Needles/Extender: Rogene Houston, JO HN Weeks in Treatment: 23 Wound Status Wound Number: 6 Primary Lymphedema Etiology: Wound Location: Left, Lateral Lower Leg Wound Open Wounding Event: Gradually Appeared Status: Date Acquired: 07/26/2021 Comorbid Lymphedema, Asthma, Hypertension, Peripheral Venous Weeks Of Treatment: 9 History: Disease, Osteoarthritis Clustered Wound: No Photos Wound Measurements Length: (cm) Width: (cm) Depth: (cm) Area: (cm) Volume: (cm) 0 % Reduction in Area: 100% 0 % Reduction in Volume: 100% 0 Epithelialization: Large (67-100%) 0 Tunneling: No 0 Undermining: No Wound Description Classification: Full Thickness Without Exposed Support Structures Wound Margin: Distinct, outline attached Exudate Amount: None Present Foul Odor After Cleansing: No Slough/Fibrino Yes Wound Bed Granulation Amount: None Present (0%) Exposed Structure Necrotic Amount: None Present (0%) Fascia Exposed: No Fat Layer (Subcutaneous Tissue) Exposed: No Tendon Exposed: No Muscle Exposed: No Joint Exposed: No Bone Exposed: No Electronic  Signature(s) Signed: 10/02/2021 5:12:34 PM By: Deon Pilling RN, BSN Entered By: Deon Pilling on 10/02/2021 14:02:22 -------------------------------------------------------------------------------- [place] Details Patient Name: Date of Service: [name] 10/02/2021 1:30 PM Medical Record Number: [id number] Patient Account Number: [id number] Date of Birth/Sex: Treating RN: [birth year] (56 y.o. Helene Shoe, Meta.Reding Primary Care Quandra Fedorchak: Garner Gavel HN Other Clinician: Referring Sanjeev Main: Treating Aodhan Scheidt/Extender: Verlin Dike HN Weeks in Treatment: 43 Vital Signs Time Taken: 13:50 Temperature (F): 98.2 Height (in): 68 Pulse (bpm): 76 Weight (lbs): 430 Respiratory Rate (breaths/min): 22 Body Mass Index (BMI): 65.4 Blood Pressure (mmHg): 158/84 Reference Range: 80 - 120 mg / dl Electronic Signature(s) Signed: 10/02/2021 5:12:34 PM By: Deon Pilling RN, BSN Entered By: Deon Pilling on 10/02/2021 13:58:13

## 2021-10-08 ENCOUNTER — Encounter: Payer: Self-pay | Admitting: Student

## 2021-10-15 ENCOUNTER — Encounter (HOSPITAL_BASED_OUTPATIENT_CLINIC_OR_DEPARTMENT_OTHER): Payer: Self-pay | Attending: Internal Medicine | Admitting: Internal Medicine

## 2021-10-15 DIAGNOSIS — L97812 Non-pressure chronic ulcer of other part of right lower leg with fat layer exposed: Secondary | ICD-10-CM | POA: Insufficient documentation

## 2021-10-15 DIAGNOSIS — Z6841 Body Mass Index (BMI) 40.0 and over, adult: Secondary | ICD-10-CM | POA: Insufficient documentation

## 2021-10-15 DIAGNOSIS — L97822 Non-pressure chronic ulcer of other part of left lower leg with fat layer exposed: Secondary | ICD-10-CM | POA: Insufficient documentation

## 2021-10-15 DIAGNOSIS — Z993 Dependence on wheelchair: Secondary | ICD-10-CM | POA: Insufficient documentation

## 2021-10-15 DIAGNOSIS — I87333 Chronic venous hypertension (idiopathic) with ulcer and inflammation of bilateral lower extremity: Secondary | ICD-10-CM | POA: Insufficient documentation

## 2021-10-15 DIAGNOSIS — I890 Lymphedema, not elsewhere classified: Secondary | ICD-10-CM | POA: Insufficient documentation

## 2021-10-15 NOTE — Progress Notes (Signed)
ADONAI, HELZER ([id number]) Visit Report for 10/15/2021 Arrival Information Details Patient Name: Date of Service: Catherine Powell 10/15/2021 3:00 PM Medical Record Number: [id number] Patient Account Number: [id number] Date of Birth/Sex: Treating RN: 1965/12/14 (56 y.o. Catherine Powell, Millard.Loa Primary Care Bowden Boody: Jerre Simon Other Clinician: Referring Fulton Merry: Treating Joban Colledge/Extender: Gearldine Shown in Treatment: 25 Visit Information History Since Last Visit Added or deleted any medications: No Patient Arrived: Wheel Chair Any new allergies or adverse reactions: No Arrival Time: 15:17 Had a fall or experienced change in No Accompanied By: son activities of daily living that may affect Transfer Assistance: None risk of falls: Patient Identification Verified: Yes Signs or symptoms of abuse/neglect since last visito No Secondary Verification Process Completed: Yes Hospitalized since last visit: No Patient Requires Transmission-Based Precautions: No Implantable device outside of the clinic excluding No Patient Has Alerts: No cellular tissue based products placed in the center since last visit: Has Dressing in Place as Prescribed: Yes Pain Present Now: No Notes per patient starting back pumps three times a day once right leg started back with open wounds. Electronic Signature(s) Signed: 10/15/2021 4:18:42 PM By: Shawn Stall RN, BSN Entered By: Shawn Stall on 10/15/2021 15:19:37 -------------------------------------------------------------------------------- Lower Extremity Assessment Details Patient Name: Date of Service: Catherine Powell 10/15/2021 3:00 PM Medical Record Number: [id number] Patient Account Number: [id number] Date of Birth/Sex: Treating RN: Nov 20, 1965 (56 y.o. Catherine Powell, Catherine Powell Primary Care Addisynn Vassell: Jerre Simon Other Clinician: Referring Benedicta Sultan: Treating Arionna Hoggard/Extender: Gearldine Shown in  Treatment: 25 Edema Assessment Assessed: Kyra Searles: No] Franne Forts: Yes] Edema: [Left: Yes] [Right: Yes] Calf Left: Right: Point of Measurement: From Medial Instep 76 cm Ankle Left: Right: Point of Measurement: From Medial Instep 39 cm Electronic Signature(s) Signed: 10/15/2021 4:18:42 PM By: Shawn Stall RN, BSN Entered By: Shawn Stall on 10/15/2021 15:22:18 -------------------------------------------------------------------------------- Multi Wound Chart Details Patient Name: Date of Service: [name] 10/15/2021 3:00 PM Medical Record Number: [id number] Patient Account Number: [id number] Date of Birth/Sex: Treating RN: Feb 29, 1966 (56 y.o. F) Primary Care Geralene Afshar: Jerre Simon Other Clinician: Referring Follett Paone: Treating Ariyona Eid/Extender: Gearldine Shown in Treatment: 25 Vital Signs Height(in): 68 Pulse(bpm): 77 Weight(lbs): 430 Blood Pressure(mmHg): 123/72 Body Mass Index(BMI): 65.4 Temperature(F): 98.3 Respiratory Rate(breaths/min): 22 Photos: [[name]:[name]] Right, Posterior Lower Leg [name] [name] Wound Location: Gradually Appeared [name] [name] Wounding Event: Lymphedema [name] [name] Primary Etiology: Lymphedema, Asthma, Hypertension, [name] [name] Comorbid History: Peripheral Venous Disease, Osteoarthritis 10/08/2021 [name] [name] Date Acquired: 0 [name] [name] Weeks of Treatment: Open [name] [name] Wound Status: No [name] [name] Wound Recurrence: 0.3x0.4x0.2 [name] [name] Measurements L x W x D (cm) 0.094 [name] [name] A (cm) : rea 0.019 [name] [name] Volume (cm) : 0.00% [name] [name] % Reduction in Area: 0.00% [name] [name] % Reduction in Volume: Full Thickness Without Exposed [name] [name] Classification: Support Structures Medium [name] [name] Exudate Amount: Serosanguineous [name] [name] Exudate Type: red, brown [name] [name] Exudate Color: Distinct, outline attached [name] [name] Wound Margin: Small (1-33%) [name] [name] Granulation Amount: Red [name] [name] Granulation Quality: Large (67-100%) [name] [name] Necrotic  Amount: Fat Layer (Subcutaneous Tissue): Yes [name] [name] Exposed Structures: Fascia: No Tendon: No Muscle: No Joint: No Bone: No Small (1-33%) [name] [name] Epithelialization: Treatment Notes Electronic Signature(s) Signed: 10/15/2021 4:20:20 PM By: Geralyn Corwin DO Entered By: Geralyn Corwin on 10/15/2021 15:43:14 -------------------------------------------------------------------------------- Multi-Disciplinary Care Plan Details Patient Name: Date of Service: [name] 10/15/2021 3:00 PM Medical Record Number: [id number] Patient Account  Number: [id number] Date of Birth/Sex: Treating RN: Aug 14, 1965 (56 y.o. Catherine Powell, Catherine Powell Primary Care Aasha Dina: Jerre Simon Other Clinician: Referring Idora Brosious: Treating Summerlynn Glauser/Extender: Gearldine Shown in Treatment: 25 Multidisciplinary Care Plan reviewed with physician Active Inactive Necrotic Tissue Nursing Diagnoses: Impaired tissue integrity related to necrotic/devitalized tissue Goals: Necrotic/devitalized tissue will be minimized in the wound bed Date Initiated: 10/15/2021 Target Resolution Date: 11/15/2021 Goal Status: Active Interventions: Assess patient pain level pre-, during and post procedure and prior to discharge Treatment Activities: Enzymatic debridement : 10/15/2021 T ordered outside of clinic : 10/15/2021 est Notes: Electronic Signature(s) Signed: 10/15/2021 4:18:42 PM By: Shawn Stall RN, BSN Entered By: Shawn Stall on 10/15/2021 15:37:24 -------------------------------------------------------------------------------- Pain Assessment Details Patient Name: Date of Service: [name] 10/15/2021 3:00 PM Medical Record Number: [id number] Patient Account Number: [id number] Date of Birth/Sex: Treating RN: 1966/01/20 (56 y.o. Arta Powell Primary Care Nili Honda: Jerre Simon Other Clinician: Referring Narek Kniss: Treating Kieana Livesay/Extender: Gearldine Shown in  Treatment: 25 Active Problems Location of Pain Severity and Description of Pain Patient Has Paino No Site Locations Rate the pain. Rate the pain. Current Pain Level: 0 Pain Management and Medication Current Pain Management: Medication: No Cold Application: No Rest: No Massage: No Activity: No T.E.N.S.: No Heat Application: No Leg drop or elevation: No Is the Current Pain Management Adequate: Adequate How does your wound impact your activities of daily livingo Sleep: No Bathing: No Appetite: No Relationship With Others: No Bladder Continence: No Emotions: No Bowel Continence: No Work: No Toileting: No Drive: No Dressing: No Hobbies: No Psychologist, prison and probation services) Signed: 10/15/2021 4:18:42 PM By: Shawn Stall RN, BSN Entered By: Shawn Stall on 10/15/2021 15:19:47 -------------------------------------------------------------------------------- Patient/Caregiver Education Details Patient Name: Date of Service: Catherine Powell 8/7/2023andnbsp3:00 PM Medical Record Number: [id number] Patient Account Number: [id number] Date of Birth/Gender: Treating RN: [birth year] (56 y.o. Arta Powell Primary Care Physician: Jerre Simon Other Clinician: Referring Physician: Treating Physician/Extender: Gearldine Shown in Treatment: 25 Education Assessment Education Provided To: Patient Education Topics Provided Wound/Skin Impairment: Handouts: Skin Care Do's and Dont's Methods: Explain/Verbal Responses: Reinforcements needed Electronic Signature(s) Signed: 10/15/2021 4:18:42 PM By: Shawn Stall RN, BSN Entered By: Shawn Stall on 10/15/2021 15:37:35 -------------------------------------------------------------------------------- Wound Assessment Details Patient Name: Date of Service: Catherine Powell. 10/15/2021 3:00 PM Medical Record Number: [id number] Patient Account Number: [id number] Date of Birth/Sex: Treating RN: [birth year] (56 y.o. Catherine Powell, Millard.Loa Primary Care Tahja Liao: Jerre Simon Other Clinician: Referring Lyndel Dancel: Treating Kiaira Pointer/Extender: Gearldine Shown in Treatment: 25 Wound Status Wound Number: [id number] Primary Lymphedema Etiology: Wound Location: Right, Posterior Lower Leg Wound Open Wounding Event: Gradually Appeared Status: Date Acquired: 10/08/2021 Comorbid Lymphedema, Asthma, Hypertension, Peripheral Venous Weeks Of Treatment: 0 History: Disease, Osteoarthritis Clustered Wound: No Photos Wound Measurements Length: (cm) 0.3 Width: (cm) 0.4 Depth: (cm) 0.2 Area: (cm) 0.094 Volume: (cm) 0.019 % Reduction in Area: 0% % Reduction in Volume: 0% Epithelialization: Small (1-33%) Tunneling: No Undermining: No Wound Description Classification: Full Thickness Without Exposed Support Structures Wound Margin: Distinct, outline attached Exudate Amount: Medium Exudate Type: Serosanguineous Exudate Color: red, brown Foul Odor After Cleansing: No Slough/Fibrino Yes Wound Bed Granulation Amount: Small (1-33%) Exposed Structure Granulation Quality: Red Fascia Exposed: No Necrotic Amount: Large (67-100%) Fat Layer (Subcutaneous Tissue) Exposed: Yes Necrotic Quality: Adherent Slough Tendon Exposed: No Muscle Exposed: No Joint Exposed: No Bone Exposed: No Electronic Signature(s) Signed: 10/15/2021 4:18:42 PM By: Shawn Stall RN, BSN Entered By:  Shawn Stall on 10/15/2021 15:25:46 -------------------------------------------------------------------------------- Vitals Details Patient Name: Date of Service: Catherine Powell 10/15/2021 3:00 PM Medical Record Number: [id number] Patient Account Number: [id number] Date of Birth/Sex: Treating RN: 1965/04/02 (56 y.o. Catherine Powell, Catherine Powell Primary Care Cyani Kallstrom: Jerre Simon Other Clinician: Referring Eloina Ergle: Treating Fergie Sherbert/Extender: Gearldine Shown in Treatment: 25 Vital Signs Time Taken:  15:24 Temperature (F): 98.3 Height (in): 68 Pulse (bpm): 77 Weight (lbs): 430 Respiratory Rate (breaths/min): 22 Body Mass Index (BMI): 65.4 Blood Pressure (mmHg): 123/72 Reference Range: 80 - 120 mg / dl Electronic Signature(s) Signed: 10/15/2021 4:18:42 PM By: Shawn Stall RN, BSN Entered By: Shawn Stall on 10/15/2021 15:24:30

## 2021-10-15 NOTE — Progress Notes (Signed)
[place], [place] ([id number]) Visit Report for 10/15/2021 Chief Complaint Document Details Patient Name: Date of Service: Catherine Powell 10/15/2021 3:00 PM Medical Record Number: [id number] Patient Account Number: [id number] Date of Birth/Sex: Treating RN: 03/15/1965 (56 y.o. F) Primary Care Provider: Jerre Simon Other Clinician: Referring Provider: Treating Provider/Extender: Gearldine Shown in Treatment: 25 Information Obtained from: Patient Chief Complaint Right posterior leg wound Electronic Signature(s) Signed: 10/15/2021 4:20:20 PM By: Geralyn Corwin DO Entered By: Geralyn Corwin on 10/15/2021 15:43:20 -------------------------------------------------------------------------------- HPI Details Patient Name: Date of Service: Catherine Hock H. 10/15/2021 3:00 PM Medical Record Number: [id number] Patient Account Number: [id number] Date of Birth/Sex: Treating RN: 1965-07-25 (56 y.o. F) Primary Care Provider: Jerre Simon Other Clinician: Referring Provider: Treating Provider/Extender: Gearldine Shown in Treatment: 25 History of Present Illness HPI Description: Admission 04/20/2021 Ms. Jatoria Kneeland is a 56 year old female with a past medical history of morbid obesity with BMI of 66 and wheelchair dependent due to this, lymphedema and hypertension that presents to the clinic for a 58-month history of nonhealing ulcer to the right calf. She states this started spontaneously and has been using Unna boots and Xeroform with benefit She saw vein and vascular who recommended bariatric surgery. She currently denies systemic signs of infection. 2/17; patient presents for follow-up. She tolerated the compression wrap well. She does report increased tenderness to the surrounding wound bed. 2/24; patient presents for follow-up. She reports taking Keflex with improvement in her symptoms. She has developed some vaginal itching and reports  a history of yeast infection after taking antibiotics. She has her juxta lite compression today. 3/27; patient presents for follow-up. Patient states that she has had 2 areas open up to the right lower extremity over the past week. She was started on Keflex by her primary care physician for potential cellulitis. She has been using her juxta light compression daily without issues. She also has a compression stocking to the left lower extremity that she has been using since discharge from clinic on 2/24. 4/4; patient presents for follow-up. She reports that the compression wrap placed in clinic last visit slid down the following day. She has been using silver alginate to the wound site and using her juxta lite compression daily. She has no issues or complaints today. She would like a note to be out of work so that she can elevate her legs to help heal the wounds. 4/11; the patient arrives today with the area on her right posterior calf totally closed. She has severe lymphedema but has juxta lite stockings and new compression pumps arrive this morning she has not yet used them. 4/26; patient presents with reopening of her previous wounds on the right posterior calf Over the past week. She states she has been using her juxta lite compressions and lymphedema pumps. She is not on a diuretic. 5/18; patient presents for follow-up. She continues to have a wound to the right posterior calf and has developed new wounds to the left lower extremity. She states these happened spontaneously over the past week. She has a juxta light compression however has not been wearing this to the left lower extremity. She has been wearing her juxta lite to the right lower extremity. She denies signs of infection. 5/25; patient presents for follow-up. The compression wrap slid off 1 to 2 days after it was placed in office last week. She has been using silver alginate daily to the wound beds. She has also been wearing  her juxta  light compression. She currently denies signs of infection. 6/8; patient with bilateral lymphedema wounds on the right greater than left posterior calf. In fact the areas on the left are close to being closed. There is some degree of venous inflammation and skin changes from chronic edema. She is using Iodoflex to the wounds She has pumps at home that she is using twice a day. She has an appointment to make custom made stockings. Apparently the juxta lites are not staying on very well and even our compression wraps do not stay up for the patient 6/27; patient presents for follow-up. She has been using Iodosorb to the wound beds. Along with her juxta lite compressions daily. She states she has been using her lymphedema pumps 3 times daily as well. Unfortunately she does not currently have health insurance. She has not been able to get her compression garments fitted due to this reason. She currently wants to remain out of work to help these wounds heal. She has no issues or complaints today. 7/11; patient presents for follow-up. She has been using Iodosorb to the wound beds. She uses her juxta lite compressions daily. One of the wounds on the right posterior leg is healed. She would like to return back to work. At this time she does not want to obtain her custom compression garments due to financial reasons. 7/25; patient presents for follow-up. We have been using Iodoflex to the wound beds. Her wounds are healed today. She has her juxta lite compressions with her. She states she is going to try and get her custom compression garments. At this time she can return back to work full-time. 8/7; patient presents for follow-up. At last clinic visit the patient was discharged due to closed wound. She has developed a new wound to the posterior right leg limited to skin breakdown. She has been using hydrogen peroxide in her juxta light. She states she is using her lymphedema pumps several times a day.  She would like a note to be out of work. Electronic Signature(s) Signed: 10/15/2021 4:20:20 PM By: Geralyn Corwin DO Entered By: Geralyn Corwin on 10/15/2021 15:44:18 -------------------------------------------------------------------------------- Physical Exam Details Patient Name: Date of Service: Catherine Hock H. 10/15/2021 3:00 PM Medical Record Number: [id number] Patient Account Number: [id number] Date of Birth/Sex: Treating RN: 08-10-1965 (56 y.o. F) Primary Care Provider: Jerre Simon Other Clinician: Referring Provider: Treating Provider/Extender: Gearldine Shown in Treatment: 25 Constitutional respirations regular, non-labored and within target range for patient.. Cardiovascular 2+ dorsalis pedis/posterior tibialis pulses. Psychiatric pleasant and cooperative. Notes Right lower extremity: T the posterior aspect there are a few scattered areas limited to skin breakdown. Significant bilateral lymphedema. o Electronic Signature(s) Signed: 10/15/2021 4:20:20 PM By: Geralyn Corwin DO Entered By: Geralyn Corwin on 10/15/2021 15:44:43 -------------------------------------------------------------------------------- Physician Orders Details Patient Name: Date of Service: Catherine Hock H. 10/15/2021 3:00 PM Medical Record Number: [id number] Patient Account Number: [id number] Date of Birth/Sex: Treating RN: December 01, 1965 (56 y.o. Debara Pickett, Millard.Loa Primary Care Provider: Jerre Simon Other Clinician: Referring Provider: Treating Provider/Extender: Gearldine Shown in Treatment: 25 Verbal / Phone Orders: No Diagnosis Coding ICD-10 Coding Code Description [phone] Non-pressure chronic ulcer of other part of right lower leg with fat layer exposed L97.822 Non-pressure chronic ulcer of other part of left lower leg with fat layer exposed I87.313 Chronic venous hypertension (idiopathic) with ulcer of bilateral lower extremity I89.0  Lymphedema, not elsewhere classified Z99.3 Dependence on wheelchair E66.01 Morbid (severe) obesity  due to excess calories Follow-up Appointments ppointment in 2 weeks. - Dr. Mikey BussingHoffman and HortenseBobbi, Room 8 130pm 10/29/2021 Monday Return A Other: - Patient to follow up with new PCP 10/26/2021 concerning about work as well. Edema Control - Lymphedema / SCD / Other Lymphedema Pumps. Use Lymphedema pumps on leg(s) 2-3 times a day for 45-60 minutes. If wearing any wraps or hose, do not remove them. Continue exercising as instructed. - 3 times a day for an hour each time lymphedema pumps. while working pump before, after work, and at bedtime. Elevate legs to the level of the heart or above for 30 minutes daily and/or when sitting, a frequency of: - throughout the day. Avoid standing for long periods of time. Exercise regularly Moisturize legs daily. - apply left leg every night before bed. Compression stocking or Garment 30-40 mm/Hg pressure to: - Apply juxtalite HD in the morning and remove at night to both legs. Wound Treatment Wound #8 - Lower Leg Wound Laterality: Right, Posterior Cleanser: Soap and Water Every Other Day/30 Days Discharge Instructions: May shower and wash wound with dial antibacterial soap and water prior to dressing change. Cleanser: Wound Cleanser Every Other Day/30 Days Discharge Instructions: Cleanse the wound with wound cleanser prior to applying a clean dressing using gauze sponges, not tissue or cotton balls. Prim Dressing: Iodosorb Gel 10 (gm) Tube Every Other Day/30 Days ary Discharge Instructions: Apply to wound bed as instructed Secondary Dressing: bandaid Every Other Day/30 Days Discharge Instructions: bandaid Electronic Signature(s) Signed: 10/15/2021 4:20:20 PM By: Geralyn CorwinHoffman, Miriam Kestler DO Entered By: Geralyn CorwinHoffman, Anthonie Lotito on 10/15/2021 15:44:50 -------------------------------------------------------------------------------- Problem List Details Patient Name: Date of  Service: Catherine Powell, Catherine ERLY H. 10/15/2021 3:00 PM Medical Record Number: [id number] Patient Account Number: [id number] Date of Birth/Sex: Treating RN: 10/18/1965 (56 y.o. Debara PickettF) [name], Millard.LoaBobbi Primary Care Provider: Jerre SimonNorbert, John Other Clinician: Referring Provider: Treating Provider/Extender: Gearldine ShownHoffman, [place] Norbert, John Weeks in Treatment: 25 Active Problems ICD-10 Encounter Code Description Active Date MDM Diagnosis L97.812 Non-pressure chronic ulcer of other part of right lower leg with fat layer 04/20/2021 No Yes exposed L97.822 Non-pressure chronic ulcer of other part of left lower leg with fat layer exposed5/18/2023 No Yes I87.313 Chronic venous hypertension (idiopathic) with ulcer of bilateral lower extremity 08/02/2021 No Yes I89.0 Lymphedema, not elsewhere classified 04/20/2021 No Yes Z99.3 Dependence on wheelchair 04/20/2021 No Yes E66.01 Morbid (severe) obesity due to excess calories 04/20/2021 No Yes Inactive Problems Resolved Problems ICD-10 Code Description Active Date Resolved Date B37.31 Acute candidiasis of vulva and vagina 05/04/2021 05/04/2021 Electronic Signature(s) Signed: 10/15/2021 4:20:20 PM By: Geralyn CorwinHoffman, Lateya Dauria DO Entered By: Geralyn CorwinHoffman, Pranay Hilbun on 10/15/2021 15:43:10 -------------------------------------------------------------------------------- Progress Note Details Patient Name: Date of Service: Catherine Powell, Catherine ERLY H. 10/15/2021 3:00 PM Medical Record Number: [id number] Patient Account Number: [id number] Date of Birth/Sex: Treating RN: 04/05/1965 (56 y.o. F) Primary Care Provider: Jerre SimonNorbert, John Other Clinician: Referring Provider: Treating Provider/Extender: Gearldine ShownHoffman, Sharese Manrique Norbert, John Weeks in Treatment: 25 Subjective Chief Complaint Information obtained from Patient Right posterior leg wound History of Present Illness (HPI) Admission 04/20/2021 Ms. Virgina NorfolkBeverly Widger is a 56 year old female with a past medical history of morbid obesity with BMI of 66 and  wheelchair dependent due to this, lymphedema and hypertension that presents to the clinic for a 164-month history of nonhealing ulcer to the right calf. She states this started spontaneously and has been using Unna boots and Xeroform with benefit She saw vein and vascular who recommended bariatric surgery. She currently denies systemic signs of infection. 2/17; patient presents  for follow-up. She tolerated the compression wrap well. She does report increased tenderness to the surrounding wound bed. 2/24; patient presents for follow-up. She reports taking Keflex with improvement in her symptoms. She has developed some vaginal itching and reports a history of yeast infection after taking antibiotics. She has her juxta lite compression today. 3/27; patient presents for follow-up. Patient states that she has had 2 areas open up to the right lower extremity over the past week. She was started on Keflex by her primary care physician for potential cellulitis. She has been using her juxta light compression daily without issues. She also has a compression stocking to the left lower extremity that she has been using since discharge from clinic on 2/24. 4/4; patient presents for follow-up. She reports that the compression wrap placed in clinic last visit slid down the following day. She has been using silver alginate to the wound site and using her juxta lite compression daily. She has no issues or complaints today. She would like a note to be out of work so that she can elevate her legs to help heal the wounds. 4/11; the patient arrives today with the area on her right posterior calf totally closed. She has severe lymphedema but has juxta lite stockings and new compression pumps arrive this morning she has not yet used them. 4/26; patient presents with reopening of her previous wounds on the right posterior calf Over the past week. She states she has been using her juxta lite compressions and lymphedema pumps.  She is not on a diuretic. 5/18; patient presents for follow-up. She continues to have a wound to the right posterior calf and has developed new wounds to the left lower extremity. She states these happened spontaneously over the past week. She has a juxta light compression however has not been wearing this to the left lower extremity. She has been wearing her juxta lite to the right lower extremity. She denies signs of infection. 5/25; patient presents for follow-up. The compression wrap slid off 1 to 2 days after it was placed in office last week. She has been using silver alginate daily to the wound beds. She has also been wearing her juxta light compression. She currently denies signs of infection. 6/8; patient with bilateral lymphedema wounds on the right greater than left posterior calf. In fact the areas on the left are close to being closed. There is some degree of venous inflammation and skin changes from chronic edema. She is using Iodoflex to the wounds She has pumps at home that she is using twice a day. She has an appointment to make custom made stockings. Apparently the juxta lites are not staying on very well and even our compression wraps do not stay up for the patient 6/27; patient presents for follow-up. She has been using Iodosorb to the wound beds. Along with her juxta lite compressions daily. She states she has been using her lymphedema pumps 3 times daily as well. Unfortunately she does not currently have health insurance. She has not been able to get her compression garments fitted due to this reason. She currently wants to remain out of work to help these wounds heal. She has no issues or complaints today. 7/11; patient presents for follow-up. She has been using Iodosorb to the wound beds. She uses her juxta lite compressions daily. One of the wounds on the right posterior leg is healed. She would like to return back to work. At this time she does not want to obtain  her custom  compression garments due to financial reasons. 7/25; patient presents for follow-up. We have been using Iodoflex to the wound beds. Her wounds are healed today. She has her juxta lite compressions with her. She states she is going to try and get her custom compression garments. At this time she can return back to work full-time. 8/7; patient presents for follow-up. At last clinic visit the patient was discharged due to closed wound. She has developed a new wound to the posterior right leg limited to skin breakdown. She has been using hydrogen peroxide in her juxta light. She states she is using her lymphedema pumps several times a day. She would like a note to be out of work. Patient History Information obtained from Patient. Family History Unknown History. Social History Never smoker, Marital Status - Married, Alcohol Use - Never, Drug Use - No History, Caffeine Use - Daily - coffee. Medical History Hematologic/Lymphatic Patient has history of Lymphedema Respiratory Patient has history of Asthma Cardiovascular Patient has history of Hypertension, Peripheral Venous Disease Musculoskeletal Patient has history of Osteoarthritis Hospitalization/Surgery History - knee right arthoscopy. - appendectomy. - cardiac cath. - cervical fusion. - cholecystectomy. - wisdom tooth. Medical A Surgical History Notes nd Hematologic/Lymphatic Hypercholesterolemia Gastrointestinal GERD Musculoskeletal Degenerative joint disease of knees Objective Constitutional respirations regular, non-labored and within target range for patient.. Vitals Time Taken: 3:24 PM, Height: 68 in, Weight: 430 lbs, BMI: 65.4, Temperature: 98.3 F, Pulse: 77 bpm, Respiratory Rate: 22 breaths/min, Blood Pressure: 123/72 mmHg. Cardiovascular 2+ dorsalis pedis/posterior tibialis pulses. Psychiatric pleasant and cooperative. General Notes: Right lower extremity: T the posterior aspect there are a few scattered areas limited  to skin breakdown. Significant bilateral lymphedema. o Integumentary (Hair, Skin) Wound #8 status is Open. Original cause of wound was Gradually Appeared. The date acquired was: 10/08/2021. The wound is located on the Right,Posterior Lower Leg. The wound measures 0.3cm length x 0.4cm width x 0.2cm depth; 0.094cm^2 area and 0.019cm^3 volume. There is Fat Layer (Subcutaneous Tissue) exposed. There is no tunneling or undermining noted. There is a medium amount of serosanguineous drainage noted. The wound margin is distinct with the outline attached to the wound base. There is small (1-33%) red granulation within the wound bed. There is a large (67-100%) amount of necrotic tissue within the wound bed including Adherent Slough. Assessment Active Problems ICD-10 Non-pressure chronic ulcer of other part of right lower leg with fat layer exposed Non-pressure chronic ulcer of other part of left lower leg with fat layer exposed Chronic venous hypertension (idiopathic) with ulcer of bilateral lower extremity Lymphedema, not elsewhere classified Dependence on wheelchair Morbid (severe) obesity due to excess calories Patient has a new wound to the posterior right leg limited to skin breakdown. I recommended using Iodosorb and her juxta lite compression daily. She does not like to have compression wraps done in our office. She would like to be out of work. I did recommend she talk to her primary care physician about being on disability. She has requested to be out of work throughout most of the time I have treated her. Her main issue here is her morbid obesity. She has no plans for weight loss management at this time. She will likely have recurrence of wounds because of this. Plan Follow-up Appointments: Return Appointment in 2 weeks. - Dr. Mikey Bussing and [place], Room 8 130pm 10/29/2021 Monday Other: - Patient to follow up with new PCP 10/26/2021 concerning about work as well. Edema Control - Lymphedema / SCD /  Other: Lymphedema Pumps. Use Lymphedema pumps on leg(s) 2-3 times a day for 45-60 minutes. If wearing any wraps or hose, do not remove them. Continue exercising as instructed. - 3 times a day for an hour each time lymphedema pumps. while working pump before, after work, and at bedtime. Elevate legs to the level of the heart or above for 30 minutes daily and/or when sitting, a frequency of: - throughout the day. Avoid standing for long periods of time. Exercise regularly Moisturize legs daily. - apply left leg every night before bed. Compression stocking or Garment 30-40 mm/Hg pressure to: - Apply juxtalite HD in the morning and remove at night to both legs. WOUND #8: - Lower Leg Wound Laterality: Right, Posterior Cleanser: Soap and Water Every Other Day/30 Days Discharge Instructions: May shower and wash wound with dial antibacterial soap and water prior to dressing change. Cleanser: Wound Cleanser Every Other Day/30 Days Discharge Instructions: Cleanse the wound with wound cleanser prior to applying a clean dressing using gauze sponges, not tissue or cotton balls. Prim Dressing: Iodosorb Gel 10 (gm) Tube Every Other Day/30 Days ary Discharge Instructions: Apply to wound bed as instructed Secondary Dressing: bandaid Every Other Day/30 Days Discharge Instructions: bandaid 1. Iodosorb 2. Juxta light compression 3. 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 10/15/2021 3:00 PM Medical Record Number: [id number] Patient Account Number: [id number] Date of Birth/Sex: Treating RN: January 26, 1966 (56 y.o. F) Primary Care Provider: Jerre Simon Other Clinician: Referring Provider: Treating Provider/Extender: Gearldine Shown in Treatment: 25 Information Obtained From Patient Hematologic/Lymphatic Medical History: Positive for: Lymphedema Past Medical History Notes: Hypercholesterolemia Respiratory Medical History: Positive for: Asthma Cardiovascular Medical History: Positive for: Hypertension; Peripheral Venous Disease Gastrointestinal Medical History: Past Medical History Notes: GERD Musculoskeletal Medical History: Positive for: Osteoarthritis Past Medical History Notes: Degenerative joint disease of knees Immunizations Pneumococcal Vaccine: Received Pneumococcal Vaccination: No Implantable Devices No devices added Hospitalization / Surgery History Type of Hospitalization/Surgery knee right arthoscopy appendectomy cardiac cath cervical fusion cholecystectomy wisdom tooth Family and Social History Unknown History: Yes; Never smoker; Marital Status - Married; Alcohol Use: Never; Drug Use: No History; Caffeine Use: Daily - coffee; Financial Concerns: No; Food, Clothing or Shelter Needs: No; Support System Lacking: No; Transportation Concerns: No Electronic Signature(s) Signed: 10/15/2021 4:20:20 PM By: Geralyn Corwin DO Entered By: Geralyn Corwin on 10/15/2021 15:44:23 -------------------------------------------------------------------------------- SuperBill Details Patient Name: Date of Service: Catherine Powell 10/15/2021 Medical Record Number: [id number] Patient Account Number: [id number] Date of Birth/Sex: Treating RN: Dec 16, 1965 (56 y.o. F) Primary Care Provider: Jerre Simon Other Clinician: Referring Provider: Treating Provider/Extender: Gearldine Shown in Treatment: 25 Diagnosis Coding ICD-10 Codes Code Description [phone] Non-pressure chronic ulcer of other part of right lower leg with fat layer exposed L97.822 Non-pressure chronic ulcer of other part of left lower leg with fat layer exposed I87.313 Chronic venous  hypertension (idiopathic) with ulcer of bilateral lower extremity I89.0 Lymphedema, not elsewhere classified Z99.3 Dependence on wheelchair E66.01 Morbid (severe) obesity due to excess calories Physician Procedures : CPT4 Code Description Modifier [id number] - WC PHYS LEVEL 3 - EST PT ICD-10 Diagnosis Description L97.812 Non-pressure chronic ulcer of other part of right lower leg with fat layer exposed I89.0 Lymphedema, not elsewhere classified Z99.3 Dependence  on wheelchair E66.01 Morbid (  severe) obesity due to excess calories Quantity: 1 Electronic Signature(s) Signed: 10/15/2021 4:20:20 PM By: Geralyn Corwin DO Entered By: Geralyn Corwin on 10/15/2021 15:49:01

## 2021-10-17 ENCOUNTER — Encounter (INDEPENDENT_AMBULATORY_CARE_PROVIDER_SITE_OTHER): Payer: Self-pay

## 2021-10-21 ENCOUNTER — Other Ambulatory Visit: Payer: Self-pay | Admitting: Family Medicine

## 2021-10-26 ENCOUNTER — Ambulatory Visit (INDEPENDENT_AMBULATORY_CARE_PROVIDER_SITE_OTHER): Payer: Self-pay | Admitting: Student

## 2021-10-26 ENCOUNTER — Encounter: Payer: Self-pay | Admitting: Student

## 2021-10-26 VITALS — BP 124/76 | HR 85

## 2021-10-26 DIAGNOSIS — G8929 Other chronic pain: Secondary | ICD-10-CM

## 2021-10-26 DIAGNOSIS — I10 Essential (primary) hypertension: Secondary | ICD-10-CM

## 2021-10-26 DIAGNOSIS — I890 Lymphedema, not elsewhere classified: Secondary | ICD-10-CM

## 2021-10-26 NOTE — Assessment & Plan Note (Addendum)
Patient is wheelchair dependent and on exam has significant edema of bilateral lower extremity. Patient would like to get on Social Security disability benefit given her difficulty with mobility and how it's affecting her work. -Completed patient's work restriction form -Provided patient with resources for guidance on the disability application process -Obtained BMP  -Will increase Lasix to 3 times daily pending BMP findings. -Sent in referral to community care coordinator.

## 2021-10-26 NOTE — Assessment & Plan Note (Deleted)
Patient is on chronic pain management for severe osteoarthritis and cervical radiculopathy.  Medication include Norco/Vicodin 5-325 mg.  It is well controlled on the current medication. -We will to review PDMP -Refilled medication -Reviewed side effect

## 2021-10-26 NOTE — Patient Instructions (Addendum)
It was wonderful to meet you today. Thank you for allowing me to be a part of your care. Below is a short summary of what we discussed at your visit today:  Will check your electrolyte before we increased your 3 times daily.  Manage with resources on how to apply for adult disability. Also sent in referral for care coordinator to help you with the process.  If you have any questions or concerns, please do not hesitate to contact us via phone or MyChart message.   Jerre Simon, MD [facility]

## 2021-10-26 NOTE — Progress Notes (Signed)
    SUBJECTIVE:   CHIEF COMPLAINT / HPI:   56 year old female with past medical history of hypertension, chronic venous insufficiency, lymphedema and wheel chair dependent presents today to inquiry about getting on disability.  Her work cancelled her insurance on June first because short term disability was expired and she has tried to get on  long term disability which was declined because they do not have doctor's note. Patient works in [employer] and Job involves sitting for a prolonged period of time. Sitting over a prolonged period of time cause leg swelling and worsening of hey lymphedema. Patient reports when her swelling gets worse she starts developing new ulcers on her posterior calf and popliteal area. She needs assistance with getting up and moving. Currently wheel chair dependent. Patient reported she's been told by work not to return to work if there are still restrictions. Lasix does help with the swelling but was previously discussed  with Dr. Clent Ridges that she could go up to 20mg  TID. She is tolerating it well so far.    PERTINENT  PMH / PSH: CAD dependent, lymphedema,  OBJECTIVE:   BP 124/76   Pulse 85   LMP 05/15/2012   SpO2 96%    Physical Exam General: Alert, morbidly obese,  NAD Cardiovascular: RRR, No Murmurs, Normal S2/S2 Respiratory: CTAB, No wheezing or Rales Abdomen: No distension or tenderness Extremities: Significant bilateral lower extremity edema, dressed in Unna boots  ASSESSMENT/PLAN:   Lymphedema Patient is wheelchair dependent and on exam has significant edema of bilateral lower extremity. Patient would like to get on Social Security disability benefit given her difficulty with mobility and how it's affecting her work. -Completed patient's work restriction form -Provided patient with resources for guidance on the disability application process -Obtained BMP  -Will increase Lasix to 3 times daily pending BMP findings. -Sent in referral to community care  coordinator.     07/15/2012, MD [facility]

## 2021-10-27 LAB — BASIC METABOLIC PANEL
BUN/Creatinine Ratio: 11 (ref 9–23)
BUN: 9 mg/dL (ref 6–24)
CO2: 26 mmol/L (ref 20–29)
Calcium: 9.4 mg/dL (ref 8.7–10.2)
Chloride: 101 mmol/L (ref 96–106)
Creatinine, Ser: 0.79 mg/dL (ref 0.57–1.00)
Glucose: 89 mg/dL (ref 70–99)
Potassium: 4 mmol/L (ref 3.5–5.2)
Sodium: 139 mmol/L (ref 134–144)
eGFR: 87 mL/min/{1.73_m2} (ref >59)

## 2021-10-29 ENCOUNTER — Encounter (HOSPITAL_BASED_OUTPATIENT_CLINIC_OR_DEPARTMENT_OTHER): Payer: Self-pay | Admitting: Internal Medicine

## 2021-10-29 ENCOUNTER — Telehealth: Payer: Self-pay | Admitting: *Deleted

## 2021-10-29 DIAGNOSIS — L97822 Non-pressure chronic ulcer of other part of left lower leg with fat layer exposed: Secondary | ICD-10-CM

## 2021-10-29 DIAGNOSIS — I87313 Chronic venous hypertension (idiopathic) with ulcer of bilateral lower extremity: Secondary | ICD-10-CM

## 2021-10-29 DIAGNOSIS — L97812 Non-pressure chronic ulcer of other part of right lower leg with fat layer exposed: Secondary | ICD-10-CM

## 2021-10-29 NOTE — Chronic Care Management (AMB) (Signed)
  Care Coordination  Outreach Note  10/29/2021 Name: Catherine Powell MRN: [id number] DOB: 1965-07-08   Care Coordination Outreach Attempts  An unsuccessful telephone outreach was attempted today to offer the patient information about available care coordination services as a benefit of their health plan.   Follow Up Plan:  Additional outreach attempts will be made to offer the patient care coordination information and services.   Outreaching for referral for RNCM per AG reach out for care coordination.   Encounter Outcome:  No Answer  Gwenevere Ghazi  Care Coordination Care Guide  Direct Dial: [phone]

## 2021-10-29 NOTE — Chronic Care Management (AMB) (Signed)
  Care Coordination   Note   10/29/2021 Name: Catherine Powell MRN: [id number] DOB: 1965/12/27  Catherine Powell is a 56 y.o. year old female who sees Jerre Simon, MD for primary care. I reached out to [name] by phone today to offer care coordination services.  [name] was given information about Care Coordination services today including:   The Care Coordination services include support from the care team which includes your Nurse Coordinator, Clinical Social Worker, or Pharmacist.  The Care Coordination team is here to help remove barriers to the health concerns and goals most important to you. Care Coordination services are voluntary, and the patient may decline or stop services at any time by request to their care team member.   Care Coordination Consent Status: Patient agreed to services and verbal consent obtained.   Follow up plan:  Telephone appointment with care coordination team member scheduled for:  10/30/21  Encounter Outcome:  Pt. Scheduled  [facility] Coordination Care Guide  Direct Dial: [phone]

## 2021-10-29 NOTE — Progress Notes (Signed)
ALLENE, BALOUGH ([id number]) Visit Report for 10/29/2021 Chief Complaint Document Details Patient Name: Date of Service: Catherine Powell 10/29/2021 1:30 PM Medical Record Number: [id number] Patient Account Number: [id number] Date of Birth/Sex: Treating RN: 05-Jun-1965 (56 y.o. F) Primary Care Provider: Alen Bleacher Other Clinician: Referring Provider: Treating Provider/Extender: Jannet Askew in Treatment: 27 Information Obtained from: Patient Chief Complaint Right posterior leg wound Electronic Signature(s) Signed: 10/29/2021 2:26:36 PM By: Kalman Shan DO Entered By: Kalman Shan on 10/29/2021 14:20:48 -------------------------------------------------------------------------------- HPI Details Patient Name: Date of Service: Catherine Curling H. 10/29/2021 1:30 PM Medical Record Number: [id number] Patient Account Number: [id number] Date of Birth/Sex: Treating RN: 1965/06/14 (56 y.o. F) Primary Care Provider: Alen Bleacher Other Clinician: Referring Provider: Treating Provider/Extender: Jannet Askew in Treatment: 27 History of Present Illness HPI Description: Admission 04/20/2021 Catherine Powell is a 56 year old female with a past medical history of morbid obesity with BMI of 66 and wheelchair dependent due to this, lymphedema and hypertension that presents to the clinic for a 37-month history of nonhealing ulcer to the right calf. She states this started spontaneously and has been using Unna boots and Xeroform with benefit She saw vein and vascular who recommended bariatric surgery. She currently denies systemic signs of infection. 2/17; patient presents for follow-up. She tolerated the compression wrap well. She does report increased tenderness to the surrounding wound bed. 2/24; patient presents for follow-up. She reports taking Keflex with improvement in her symptoms. She has developed some vaginal itching and reports  a history of yeast infection after taking antibiotics. She has her juxta lite compression today. 3/27; patient presents for follow-up. Patient states that she has had 2 areas open up to the right lower extremity over the past week. She was started on Keflex by her primary care physician for potential cellulitis. She has been using her juxta light compression daily without issues. She also has a compression stocking to the left lower extremity that she has been using since discharge from clinic on 2/24. 4/4; patient presents for follow-up. She reports that the compression wrap placed in clinic last visit slid down the following day. She has been using silver alginate to the wound site and using her juxta lite compression daily. She has no issues or complaints today. She would like a note to be out of work so that she can elevate her legs to help heal the wounds. 4/11; the patient arrives today with the area on her right posterior calf totally closed. She has severe lymphedema but has juxta lite stockings and new compression pumps arrive this morning she has not yet used them. 4/26; patient presents with reopening of her previous wounds on the right posterior calf Over the past week. She states she has been using her juxta lite compressions and lymphedema pumps. She is not on a diuretic. 5/18; patient presents for follow-up. She continues to have a wound to the right posterior calf and has developed new wounds to the left lower extremity. She states these happened spontaneously over the past week. She has a juxta light compression however has not been wearing this to the left lower extremity. She has been wearing her juxta lite to the right lower extremity. She denies signs of infection. 5/25; patient presents for follow-up. The compression wrap slid off 1 to 2 days after it was placed in office last week. She has been using silver alginate daily to the wound beds. She has also been wearing  her juxta  light compression. She currently denies signs of infection. 6/8; patient with bilateral lymphedema wounds on the right greater than left posterior calf. In fact the areas on the left are close to being closed. There is some degree of venous inflammation and skin changes from chronic edema. She is using Iodoflex to the wounds She has pumps at home that she is using twice a day. She has an appointment to make custom made stockings. Apparently the juxta lites are not staying on very well and even our compression wraps do not stay up for the patient 6/27; patient presents for follow-up. She has been using Iodosorb to the wound beds. Along with her juxta lite compressions daily. She states she has been using her lymphedema pumps 3 times daily as well. Unfortunately she does not currently have health insurance. She has not been able to get her compression garments fitted due to this reason. She currently wants to remain out of work to help these wounds heal. She has no issues or complaints today. 7/11; patient presents for follow-up. She has been using Iodosorb to the wound beds. She uses her juxta lite compressions daily. One of the wounds on the right posterior leg is healed. She would like to return back to work. At this time she does not want to obtain her custom compression garments due to financial reasons. 7/25; patient presents for follow-up. We have been using Iodoflex to the wound beds. Her wounds are healed today. She has her juxta lite compressions with her. She states she is going to try and get her custom compression garments. At this time she can return back to work full-time. 8/7; patient presents for follow-up. At last clinic visit the patient was discharged due to closed wound. She has developed a new wound to the posterior right leg limited to skin breakdown. She has been using hydrogen peroxide in her juxta light. She states she is using her lymphedema pumps several times a day.  She would like a note to be out of work. 8/21; patient presents for follow-up. She has been using Iodosorb to the wound bed along with her compression Velcro wraps. She has no open wounds to the left lower extremity. Electronic Signature(s) Signed: 10/29/2021 2:26:36 PM By: Kalman Shan DO Entered By: Kalman Shan on 10/29/2021 14:23:39 -------------------------------------------------------------------------------- Physical Exam Details Patient Name: Date of Service: Catherine Powell 10/29/2021 1:30 PM Medical Record Number: [id number] Patient Account Number: [id number] Date of Birth/Sex: Treating RN: February 10, 1966 (56 y.o. F) Primary Care Provider: Alen Bleacher Other Clinician: Referring Provider: Treating Provider/Extender: Jannet Askew in Treatment: 27 Constitutional respirations regular, non-labored and within target range for patient.. Cardiovascular 2+ dorsalis pedis/posterior tibialis pulses. Psychiatric pleasant and cooperative. Notes Right lower extremity: T the posterior aspect there are 2 small areas limited to skin breakdown. Significant bilateral lymphedema. o Electronic Signature(s) Signed: 10/29/2021 2:26:36 PM By: Kalman Shan DO Entered By: Kalman Shan on 10/29/2021 14:24:00 -------------------------------------------------------------------------------- Physician Orders Details Patient Name: Date of Service: Catherine Powell. 10/29/2021 1:30 PM Medical Record Number: [id number] Patient Account Number: [id number] Date of Birth/Sex: Treating RN: [birth year] (56 y.o. [name] Primary Care Provider: Alen Bleacher Other Clinician: Referring Provider: Treating Provider/Extender: Jannet Askew in Treatment: 27 Verbal / Phone Orders: No Diagnosis Coding ICD-10 Coding Code Description [phone] Non-pressure chronic ulcer of other part of right lower leg with fat layer exposed L97.822  Non-pressure chronic ulcer of other part of left lower  leg with fat layer exposed I87.313 Chronic venous hypertension (idiopathic) with ulcer of bilateral lower extremity I89.0 Lymphedema, not elsewhere classified Z99.3 Dependence on wheelchair E66.01 Morbid (severe) obesity due to excess calories Follow-up Appointments Return appointment in 3 weeks. - Dr. Mikey Bussing and [place], Room 8 130pm 11/19/2021 Monday Other: - Patient could go back to work for now. Edema Control - Lymphedema / SCD / Other Lymphedema Pumps. Use Lymphedema pumps on leg(s) 2-3 times a day for 45-60 minutes. If wearing any wraps or hose, do not remove them. Continue exercising as instructed. - 2 times a day for an hour each time lymphedema pumps. while working pump before, after work, and at bedtime. Elevate legs to the level of the heart or above for 30 minutes daily and/or when sitting, a frequency of: - throughout the day. Avoid standing for long periods of time. Exercise regularly Moisturize legs daily. - apply left leg every night before bed. Compression stocking or Garment 30-40 mm/Hg pressure to: - Apply juxtalite HD in the morning and remove at night to both legs. Wound Treatment Wound #8 - Lower Leg Wound Laterality: Right, Posterior Cleanser: Soap and Water 3 x Per Day/30 Days Discharge Instructions: May shower and wash wound with dial antibacterial soap and water prior to dressing change. Cleanser: Wound Cleanser 3 x Per Day/30 Days Discharge Instructions: Cleanse the wound with wound cleanser prior to applying a clean dressing using gauze sponges, not tissue or cotton balls. Prim Dressing: Fibracol Plus Dressing, 4x4.38 in (collagen) 3 x Per Day/30 Days ary Discharge Instructions: Moisten collagen with saline or hydrogel Secondary Dressing: bandaid 3 x Per Day/30 Days Discharge Instructions: bandaid Electronic Signature(s) Signed: 10/29/2021 2:26:36 PM By: Geralyn Corwin DO Entered By: Geralyn Corwin on  10/29/2021 14:24:06 -------------------------------------------------------------------------------- Problem List Details Patient Name: Date of Service: Catherine Hock H. 10/29/2021 1:30 PM Medical Record Number: [id number] Patient Account Number: [id number] Date of Birth/Sex: Treating RN: [birth year] (57 y.o. Debara Pickett, Millard.Loa Primary Care Provider: Jerre Simon Other Clinician: Referring Provider: Treating Provider/Extender: Gearldine Shown in Treatment: 27 Active Problems ICD-10 Encounter Code Description Active Date MDM Diagnosis L97.812 Non-pressure chronic ulcer of other part of right lower leg with fat layer 04/20/2021 No Yes exposed L97.822 Non-pressure chronic ulcer of other part of left lower leg with fat layer exposed5/18/2023 No Yes I87.313 Chronic venous hypertension (idiopathic) with ulcer of bilateral lower extremity 08/02/2021 No Yes I89.0 Lymphedema, not elsewhere classified 04/20/2021 No Yes Z99.3 Dependence on wheelchair 04/20/2021 No Yes E66.01 Morbid (severe) obesity due to excess calories 04/20/2021 No Yes Inactive Problems Resolved Problems ICD-10 Code Description Active Date Resolved Date B37.31 Acute candidiasis of vulva and vagina 05/04/2021 05/04/2021 Electronic Signature(s) Signed: 10/29/2021 2:26:36 PM By: Geralyn Corwin DO Entered By: Geralyn Corwin on 10/29/2021 14:20:33 -------------------------------------------------------------------------------- Progress Note Details Patient Name: Date of Service: Catherine Hock H. 10/29/2021 1:30 PM Medical Record Number: [id number] Patient Account Number: [id number] Date of Birth/Sex: Treating RN: 05-13-1965 (56 y.o. F) Primary Care Provider: Jerre Simon Other Clinician: Referring Provider: Treating Provider/Extender: Gearldine Shown in Treatment: 27 Subjective Chief Complaint Information obtained from Patient Right posterior leg wound History of Present  Illness (HPI) Admission 04/20/2021 Ms. Kandra Graven is a 56 year old female with a past medical history of morbid obesity with BMI of 66 and wheelchair dependent due to this, lymphedema and hypertension that presents to the clinic for a 6-month history of nonhealing ulcer to the right calf. She states this started spontaneously and  has been using Unna boots and Xeroform with benefit She saw vein and vascular who recommended bariatric surgery. She currently denies systemic signs of infection. 2/17; patient presents for follow-up. She tolerated the compression wrap well. She does report increased tenderness to the surrounding wound bed. 2/24; patient presents for follow-up. She reports taking Keflex with improvement in her symptoms. She has developed some vaginal itching and reports a history of yeast infection after taking antibiotics. She has her juxta lite compression today. 3/27; patient presents for follow-up. Patient states that she has had 2 areas open up to the right lower extremity over the past week. She was started on Keflex by her primary care physician for potential cellulitis. She has been using her juxta light compression daily without issues. She also has a compression stocking to the left lower extremity that she has been using since discharge from clinic on 2/24. 4/4; patient presents for follow-up. She reports that the compression wrap placed in clinic last visit slid down the following day. She has been using silver alginate to the wound site and using her juxta lite compression daily. She has no issues or complaints today. She would like a note to be out of work so that she can elevate her legs to help heal the wounds. 4/11; the patient arrives today with the area on her right posterior calf totally closed. She has severe lymphedema but has juxta lite stockings and new compression pumps arrive this morning she has not yet used them. 4/26; patient presents with reopening of her  previous wounds on the right posterior calf Over the past week. She states she has been using her juxta lite compressions and lymphedema pumps. She is not on a diuretic. 5/18; patient presents for follow-up. She continues to have a wound to the right posterior calf and has developed new wounds to the left lower extremity. She states these happened spontaneously over the past week. She has a juxta light compression however has not been wearing this to the left lower extremity. She has been wearing her juxta lite to the right lower extremity. She denies signs of infection. 5/25; patient presents for follow-up. The compression wrap slid off 1 to 2 days after it was placed in office last week. She has been using silver alginate daily to the wound beds. She has also been wearing her juxta light compression. She currently denies signs of infection. 6/8; patient with bilateral lymphedema wounds on the right greater than left posterior calf. In fact the areas on the left are close to being closed. There is some degree of venous inflammation and skin changes from chronic edema. She is using Iodoflex to the wounds She has pumps at home that she is using twice a day. She has an appointment to make custom made stockings. Apparently the juxta lites are not staying on very well and even our compression wraps do not stay up for the patient 6/27; patient presents for follow-up. She has been using Iodosorb to the wound beds. Along with her juxta lite compressions daily. She states she has been using her lymphedema pumps 3 times daily as well. Unfortunately she does not currently have health insurance. She has not been able to get her compression garments fitted due to this reason. She currently wants to remain out of work to help these wounds heal. She has no issues or complaints today. 7/11; patient presents for follow-up. She has been using Iodosorb to the wound beds. She uses her juxta lite compressions daily.  One  of the wounds on the right posterior leg is healed. She would like to return back to work. At this time she does not want to obtain her custom compression garments due to financial reasons. 7/25; patient presents for follow-up. We have been using Iodoflex to the wound beds. Her wounds are healed today. She has her juxta lite compressions with her. She states she is going to try and get her custom compression garments. At this time she can return back to work full-time. 8/7; patient presents for follow-up. At last clinic visit the patient was discharged due to closed wound. She has developed a new wound to the posterior right leg limited to skin breakdown. She has been using hydrogen peroxide in her juxta light. She states she is using her lymphedema pumps several times a day. She would like a note to be out of work. 8/21; patient presents for follow-up. She has been using Iodosorb to the wound bed along with her compression Velcro wraps. She has no open wounds to the left lower extremity. Patient History Information obtained from Patient. Family History Unknown History. Social History Never smoker, Marital Status - Married, Alcohol Use - Never, Drug Use - No History, Caffeine Use - Daily - coffee. Medical History Hematologic/Lymphatic Patient has history of Lymphedema Respiratory Patient has history of Asthma Cardiovascular Patient has history of Hypertension, Peripheral Venous Disease Musculoskeletal Patient has history of Osteoarthritis Hospitalization/Surgery History - knee right arthoscopy. - appendectomy. - cardiac cath. - cervical fusion. - cholecystectomy. - wisdom tooth. Medical A Surgical History Notes nd Hematologic/Lymphatic Hypercholesterolemia Gastrointestinal GERD Musculoskeletal Degenerative joint disease of knees Objective Constitutional respirations regular, non-labored and within target range for patient.. Vitals Time Taken: 1:58 PM, Height: 68 in, Weight: 430  lbs, BMI: 65.4, Temperature: 98.3 F, Pulse: 80 bpm, Respiratory Rate: 20 breaths/min, Blood Pressure: 169/83 mmHg. Cardiovascular 2+ dorsalis pedis/posterior tibialis pulses. Psychiatric pleasant and cooperative. General Notes: Right lower extremity: T the posterior aspect there are 2 small areas limited to skin breakdown. Significant bilateral lymphedema. o Integumentary (Hair, Skin) Wound #8 status is Open. Original cause of wound was Gradually Appeared. The date acquired was: 10/08/2021. The wound has been in treatment 2 weeks. The wound is located on the Right,Posterior Lower Leg. The wound measures 1.5cm length x 1.5cm width x 0.2cm depth; 1.767cm^2 area and 0.353cm^3 volume. There is Fat Layer (Subcutaneous Tissue) exposed. There is no tunneling or undermining noted. There is a medium amount of serosanguineous drainage noted. The wound margin is distinct with the outline attached to the wound base. There is large (67-100%) red granulation within the wound bed. There is no necrotic tissue within the wound bed. Assessment Active Problems ICD-10 Non-pressure chronic ulcer of other part of right lower leg with fat layer exposed Non-pressure chronic ulcer of other part of left lower leg with fat layer exposed Chronic venous hypertension (idiopathic) with ulcer of bilateral lower extremity Lymphedema, not elsewhere classified Dependence on wheelchair Morbid (severe) obesity due to excess calories Patient's right posterior leg wound appears well-healing. I recommended switching Iodosorb to collagen. She can continue to continue her Velcro wraps daily to both lower extremities. Continue lymphedema pumps. She may return to work without restrictions. Follow-up in 3 weeks. She knows to call with any questions or concerns. Plan Follow-up Appointments: Return appointment in 3 weeks. - Dr. Mikey Bussing and [place], Room 8 130pm 11/19/2021 Monday Other: - Patient could go back to work for now. Edema  Control - Lymphedema / SCD / Other: Lymphedema  Pumps. Use Lymphedema pumps on leg(s) 2-3 times a day for 45-60 minutes. If wearing any wraps or hose, do not remove them. Continue exercising as instructed. - 2 times a day for an hour each time lymphedema pumps. while working pump before, after work, and at bedtime. Elevate legs to the level of the heart or above for 30 minutes daily and/or when sitting, a frequency of: - throughout the day. Avoid standing for long periods of time. Exercise regularly Moisturize legs daily. - apply left leg every night before bed. Compression stocking or Garment 30-40 mm/Hg pressure to: - Apply juxtalite HD in the morning and remove at night to both legs. WOUND #8: - Lower Leg Wound Laterality: Right, Posterior Cleanser: Soap and Water 3 x Per Day/30 Days Discharge Instructions: May shower and wash wound with dial antibacterial soap and water prior to dressing change. Cleanser: Wound Cleanser 3 x Per Day/30 Days Discharge Instructions: Cleanse the wound with wound cleanser prior to applying a clean dressing using gauze sponges, not tissue or cotton balls. Prim Dressing: Fibracol Plus Dressing, 4x4.38 in (collagen) 3 x Per Day/30 Days ary Discharge Instructions: Moisten collagen with saline or hydrogel Secondary Dressing: bandaid 3 x Per Day/30 Days Discharge Instructions: bandaid 1. Collagen 2. Juxta light Velcro wraps daily 3. Follow-up in 3 weeks Electronic Signature(s) Signed: 10/29/2021 2:26:36 PM By: Geralyn CorwinHoffman, Artavious Trebilcock DO Entered By: Geralyn CorwinHoffman, Dexter Sauser on 10/29/2021 14:25:18 -------------------------------------------------------------------------------- HxROS Details Patient Name: Date of Service: Catherine Powell, Catherine ERLY H. 10/29/2021 1:30 PM Medical Record Number: [id number] Patient Account Number: [id number] Date of Birth/Sex: Treating RN: 01/29/1966 (56 y.o. F) Primary Care Provider: Jerre SimonNorbert, John Other Clinician: Referring Provider: Treating  Provider/Extender: Gearldine ShownHoffman, Rorik Vespa Norbert, John Weeks in Treatment: 27 Information Obtained From Patient Hematologic/Lymphatic Medical History: Positive for: Lymphedema Past Medical History Notes: Hypercholesterolemia Respiratory Medical History: Positive for: Asthma Cardiovascular Medical History: Positive for: Hypertension; Peripheral Venous Disease Gastrointestinal Medical History: Past Medical History Notes: GERD Musculoskeletal Medical History: Positive for: Osteoarthritis Past Medical History Notes: Degenerative joint disease of knees Immunizations Pneumococcal Vaccine: Received Pneumococcal Vaccination: No Implantable Devices No devices added Hospitalization / Surgery History Type of Hospitalization/Surgery knee right arthoscopy appendectomy cardiac cath cervical fusion cholecystectomy wisdom tooth Family and Social History Unknown History: Yes; Never smoker; Marital Status - Married; Alcohol Use: Never; Drug Use: No History; Caffeine Use: Daily - coffee; Financial Concerns: No; Food, Clothing or Shelter Needs: No; Support System Lacking: No; Transportation Concerns: No Electronic Signature(s) Signed: 10/29/2021 2:26:36 PM By: Geralyn CorwinHoffman, Aliannah Holstrom DO Entered By: Geralyn CorwinHoffman, Kayson Tasker on 10/29/2021 14:23:43 -------------------------------------------------------------------------------- SuperBill Details Patient Name: Date of Service: Catherine Powell, Catherine ERLY H. 10/29/2021 Medical Record Number: [id number] Patient Account Number: [id number] Date of Birth/Sex: Treating RN: 11/04/1965 (56 y.o. Debara PickettF) [name], Millard.LoaBobbi Primary Care Provider: Jerre SimonNorbert, John Other Clinician: Referring Provider: Treating Provider/Extender: Gearldine ShownHoffman, Salene Mohamud Norbert, John Weeks in Treatment: 27 Diagnosis Coding ICD-10 Codes Code Description [id number] Non-pressure chronic ulcer of other part of right lower leg with fat layer exposed L97.822 Non-pressure chronic ulcer of other part of left lower leg  with fat layer exposed I87.313 Chronic venous hypertension (idiopathic) with ulcer of bilateral lower extremity I89.0 Lymphedema, not elsewhere classified Z99.3 Dependence on wheelchair E66.01 Morbid (severe) obesity due to excess calories Facility Procedures CPT4 Code: [id number] Description: 99213 - WOUND CARE VISIT-LEV 3 EST PT Modifier: Quantity: 1 Physician Procedures : CPT4 Code Description Modifier [id number] 99213 - WC PHYS LEVEL 3 - EST PT ICD-10 Diagnosis Description L97.812 Non-pressure chronic ulcer of other part of right lower  leg with fat layer exposed L97.822 Non-pressure chronic ulcer of other part of left  lower leg with fat layer exposed I87.313 Chronic venous hypertension (idiopathic) with ulcer of bilateral lower extremity E66.01 Morbid (severe) obesity due to excess calories Quantity: 1 Electronic Signature(s) Signed: 10/29/2021 2:26:36 PM By: Kalman Shan DO Entered By: Kalman Shan on 10/29/2021 14:25:34

## 2021-10-30 ENCOUNTER — Ambulatory Visit: Payer: Self-pay

## 2021-10-30 DIAGNOSIS — I890 Lymphedema, not elsewhere classified: Secondary | ICD-10-CM

## 2021-10-30 DIAGNOSIS — Z789 Other specified health status: Secondary | ICD-10-CM

## 2021-10-30 NOTE — Progress Notes (Addendum)
Catherine, Powell ([id number].pdf Page 1 of 8 Visit Report for 10/29/2021 Arrival Information Details Patient Name: Date of Service: Catherine Powell 10/29/2021 1:30 PM Medical Record Number: [id number] Patient Account Number: [id number] Date of Birth/Sex: Treating RN: 11-01-1965 (56 y.o. Catherine Powell, Catherine Powell Primary Care Monnie Anspach: Alen Bleacher Other Clinician: Referring Geovani Tootle: Treating Isaly Fasching/Extender: Jannet Askew in Treatment: 27 Visit Information History Since Last Visit Added or deleted any medications: No Patient Arrived: Wheel Chair Any new allergies or adverse reactions: No Arrival Time: 13:59 Had a fall or experienced change in No Accompanied By: son activities of daily living that may affect Transfer Assistance: None risk of falls: Patient Identification Verified: Yes Signs or symptoms of abuse/neglect since last visito No Secondary Verification Process Completed: Yes Hospitalized since last visit: No Patient Requires Transmission-Based Precautions: No Implantable device outside of the clinic excluding No Patient Has Alerts: No cellular tissue based products placed in the center since last visit: Has Dressing in Place as Prescribed: Yes Has Compression in Place as Prescribed: Yes Pain Present Now: Yes Electronic Signature(s) Signed: 10/29/2021 4:49:28 PM By: Deon Pilling RN, BSN Entered By: Deon Pilling on 10/29/2021 13:59:56 -------------------------------------------------------------------------------- Clinic Level of Care Assessment Details Patient Name: Date of Service: Catherine Powell 10/29/2021 1:30 PM Medical Record Number: [id number] Patient Account Number: [id number] Date of Birth/Sex: Treating RN: 06-22-1965 (56 y.o. Catherine Powell Primary Care Arvella Massingale: Alen Bleacher Other Clinician: Referring Trishna Cwik: Treating Jami Ohlin/Extender: Jannet Askew in Treatment:  27 Clinic Level of Care Assessment Items TOOL 4 Quantity Score X- 1 0 Use when only an EandM is performed on FOLLOW-UP visit ASSESSMENTS - Nursing Assessment / Reassessment X- 1 10 Reassessment of Co-morbidities (includes updates in patient status) X- 1 5 Reassessment of Adherence to Treatment Plan ASSESSMENTS - Wound and Skin A ssessment / Reassessment X - Simple Wound Assessment / Reassessment - one wound 1 5 []  - 0 Complex Wound Assessment / Reassessment - multiple wounds []  - 0 Dermatologic / Skin Assessment (not related to wound area) ASSESSMENTS - Focused Assessment X- 1 5 Circumferential Edema Measurements - multi extremities X- 1 10 Nutritional Assessment / Counseling / Intervention Catherine, Powell ([id number].pdf Page 2 of 8 []  - 0 Lower Extremity Assessment (monofilament, tuning fork, pulses) []  - 0 Peripheral Arterial Disease Assessment (using hand held doppler) ASSESSMENTS - Ostomy and/or Continence Assessment and Care []  - 0 Incontinence Assessment and Management []  - 0 Ostomy Care Assessment and Management (repouching, etc.) PROCESS - Coordination of Care X - Simple Patient / Family Education for ongoing care 1 15 []  - 0 Complex (extensive) Patient / Family Education for ongoing care X- 1 10 Staff obtains Programmer, systems, Records, T Results / Process Orders est []  - 0 Staff telephones HHA, Nursing Homes / Clarify orders / etc []  - 0 Routine Transfer to another Facility (non-emergent condition) []  - 0 Routine Hospital Admission (non-emergent condition) []  - 0 New Admissions / Biomedical engineer / Ordering NPWT Apligraf, etc. , []  - 0 Emergency Hospital Admission (emergent condition) X- 1 10 Simple Discharge Coordination []  - 0 Complex (extensive) Discharge Coordination PROCESS - Special Needs []  - 0 Pediatric / Minor Patient Management []  - 0 Isolation Patient Management []  - 0 Hearing / Language / Visual  special needs []  - 0 Assessment of Community assistance (transportation, D/C planning, etc.) []  - 0 Additional assistance / Altered mentation []  - 0 Support Surface(s) Assessment (bed, cushion, seat, etc.) INTERVENTIONS -  Wound Cleansing / Measurement X - Simple Wound Cleansing - one wound 1 5 []  - 0 Complex Wound Cleansing - multiple wounds X- 1 5 Wound Imaging (photographs - any number of wounds) []  - 0 Wound Tracing (instead of photographs) X- 1 5 Simple Wound Measurement - one wound []  - 0 Complex Wound Measurement - multiple wounds INTERVENTIONS - Wound Dressings X - Small Wound Dressing one or multiple wounds 1 10 []  - 0 Medium Wound Dressing one or multiple wounds []  - 0 Large Wound Dressing one or multiple wounds []  - 0 Application of Medications - topical []  - 0 Application of Medications - injection INTERVENTIONS - Miscellaneous []  - 0 External ear exam []  - 0 Specimen Collection (cultures, biopsies, blood, body fluids, etc.) []  - 0 Specimen(s) / Culture(s) sent or taken to Lab for analysis []  - 0 Patient Transfer (multiple staff / / Similar devices) []  - 0 Simple Staple / Suture removal (25 or less) []  - 0 Complex Staple / Suture removal (26 or more) []  - 0 Hypo / Hyperglycemic Management (close monitor of Blood Glucose) [employer], [place] H (  .pdf Page 3 of 8 []  - 0 Ankle / Brachial Index (ABI) - do not check if billed separately X- 1 5 Vital Signs Has the patient been seen at the hospital within the last three years: Yes Total Score: 100 Level Of Care: New/Established - Level 3 Electronic Signature(s) Signed: 10/29/2021 4:49:28 PM By: RN, BSN Entered By: on 10/29/2021 14:19:47 -------------------------------------------------------------------------------- Encounter Discharge Information Details Patient Name: Date of Service: H. 10/29/2021 1:30  PM Medical Record Number: Patient Account Number: Date of Birth/Sex: Treating RN: 1965-06-17 (56 y.o. Primary Care Catherine Powell: Other Clinician: Referring Jodye Scali: Treating Misk Galentine/Extender: [id number] in Treatment: 27 Encounter Discharge Information Items Discharge Condition: Stable Ambulatory Status: Wheelchair Discharge Destination: Home Transportation: Private Auto Accompanied By: son Schedule Follow-up Appointment: Yes Clinical Summary of Care: Electronic Signature(s) Signed: 10/29/2021 4:49:28 PM By: 10/31/2021 RN, BSN Entered By: Shawn Stall on 10/29/2021 14:20:24 -------------------------------------------------------------------------------- Lower Extremity Assessment Details Patient Name: Date of Service: 10/31/2021 H. 10/29/2021 1:30 PM Medical Record Number: 10/31/2021 Patient Account Number: [id number] Date of Birth/Sex: Treating RN: 24-Oct-1965 (56 y.o. 58 Primary Care Emlyn Maves: [name] Other Clinician: Referring Sorren Vallier: Treating Eloina Ergle/Extender: Jerre Simon in Treatment: 27 Edema Assessment Assessed: [Left: No] [Right: Yes] Edema: [Left: Yes] [Right: Yes] Calf Left: Right: Point of Measurement: From Medial Instep 78 cm Ankle Left: Right: Point of Measurement: From Medial Instep 42 cm Vascular Assessment Left: [[id number] Page 4 of 8Right:] Pulses: Dorsalis Pedis Palpable: [[id number] Page 4 of 8No] Electronic Signature(s) Signed: 10/29/2021 4:49:28 PM By: Shawn Stall RN, BSN Entered By: 10/31/2021 on 10/29/2021 14:03:35 -------------------------------------------------------------------------------- Multi Wound Chart Details Patient Name: Date of Service: 10/31/2021 H. 10/29/2021 1:30 PM Medical Record Number: [id number] Patient Account Number:  13/09/1965 Date of Birth/Sex: Treating RN: August 16, 1965 (56 y.o. F) Primary Care Eldora Napp: Jerre Simon Other Clinician: Referring Liese Dizdarevic: Treating Vardaan Depascale/Extender: Gearldine Shown in Treatment: 27 Vital Signs Height(in): 68 Pulse(bpm): 80 Weight(lbs): 430 Blood Pressure(mmHg): 169/83 Body Mass Index(BMI): 65.4 Temperature(F): 98.3 Respiratory Rate(breaths/min): 20 [8:Photos:] [[name]:[name]] Right, Posterior Lower Leg [name] [name] Wound Location: Gradually Appeared [name] [name] Wounding Event: Lymphedema [name] [name] Primary Etiology: Lymphedema, Asthma, Hypertension, [name] [name] Comorbid History: Peripheral Venous Disease, Osteoarthritis 10/08/2021 [name] [name] Date  Acquired: 2 [name] [name] Weeks of Treatment: Open [name] [name] Wound Status: No [name] [name] Wound Recurrence: Yes [name] [name] Clustered Wound: 2 [name] [name] Clustered Quantity: 1.5x1.5x0.2 [name] [name] Measurements L x W x D (cm) 1.767 [name] [name] A (cm) : rea 0.353 [name] [name] Volume (cm) : -1779.80% [name] [name] % Reduction in Area: -1757.90% [name] [name] % Reduction in Volume: Full Thickness Without Exposed [name] [name] Classification: Support Structures Medium [name] [name] Exudate Amount: Serosanguineous [name] [name] Exudate Type: red, brown [name] [name] Exudate Color: Distinct, outline attached [name] [name] Wound Margin: Large (67-100%) [name] [name] Granulation Amount: Red [name] [name] Granulation Quality: None Present (0%) [name] [name] Necrotic Amount: Fat Layer (Subcutaneous Tissue): Yes [name] [name] Exposed Structures: Fascia: No Tendon: No Muscle: No Joint: No Bone: No Large (67-100%) [name] [name] EpithelializationALBINA, PERETZ H ([id number].pdf Page 5 of 8 Treatment Notes Wound #8 (Lower Leg) Wound Laterality: Right, Posterior Cleanser Soap and Water Discharge Instruction: May shower and wash wound with dial antibacterial soap and water prior to dressing change. Wound Cleanser Discharge Instruction: Cleanse the wound  with wound cleanser prior to applying a clean dressing using gauze sponges, not tissue or cotton balls. Peri-Wound Care Topical Primary Dressing Fibracol Plus Dressing, 4x4.38 in (collagen) Discharge Instruction: Moisten collagen with saline or hydrogel Secondary Dressing bandaid Discharge Instruction: bandaid Secured With Compression Wrap Compression Stockings Add-Ons Electronic Signature(s) Signed: 10/29/2021 2:26:36 PM By: Kalman Shan DO Entered By: Kalman Shan on 10/29/2021 14:20:37 -------------------------------------------------------------------------------- [place] Details Patient Name: Date of Service: [name] 10/29/2021 1:30 PM Medical Record Number: [id number] Patient Account Number: [id number] Date of Birth/Sex: Treating RN: 30-Jun-1965 (56 y.o. Catherine Powell Primary Care Quadre Bristol: Alen Bleacher Other Clinician: Referring Delaine Canter: Treating Saul Dorsi/Extender: Jannet Askew in Treatment: [place] reviewed with physician Active Inactive Electronic Signature(s) Signed: 12/21/2021 2:21:27 PM By: Deon Pilling RN, BSN Previous Signature: 10/29/2021 4:49:28 PM Version By: Deon Pilling RN, BSN Entered By: Deon Pilling on 12/21/2021 14:21:26 -------------------------------------------------------------------------------- Pain Assessment Details Patient Name: Date of Service: Catherine Powell. 10/29/2021 1:30 PM Medical Record Number: [id number] Patient Account Number: [id number] Date of Birth/Sex: Treating RN: 11/19/1965 (56 y.o. Catherine Powell Cum[place], Bolivar Haw ([id number]) [id number] Page 6 of 8 Primary Care Donnette Macmullen: Alen Bleacher Other Clinician: Referring Keilynn Marano: Treating Chakia Counts/Extender: Jannet Askew in Treatment: 27 Active Problems Location of Pain Severity and Description of Pain Patient Has Paino Yes Site  Locations Pain Location: Generalized Pain Rate the pain. Current Pain Level: 6 Pain Management and Medication Current Pain Management: Medication: No Cold Application: No Rest: No Massage: No Activity: No T.E.N.S.: No Heat Application: No Leg drop or elevation: No Is the Current Pain Management Adequate: Adequate How does your wound impact your activities of daily livingo Sleep: No Bathing: No Appetite: No Relationship With Others: No Bladder Continence: No Emotions: No Bowel Continence: No Work: No Toileting: No Drive: No Dressing: No Hobbies: No Electronic Signature(s) Signed: 10/29/2021 4:49:28 PM By: Deon Pilling RN, BSN Entered By: Deon Pilling on 10/29/2021 14:00:28 -------------------------------------------------------------------------------- Patient/Caregiver Education Details Patient Name: Date of Service: Catherine Powell 8/21/2023andnbsp1:30 PM Medical Record Number: [id number] Patient Account Number: [id number] Date of Birth/Gender: Treating RN: 1965/03/18 (56 y.o. Catherine Powell Primary Care Physician: Alen Bleacher Other Clinician: Referring Physician: Treating Physician/Extender: Jannet Askew in Treatment: 27 Education Assessment Education Provided To: Patient Education Topics Provided Wound/Skin Impairment: Handouts: Skin Care Do's and Dont's Methods:  Explain/Verbal VALI, CAPANO ([id number]) [id number] Page 7 of 8 Responses: Reinforcements needed Electronic Signature(s) Signed: 10/29/2021 4:49:28 PM By: Shawn Stall RN, BSN Entered By: Shawn Stall on 10/29/2021 13:50:11 -------------------------------------------------------------------------------- Wound Assessment Details Patient Name: Date of Service: Maudie Flakes. 10/29/2021 1:30 PM Medical Record Number: [id number] Patient Account Number: [id number] Date of Birth/Sex: Treating RN: 06/22/1965 (56 y.o. Debara Pickett,  Millard.Loa Primary Care Demaris Leavell: Jerre Simon Other Clinician: Referring Dreden Rivere: Treating Thedford Bunton/Extender: Gearldine Shown in Treatment: 27 Wound Status Wound Number: 8 Primary Lymphedema Etiology: Wound Location: Right, Posterior Lower Leg Wound Open Wounding Event: Gradually Appeared Status: Date Acquired: 10/08/2021 Comorbid Lymphedema, Asthma, Hypertension, Peripheral Venous Weeks Of Treatment: 2 History: Disease, Osteoarthritis Clustered Wound: Yes Photos Wound Measurements Length: (cm) Width: (cm) Depth: (cm) Clustered Quantity: Area: (cm) Volume: (cm) 1.5 % Reduction in Area: -1779.8% 1.5 % Reduction in Volume: -1757.9% 0.2 Epithelialization: Large (67-100%) 2 Tunneling: No 1.767 Undermining: No 0.353 Wound Description Classification: Full Thickness Without Exposed Sup Wound Margin: Distinct, outline attached Exudate Amount: Medium Exudate Type: Serosanguineous Exudate Color: red, brown port Structures Foul Odor After Cleansing: No Slough/Fibrino No Wound Bed Granulation Amount: Large (67-100%) Exposed Structure Granulation Quality: Red Fascia Exposed: No Necrotic Amount: None Present (0%) Fat Layer (Subcutaneous Tissue) Exposed: Yes Tendon Exposed: No Muscle Exposed: No Joint Exposed: No Bone Exposed: No Electronic Signature(s) Signed: 10/29/2021 4:49:28 PM By: Shawn Stall RN, BSN [name] ([id number]) [id number] Page 8 of 8 Signed: 10/30/2021 3:41:21 PM By: [name] RN Entered By: [name] on 10/29/2021 14:08:09 -------------------------------------------------------------------------------- Vitals Details Patient Name: Date of Service: [name] 10/29/2021 1:30 PM Medical Record Number: [id number] Patient Account Number: [id number] Date of Birth/Sex: Treating RN: [birth year] (56 y.o. Debara Pickett, Millard.Loa Primary Care Theadora Noyes: Jerre Simon Other  Clinician: Referring Casady Voshell: Treating Siddharth Babington/Extender: Gearldine Shown in Treatment: 27 Vital Signs Time Taken: 13:58 Temperature (F): 98.3 Height (in): 68 Pulse (bpm): 80 Weight (lbs): 430 Respiratory Rate (breaths/min): 20 Body Mass Index (BMI): 65.4 Blood Pressure (mmHg): 169/83 Reference Range: 80 - 120 mg / dl Electronic Signature(s) Signed: 10/29/2021 4:49:28 PM By: Shawn Stall RN, BSN Entered By: Shawn Stall on 10/29/2021 14:00:13

## 2021-10-31 NOTE — Patient Outreach (Addendum)
  Care Coordination   Initial Visit Note   10/30/21 Name: Catherine Powell MRN: [id number] DOB: 1965-04-14  Catherine Powell is a 56 y.o. year old female who sees Catherine Simon, MD for primary care. I spoke with  Catherine Powell by phone today  What matters to the patients health and wellness today?  I need help with my bills    Goals Addressed               This Visit's Progress     I needd help witgh my bills (pt-stated)        Care Coordination Interventions: Evaluation of current treatment plan related to patient's adherence to plan as established by provider Reviewed medications with patient and discussed Discussed plans with patient for ongoing care management follow up and provided patient with direct contact information for care management team Active listening / Reflection utilized  Emotional Support Provided Provided psychoeducation for mental health needs  The patient, who is unable to drive and lacks income and insurance, needs assistance with transportation and food due to her disabled husband. Despite wound clinic treatments, her daily tasks are aided by her son, and she wishes to apply for disability. I explained that the process can be lengthy and provided the number to social services Phone: [phone]. I also sent a referral to the care guides for transportation and food, and suggested she apply for [facility] financial to help with her bills if approved.        SDOH assessments and interventions completed:  Yes  SDOH Interventions Today    Flowsheet Row Most Recent Value  SDOH Interventions   Food Insecurity Interventions --  [Care guides]  Housing Interventions Intervention Not Indicated  Transportation Interventions --  [Care quides the patient lost her job andhsa no insurance and has special needs for doctors visit.]        Care Coordination Interventions Activated:  Yes  Care Coordination Interventions:  Yes, provided   Follow up plan:  Follow up  call scheduled for 9/5 at 215 pm  Encounter Outcome:  Pt. Visit Completed   [name] RN, BSN, [facility] Care Coordinator [facility]   Phone: [phone]

## 2021-10-31 NOTE — Patient Instructions (Signed)
Visit Information  Thank you for taking time to visit with me today. Please don't hesitate to contact me if I can be of assistance to you.   Following are the goals we discussed today:   Goals Addressed               This Visit's Progress     I needd help witgh my bills (pt-stated)        Care Coordination Interventions: Evaluation of current treatment plan related to patient's adherence to plan as established by provider Reviewed medications with patient and discussed Discussed plans with patient for ongoing care management follow up and provided patient with direct contact information for care management team Active listening / Reflection utilized  Emotional Support Provided Provided psychoeducation for mental health needs  The patient, who is unable to drive and lacks income and insurance, needs assistance with transportation and food due to her disabled husband. Despite wound clinic treatments, her daily tasks are aided by her son, and she wishes to apply for disability. I explained that the process can be lengthy and provided the number to social services Phone: [phone]. I also sent a referral to the care guides for transportation and food, and suggested she apply for [facility] to help with her bills if approved.        Our next appointment is by telephone on 9/5 at 215 pm  Please call the care guide team at [phone] if you need to cancel or reschedule your appointment.   If you are experiencing a Mental Health or Behavioral Health Crisis or need someone to talk to, please call 1-800-273-TALK (toll free, 24 hour hotline)  Patient verbalizes understanding of instructions and care plan provided today and agrees to view in MyChart. Active MyChart status and patient understanding of how to access instructions and care plan via MyChart confirmed with patient.     [name] RN, BSN, [facility] Care Coordinator [facility]   Phone: [phone]

## 2021-11-01 ENCOUNTER — Telehealth: Payer: Self-pay | Admitting: *Deleted

## 2021-11-01 NOTE — Telephone Encounter (Signed)
   Telephone encounter was:  Successful.  11/01/2021 Name: Catherine Powell MRN: [id number] DOB: 09-Aug-1965  Catherine Powell is a 56 y.o. year old female who is a primary care patient of Jerre Simon, MD . The community resource team was consulted for assistance with Transportation Needs , Food Insecurity, Home Modifications, and Caregiver Stress  Care guide performed the following interventions: Patient provided with information about care guide support team and interviewed to confirm resource needs Follow up call placed to community resources to determine status of patients referral. patient out of work since november needs to file for disablilty and needs food resources and insurance will email her all information also put in  369 referral  Follow Up Plan:  No further follow up planned at this time. The patient has been provided with needed resources.  Yehuda Mao [facility] -[facility] [place], Population Health [phone] [address] [place] , [place] [address] Email : [email or web address]

## 2021-11-05 ENCOUNTER — Other Ambulatory Visit: Payer: Self-pay

## 2021-11-05 MED ORDER — HYDROCODONE-ACETAMINOPHEN 5-325 MG PO TABS: 1.0000 | ORAL_TABLET | Freq: Two times a day (BID) | ORAL | 0 refills | Status: DC | PRN

## 2021-11-06 ENCOUNTER — Telehealth: Payer: Self-pay

## 2021-11-06 NOTE — Telephone Encounter (Signed)
Patient calls nurse line requesting a letter with an estimated return to work date.   Previous FMLA paperwork had return to work date as undetermined. Patient reports that a date is required.   She is requesting a letter stating that her estimated return to work date will be on 9/27, after visit with PCP. Return to work can then be adjusted as needed at this visit.   She is requesting that this letter be sent through [facility] [employer] if possible.   Forwarding to PCP.   Veronda Prude, RN

## 2021-11-07 ENCOUNTER — Encounter: Payer: Self-pay | Admitting: Student

## 2021-11-08 ENCOUNTER — Encounter: Payer: Self-pay | Admitting: Student

## 2021-11-08 ENCOUNTER — Telehealth: Payer: Self-pay

## 2021-11-08 NOTE — Telephone Encounter (Signed)
Dr. Felicita Gage calls nurse line in regards to patients disability.   He reports if PCP has any questions or concerns he can reach out to Dr. Dione Plover for assistance.   [phone]  It is my understanding that patient is working on getting disability at this time.   Will forward to PCP.

## 2021-11-13 ENCOUNTER — Ambulatory Visit: Payer: Self-pay

## 2021-11-13 NOTE — Patient Instructions (Signed)
Visit Information  Thank you for taking time to visit with me today. Please don't hesitate to contact me if I can be of assistance to you.   Following are the goals we discussed today:   Goals Addressed               This Visit's Progress     COMPLETED: I needd help with my bills (pt-stated)        Care Coordination Interventions: Evaluation of current treatment plan related to patient's adherence to plan as established by provider Reviewed medications with patient and discussed Discussed plans with patient for ongoing care management follow up and provided patient with direct contact information for care management team Active listening / Reflection utilized  Emotional Support Provided Provided psychoeducation for mental health needs  The patient, who is unable to drive and lacks income and insurance, needs assistance with transportation and food due to her disabled husband. Despite wound clinic treatments, her daily tasks are aided by her son, and she wishes to apply for disability. I explained that the process can be lengthy and provided the number to social services Phone: [phone]. I also sent a referral to the care guides for transportation and food, and suggested she apply for [facility] financial to help with her bills if approved. Reviewed EMR and seen that the patient was contacted by care guides and also spoke with the office regarding disability. Left a message that today will be my last call.       Patient verbalizes understanding of instructions and care plan provided today and agrees to view in MyChart. Active MyChart status and patient understanding of how to access instructions and care plan via MyChart confirmed with patient.     Catherine Fairly RN, BSN, [facility] Care Coordinator [facility]   Phone: [phone]

## 2021-11-13 NOTE — Patient Outreach (Signed)
  Care Coordination   11/13/2021 Name: Catherine Powell MRN: [id number] DOB: 1965-07-24   Care Coordination Outreach Attempts:  An unsuccessful telephone outreach was attempted today to offer the patient information about available care coordination services as a benefit of their health plan.   Follow Up Plan:  No further outreach attempts will be made at this time. We have been unable to contact the patient to offer or enroll patient in care coordination services  Encounter Outcome:  Pt. Visit Completed  Care Coordination Interventions Activated:  No   Care Coordination Interventions:  No, not indicated   [name] RN, BSN, [facility] Care Coordinator [facility]   Phone: [phone]

## 2021-11-19 ENCOUNTER — Encounter (HOSPITAL_BASED_OUTPATIENT_CLINIC_OR_DEPARTMENT_OTHER): Payer: Self-pay | Admitting: Internal Medicine

## 2021-11-30 ENCOUNTER — Encounter: Payer: Self-pay | Admitting: Student

## 2021-12-04 ENCOUNTER — Ambulatory Visit: Payer: Self-pay | Admitting: Student

## 2021-12-11 ENCOUNTER — Ambulatory Visit (INDEPENDENT_AMBULATORY_CARE_PROVIDER_SITE_OTHER): Payer: Self-pay | Admitting: Student

## 2021-12-11 ENCOUNTER — Telehealth: Payer: Self-pay | Admitting: *Deleted

## 2021-12-11 ENCOUNTER — Encounter: Payer: Self-pay | Admitting: Student

## 2021-12-11 VITALS — BP 118/72 | HR 85 | Ht 68.0 in | Wt >= 6400 oz

## 2021-12-11 DIAGNOSIS — G8929 Other chronic pain: Secondary | ICD-10-CM

## 2021-12-11 DIAGNOSIS — E7800 Pure hypercholesterolemia, unspecified: Secondary | ICD-10-CM

## 2021-12-11 DIAGNOSIS — I890 Lymphedema, not elsewhere classified: Secondary | ICD-10-CM

## 2021-12-11 DIAGNOSIS — I10 Essential (primary) hypertension: Secondary | ICD-10-CM

## 2021-12-11 MED ORDER — FUROSEMIDE 40 MG PO TABS: 40.0000 mg | ORAL_TABLET | Freq: Two times a day (BID) | ORAL | 0 refills | Status: DC

## 2021-12-11 MED ORDER — ATORVASTATIN CALCIUM 40 MG PO TABS: 40.0000 mg | ORAL_TABLET | Freq: Every evening | ORAL | 3 refills | Status: DC

## 2021-12-11 MED ORDER — CHLORTHALIDONE 25 MG PO TABS: 25.0000 mg | ORAL_TABLET | Freq: Every day | ORAL | 1 refills | Status: DC

## 2021-12-11 MED ORDER — HYDROCODONE-ACETAMINOPHEN 5-325 MG PO TABS: 1.0000 | ORAL_TABLET | Freq: Two times a day (BID) | ORAL | 0 refills | Status: DC | PRN

## 2021-12-11 MED ORDER — MEDICAL COMPRESSION SOCKS MISC: 0 refills | Status: DC

## 2021-12-11 NOTE — Telephone Encounter (Signed)
Patient would like a script left at the front for compression socks for both legs.  She needs them to be 40 -50 mmhg.    She would like to pick these up by Friday. Christen Bame, CMA

## 2021-12-11 NOTE — Patient Instructions (Addendum)
It was wonderful to meet you today. Thank you for allowing me to be a part of your care. Below is a short summary of what we discussed at your visit today:  Please take your Lasix 40 mg twice daily.  Continue to apply Neosporin to the affected wound.  Refilled your medications.  Please bring all of your medications to every appointment!  If you have any questions or concerns, please do not hesitate to contact us via phone or MyChart message.   Alen Bleacher, MD [facility]

## 2021-12-11 NOTE — Assessment & Plan Note (Signed)
Patient with well-documented history of lymphedema on exam has significant lateral lower extremity edema with multiple pressure ulcers.  Currently on Lasix 40 mg daily with good tolerance and her last BMP showed normal electrolyte levels.  Previous visit she was provided with resources on how to apply for disability.  Given the progressive nature of her lymphedema and difficulties with ambulation and is affected her oral activity she would benefit from disability and long term wound care. -Increased her Lasix to 40 mg twice daily -Placed order for compression socks which she will wear as needed -Recommend continued use of the daily pump 3 times daily. -Continue application of Neosporin claim on wound surfaces.

## 2021-12-11 NOTE — Progress Notes (Signed)
    SUBJECTIVE:   CHIEF COMPLAINT / HPI:   Patient is a 56 year old female with history of lymphedema presents with complaint of bilateral lower extremity swelling. She currently have two pen sores being managed by wound care. Also have a pump which helped with draining of her LE edema and uses it 3 tmes a day. Was recently let off at work due to inability to perform her duties efficiently and requiring more accomodations at work. She also has lost her health and life insurance in the process Patient is currently working on obtaining diability. Was provided resources in previous visit on how to apply for disability. Currently on lasix 40mg  daily. Last BMP was normal. She has been applying tergiderm and neosporin over the wound.   PERTINENT  PMH / PSH: Lymphedema, GERD, hypertension, chronic venous insufficiency.  OBJECTIVE:   BP 118/72   Pulse 85   Ht 5\' 8"  (1.727 m)   Wt (!) 446 lb (202.3 kg)   LMP 05/15/2012   SpO2 98%   BMI 67.81 kg/m    Physical Exam General: Alert, well appearing, NAD Cardiovascular: RRR, No Murmurs, Normal S2/S2 Respiratory: CTAB, No wheezing or Rales Abdomen: No distension or tenderness Extremities: +3 BLE Edema up above the knee with some surrounding erythema and pressure ulcers (See pic below)      ASSESSMENT/PLAN:   No problem-specific Assessment & Plan notes found for this encounter.     Alen Bleacher, MD [facility]

## 2021-12-11 NOTE — Telephone Encounter (Signed)
Script was printed and placed up front for pick up.  Pt is aware. Christen Bame, CMA

## 2021-12-27 ENCOUNTER — Encounter (HOSPITAL_BASED_OUTPATIENT_CLINIC_OR_DEPARTMENT_OTHER): Payer: Medicaid Other | Admitting: Internal Medicine

## 2022-01-01 ENCOUNTER — Encounter (HOSPITAL_BASED_OUTPATIENT_CLINIC_OR_DEPARTMENT_OTHER): Payer: Medicaid Other | Admitting: Internal Medicine

## 2022-01-03 ENCOUNTER — Other Ambulatory Visit: Payer: Self-pay | Admitting: Student

## 2022-01-03 DIAGNOSIS — I890 Lymphedema, not elsewhere classified: Secondary | ICD-10-CM

## 2022-01-03 DIAGNOSIS — G8929 Other chronic pain: Secondary | ICD-10-CM

## 2022-01-04 MED ORDER — HYDROCODONE-ACETAMINOPHEN 5-325 MG PO TABS: 1.0000 | ORAL_TABLET | Freq: Two times a day (BID) | ORAL | 0 refills | Status: DC | PRN

## 2022-01-04 MED ORDER — FUROSEMIDE 40 MG PO TABS: 40.0000 mg | ORAL_TABLET | Freq: Two times a day (BID) | ORAL | 0 refills | Status: DC

## 2022-02-08 DIAGNOSIS — Z419 Encounter for procedure for purposes other than remedying health state, unspecified: Secondary | ICD-10-CM | POA: Diagnosis not present

## 2022-02-18 ENCOUNTER — Ambulatory Visit (INDEPENDENT_AMBULATORY_CARE_PROVIDER_SITE_OTHER): Payer: Medicaid Other | Admitting: Student

## 2022-02-18 ENCOUNTER — Encounter: Payer: Self-pay | Admitting: Student

## 2022-02-18 VITALS — BP 154/76 | HR 83 | Ht 68.0 in | Wt >= 6400 oz

## 2022-02-18 DIAGNOSIS — G8929 Other chronic pain: Secondary | ICD-10-CM | POA: Diagnosis not present

## 2022-02-18 DIAGNOSIS — K219 Gastro-esophageal reflux disease without esophagitis: Secondary | ICD-10-CM | POA: Diagnosis not present

## 2022-02-18 DIAGNOSIS — B354 Tinea corporis: Secondary | ICD-10-CM | POA: Diagnosis not present

## 2022-02-18 DIAGNOSIS — I890 Lymphedema, not elsewhere classified: Secondary | ICD-10-CM

## 2022-02-18 DIAGNOSIS — I10 Essential (primary) hypertension: Secondary | ICD-10-CM | POA: Diagnosis not present

## 2022-02-18 DIAGNOSIS — E7800 Pure hypercholesterolemia, unspecified: Secondary | ICD-10-CM | POA: Diagnosis not present

## 2022-02-18 MED ORDER — PROPRANOLOL HCL 40 MG PO TABS: 80.0000 mg | ORAL_TABLET | Freq: Two times a day (BID) | ORAL | 3 refills | Status: DC

## 2022-02-18 MED ORDER — CHLORTHALIDONE 25 MG PO TABS: 25.0000 mg | ORAL_TABLET | Freq: Every day | ORAL | 1 refills | Status: DC

## 2022-02-18 MED ORDER — TERBINAFINE HCL 250 MG PO TABS: 250.0000 mg | ORAL_TABLET | Freq: Every day | ORAL | 0 refills | Status: AC

## 2022-02-18 MED ORDER — TERBINAFINE HCL 1 % EX CREA: 1.0000 | TOPICAL_CREAM | Freq: Two times a day (BID) | CUTANEOUS | 0 refills | Status: DC

## 2022-02-18 MED ORDER — CEPHALEXIN 500 MG PO CAPS: 500.0000 mg | ORAL_CAPSULE | Freq: Three times a day (TID) | ORAL | 0 refills | Status: AC

## 2022-02-18 MED ORDER — NYSTATIN 100000 UNIT/GM EX CREA: TOPICAL_CREAM | Freq: Two times a day (BID) | CUTANEOUS | 2 refills | Status: DC

## 2022-02-18 MED ORDER — ATORVASTATIN CALCIUM 40 MG PO TABS: 40.0000 mg | ORAL_TABLET | Freq: Every evening | ORAL | 3 refills | Status: DC

## 2022-02-18 MED ORDER — HYDROCODONE-ACETAMINOPHEN 5-325 MG PO TABS: 1.0000 | ORAL_TABLET | Freq: Two times a day (BID) | ORAL | 0 refills | Status: DC | PRN

## 2022-02-18 MED ORDER — ESOMEPRAZOLE MAGNESIUM 40 MG PO CPDR: DELAYED_RELEASE_CAPSULE | ORAL | 3 refills | Status: DC

## 2022-02-18 NOTE — Patient Instructions (Addendum)
It was wonderful to see you today. Thank you for allowing me to be a part of your care. Below is a short summary of what we discussed at your visit today:  I have prescribed to Terbinafine for your ringworm which you will take daily for 2 weeks.  Also there is the all your meds which have refilled you can apply that as prescribed.  Have sent in prescription for Keflex which you will take 3 times daily for 1 week for your skin infection.  Refilled your medications.  Continue to work on your diet and activity for weight loss.  We will see you in 2 weeks to check your A1c and possible like to start you on Ozempic.   If you have any questions or concerns, please do not hesitate to contact us via phone or MyChart message.   Jerre Simon, MD [facility]

## 2022-02-18 NOTE — Progress Notes (Signed)
    SUBJECTIVE:   CHIEF COMPLAINT / HPI:   Patient is a 56 year old female presenting for medication refill.  Wound in LE  Patient with history of significant lymphedema reports having intermittent draining wounds due to her lymphedema.  Reports one of the wound on her right LE is currently tender, red and warm.  Patient is still being followed by wound care. But they cancelled a recent appointment due to an emergency.  Patient said she got Medicaid now and we will be able to resume care with the wound team.   Right arm rash Patient reports history of ringworm.  She said this happens whenever she gets in contact with the home cats.  And last week she noticed lesions on her right forearm that are consistent with ringworm.  She is previously used oral terbinafine and topical to better define.  Endorses better response with oral terbinafine. Lesion is pruritic and non tender.   PERTINENT  PMH / PSH: Reviewed  OBJECTIVE:   BP (!) 154/76   Pulse 83   Ht 5\' 8"  (1.727 m)   Wt (!) 445 lb 4 oz (202 kg)   LMP 05/15/2012   SpO2 96%   BMI 67.70 kg/m    Physical Exam General: Alert, well appearing, NAD, Oriented x4 Cardiovascular: RRR, No Murmurs, Normal S2/S2 Respiratory: CTAB, No wheezing or Rales Abdomen: No distension or tenderness Extremities:RLE with 24mm lesion with surrounding erythema and tenderness with palpation (see pic below)  Skin:3 erythematous non-raised rash (see pics below)        ASSESSMENT/PLAN:   Lymphedema Patient presenting with 3-56mm ulcer in the RLE like secondary to significant lymphedema. Ulcer has surround area of erythema, and tender to such that's concerning for cellulitis. Treating with Keflex 500 mg to be take three times daily for 7 days. Advised patient to continue care with wound care team.  Obesity, Class III, BMI 40-49.9 (morbid obesity) (HCC) Patient weight today was 445lb 4 oz with BMI of 67.7. Patient report she's been working on losing weight  by cutting soda intake and making conscious effort to drink more water. Also working on being more active. She endorses on cutting down on her food portion as well. Discussed multiple options for weight loss which includes dieting, exercising, GLP-1 medication and the most effective would likely be bariatric surgery. Patient is open to surgery but would like to consider medication first before surgery. Plan will be to follow up in 2-3 weeks to check A1c and look to initiate ozempic.   RUE rash. Patient with history of tinea corporis presenting with multiple pruritic lesion on her right arm. On exam has 3 erythematous non-raised rash suspicious of Tinea Corporis. -Rx Terbinafine 250mg  daily for 2 weeks -Refilled her topical Terbinafine     4m, MD [facility]

## 2022-02-18 NOTE — Assessment & Plan Note (Signed)
Patient weight today was 445lb 4 oz with BMI of 67.7. Patient report she's been working on losing weight by cutting soda intake and making conscious effort to drink more water. Also working on being more active. She endorses on cutting down on her food portion as well. Discussed multiple options for weight loss which includes dieting, exercising, GLP-1 medication and the most effective would likely be bariatric surgery. Patient is open to surgery but would like to consider medication first before surgery. Plan will be to follow up in 2-3 weeks to check A1c and look to initiate ozempic.

## 2022-02-18 NOTE — Assessment & Plan Note (Signed)
Patient presenting with 3-11mm ulcer in the RLE like secondary to significant lymphedema. Ulcer has surround area of erythema, and tender to such that's concerning for cellulitis. Treating with Keflex 500 mg to be take three times daily for 7 days. Advised patient to continue care with wound care team.

## 2022-03-01 ENCOUNTER — Ambulatory Visit: Payer: Medicaid Other | Admitting: Student

## 2022-03-08 ENCOUNTER — Telehealth: Payer: Self-pay

## 2022-03-08 ENCOUNTER — Encounter: Payer: Self-pay | Admitting: Student

## 2022-03-08 ENCOUNTER — Ambulatory Visit (INDEPENDENT_AMBULATORY_CARE_PROVIDER_SITE_OTHER): Payer: Medicaid Other | Admitting: Student

## 2022-03-08 VITALS — BP 137/52 | HR 82

## 2022-03-08 DIAGNOSIS — G8929 Other chronic pain: Secondary | ICD-10-CM | POA: Diagnosis not present

## 2022-03-08 DIAGNOSIS — R634 Abnormal weight loss: Secondary | ICD-10-CM | POA: Diagnosis not present

## 2022-03-08 LAB — POCT GLYCOSYLATED HEMOGLOBIN (HGB A1C): Hemoglobin A1C: 5.5 % (ref 4.0–5.6)

## 2022-03-08 MED ORDER — SEMAGLUTIDE(0.25 OR 0.5MG/DOS) 2 MG/1.5ML ~~LOC~~ SOPN: 0.2500 mg | PEN_INJECTOR | SUBCUTANEOUS | 3 refills | Status: DC

## 2022-03-08 NOTE — Telephone Encounter (Signed)
Error

## 2022-03-08 NOTE — Assessment & Plan Note (Signed)
Patient on chronic opoid treatment for pain management. Currently on 60 mg for Norco. -Emphasized need to slowly titrate down. Patient is reluctant at this time but open to reducing dose at summer time.

## 2022-03-08 NOTE — Patient Instructions (Signed)
It was wonderful to meet you today. Thank you for allowing me to be a part of your care. Below is a short summary of what we discussed at your visit today:  Your A1c today was normal at 5.5%  I will complete the form for power wheel chair and fax by end of next week  I placed order for Ozempic for weight loss which you will start with low dose 0.25mg  weekly. We will slowly increase dose if approved by your insurance .  I recommend working on dieting and exercising to improve your weight /  Please bring all of your medications to every appointment!  If you have any questions or concerns, please do not hesitate to contact us via phone or MyChart message.   Jerre Simon, MD [facility]     Mediterranean Diet A Mediterranean diet refers to food and lifestyle choices that are based on the traditions of countries located on the [place] Sea. It focuses on eating more fruits, vegetables, whole grains, beans, nuts, seeds, and heart-healthy fats, and eating less dairy, meat, eggs, and processed foods with added sugar, salt, and fat. This way of eating has been shown to help prevent certain conditions and improve outcomes for people who have chronic diseases, like kidney disease and heart disease. What are tips for following this plan? Reading food labels Check the serving size of packaged foods. For foods such as rice and pasta, the serving size refers to the amount of cooked product, not dry. Check the total fat in packaged foods. Avoid foods that have saturated fat or trans fats. Check the ingredient list for added sugars, such as corn syrup. Shopping  Buy a variety of foods that offer a balanced diet, including: Fresh fruits and vegetables (produce). Grains, beans, nuts, and seeds. Some of these may be available in unpackaged forms or large amounts (in bulk). Fresh seafood. Poultry and eggs. Low-fat dairy products. Buy whole ingredients instead of prepackaged  foods. Buy fresh fruits and vegetables in-season from local farmers markets. Buy plain frozen fruits and vegetables. If you do not have access to quality fresh seafood, buy precooked frozen shrimp or canned fish, such as tuna, salmon, or sardines. Stock your pantry so you always have certain foods on hand, such as olive oil, canned tuna, canned tomatoes, rice, pasta, and beans. Cooking Cook foods with extra-virgin olive oil instead of using butter or other vegetable oils. Have meat as a side dish, and have vegetables or grains as your main dish. This means having meat in small portions or adding small amounts of meat to foods like pasta or stew. Use beans or vegetables instead of meat in common dishes like chili or lasagna. Experiment with different cooking methods. Try roasting, broiling, steaming, and sauting vegetables. Add frozen vegetables to soups, stews, pasta, or rice. Add nuts or seeds for added healthy fats and plant protein at each meal. You can add these to yogurt, salads, or vegetable dishes. Marinate fish or vegetables using olive oil, lemon juice, garlic, and fresh herbs. Meal planning Plan to eat one vegetarian meal one day each week. Try to work up to two vegetarian meals, if possible. Eat seafood two or more times a week. Have healthy snacks readily available, such as: Vegetable sticks with hummus. Greek yogurt. Fruit and nut trail mix. Eat balanced meals throughout the week. This includes: Fruit: 2-3 servings a day. Vegetables: 4-5 servings a day. Low-fat dairy: 2 servings a day. Fish, poultry, or lean meat:  1 serving a day. Beans and legumes: 2 or more servings a week. Nuts and seeds: 1-2 servings a day. Whole grains: 6-8 servings a day. Extra-virgin olive oil: 3-4 servings a day. Limit red meat and sweets to only a few servings a month. Lifestyle  Cook and eat meals together with your family, when possible. Drink enough fluid to keep your urine pale yellow. Be  physically active every day. This includes: Aerobic exercise like running or swimming. Leisure activities like gardening, walking, or housework. Get 7-8 hours of sleep each night. If recommended by your health care provider, drink red wine in moderation. This means 1 glass a day for nonpregnant women and 2 glasses a day for men. A glass of wine equals 5 oz (150 mL). What foods should I eat? Fruits Apples. Apricots. Avocado. Berries. Bananas. Cherries. Dates. Figs. Grapes. Lemons. Melon. Oranges. Peaches. Plums. Pomegranate. Vegetables Artichokes. Beets. Broccoli. Cabbage. Carrots. Eggplant. Green beans. Chard. Kale. Spinach. Onions. Leeks. Peas. Squash. Tomatoes. Peppers. Radishes. Grains Whole-grain pasta. Brown rice. Bulgur wheat. Polenta. Couscous. Whole-wheat bread. Orpah Cobb. Meats and other proteins Beans. Almonds. Sunflower seeds. Pine nuts. Peanuts. Cod. Salmon. Scallops. Shrimp. Tuna. Tilapia. Clams. Oysters. Eggs. Poultry without skin. Dairy Low-fat milk. Cheese. Greek yogurt. Fats and oils Extra-virgin olive oil. Avocado oil. Grapeseed oil. Beverages Water. Red wine. Herbal tea. Sweets and desserts Greek yogurt with honey. Baked apples. Poached pears. Trail mix. Seasonings and condiments Basil. Cilantro. Coriander. Cumin. Mint. Parsley. Sage. Rosemary. Tarragon. Garlic. Oregano. Thyme. Pepper. Balsamic vinegar. Tahini. Hummus. Tomato sauce. Olives. Mushrooms. The items listed above may not be a complete list of foods and beverages you can eat. Contact a dietitian for more information. What foods should I limit? This is a list of foods that should be eaten rarely or only on special occasions. Fruits Fruit canned in syrup. Vegetables Deep-fried potatoes (french fries). Grains Prepackaged pasta or rice dishes. Prepackaged cereal with added sugar. Prepackaged snacks with added sugar. Meats and other proteins Beef. Pork. Lamb. Poultry with skin. Hot dogs.  Tomasa Blase. Dairy Ice cream. Sour cream. Whole milk. Fats and oils Butter. Canola oil. Vegetable oil. Beef fat (tallow). Lard. Beverages Juice. Sugar-sweetened soft drinks. Beer. Liquor and spirits. Sweets and desserts Cookies. Cakes. Pies. Candy. Seasonings and condiments Mayonnaise. Pre-made sauces and marinades. The items listed above may not be a complete list of foods and beverages you should limit. Contact a dietitian for more information. Summary The Mediterranean diet includes both food and lifestyle choices. Eat a variety of fresh fruits and vegetables, beans, nuts, seeds, and whole grains. Limit the amount of red meat and sweets that you eat. If recommended by your health care provider, drink red wine in moderation. This means 1 glass a day for nonpregnant women and 2 glasses a day for men. A glass of wine equals 5 oz (150 mL). This information is not intended to replace advice given to you by your health care provider. Make sure you discuss any questions you have with your health care provider. Document Revised: 04/02/2019 Document Reviewed: 01/28/2019 Elsevier Patient Education  2023 [employer].

## 2022-03-08 NOTE — Assessment & Plan Note (Addendum)
Patient with morbid obesity and significant lymphedema affecting her ambulation and with reported multiple falls. Was previously evaluated and approved for Power wheel chair by Neuro rehab per chart review on 08/21/21. A1c was 5.5 -Informed patient I will completed the insurance form and fax next week - Rx Ozempic 0.25mg  weekly with plans to titrate up if tolerated -Discussed exercise and encouraged dieting with Mediterranean diet -Provided patient with resources on Mediterranean diet -Discussed Bariatric surgery as an option

## 2022-03-08 NOTE — Progress Notes (Cosign Needed)
    SUBJECTIVE:   CHIEF COMPLAINT / HPI:   Patient is a 56 year old female with history of Morbid obesity and significant Lyphemphedema. She presents today for evaluation of needs for Power wheel chair.  Patient report significant mobility issues and multiple falls,.  Endorses difficulty moving from bedroom to Living room or kitchen  Patient said she has recently been evaluated 3 times by neuro rehab and was approved. First time the wrong Power wheel chair was ordered and the second time she couldn't get it because she lost her job in between process and lost her insurance with it. Now she has Medicaid.  Per chart review had a PT eval with Neuro rehab for Power wheel chair which was approved per note on 09/03/21.  PERTINENT  PMH / PSH: Reviewed  OBJECTIVE:   BP (!) 137/52   Pulse 82   LMP 05/15/2012   SpO2 100%    Physical Exam General: Alert, Morbidly obese, NAD Cardiovascular: RRR, No Murmurs, Normal S2/S2 Respiratory: CTAB, No wheezing or Rales Abdomen: No distension or tenderness Extremities: +3 BLE edema     ASSESSMENT/PLAN:   Obesity, Class III, BMI 40-49.9 (morbid obesity) (HCC) Patient with morbid obesity and significant lymphedema affecting her ambulation and with reported multiple falls. Was previously evaluated and approved for Power wheel chair by Neuro rehab per chart review on 08/21/21. A1c was 5.5 -Informed patient I will completed the insurance form and fax next week - Rx Ozempic 0.25mg  weekly with plans to titrate up if tolerated -Discussed exercise and encouraged dieting with Mediterranean diet -Provided patient with resources on Mediterranean diet -Discussed Bariatric surgery as an option  Encounter for chronic pain management Patient on chronic opoid treatment for pain management. Currently on 60 mg for Norco. -Emphasized need to slowly titrate down. Patient is reluctant at this time but open to reducing dose at summer time.    Jerre Simon, MD [facility]

## 2022-03-11 DIAGNOSIS — Z419 Encounter for procedure for purposes other than remedying health state, unspecified: Secondary | ICD-10-CM | POA: Diagnosis not present

## 2022-03-12 ENCOUNTER — Telehealth: Payer: Self-pay

## 2022-03-12 ENCOUNTER — Other Ambulatory Visit (HOSPITAL_COMMUNITY): Payer: Self-pay

## 2022-03-12 NOTE — Telephone Encounter (Signed)
Prior Auth for patients medication OZEMPIC denied by [facility] MEDICAID via CoverMyMeds.   Reason: The requested drug is not approved by the FDA for the treatment of MORBID SEVERE OBESITY DUE TO EXCESS CALORIES. It is FDA approved for Type 2 diabetes mellitus in members 59 years of age or older.  CoverMyMeds Key: [id number]

## 2022-03-12 NOTE — Telephone Encounter (Signed)
A Prior Authorization was initiated for this patients Ozempic (0.25 or 0.5 MG/DOSE) 2MG /3ML pen-injectors through CoverMyMeds.   Key: [facility] Patient Advocate

## 2022-03-12 NOTE — Telephone Encounter (Signed)
ERROR

## 2022-03-13 NOTE — Telephone Encounter (Signed)
[employer] calls nurse line reporting denial of Ozempic.   She reports alternatives: Saxenda, Darcel Bayley and Trulicity.   She reports all 3 need a prior auth, however have a "better" shot of approval vs Ozempic.   Will forward to PCP for advisement.

## 2022-03-14 ENCOUNTER — Ambulatory Visit: Payer: Medicaid Other | Admitting: Student

## 2022-03-15 ENCOUNTER — Other Ambulatory Visit: Payer: Self-pay | Admitting: Student

## 2022-03-15 ENCOUNTER — Encounter: Payer: Self-pay | Admitting: Student

## 2022-03-15 DIAGNOSIS — J4530 Mild persistent asthma, uncomplicated: Secondary | ICD-10-CM

## 2022-03-15 MED ORDER — ALBUTEROL SULFATE HFA 108 (90 BASE) MCG/ACT IN AERS: 2.0000 | INHALATION_SPRAY | RESPIRATORY_TRACT | 3 refills | Status: DC | PRN

## 2022-03-15 MED ORDER — BUDESONIDE-FORMOTEROL FUMARATE 160-4.5 MCG/ACT IN AERO: INHALATION_SPRAY | RESPIRATORY_TRACT | 5 refills | Status: DC

## 2022-03-15 MED ORDER — MOUNJARO 2.5 MG/0.5ML ~~LOC~~ SOAJ: 2.5000 mg | SUBCUTANEOUS | 2 refills | Status: DC

## 2022-03-15 NOTE — Progress Notes (Signed)
Refilled Asthma medication and starting Mounjaro for weight loss since insurance denied Ozempic.

## 2022-03-18 ENCOUNTER — Telehealth: Payer: Self-pay

## 2022-03-18 NOTE — Telephone Encounter (Signed)
A Prior Authorization was initiated for this patients MOUNJARO through CoverMyMeds.   Key:[id number]

## 2022-03-19 ENCOUNTER — Encounter: Payer: Self-pay | Admitting: Student

## 2022-03-19 NOTE — Telephone Encounter (Signed)
Prior Auth for patients medication MOUNJARO denied by [facility] MEDICAID via CoverMyMeds.   Reason: The requested drug is not approved by the Food and Drug Administration (FDA) for the treatment of MORBID SEVERE OBESITY DUE TO [place] in members 59 years of age or older. It is FDA approved for type 2 diabetes mellitus in members 5 years of age or older.  CoverMyMeds Key: [id number]  Patient aware as well.

## 2022-03-20 ENCOUNTER — Other Ambulatory Visit: Payer: Self-pay | Admitting: Family Medicine

## 2022-03-20 ENCOUNTER — Other Ambulatory Visit: Payer: Self-pay | Admitting: Student

## 2022-03-20 DIAGNOSIS — G8929 Other chronic pain: Secondary | ICD-10-CM

## 2022-03-21 ENCOUNTER — Telehealth: Payer: Self-pay

## 2022-03-21 MED ORDER — HYDROCODONE-ACETAMINOPHEN 5-325 MG PO TABS: 1.0000 | ORAL_TABLET | Freq: Two times a day (BID) | ORAL | 0 refills | Status: DC | PRN

## 2022-03-21 MED ORDER — AQUAPHOR EX OINT: TOPICAL_OINTMENT | CUTANEOUS | 0 refills | Status: DC | PRN

## 2022-03-21 NOTE — Telephone Encounter (Signed)
Received phone call from [place] regarding power wheelchair paperwork. Page 20 was not completed (PT eval).   This has been faxed back to our office for completion. Please fax back to provided number once completed.   Talbot Grumbling, RN

## 2022-03-22 ENCOUNTER — Telehealth: Payer: Self-pay | Admitting: Student

## 2022-03-22 NOTE — Telephone Encounter (Signed)
error 

## 2022-04-08 ENCOUNTER — Ambulatory Visit (INDEPENDENT_AMBULATORY_CARE_PROVIDER_SITE_OTHER): Payer: Medicaid Other | Admitting: Student

## 2022-04-08 ENCOUNTER — Encounter: Payer: Self-pay | Admitting: Student

## 2022-04-08 VITALS — BP 128/72 | HR 81 | Wt >= 6400 oz

## 2022-04-08 DIAGNOSIS — I890 Lymphedema, not elsewhere classified: Secondary | ICD-10-CM | POA: Diagnosis not present

## 2022-04-08 NOTE — Assessment & Plan Note (Addendum)
Patient with chronic lymphedema and associated wound. Today wound at the left calf doesn't appear infected. Patient and her supportive family have been working hard in caring for the wound and BLE edema. Current care include daily wound pump twice a day, keeping leg elevated and keeping wound moisturized with Vaseline/Aquaphor and occasionally Neosporin. -Discussed importance of weight loss -Encourage continue routine care -Placed referral for wound care, would need to determine need for increased frequency of wound pumping

## 2022-04-08 NOTE — Patient Instructions (Signed)
It was wonderful to see you today. Thank you for allowing me to be a part of your care. Below is a short summary of what we discussed at your visit today:  Congratulations  on losing 21 pounds since her last visit.  I have placed a referral for bariatric surgery discussed.  I also placed a referral for wound care.  And when you restart care with them please verify how often you should be using the wound pump.  So I recommend using Vaseline or Aquaphor to keep the wound moisturized.  Only use Neosporin when you feel the wound is infected.  Please bring all of your medications to every appointment!  If you have any questions or concerns, please do not hesitate to contact us via phone or MyChart message.   Alen Bleacher, MD [facility]

## 2022-04-08 NOTE — Progress Notes (Signed)
SUBJECTIVE:   CHIEF COMPLAINT / HPI:   Patient is a 57 year old female presenting today to discuss Lymphedema and weight loss. Lost 21lbs since last visit. Reports eating mostly salad and cutting out mostly sugary drinks, not eating out or consuming can foods. Today patient wants a game plan on how to help her overall situation with weight loss and lymphedema. Endorses worsening bilateral knee pain due to her arthritis which she attributes to her weight .Her short term goal to continue losing weight Long term goal is sto lose enough weight to be able to  to start normal activity such as cooking in kitchen, improved mobility and going back to working. Currently applied for diability and application is pending. I think surgery was discussed in the last 2 visits  and patient expressed that she is concerned related. Her plan is to meet her personal goal in 60-90 days and if she doesn't reach her goal will ask for Biatric surgery referral. GLP- 1 has been discussed with patient and multiple attempt to start these meds has been hampered by denial from insurance.    Lymphedema Swelling and BLE paresthesia is worse in the evening.  Has intermittent opening of the wounds that are off and on. Currently have 3 wounds, one on top of left foot which is closed and healing well, main concerning wound is on the left calf (see pic below).  Current management include keeping leg elevated, use of the pump twice daily for 1 hour each session and use of Vaseline or Aquaphor over the wound before covering it with Tegaderm.  Occasionally will use Neosporin or for any if she feels its infected or draining.  Of note endorses ome day of fever last week and recently completed antibiotics for cellulitis of the wound.   PERTINENT  PMH / PSH: Reviewed   OBJECTIVE:   BP 128/72   Pulse 81   Wt (!) 424 lb 3.2 oz (192.4 kg)   LMP 05/15/2012   SpO2 94%   BMI 64.50 kg/m    Physical Exam General: Alert, morbidly obese,  NAD Cardiovascular: RRR, well-perfused Respiratory: Normal WOB on RA Extremities: +3 BLE edema, with open superficial wound on the left calf and mild surrounding erythema. Wound is currently wrapped .   ASSESSMENT/PLAN:   Obesity, Class III, BMI 40-49.9 (morbid obesity) ([place]) Today patient has lost about 21 pounds since last visit about a month ago. Seems to be making strides with her dietary adjustments.  However given her comorbidity likely attributed to obesity such as increased falls, decreased mobility, wheel chair bound and worsening lymphedema she would benefit from a drastic measure to lose weight. Solely focusing on diet would not yield an urgent result needed to improve her living condition. Discussed with patient the need for drastic result to improve her quality of life and via shared decision agreed to place referral for Bariatric surgery while she continues to work on life style changes given the process required before the actual surgery takes place could take months. -Placed referral to Bariatric surgery -Encouraged patient to continue working on lie style changes   Lymphedema Patient with chronic lymphedema and associated wound. Today wound at the left calf doesn't appear infected. Patient and her supportive family have been working hard in caring for the wound and BLE edema. Current care include daily wound pump twice a day, keeping leg elevated and keeping wound moisturized with Vaseline/Aquaphor and occasionally Neosporin. -Discussed importance of weight loss -Encourage continue routine care -Placed referral  for wound care, would need to determine need for increased frequency of wound pumping     Alen Bleacher, MD [place]

## 2022-04-08 NOTE — Assessment & Plan Note (Signed)
Today patient has lost about 21 pounds since last visit about a month ago. Seems to be making strides with her dietary adjustments.  However given her comorbidity likely attributed to obesity such as increased falls, decreased mobility, wheel chair bound and worsening lymphedema she would benefit from a drastic measure to lose weight. Solely focusing on diet would not yield an urgent result needed to improve her living condition. Discussed with patient the need for drastic result to improve her quality of life and via shared decision agreed to place referral for Bariatric surgery while she continues to work on life style changes given the process required before the actual surgery takes place could take months. -Placed referral to Bariatric surgery -Encouraged patient to continue working on lie style changes

## 2022-04-11 DIAGNOSIS — Z419 Encounter for procedure for purposes other than remedying health state, unspecified: Secondary | ICD-10-CM | POA: Diagnosis not present

## 2022-04-25 DIAGNOSIS — I890 Lymphedema, not elsewhere classified: Secondary | ICD-10-CM | POA: Diagnosis not present

## 2022-04-25 DIAGNOSIS — L97911 Non-pressure chronic ulcer of unspecified part of right lower leg limited to breakdown of skin: Secondary | ICD-10-CM | POA: Diagnosis not present

## 2022-04-26 ENCOUNTER — Other Ambulatory Visit: Payer: Self-pay | Admitting: Student

## 2022-04-26 DIAGNOSIS — G8929 Other chronic pain: Secondary | ICD-10-CM

## 2022-04-26 MED ORDER — HYDROCODONE-ACETAMINOPHEN 5-325 MG PO TABS: 1.0000 | ORAL_TABLET | Freq: Two times a day (BID) | ORAL | 0 refills | Status: DC | PRN

## 2022-04-26 MED ORDER — AQUAPHOR EX OINT: TOPICAL_OINTMENT | CUTANEOUS | 0 refills | Status: DC | PRN

## 2022-04-26 NOTE — Telephone Encounter (Signed)
Limited time presciption. Refill not appropriate

## 2022-05-02 ENCOUNTER — Other Ambulatory Visit: Payer: Self-pay

## 2022-05-02 ENCOUNTER — Encounter: Payer: Self-pay | Admitting: Student

## 2022-05-02 DIAGNOSIS — I890 Lymphedema, not elsewhere classified: Secondary | ICD-10-CM

## 2022-05-02 MED ORDER — FUROSEMIDE 40 MG PO TABS: 40.0000 mg | ORAL_TABLET | Freq: Two times a day (BID) | ORAL | 0 refills | Status: DC

## 2022-05-03 ENCOUNTER — Other Ambulatory Visit: Payer: Self-pay | Admitting: Student

## 2022-05-03 DIAGNOSIS — I83012 Varicose veins of right lower extremity with ulcer of calf: Secondary | ICD-10-CM

## 2022-05-03 MED ORDER — CEPHALEXIN 500 MG PO CAPS: 500.0000 mg | ORAL_CAPSULE | Freq: Three times a day (TID) | ORAL | 0 refills | Status: AC

## 2022-05-03 NOTE — Progress Notes (Signed)
For cellulitis

## 2022-05-10 DIAGNOSIS — Z419 Encounter for procedure for purposes other than remedying health state, unspecified: Secondary | ICD-10-CM | POA: Diagnosis not present

## 2022-05-16 ENCOUNTER — Encounter (HOSPITAL_BASED_OUTPATIENT_CLINIC_OR_DEPARTMENT_OTHER): Payer: Medicaid Other | Attending: Internal Medicine | Admitting: Internal Medicine

## 2022-05-16 DIAGNOSIS — Z6841 Body Mass Index (BMI) 40.0 and over, adult: Secondary | ICD-10-CM | POA: Insufficient documentation

## 2022-05-16 DIAGNOSIS — Z993 Dependence on wheelchair: Secondary | ICD-10-CM | POA: Diagnosis not present

## 2022-05-16 DIAGNOSIS — M170 Bilateral primary osteoarthritis of knee: Secondary | ICD-10-CM | POA: Diagnosis not present

## 2022-05-16 DIAGNOSIS — I872 Venous insufficiency (chronic) (peripheral): Secondary | ICD-10-CM | POA: Diagnosis not present

## 2022-05-16 DIAGNOSIS — I890 Lymphedema, not elsewhere classified: Secondary | ICD-10-CM | POA: Insufficient documentation

## 2022-05-16 DIAGNOSIS — J45909 Unspecified asthma, uncomplicated: Secondary | ICD-10-CM | POA: Diagnosis not present

## 2022-05-16 DIAGNOSIS — Z981 Arthrodesis status: Secondary | ICD-10-CM | POA: Diagnosis not present

## 2022-05-16 DIAGNOSIS — I10 Essential (primary) hypertension: Secondary | ICD-10-CM | POA: Diagnosis not present

## 2022-05-16 DIAGNOSIS — L97812 Non-pressure chronic ulcer of other part of right lower leg with fat layer exposed: Secondary | ICD-10-CM | POA: Insufficient documentation

## 2022-05-18 NOTE — Progress Notes (Signed)
Catherine, Powell ([id number]) [phone].pdf Page 1 of 4 Visit Report for 05/16/2022 Abuse Risk Screen Details Patient Name: Date of Service: Catherine Powell 05/16/2022 9:30 A M Medical Record Number: [id number] Patient Account Number: [id number] Date of Birth/Sex: Treating RN: 12-12-1965 (57 y.o. F) Primary Care Percival Glasheen: Alen Bleacher Other Clinician: Referring Zacherie Honeyman: Treating Chenoah Mcnally/Extender: Jannet Askew in Treatment: 0 Abuse Risk Screen Items Answer ABUSE RISK SCREEN: Has anyone close to you tried to hurt or harm you recentlyo No Do you feel uncomfortable with anyone in your familyo No Has anyone forced you do things that you didnt want to doo No Electronic Signature(s) Signed: 05/16/2022 3:53:55 PM By: Erenest Blank Entered By: Erenest Blank on 05/16/2022 09:39:55 -------------------------------------------------------------------------------- Activities of Daily Living Details Patient Name: Date of Service: Catherine Powell 05/16/2022 9:30 A M Medical Record Number: [id number] Patient Account Number: [id number] Date of Birth/Sex: Treating RN: [birth year] (57 y.o. F) Primary Care Neomi Laidler: Alen Bleacher Other Clinician: Referring Declan Adamson: Treating Imogen Maddalena/Extender: Jannet Askew in Treatment: 0 Activities of Daily Living Items Answer Activities of Daily Living (Please select one for each item) Drive Automobile Not Able T Medications ake Need Assistance Use T elephone Completely Able Care for Appearance Need Assistance Use T oilet Need Assistance Bath / Shower Need Assistance Dress Self Need Assistance Feed Self Completely Able Walk Need Assistance Get In / Out Bed Need Assistance Housework Need Assistance Prepare Meals Need Assistance Handle Money Completely Able Shop for Self Not Able Electronic Signature(s) Signed: 05/16/2022 3:53:55 PM By: Erenest Blank Entered By: Erenest Blank on 05/16/2022 09:40:46 -------------------------------------------------------------------------------- Education Screening Details Patient Name: Date of Service: Catherine Curling H. 05/16/2022 9:30 A M Medical Record Number: [id number] Patient Account Number: [id number] Date of Birth/Sex: Treating RN: 1966-01-24 (57 y.o. F) Primary Care Makita Blow: Alen Bleacher Other Clinician: Referring Taeveon Keesling: Treating Yajahira Tison/Extender: Jannet Askew in TreatmentCARDELL, ROTOLO H ([id number]) [id number] Page 2 of 4 Primary Learner Assessed: Patient Learning Preferences/Education Level/Primary Language Learning Preference: Explanation, Demonstration, Printed Material Highest Education Level: College or Above Preferred Language: Diplomatic Services operational officer Language Barrier: No Translator Needed: No Memory Deficit: No Emotional Barrier: No Cultural/Religious Beliefs Affecting Medical Care: No Physical Barrier Impaired Vision: Yes Glasses Impaired Hearing: No Decreased Hand dexterity: No Knowledge/Comprehension Knowledge Level: High Comprehension Level: High Ability to understand written instructions: High Ability to understand verbal instructions: High Motivation Anxiety Level: Calm Cooperation: Cooperative Education Importance: Acknowledges Need Interest in Health Problems: Asks Questions Perception: Coherent Willingness to Engage in Self-Management High Activities: Readiness to Engage in Self-Management High Activities: Electronic Signature(s) Signed: 05/16/2022 3:53:55 PM By: Erenest Blank Entered By: Erenest Blank on 05/16/2022 09:41:32 -------------------------------------------------------------------------------- Fall Risk Assessment Details Patient Name: Date of Service: Catherine Curling H. 05/16/2022 9:30 A M Medical Record Number: [id number] Patient Account Number: [id number] Date of Birth/Sex: Treating  RN: 07-14-1965 (57 y.o. F) Primary Care Catherine Powell: Alen Bleacher Other Clinician: Referring Hinley Brimage: Treating Anelise Staron/Extender: Jannet Askew in Treatment: 0 Fall Risk Assessment Items Have you had 2 or more falls in the last 12 monthso 0 Yes Have you had any fall that resulted in injury in the last 12 monthso 0 Yes FALLS RISK SCREEN History of falling - immediate or within 3 months 25 Yes Secondary diagnosis (Do you have 2 or more medical diagnoseso) 0 No Ambulatory aid None/bed rest/wheelchair/nurse 0 No Crutches/cane/walker 15 Yes Furniture 0 No Intravenous therapy Access/Saline/Heparin Lock 0  No Gait/Transferring Normal/ bed rest/ wheelchair 0 No Weak (short steps with or without shuffle, stooped but able to lift head while walking, may seek 10 Yes support from furniture) Impaired (short steps with shuffle, may have difficulty arising from chair, head down, impaired 0 No balance) Mental Status Oriented to own ability 0 Yes Overestimates or forgets limitations 0 No Risk Level: Medium Risk Score: 50 [place], Asmaa H ([id number]) [id number] Page 3 of 4 Electronic Signature(s) -------------------------------------------------------------------------------- Foot Assessment Details Patient Name: Date of Service: Catherine Powell 05/16/2022 9:30 A M Medical Record Number: [id number] Patient Account Number: [id number] Date of Birth/Sex: Treating RN: 07-09-1965 (57 y.o. F) Primary Care Kimbree Casanas: Alen Bleacher Other Clinician: Referring Najat Olazabal: Treating Serenity Fortner/Extender: Jannet Askew in Treatment: 0 Foot Assessment Items Site Locations + = Sensation present, - = Sensation absent, C = Callus, U = Ulcer R = Redness, W = Warmth, M = Maceration, PU = Pre-ulcerative lesion F = Fissure, S = Swelling, D = Dryness Assessment Right: Left: Other Deformity: No No Prior Foot Ulcer: No No Prior  Amputation: No No Charcot Joint: No No Ambulatory Status: Ambulatory With Help Assistance Device: Wheelchair Gait: Buyer, retail Signature(s) Signed: 05/16/2022 3:53:55 PM By: Erenest Blank Entered By: Erenest Blank on 05/16/2022 09:45:04 -------------------------------------------------------------------------------- Nutrition Risk Screening Details Patient Name: Date of Service: Catherine Powell 05/16/2022 9:30 A M Medical Record Number: [id number] Patient Account Number: [id number] Date of Birth/Sex: Treating RN: 08/08/1965 (57 y.o. F) Primary Care Mischell Branford: Alen Bleacher Other Clinician: Referring Syanna Remmert: Treating Shamya Macfadden/Extender: Jannet Askew in Treatment: 0 Height (in): 68 Weight (lbs): 421 Body Mass Index (BMI): 64 Chanda, Alisson H ([id number]) [id number] Page 4 of 4 Nutrition Risk Screening Items Score Screening NUTRITION RISK SCREEN: I have an illness or condition that made me change the kind and/or amount of food I eat 0 No I eat fewer than two meals per day 0 No I eat few fruits and vegetables, or milk products 0 No I have three or more drinks of beer, liquor or wine almost every day 0 No I have tooth or mouth problems that make it hard for me to eat 0 No I don't always have enough money to buy the food I need 0 No I eat alone most of the time 0 No I take three or more different prescribed or over-the-counter drugs a day 1 Yes Without wanting to, I have lost or gained 10 pounds in the last six months 0 No I am not always physically able to shop, cook and/or feed myself 0 No Nutrition Protocols Good Risk Protocol 0 No interventions needed Moderate Risk Protocol High Risk Proctocol Risk Level: Good Risk Score: 1 Electronic Signature(s) Signed: 05/16/2022 3:53:55 PM By: Erenest Blank Entered By: Erenest Blank on 05/16/2022 09:43:22

## 2022-05-18 NOTE — Progress Notes (Addendum)
KALAYSIA, DEMONBREUN ([id number]) [id number] Page 1 of 8 Visit Report for 05/16/2022 Allergy List Details Patient Name: Date of Service: Catherine Powell 05/16/2022 9:30 A M Medical Record Number: [id number] Patient Account Number: [id number] Date of Birth/Sex: Treating RN: 13-Sep-1965 (57 y.o. [name] Primary Care Hatsuko Bizzarro: Jerre Simon Other Clinician: Referring Jatara Huettner: Treating Zariya Minner/Extender: Gearldine Shown in Treatment: 0 Allergies Active Allergies tolmetin sodium Reaction: Shortness of breath Allergy Notes Electronic Signature(s) Signed: 05/16/2022 3:53:55 PM By: Thayer Dallas Entered By: Thayer Dallas on 05/16/2022 09:31:59 -------------------------------------------------------------------------------- Arrival Information Details Patient Name: Date of Service: Catherine Hock Powell. 05/16/2022 9:30 A M Medical Record Number: [id number] Patient Account Number: [id number] Date of Birth/Sex: Treating RN: Mar 15, 1965 (57 y.o. F) Primary Care Oswell Say: Jerre Simon Other Clinician: Referring Nakiesha Rumsey: Treating Vollie Brunty/Extender: Gearldine Shown in Treatment: 0 Visit Information Patient Arrived: Wheel Chair Arrival Time: 09:21 Transfer Assistance: None Patient Identification Verified: Yes Secondary Verification Process Completed: Yes Patient Requires Transmission-Based Precautions: No Patient Has Alerts: No History Since Last Visit Added or deleted any medications: No Any new allergies or adverse reactions: No Had a fall or experienced change in activities of daily living that may affect risk of falls: No Signs or symptoms of abuse/neglect since last visito No Hospitalized since last visit: No Implantable device outside of the clinic excluding cellular tissue based products placed in the center since last visit: No Electronic Signature(s) Signed: 05/16/2022 3:53:55 PM By: Thayer Dallas Entered By: Thayer Dallas on 05/16/2022 09:28:33 -------------------------------------------------------------------------------- Clinic Level of Care Assessment Details Patient Name: Date of Service: Catherine Powell 05/16/2022 9:30 A M Medical Record Number: [id number] Patient Account Number: [id number] Date of Birth/Sex: Treating RN: 01-06-1966 (57 y.o. [name] Primary Care Braylen Staller: Jerre Simon Other Clinician: Referring Shelbylynn Walczyk: Treating Mukund Weinreb/Extender: Gearldine Shown in Treatment: 0 Clinic Level of Care Assessment Items TOOL 4 Quantity Score X- 1 0 Use when only an EandM is performed on FOLLOW-UP visit ALLEXA, ACOFF ([id number]) [phone].pdf Page 2 of 8 ASSESSMENTS - Nursing Assessment / Reassessment X- 1 10 Reassessment of Co-morbidities (includes updates in patient status) X- 1 5 Reassessment of Adherence to Treatment Plan ASSESSMENTS - Wound and Skin A ssessment / Reassessment X - Simple Wound Assessment / Reassessment - one wound 1 5  - 0 Complex Wound Assessment / Reassessment - multiple wounds X- 1 10 Dermatologic / Skin Assessment (not related to wound area) ASSESSMENTS - Focused Assessment X- 1 5 Circumferential Edema Measurements - multi extremities X- 1 10 Nutritional Assessment / Counseling / Intervention  - 0 Lower Extremity Assessment (monofilament, tuning fork, pulses)  - 0 Peripheral Arterial Disease Assessment (using hand held doppler) ASSESSMENTS - Ostomy and/or Continence Assessment and Care  - 0 Incontinence Assessment and Management  - 0 Ostomy Care Assessment and Management (repouching, etc.) PROCESS - Coordination of Care X - Simple Patient / Family Education for ongoing care 1 15  - 0 Complex (extensive) Patient / Family Education for ongoing care X- 1 10 Staff obtains Chiropractor, Records, T Results / Process Orders est  - 0 Staff telephones HHA,  Nursing Homes / Clarify orders / etc  - 0 Routine Transfer to another Facility (non-emergent condition)  - 0 Routine Hospital Admission (non-emergent condition)  - 0 New Admissions / Manufacturing engineer / Ordering NPWT Apligraf, etc. ,  - 0 Emergency Hospital Admission (emergent condition) X- 1 10 Simple Discharge Coordination  - 0 Complex (extensive)  Discharge Coordination PROCESS - Special Needs []  - 0 Pediatric / Minor Patient Management []  - 0 Isolation Patient Management []  - 0 Hearing / Language / Visual special needs []  - 0 Assessment of Community assistance (transportation, D/C planning, etc.) []  - 0 Additional assistance / Altered mentation []  - 0 Support Surface(s) Assessment (bed, cushion, seat, etc.) INTERVENTIONS - Wound Cleansing / Measurement X - Simple Wound Cleansing - one wound 1 5 []  - 0 Complex Wound Cleansing - multiple wounds X- 1 5 Wound Imaging (photographs - any number of wounds) []  - 0 Wound Tracing (instead of photographs) X- 1 5 Simple Wound Measurement - one wound []  - 0 Complex Wound Measurement - multiple wounds INTERVENTIONS - Wound Dressings []  - 0 Small Wound Dressing one or multiple wounds X- 1 15 Medium Wound Dressing one or multiple wounds []  - 0 Large Wound Dressing one or multiple wounds X- 1 5 Application of Medications - topical Adriano, Catherine Powell ([id number]) [id number] Page 3 of 8 []  - 0 Application of Medications - injection INTERVENTIONS - Miscellaneous []  - 0 External ear exam []  - 0 Specimen Collection (cultures, biopsies, blood, body fluids, etc.) []  - 0 Specimen(s) / Culture(s) sent or taken to Lab for analysis []  - 0 Patient Transfer (multiple staff / Michiel Sites Lift / Similar devices) []  - 0 Simple Staple / Suture removal (25 or less) []  - 0 Complex Staple / Suture removal (26 or more) []  - 0 Hypo / Hyperglycemic Management (close monitor of Blood Glucose) []  -  0 Ankle / Brachial Index (ABI) - do not check if billed separately X- 1 5 Vital Signs Has the patient been seen at the hospital within the last three years: Yes Total Score: 120 Level Of Care: New/Established - Level 4 Electronic Signature(s) Signed: 05/16/2022 4:49:59 PM By: Shawn Stall RN, BSN Entered By: Shawn Stall on 05/16/2022 10:26:51 -------------------------------------------------------------------------------- Encounter Discharge Information Details Patient Name: Date of Service: Catherine Hock Powell. 05/16/2022 9:30 A M Medical Record Number: [id number] Patient Account Number: [id number] Date of Birth/Sex: Treating RN: February 18, 1966 (57 y.o. [name] Primary Care Luismario Coston: Jerre Simon Other Clinician: Referring Aleina Burgio: Treating Dandra Shambaugh/Extender: Gearldine Shown in Treatment: 0 Encounter Discharge Information Items Discharge Condition: Stable Ambulatory Status: Ambulatory Discharge Destination: Home Transportation: Private Auto Accompanied By: son Schedule Follow-up Appointment: Yes Clinical Summary of Care: Electronic Signature(s) Signed: 05/16/2022 4:49:59 PM By: Shawn Stall RN, BSN Entered By: Shawn Stall on 05/16/2022 10:27:19 -------------------------------------------------------------------------------- Lower Extremity Assessment Details Patient Name: Date of Service: Catherine Hock Powell. 05/16/2022 9:30 A M Medical Record Number: [id number] Patient Account Number: [id number] Date of Birth/Sex: Treating RN: Feb 21, 1966 (57 y.o. F) Primary Care Kaly Mcquary: Jerre Simon Other Clinician: Referring Noble Cicalese: Treating Ajai Harville/Extender: Gearldine Shown in Treatment: 0 Edema Assessment Assessed: [Left: No] [Right: No] [Left: Edema] [Right: :] Calf Left: Right: Point of Measurement: 35 cm From Medial Instep 67 cm Ankle Keys, Aundraya Powell ([id number]) [phone].pdf Page 4 of 8 Left:  Right: Point of Measurement: 9 cm From Medial Instep 37.5 cm Knee To Floor Left: Right: From Medial Instep 48 cm Vascular Assessment Pulses: Dorsalis Pedis Palpable: [Right:Yes] Electronic Signature(s) Signed: 05/16/2022 3:53:55 PM By: Thayer Dallas Entered By: Thayer Dallas on 05/16/2022 10:00:13 -------------------------------------------------------------------------------- Multi Wound Chart Details Patient Name: Date of Service: Catherine Hock Powell. 05/16/2022 9:30 A M Medical Record Number: [id number] Patient Account Number: [id number] Date of Birth/Sex: Treating RN: [birth year] (57 y.o. F) Primary Care Idalee Foxworthy: Elliot Gurney,  John Other Clinician: Referring Lesette Frary: Treating Anicka Stuckert/Extender: Gearldine Shown in Treatment: 0 Vital Signs Height(in): 68 Pulse(bpm): 80 Weight(lbs): 421 Blood Pressure(mmHg): 169/90 Body Mass Index(BMI): 64 Temperature(F): 97.8 Respiratory Rate(breaths/min): 20 [9:Photos:] [[name]:[name]] Right, Posterior Lower Leg [name] [name] Wound Location: Gradually Appeared [name] [name] Wounding Event: Lymphedema [name] [name] Primary Etiology: Lymphedema, Asthma, Hypertension, [name] [name] Comorbid History: Peripheral Venous Disease, Osteoarthritis 05/10/2022 [name] [name] Date Acquired: 0 [name] [name] Weeks of Treatment: Open [name] [name] Wound Status: No [name] [name] Wound Recurrence: 11x11.5x0.1 [name] [name] Measurements L x W x D (cm) 99.353 [name] [name] A (cm) : rea 9.935 [name] [name] Volume (cm) : Full Thickness Without Exposed [name] [name] Classification: Support Structures Large [name] [name] Exudate Amount: Serosanguineous [name] [name] Exudate Type: red, brown [name] [name] Exudate Color: Distinct, outline attached [name] [name] Wound Margin: Large (67-100%) [name] [name] Granulation Amount: Red [name] [name] Granulation Quality: None Present (0%) [name] [name] Necrotic Amount: Fat Layer (Subcutaneous Tissue): Yes [name] [name] Exposed Structures: Fascia: No Tendon: No Muscle: No Joint: No Bone:  No Medium (34-66%) [name] [name] EpithelializationSOMAYA, Catherine Powell ([id number]) [id number] Page 5 of 8 No Abnormalities Noted [name] [name] Periwound Skin Texture: Maceration: Yes [name] [name] Periwound Skin Moisture: No Abnormalities Noted [name] [name] Periwound Skin Color: Yes [name] [name] Tenderness on Palpation: Treatment Notes Wound #9 (Lower Leg) Wound Laterality: Right, Posterior Cleanser Soap and Water Discharge Instruction: May shower and wash wound with dial antibacterial soap and water prior to dressing change. Peri-Wound Care Topical Triamcinolone Discharge Instruction: Apply Triamcinolone mixed with the desitin (zinc oxide) pick up at pharmacy. zinc oxide (Desitin) over the counter Discharge Instruction: Pick up zinc oxide from over the counter and mix with steroid cream and apply to wound bed. Primary Dressing Secondary Dressing ABD Pad, 8x10 Discharge Instruction: Apply over primary dressing as directed. Secured With [employer], 4.5x3.1 (in/yd) Discharge Instruction: Secure with Kerlix as directed. Compression Wrap Juxtalite HD 30-77mmHg Discharge Instruction: apply in the morning and remove at night. Compression Stockings Add-Ons Electronic Signature(s) Signed: 05/16/2022 12:51:47 PM By: Geralyn Corwin DO Entered By: Geralyn Corwin on 05/16/2022 11:25:39 -------------------------------------------------------------------------------- Multi-Disciplinary Care Plan Details Patient Name: Date of Service: Catherine Hock Powell. 05/16/2022 9:30 A M Medical Record Number: [id number] Patient Account Number: [id number] Date of Birth/Sex: Treating RN: 18-Oct-1965 (57 y.o. [name] Primary Care Cherissa Hook: Jerre Simon Other Clinician: Referring Sequan Auxier: Treating Mohini Heathcock/Extender: Gearldine Shown in Treatment: 0 Active Inactive Electronic Signature(s) Signed: 07/02/2022 9:50:35 AM By: Shawn Stall RN, BSN Previous  Signature: 05/16/2022 4:49:59 PM Version By: Shawn Stall RN, BSN Entered By: Shawn Stall on 07/02/2022 09:50:35 -------------------------------------------------------------------------------- Pain Assessment Details Patient Name: Date of Service: Catherine Hock Powell. 05/16/2022 9:30 A M Medical Record Number: [id number] Patient Account Number: [id number] Date of Birth/Sex: Treating RN: 1966-01-15 (57 y.o. [name] Primary Care Sascha Baugher: Jerre Simon Other Clinician: [name] ([id number]) [id number] Page 6 of 8 Referring Bertil Brickey: Treating Uzoma Vivona/Extender: Gearldine Shown in Treatment: 0 Active Problems Location of Pain Severity and Description of Pain Patient Has Paino Yes Site Locations Rate the pain. Current Pain Level: 7 Pain Management and Medication Current Pain Management: Electronic Signature(s) Signed: 05/16/2022 4:49:59 PM By: Shawn Stall RN, BSN Entered By: Shawn Stall on 05/16/2022 10:19:58 -------------------------------------------------------------------------------- Patient/Caregiver Education Details Patient Name: Date of Service: Catherine Powell 3/7/2024andnbsp9:30 A M Medical Record Number: [id number] Patient Account Number: [id number] Date of Birth/Gender: Treating RN: 11/10/1965 (57 y.o. F) [name],  ZOXWR Primary Care Physician: Jerre Simon Other Clinician: Referring Physician: Treating Physician/Extender: Gearldine Shown in Treatment: 0 Education Assessment Education Provided To: Patient Education Topics Provided Wound/Skin Impairment: Handouts: Caring for Your Ulcer Methods: Explain/Verbal Responses: Reinforcements needed Electronic Signature(s) Signed: 05/16/2022 4:49:59 PM By: Shawn Stall RN, BSN Entered By: Shawn Stall on 05/16/2022 10:21:19 -------------------------------------------------------------------------------- Wound Assessment  Details Patient Name: Date of Service: Catherine Powell. 05/16/2022 9:30 A M Medical Record Number: [id number] Patient Account Number: [id number] Date of Birth/Sex: Treating RN: [birth year] (57 y.o. Orville Govern Primary Care Shriley Joffe: Jerre Simon Other Clinician: Referring Galilea Quito: Treating Nezar Buckles/Extender: Margarito Liner Superior, Arna Medici ([id number]) [id number] Page 7 of 8 Weeks in Treatment: 0 Wound Status Wound Number: 9 Primary Lymphedema Etiology: Wound Location: Right, Posterior Lower Leg Wound Open Wounding Event: Gradually Appeared Status: Date Acquired: 05/10/2022 Comorbid Lymphedema, Asthma, Hypertension, Peripheral Venous Weeks Of Treatment: 0 History: Disease, Osteoarthritis Clustered Wound: No Photos Wound Measurements Length: (cm) 11 Width: (cm) 11.5 Depth: (cm) 0.1 Area: (cm) 99.353 Volume: (cm) 9.935 % Reduction in Area: % Reduction in Volume: Epithelialization: Medium (34-66%) Tunneling: No Undermining: No Wound Description Classification: Full Thickness Without Exposed Support Wound Margin: Distinct, outline attached Exudate Amount: Large Exudate Type: Serosanguineous Exudate Color: red, brown Structures Foul Odor After Cleansing: No Slough/Fibrino No Wound Bed Granulation Amount: Large (67-100%) Exposed Structure Granulation Quality: Red Fascia Exposed: No Necrotic Amount: None Present (0%) Fat Layer (Subcutaneous Tissue) Exposed: Yes Tendon Exposed: No Muscle Exposed: No Joint Exposed: No Bone Exposed: No Periwound Skin Texture Texture Color No Abnormalities Noted: Yes No Abnormalities Noted: Yes Moisture Temperature / Pain No Abnormalities Noted: No Tenderness on Palpation: Yes Maceration: Yes Electronic Signature(s) Signed: 05/16/2022 4:49:59 PM By: Shawn Stall RN, BSN Signed: 05/16/2022 5:08:37 PM By: Redmond Pulling RN, BSN Entered By: Shawn Stall on 05/16/2022  11:09:55 -------------------------------------------------------------------------------- Vitals Details Patient Name: Date of Service: Catherine Hock Powell. 05/16/2022 9:30 A M Medical Record Number: [id number] Patient Account Number: [id number] Date of Birth/Sex: Treating RN: [birth year] (57 y.o. F) Primary Care Dallis Darden: Jerre Simon Other Clinician: Referring Melonie Germani: Treating Pacey Altizer/Extender: Gearldine Shown in Treatment: 0 Vital Signs ROMAN, SANDALL ([id number]) [id number] Page 8 of 8 Time Taken: 09:28 Temperature (F): 97.8 Height (in): 68 Pulse (bpm): 80 Source: Stated Respiratory Rate (breaths/min): 20 Weight (lbs): 421 Blood Pressure (mmHg): 169/90 Source: Stated Reference Range: 80 - 120 mg / dl Body Mass Index (BMI): 64 Electronic Signature(s) Signed: 05/16/2022 3:53:55 PM By: Thayer Dallas Entered By: Thayer Dallas on 05/16/2022 09:31:51

## 2022-05-18 NOTE — Progress Notes (Signed)
TYREESE, KNIEP ([id number]) [id number] Page 1 of 9 Visit Report for 05/16/2022 Chief Complaint Document Details Patient Name: Date of Service: Catherine Powell 05/16/2022 9:30 A M Medical Record Number: [id number] Patient Account Number: [id number] Date of Birth/Sex: Treating RN: 1965/05/09 (57 y.o. F) Primary Care Provider: Alen Bleacher Other Clinician: Referring Provider: Treating Provider/Extender: Jannet Askew in Treatment: 0 Information Obtained from: Patient Chief Complaint Right posterior leg wound Electronic Signature(s) Signed: 05/16/2022 12:51:47 PM By: Kalman Shan DO Entered By: Kalman Shan on 05/16/2022 11:25:49 -------------------------------------------------------------------------------- HPI Details Patient Name: Date of Service: Catherine Curling H. 05/16/2022 9:30 A M Medical Record Number: [id number] Patient Account Number: [id number] Date of Birth/Sex: Treating RN: 06-Oct-1965 (57 y.o. F) Primary Care Provider: Alen Bleacher Other Clinician: Referring Provider: Treating Provider/Extender: Jannet Askew in Treatment: 0 History of Present Illness HPI Description: Admission 04/20/2021 Ms. Kingslee Ogier is a 57 year old female with a past medical history of morbid obesity with BMI of 66 and wheelchair dependent due to this, lymphedema and hypertension that presents to the clinic for a 46-monthhistory of nonhealing ulcer to the right calf. She states this started spontaneously and has been using Unna boots and Xeroform with benefit She saw vein and vascular who recommended bariatric surgery. She currently denies systemic signs of infection. 2/17; patient presents for follow-up. She tolerated the compression wrap well. She does report increased tenderness to the surrounding wound bed. 2/24; patient presents for follow-up. She reports taking Keflex with improvement in her symptoms.  She has developed some vaginal itching and reports a history of yeast infection after taking antibiotics. She has her juxta lite compression today. 3/27; patient presents for follow-up. Patient states that she has had 2 areas open up to the right lower extremity over the past week. She was started on Keflex by her primary care physician for potential cellulitis. She has been using her juxta light compression daily without issues. She also has a compression stocking to the left lower extremity that she has been using since discharge from clinic on 2/24. 4/4; patient presents for follow-up. She reports that the compression wrap placed in clinic last visit slid down the following day. She has been using silver alginate to the wound site and using her juxta lite compression daily. She has no issues or complaints today. She would like a note to be out of work so that she can elevate her legs to help heal the wounds. 4/11; the patient arrives today with the area on her right posterior calf totally closed. She has severe lymphedema but has juxta lite stockings and new compression pumps arrive this morning she has not yet used them. 4/26; patient presents with reopening of her previous wounds on the right posterior calf Over the past week. She states she has been using her juxta lite compressions and lymphedema pumps. She is not on a diuretic. 5/18; patient presents for follow-up. She continues to have a wound to the right posterior calf and has developed new wounds to the left lower extremity. She states these happened spontaneously over the past week. She has a juxta light compression however has not been wearing this to the left lower extremity. She has been wearing her juxta lite to the right lower extremity. She denies signs of infection. 5/25; patient presents for follow-up. The compression wrap slid off 1 to 2 days after it was placed in office last week. She has been using silver alginate daily to  the wound beds. She has also been wearing her juxta light compression. She currently denies signs of infection. 6/8; patient with bilateral lymphedema wounds on the right greater than left posterior calf. In fact the areas on the left are close to being closed. There is some degree of venous inflammation and skin changes from chronic edema. She is using Iodoflex to the wounds She has pumps at home that she is using twice a day. She has an appointment to make custom made stockings. Apparently the juxta lites are not staying on very well and even our compression wraps do not stay up for the patient 6/27; patient presents for follow-up. She has been using Iodosorb to the wound beds. Along with her juxta lite compressions daily. She states she has been using her lymphedema pumps 3 times daily as well. Unfortunately she does not currently have health insurance. She has not been able to get her compression garments fitted due to this reason. She currently wants to remain out of work to help these wounds heal. She has no issues or complaints today. 7/11; patient presents for follow-up. She has been using Iodosorb to the wound beds. She uses her juxta lite compressions daily. One of the wounds on the right HAZLEE, MICKLOS H ([id number]) [id number] Page 2 of 9 posterior leg is healed. She would like to return back to work. At this time she does not want to obtain her custom compression garments due to financial reasons. 7/25; patient presents for follow-up. We have been using Iodoflex to the wound beds. Her wounds are healed today. She has her juxta lite compressions with her. She states she is going to try and get her custom compression garments. At this time she can return back to work full-time. 8/7; patient presents for follow-up. At last clinic visit the patient was discharged due to closed wound. She has developed a new wound to the posterior right leg limited to skin  breakdown. She has been using hydrogen peroxide in her juxta light. She states she is using her lymphedema pumps several times a day. She would like a note to be out of work. 8/21; patient presents for follow-up. She has been using Iodosorb to the wound bed along with her compression Velcro wraps. She has no open wounds to the left lower extremity. 05/16/2022 Catherine Powell is a 57 year old female with a past medical history of lymphedema and venous insufficiency that has waxing and waning of wound healing to the right posterior leg. She has lymphedema pumps and uses these twice daily. She has juxta lite compressions which she states she uses daily. She states that 6 months ago she had skin breakdown to the posterior leg that just has not resolved. She has tried Xeroform, Tegaderm, Vaseline, silver alginate with little benefit. She currently denies signs of infection. Electronic Signature(s) Signed: 05/16/2022 12:51:47 PM By: Kalman Shan DO Entered By: Kalman Shan on 05/16/2022 11:27:46 -------------------------------------------------------------------------------- Physical Exam Details Patient Name: Date of Service: Catherine Powell 05/16/2022 9:30 A M Medical Record Number: [id number] Patient Account Number: [id number] Date of Birth/Sex: Treating RN: Dec 22, 1965 (57 y.o. F) Primary Care Provider: Alen Bleacher Other Clinician: Referring Provider: Treating Provider/Extender: Jannet Askew in Treatment: 0 Constitutional respirations regular, non-labored and within target range for patient.. Cardiovascular 2+ dorsalis pedis/posterior tibialis pulses. Psychiatric pleasant and cooperative. Notes Right lower extremity: T the posterior aspect there is a large area of skin breakdown. Lymphedema stage III. No signs of surrounding soft tissue infection.  o Electronic Signature(s) Signed: 05/16/2022 12:51:47 PM By: Kalman Shan DO Entered By: Kalman Shan on 05/16/2022 11:28:23 -------------------------------------------------------------------------------- Physician Orders Details Patient Name: Date of Service: Catherine Curling H. 05/16/2022 9:30 A M Medical Record Number: [id number] Patient Account Number: [id number] Date of Birth/Sex: Treating RN: [birth year] (57 y.o. [name] Primary Care Provider: Alen Bleacher Other Clinician: Referring Provider: Treating Provider/Extender: Jannet Askew in Treatment: 0 Verbal / Phone Orders: No Diagnosis Coding ICD-10 Coding Code Description [phone] Non-pressure chronic ulcer of other part of right lower leg with fat layer exposed I89.0 Lymphedema, not elsewhere classified Z99.3 Dependence on wheelchair E66.01 Morbid (severe) obesity due to excess calories Follow-up Appointments ppointment in 1 week. - Dr. Heber [place] 2pm 05/23/2022 room 8 Return A AUREA, BUNCE H ([id number]) [id number] Page 3 of 9 Other: - Pick up steroid cream at pharmacy. Edema Control - Lymphedema / SCD / Other Lymphedema Pumps. Use Lymphedema pumps on leg(s) 2-3 times a day for 45-60 minutes. If wearing any wraps or hose, do not remove them. Continue exercising as instructed. - 3 times a day for an hour each day. Elevate legs to the level of the heart or above for 30 minutes daily and/or when sitting for 3-4 times a day throughout the day. Avoid standing for long periods of time. Patient to wear own compression stockings every day. Exercise regularly Compression stocking or Garment 30-40 mm/Hg pressure to: - juxtalite HD apply daily. Additional Orders / Instructions Follow Nutritious Diet - Continue to loose weight. Wound Treatment Wound #9 - Lower Leg Wound Laterality: Right, Posterior Cleanser: Soap and Water 1 x Per Day/30 Days Discharge Instructions: May shower and wash wound with dial antibacterial soap and water prior to dressing change. Topical:  Triamcinolone 1 x Per Day/30 Days Discharge Instructions: Apply Triamcinolone mixed with the desitin (zinc oxide) pick up at pharmacy. Topical: zinc oxide (Desitin) over the counter 1 x Per Day/30 Days Discharge Instructions: Pick up zinc oxide from over the counter and mix with steroid cream and apply to wound bed. Secondary Dressing: ABD Pad, 8x10 1 x Per Day/30 Days Discharge Instructions: Apply over primary dressing as directed. Secured With: [employer], 4.5x3.1 (in/yd) 1 x Per Day/30 Days Discharge Instructions: Secure with Kerlix as directed. Compression Wrap: Juxtalite HD 30-74mHg 1 x Per Day/30 Days Discharge Instructions: apply in the morning and remove at night. Patient Medications llergies: tolmetin sodium A Notifications Medication Indication Start End 05/16/2022 triamcinolone acetonide DOSE 1 - topical 0.1 % cream - 1 cream topical once daily Electronic Signature(s) Signed: 05/16/2022 12:51:47 PM By: HKalman ShanDO Previous Signature: 05/16/2022 10:29:47 AM Version By: HKalman ShanDO Entered By: HKalman Shanon 05/16/2022 11:28:31 -------------------------------------------------------------------------------- Problem List Details Patient Name: Date of Service: BFortunato CurlingH. 05/16/2022 9:30 A M Medical Record Number: [id number] Account Number: [id number] of Birth/Sex: Treating RN: 107-Mar-1967(57y.o. F) Primary Care Provider: NAlen BleacherOther Clinician: Referring Provider: Treating Provider/Extender: HJannet Askewin Treatment: 0 Active Problems ICD-10 Encounter Code Description Active Date MDM Diagnosis L97.811 Non-pressure chronic ulcer of other part of right lower leg limited to breakdown 05/16/2022 No Yes of skin I89.0 Lymphedema, not elsewhere classified 05/16/2022 No Yes Z99.3 Dependence on wheelchair 05/16/2022 No Yes Acheampong, Evangelynn H ([id number] Page 4 of  9 E66.01 Morbid (severe) obesity due to excess calories 05/16/2022 No Yes Inactive Problems Resolved Problems Electronic Signature(s) Signed: 05/16/2022 12:51:47 PM By: HKalman ShanDO Entered By:  Kalman Shan on 05/16/2022 11:25:35 -------------------------------------------------------------------------------- Progress Note Details Patient Name: Date of Service: Catherine Powell 05/16/2022 9:30 A M Medical Record Number: [id number] Patient Account Number: [id number] Date of Birth/Sex: Treating RN: 1966-01-19 (57 y.o. F) Primary Care Provider: Alen Bleacher Other Clinician: Referring Provider: Treating Provider/Extender: Jannet Askew in Treatment: 0 Subjective Chief Complaint Information obtained from Patient Right posterior leg wound History of Present Illness (HPI) Admission 04/20/2021 Catherine Powell is a 57 year old female with a past medical history of morbid obesity with BMI of 66 and wheelchair dependent due to this, lymphedema and hypertension that presents to the clinic for a 71-monthhistory of nonhealing ulcer to the right calf. She states this started spontaneously and has been using Unna boots and Xeroform with benefit She saw vein and vascular who recommended bariatric surgery. She currently denies systemic signs of infection. 2/17; patient presents for follow-up. She tolerated the compression wrap well. She does report increased tenderness to the surrounding wound bed. 2/24; patient presents for follow-up. She reports taking Keflex with improvement in her symptoms. She has developed some vaginal itching and reports a history of yeast infection after taking antibiotics. She has her juxta lite compression today. 3/27; patient presents for follow-up. Patient states that she has had 2 areas open up to the right lower extremity over the past week. She was started on Keflex by her primary care physician for potential cellulitis. She has been  using her juxta light compression daily without issues. She also has a compression stocking to the left lower extremity that she has been using since discharge from clinic on 2/24. 4/4; patient presents for follow-up. She reports that the compression wrap placed in clinic last visit slid down the following day. She has been using silver alginate to the wound site and using her juxta lite compression daily. She has no issues or complaints today. She would like a note to be out of work so that she can elevate her legs to help heal the wounds. 4/11; the patient arrives today with the area on her right posterior calf totally closed. She has severe lymphedema but has juxta lite stockings and new compression pumps arrive this morning she has not yet used them. 4/26; patient presents with reopening of her previous wounds on the right posterior calf Over the past week. She states she has been using her juxta lite compressions and lymphedema pumps. She is not on a diuretic. 5/18; patient presents for follow-up. She continues to have a wound to the right posterior calf and has developed new wounds to the left lower extremity. She states these happened spontaneously over the past week. She has a juxta light compression however has not been wearing this to the left lower extremity. She has been wearing her juxta lite to the right lower extremity. She denies signs of infection. 5/25; patient presents for follow-up. The compression wrap slid off 1 to 2 days after it was placed in office last week. She has been using silver alginate daily to the wound beds. She has also been wearing her juxta light compression. She currently denies signs of infection. 6/8; patient with bilateral lymphedema wounds on the right greater than left posterior calf. In fact the areas on the left are close to being closed. There is some degree of venous inflammation and skin changes from chronic edema. She is using Iodoflex to the  wounds She has pumps at home that she is using twice a day. She has  an appointment to make custom made stockings. Apparently the juxta lites are not staying on very well and even our compression wraps do not stay up for the patient 6/27; patient presents for follow-up. She has been using Iodosorb to the wound beds. Along with her juxta lite compressions daily. She states she has been using her lymphedema pumps 3 times daily as well. Unfortunately she does not currently have health insurance. She has not been able to get her compression garments fitted due to this reason. She currently wants to remain out of work to help these wounds heal. She has no issues or complaints today. 7/11; patient presents for follow-up. She has been using Iodosorb to the wound beds. She uses her juxta lite compressions daily. One of the wounds on the right posterior leg is healed. She would like to return back to work. At this time she does not want to obtain her custom compression garments due to financial reasons. 7/25; patient presents for follow-up. We have been using Iodoflex to the wound beds. Her wounds are healed today. She has her juxta lite compressions with her. She states she is going to try and get her custom compression garments. At this time she can return back to work full-time. TINLEY, CHREST ([id number]) [id number] Page 5 of 9 8/7; patient presents for follow-up. At last clinic visit the patient was discharged due to closed wound. She has developed a new wound to the posterior right leg limited to skin breakdown. She has been using hydrogen peroxide in her juxta light. She states she is using her lymphedema pumps several times a day. She would like a note to be out of work. 8/21; patient presents for follow-up. She has been using Iodosorb to the wound bed along with her compression Velcro wraps. She has no open wounds to the left lower extremity. 05/16/2022 Ms. Mosella Falter is a 57 year old female with a past medical history of lymphedema and venous insufficiency that has waxing and waning of wound healing to the right posterior leg. She has lymphedema pumps and uses these twice daily. She has juxta lite compressions which she states she uses daily. She states that 6 months ago she had skin breakdown to the posterior leg that just has not resolved. She has tried Xeroform, Tegaderm, Vaseline, silver alginate with little benefit. She currently denies signs of infection. Patient History Information obtained from Patient. Allergies tolmetin sodium (Reaction: Shortness of breath) Family History Cancer - Father,Mother,Siblings, Diabetes - Siblings,Father, Hypertension - Father, Lung Disease - Father, Thyroid Problems - Siblings, No family history of Heart Disease, Hereditary Spherocytosis, Kidney Disease, Seizures, Stroke, Tuberculosis. Social History Never smoker, Marital Status - Married, Alcohol Use - Never, Drug Use - No History, Caffeine Use - Daily - coffee. Medical History Eyes Denies history of Cataracts, Glaucoma, Optic Neuritis Ear/Nose/Mouth/Throat Denies history of Chronic sinus problems/congestion, Middle ear problems Hematologic/Lymphatic Patient has history of Lymphedema Denies history of Anemia, Hemophilia, Human Immunodeficiency Virus, Sickle Cell Disease Respiratory Patient has history of Asthma Denies history of Aspiration, Chronic Obstructive Pulmonary Disease (COPD), Pneumothorax, Sleep Apnea, Tuberculosis Cardiovascular Patient has history of Hypertension, Peripheral Venous Disease Denies history of Angina, Arrhythmia, Congestive Heart Failure, Coronary Artery Disease, Deep Vein Thrombosis, Hypotension, Myocardial Infarction, Peripheral Arterial Disease, Phlebitis, Vasculitis Gastrointestinal Denies history of Cirrhosis , Colitis, Crohnoos, Hepatitis A, Hepatitis B, Hepatitis C Endocrine Denies history of Type I Diabetes, Type II  Diabetes Genitourinary Denies history of End Stage Renal Disease Integumentary (Skin) Denies history of History of  Burn Musculoskeletal Patient has history of Osteoarthritis Denies history of Gout, Rheumatoid Arthritis, Osteomyelitis Neurologic Denies history of Dementia, Neuropathy, Quadriplegia, Paraplegia, Seizure Disorder Oncologic Denies history of Received Chemotherapy, Received Radiation Hospitalization/Surgery History - knee right arthoscopy. - appendectomy. - cardiac cath. - cervical fusion. - cholecystectomy. - wisdom tooth. Medical A Surgical History Notes nd Hematologic/Lymphatic Hypercholesterolemia Gastrointestinal GERD Musculoskeletal Degenerative joint disease of knees Review of Systems (ROS) Constitutional Symptoms (General Health) Denies complaints or symptoms of Fatigue, Fever, Chills, Marked Weight Change. Eyes Complains or has symptoms of Glasses / Contacts. Denies complaints or symptoms of Dry Eyes, Vision Changes. Ear/Nose/Mouth/Throat Denies complaints or symptoms of Chronic sinus problems or rhinitis. Respiratory Denies complaints or symptoms of Chronic or frequent coughs, Shortness of Breath. Cardiovascular Denies complaints or symptoms of Chest pain. Gastrointestinal Denies complaints or symptoms of Frequent diarrhea, Nausea, Vomiting. Endocrine Denies complaints or symptoms of Heat/cold intolerance. Genitourinary Denies complaints or symptoms of Frequent urination. Integumentary (Skin) Complains or has symptoms of Wounds - right leg. Musculoskeletal Denies complaints or symptoms of Muscle Pain, Muscle Weakness. Neurologic Catherine, Powell ([id number]) [id number] Page 6 of 9 Denies complaints or symptoms of Numbness/parasthesias. Psychiatric Denies complaints or symptoms of Claustrophobia. Objective Constitutional respirations regular, non-labored and within target range for patient.. Vitals Time Taken: 9:28  AM, Height: 68 in, Source: Stated, Weight: 421 lbs, Source: Stated, BMI: 64, Temperature: 97.8 F, Pulse: 80 bpm, Respiratory Rate: 20 breaths/min, Blood Pressure: 169/90 mmHg. Cardiovascular 2+ dorsalis pedis/posterior tibialis pulses. Psychiatric pleasant and cooperative. General Notes: Right lower extremity: T the posterior aspect there is a large area of skin breakdown. Lymphedema stage III. No signs of surrounding soft o tissue infection. Integumentary (Hair, Skin) Wound #9 status is Open. Original cause of wound was Gradually Appeared. The date acquired was: 05/10/2022. The wound is located on the Right,Posterior Lower Leg. The wound measures 11cm length x 11.5cm width x 0.1cm depth; 99.353cm^2 area and 9.935cm^3 volume. There is Fat Layer (Subcutaneous Tissue) exposed. There is no tunneling or undermining noted. There is a large amount of serosanguineous drainage noted. The wound margin is distinct with the outline attached to the wound base. There is large (67-100%) red granulation within the wound bed. There is no necrotic tissue within the wound bed. The periwound skin appearance had no abnormalities noted for texture. The periwound skin appearance had no abnormalities noted for color. The periwound skin appearance exhibited: Maceration. The periwound has tenderness on palpation. Assessment Active Problems ICD-10 Non-pressure chronic ulcer of other part of right lower leg limited to breakdown of skin Lymphedema, not elsewhere classified Dependence on wheelchair Morbid (severe) obesity due to excess calories Patient presents with a nonhealing ulcer to the posterior right leg in the setting of lymphedema, venous insufficiency. I recommended continuing lymphedema pump usage and her juxta light compression daily. Over the wound bed I recommended zinc oxide with triamcinolone cream and ABD pads. The wound is very superficial. She does not do well without compression in office wraps due  to the shape of her legs. This usually creates more problems for her. We discussed the importance of weight loss for her management and wound healing. She states she has lost 20 pounds in the past 6 months and is on weight loss medication. Plan Follow-up Appointments: Return Appointment in 1 week. - Dr. Heber [place] 2pm 05/23/2022 room 8 Other: - Pick up steroid cream at pharmacy. Edema Control - Lymphedema / SCD / Other: Lymphedema Pumps. Use Lymphedema pumps on leg(s) 2-3 times a day for  45-60 minutes. If wearing any wraps or hose, do not remove them. Continue exercising as instructed. - 3 times a day for an hour each day. Elevate legs to the level of the heart or above for 30 minutes daily and/or when sitting for 3-4 times a day throughout the day. Avoid standing for long periods of time. Patient to wear own compression stockings every day. Exercise regularly Compression stocking or Garment 30-40 mm/Hg pressure to: - juxtalite HD apply daily. Additional Orders / Instructions: Follow Nutritious Diet - Continue to loose weight. The following medication(s) was prescribed: triamcinolone acetonide topical 0.1 % cream 1 1 cream topical once daily starting 05/16/2022 WOUND #9: - Lower Leg Wound Laterality: Right, Posterior Cleanser: Soap and Water 1 x Per Day/30 Days Discharge Instructions: May shower and wash wound with dial antibacterial soap and water prior to dressing change. Topical: Triamcinolone 1 x Per Day/30 Days Discharge Instructions: Apply Triamcinolone mixed with the desitin (zinc oxide) pick up at pharmacy. Topical: zinc oxide (Desitin) over the counter 1 x Per Day/30 Days Discharge Instructions: Pick up zinc oxide from over the counter and mix with steroid cream and apply to wound bed. CHANDELLE, DINGA ([id number]) [id number] Page 7 of 9 Secondary Dressing: ABD Pad, 8x10 1 x Per Day/30 Days Discharge Instructions: Apply over primary dressing as  directed. Secured With: [employer], 4.5x3.1 (in/yd) 1 x Per Day/30 Days Discharge Instructions: Secure with Kerlix as directed. Compression Wrap: Juxtalite HD 30-57mHg 1 x Per Day/30 Days Discharge Instructions: apply in the morning and remove at night. 1. Zinc oxide with triamcinolone cream 2. ABD pads 3. Juxta light compression daily 4. Follow-up in 1 week Electronic Signature(s) Signed: 05/16/2022 12:51:47 PM By: HKalman ShanDO Entered By: HKalman Shanon 05/16/2022 11:30:33 -------------------------------------------------------------------------------- HxROS Details Patient Name: Date of Service: BFortunato CurlingH. 05/16/2022 9:30 A M Medical Record Number: [id number] Account Number: [id number] of Birth/Sex: Treating RN: [id number](57y.o. FDebby BudPrimary Care Provider: NAlen BleacherOther Clinician: Referring Provider: Treating Provider/Extender: HJannet Askewin Treatment: 0 Information Obtained From Patient Constitutional Symptoms (General Health) Complaints and Symptoms: Negative for: Fatigue; Fever; Chills; Marked Weight Change Eyes Complaints and Symptoms: Positive for: Glasses / Contacts Negative for: Dry Eyes; Vision Changes Medical History: Negative for: Cataracts; Glaucoma; Optic Neuritis Ear/Nose/Mouth/Throat Complaints and Symptoms: Negative for: Chronic sinus problems or rhinitis Medical History: Negative for: Chronic sinus problems/congestion; Middle ear problems Respiratory Complaints and Symptoms: Negative for: Chronic or frequent coughs; Shortness of Breath Medical History: Positive for: Asthma Negative for: Aspiration; Chronic Obstructive Pulmonary Disease (COPD); Pneumothorax; Sleep Apnea; Tuberculosis Cardiovascular Complaints and Symptoms: Negative for: Chest pain Medical History: Positive for: Hypertension; Peripheral Venous Disease Negative for: Angina; Arrhythmia; Congestive  Heart Failure; Coronary Artery Disease; Deep Vein Thrombosis; Hypotension; Myocardial Infarction; Peripheral Arterial Disease; Phlebitis; Vasculitis Gastrointestinal Complaints and Symptoms: Negative for: Frequent diarrhea; Nausea; Vomiting Medical History:TEREZ, BALAZS([id number] Page 8 of 9 Negative for: Cirrhosis ; Colitis; Crohns; Hepatitis A; Hepatitis B; Hepatitis C Past Medical History Notes: GERD Endocrine Complaints and Symptoms: Negative for: Heat/cold intolerance Medical History: Negative for: Type I Diabetes; Type II Diabetes Genitourinary Complaints and Symptoms: Negative for: Frequent urination Medical History: Negative for: End Stage Renal Disease Integumentary (Skin) Complaints and Symptoms: Positive for: Wounds - right leg Medical History: Negative for: History of Burn Musculoskeletal Complaints and Symptoms: Negative for: Muscle Pain; Muscle Weakness Medical History: Positive for: Osteoarthritis Negative for: Gout; Rheumatoid Arthritis; Osteomyelitis  Past Medical History Notes: Degenerative joint disease of knees Neurologic Complaints and Symptoms: Negative for: Numbness/parasthesias Medical History: Negative for: Dementia; Neuropathy; Quadriplegia; Paraplegia; Seizure Disorder Psychiatric Complaints and Symptoms: Negative for: Claustrophobia Hematologic/Lymphatic Medical History: Positive for: Lymphedema Negative for: Anemia; Hemophilia; Human Immunodeficiency Virus; Sickle Cell Disease Past Medical History Notes: Hypercholesterolemia Immunological Oncologic Medical History: Negative for: Received Chemotherapy; Received Radiation Immunizations Pneumococcal Vaccine: Received Pneumococcal Vaccination: No Implantable Devices No devices added Hospitalization / Surgery History Type of Hospitalization/Surgery knee right arthoscopy appendectomy cardiac cath WILMER, FLANNIGAN ([id number])  [id number] Page 9 of 9 cervical fusion cholecystectomy wisdom tooth Family and Social History Cancer: Yes - Father,Mother,Siblings; Diabetes: Yes - Siblings,Father; Heart Disease: No; Hereditary Spherocytosis: No; Hypertension: Yes - Father; Kidney Disease: No; Lung Disease: Yes - Father; Seizures: No; Stroke: No; Thyroid Problems: Yes - Siblings; Tuberculosis: No; Never smoker; Marital Status - Married; Alcohol Use: Never; Drug Use: No History; Caffeine Use: Daily - coffee; Financial Concerns: No; Food, Clothing or Shelter Needs: No; Support System Lacking: No; Transportation Concerns: No Electronic Signature(s) Signed: 05/16/2022 12:51:47 PM By: Kalman Shan DO Signed: 05/16/2022 3:53:55 PM By: Erenest Blank Signed: 05/16/2022 4:49:59 PM By: Deon Pilling RN, BSN Entered By: Erenest Blank on 05/16/2022 09:39:33 -------------------------------------------------------------------------------- Imbler Details Patient Name: Date of Service: Catherine Powell 05/16/2022 Medical Record Number: [id number] Patient Account Number: [id number] Date of Birth/Sex: Treating RN: 05-16-1965 (57 y.o. [name] Primary Care Provider: Alen Bleacher Other Clinician: Referring Provider: Treating Provider/Extender: Jannet Askew in Treatment: 0 Diagnosis Coding ICD-10 Codes Code Description [phone] Non-pressure chronic ulcer of other part of right lower leg with fat layer exposed I89.0 Lymphedema, not elsewhere classified Z99.3 Dependence on wheelchair E66.01 Morbid (severe) obesity due to excess calories Facility Procedures : CPT4 Code: [id number] Description: 99214 - WOUND CARE VISIT-LEV 4 EST PT Modifier: Quantity: 1 Physician Procedures : CPT4 Code Description Modifier [id number] - WC PHYS LEVEL 3 - EST PT ICD-10 Diagnosis Description [id number] Non-pressure chronic ulcer of other part of right lower leg with fat layer exposed I89.0  Lymphedema, not elsewhere classified Z99.3 Dependence  on wheelchair E66.01 Morbid (severe) obesity due to excess calories Quantity: 1 Electronic Signature(s) Signed: 05/16/2022 12:51:47 PM By: Kalman Shan DO Entered By: Kalman Shan on 05/16/2022 11:30:43

## 2022-05-20 DIAGNOSIS — Z993 Dependence on wheelchair: Secondary | ICD-10-CM | POA: Diagnosis not present

## 2022-05-20 DIAGNOSIS — R7303 Prediabetes: Secondary | ICD-10-CM | POA: Diagnosis not present

## 2022-05-20 DIAGNOSIS — R29818 Other symptoms and signs involving the nervous system: Secondary | ICD-10-CM | POA: Diagnosis not present

## 2022-05-20 DIAGNOSIS — Z6841 Body Mass Index (BMI) 40.0 and over, adult: Secondary | ICD-10-CM | POA: Diagnosis not present

## 2022-05-20 DIAGNOSIS — L97812 Non-pressure chronic ulcer of other part of right lower leg with fat layer exposed: Secondary | ICD-10-CM | POA: Diagnosis not present

## 2022-05-20 DIAGNOSIS — I890 Lymphedema, not elsewhere classified: Secondary | ICD-10-CM | POA: Diagnosis not present

## 2022-05-21 ENCOUNTER — Other Ambulatory Visit: Payer: Self-pay | Admitting: Student

## 2022-05-21 DIAGNOSIS — G8929 Other chronic pain: Secondary | ICD-10-CM

## 2022-05-21 MED ORDER — HYDROCODONE-ACETAMINOPHEN 5-325 MG PO TABS: 1.0000 | ORAL_TABLET | Freq: Two times a day (BID) | ORAL | 0 refills | Status: DC | PRN

## 2022-05-23 ENCOUNTER — Encounter (HOSPITAL_BASED_OUTPATIENT_CLINIC_OR_DEPARTMENT_OTHER): Payer: Medicaid Other | Admitting: Internal Medicine

## 2022-05-30 ENCOUNTER — Ambulatory Visit (HOSPITAL_BASED_OUTPATIENT_CLINIC_OR_DEPARTMENT_OTHER): Payer: Medicaid Other | Admitting: Internal Medicine

## 2022-06-10 DIAGNOSIS — Z419 Encounter for procedure for purposes other than remedying health state, unspecified: Secondary | ICD-10-CM | POA: Diagnosis not present

## 2022-06-19 ENCOUNTER — Other Ambulatory Visit: Payer: Self-pay | Admitting: Student

## 2022-06-19 DIAGNOSIS — G8929 Other chronic pain: Secondary | ICD-10-CM

## 2022-06-19 DIAGNOSIS — I890 Lymphedema, not elsewhere classified: Secondary | ICD-10-CM

## 2022-06-20 MED ORDER — FUROSEMIDE 40 MG PO TABS: 40.0000 mg | ORAL_TABLET | Freq: Two times a day (BID) | ORAL | 0 refills | Status: DC

## 2022-06-20 MED ORDER — AQUAPHOR EX OINT: TOPICAL_OINTMENT | CUTANEOUS | 0 refills | Status: DC | PRN

## 2022-06-20 MED ORDER — HYDROCODONE-ACETAMINOPHEN 5-325 MG PO TABS
1.0000 | ORAL_TABLET | Freq: Two times a day (BID) | ORAL | 0 refills | Status: DC | PRN
Start: 2022-06-20 — End: 2022-07-22

## 2022-07-01 DIAGNOSIS — Z6841 Body Mass Index (BMI) 40.0 and over, adult: Secondary | ICD-10-CM | POA: Diagnosis not present

## 2022-07-01 DIAGNOSIS — Z713 Dietary counseling and surveillance: Secondary | ICD-10-CM | POA: Diagnosis not present

## 2022-07-10 DIAGNOSIS — Z419 Encounter for procedure for purposes other than remedying health state, unspecified: Secondary | ICD-10-CM | POA: Diagnosis not present

## 2022-07-15 ENCOUNTER — Encounter: Payer: Self-pay | Admitting: Student

## 2022-07-15 ENCOUNTER — Ambulatory Visit: Payer: Medicaid Other | Admitting: Student

## 2022-07-22 ENCOUNTER — Ambulatory Visit (INDEPENDENT_AMBULATORY_CARE_PROVIDER_SITE_OTHER): Payer: Medicaid Other | Admitting: Student

## 2022-07-22 ENCOUNTER — Encounter: Payer: Self-pay | Admitting: Student

## 2022-07-22 DIAGNOSIS — G8929 Other chronic pain: Secondary | ICD-10-CM

## 2022-07-22 DIAGNOSIS — G43109 Migraine with aura, not intractable, without status migrainosus: Secondary | ICD-10-CM

## 2022-07-22 DIAGNOSIS — E559 Vitamin D deficiency, unspecified: Secondary | ICD-10-CM | POA: Diagnosis not present

## 2022-07-22 DIAGNOSIS — I890 Lymphedema, not elsewhere classified: Secondary | ICD-10-CM | POA: Diagnosis not present

## 2022-07-22 DIAGNOSIS — E7800 Pure hypercholesterolemia, unspecified: Secondary | ICD-10-CM

## 2022-07-22 MED ORDER — NYSTATIN 100000 UNIT/GM EX CREA: TOPICAL_CREAM | Freq: Two times a day (BID) | CUTANEOUS | 2 refills | Status: DC

## 2022-07-22 MED ORDER — SUMATRIPTAN SUCCINATE 50 MG PO TABS
ORAL_TABLET | ORAL | 3 refills | Status: DC
Start: 2022-07-22 — End: 2023-01-08

## 2022-07-22 MED ORDER — TOPIRAMATE 100 MG PO TABS
100.0000 mg | ORAL_TABLET | Freq: Two times a day (BID) | ORAL | 3 refills | Status: DC
Start: 2022-07-22 — End: 2023-04-21

## 2022-07-22 MED ORDER — VITAMIN D (ERGOCALCIFEROL) 1.25 MG (50000 UNIT) PO CAPS
ORAL_CAPSULE | ORAL | 3 refills | Status: DC
Start: 2022-07-22 — End: 2023-06-05

## 2022-07-22 MED ORDER — ATORVASTATIN CALCIUM 40 MG PO TABS: 40.0000 mg | ORAL_TABLET | Freq: Every evening | ORAL | 3 refills | Status: DC

## 2022-07-22 MED ORDER — HYDROCODONE-ACETAMINOPHEN 5-325 MG PO TABS
1.0000 | ORAL_TABLET | Freq: Two times a day (BID) | ORAL | 0 refills | Status: DC | PRN
Start: 2022-07-22 — End: 2022-08-21

## 2022-07-22 MED ORDER — MOUNJARO 2.5 MG/0.5ML ~~LOC~~ SOAJ
2.5000 mg | SUBCUTANEOUS | 2 refills | Status: DC
Start: 2022-07-22 — End: 2022-10-16

## 2022-07-22 MED ORDER — TRIAMCINOLONE ACETONIDE 0.1 % EX CREA
1.0000 | TOPICAL_CREAM | Freq: Two times a day (BID) | CUTANEOUS | 0 refills | Status: DC
Start: 2022-07-22 — End: 2022-08-24

## 2022-07-22 MED ORDER — MEDICAL COMPRESSION SOCKS MISC: 0 refills | Status: DC

## 2022-07-22 MED ORDER — PROPRANOLOL HCL 40 MG PO TABS: 80.0000 mg | ORAL_TABLET | Freq: Two times a day (BID) | ORAL | 3 refills | Status: DC

## 2022-07-22 NOTE — Patient Instructions (Signed)
It was wonderful to see you today. Thank you for allowing me to be a part of your care. Below is a short summary of what we discussed at your visit today:  I have refilled your medications today.  You should be able to pick it up from your pharmacy.  Next time we can talk about scheduling for your colonoscopy and mammograms which I deal with.   Please bring all of your medications to every appointment!  If you have any questions or concerns, please do not hesitate to contact us via phone or MyChart message.   Jerre Simon, MD [facility]

## 2022-07-22 NOTE — Progress Notes (Signed)
    SUBJECTIVE:   CHIEF COMPLAINT / HPI:   57 year old female presenting today for medication refills.  Patient endorses compliance and tolerance to her medications.  Has no other concerns.  Morbid obesity  At her appointment 2 months ago placed referral to bariatric surgery.  Patient reports multiple appointments/encounters with bariatric surgery.  Currently following nutrition clinic and started on Phentermine 37mg  daily. Stated transportation is an issue because appointment is scheuled at [employer] and she is not ble to drive at this time.   PERTINENT  PMH / PSH: Reviewed   OBJECTIVE:   BP 134/78   Pulse 77   Ht 5\' 8"  (1.727 m)   LMP 05/15/2012   SpO2 97%   BMI 64.50 kg/m    Physical Exam General: Alert, morbidly obese, NAD Cardiovascular: RRR, No Murmurs, Normal S2/S2 Respiratory: CTAB, No wheezing or Rales Abdomen: No distension or tenderness Extremities: Chronic BLE edema currently wrapped in Band-Aid.  ASSESSMENT/PLAN:   Medications refilled Patient endorses good tolerance and compliance to her medications. -Refilled patient's medications and updated her medication list as well.  Health maintenance Patient is currently due for colonoscopy and mammogram.  Patient declined and deferred for screening test to be ordered at next clinic visit.   Jerre Simon, MD [facility]

## 2022-08-10 DIAGNOSIS — Z419 Encounter for procedure for purposes other than remedying health state, unspecified: Secondary | ICD-10-CM | POA: Diagnosis not present

## 2022-08-21 ENCOUNTER — Other Ambulatory Visit: Payer: Self-pay | Admitting: Student

## 2022-08-21 DIAGNOSIS — G8929 Other chronic pain: Secondary | ICD-10-CM

## 2022-08-22 MED ORDER — HYDROCODONE-ACETAMINOPHEN 5-325 MG PO TABS
1.0000 | ORAL_TABLET | Freq: Two times a day (BID) | ORAL | 0 refills | Status: DC | PRN
Start: 2022-08-22 — End: 2022-09-20

## 2022-08-23 ENCOUNTER — Other Ambulatory Visit: Payer: Self-pay | Admitting: Student

## 2022-08-23 ENCOUNTER — Encounter: Payer: Self-pay | Admitting: Student

## 2022-08-23 DIAGNOSIS — I890 Lymphedema, not elsewhere classified: Secondary | ICD-10-CM

## 2022-08-24 ENCOUNTER — Telehealth: Payer: Self-pay | Admitting: Student

## 2022-08-24 DIAGNOSIS — I890 Lymphedema, not elsewhere classified: Secondary | ICD-10-CM

## 2022-08-24 MED ORDER — TRIAMCINOLONE ACETONIDE 0.1 % EX CREA
1.0000 | TOPICAL_CREAM | Freq: Two times a day (BID) | CUTANEOUS | 0 refills | Status: DC | PRN
Start: 2022-08-24 — End: 2022-08-26

## 2022-08-24 NOTE — Telephone Encounter (Signed)
Received page from after-hours emergency line.  Contacted patient via phone who states she apologize for the late call but she requested a refill on her triamcinolone yesterday and the pharmacy has not received that.  She notes she has lymphedema of the bilateral lower extremities and uses triamcinolone regularly for this.  She notes she has had a wound in the past and does not want the wound opened back up and this cream helps prevent that.  I spoke with attending on-call Dr. Miquel Dunn regarding refill request.  Called back patient. She notes she uses it sparingly strictly when her skin gets flaky and appears as if it is about to open.  She has been dealing with this for years and has seen vein specialist, wound care, many primary care doctors.  I have counseled her on overuse and she appreciates that recommendation.  I have offered her an appointment this week to speak with Dr. Elliot Gurney patient declined.  She was amenable to sending message to Dr. Elliot Gurney regarding refill of larger size of medication.  I have reminded her that our refill request policy requires 48 hours advance notice.  She was appreciative of the call back

## 2022-08-26 ENCOUNTER — Other Ambulatory Visit: Payer: Self-pay | Admitting: Student

## 2022-08-26 DIAGNOSIS — I890 Lymphedema, not elsewhere classified: Secondary | ICD-10-CM

## 2022-08-26 MED ORDER — TRIAMCINOLONE ACETONIDE 0.1 % EX CREA
1.0000 | TOPICAL_CREAM | Freq: Two times a day (BID) | CUTANEOUS | 0 refills | Status: DC | PRN
Start: 2022-08-26 — End: 2022-11-04

## 2022-08-26 NOTE — Progress Notes (Signed)
Patient requesting the biggest jar for steroid cream. Informed her I wont be able to send in the big jar 434g due to concerns of overuse and side effect but will Increase from 30g to 80g. Patient can contact me for refill.

## 2022-09-09 DIAGNOSIS — Z419 Encounter for procedure for purposes other than remedying health state, unspecified: Secondary | ICD-10-CM | POA: Diagnosis not present

## 2022-09-11 ENCOUNTER — Telehealth: Payer: Self-pay

## 2022-09-11 NOTE — Telephone Encounter (Signed)
Chart review completed for patient. Patient is due for screening mammogram. Mychart message sent to patient to inquire about scheduling mammogram.  Julann Mcgilvray, Population Health Specialist.  

## 2022-09-20 ENCOUNTER — Other Ambulatory Visit: Payer: Self-pay | Admitting: Family Medicine

## 2022-09-20 ENCOUNTER — Other Ambulatory Visit: Payer: Self-pay | Admitting: Student

## 2022-09-20 DIAGNOSIS — G8929 Other chronic pain: Secondary | ICD-10-CM

## 2022-09-20 DIAGNOSIS — I890 Lymphedema, not elsewhere classified: Secondary | ICD-10-CM

## 2022-09-20 DIAGNOSIS — B354 Tinea corporis: Secondary | ICD-10-CM

## 2022-09-20 MED ORDER — AQUAPHOR EX OINT: TOPICAL_OINTMENT | CUTANEOUS | 0 refills | Status: AC | PRN

## 2022-09-20 MED ORDER — TERBINAFINE HCL 1 % EX CREA
1.0000 | TOPICAL_CREAM | Freq: Two times a day (BID) | CUTANEOUS | 0 refills | Status: AC
Start: 2022-09-20 — End: ?

## 2022-09-20 MED ORDER — HYDROCODONE-ACETAMINOPHEN 5-325 MG PO TABS
1.0000 | ORAL_TABLET | Freq: Two times a day (BID) | ORAL | 0 refills | Status: DC | PRN
Start: 2022-09-20 — End: 2022-10-22

## 2022-09-20 MED ORDER — FUROSEMIDE 40 MG PO TABS: 40.0000 mg | ORAL_TABLET | Freq: Two times a day (BID) | ORAL | 0 refills | Status: DC

## 2022-09-20 MED ORDER — FLUOCINOLONE ACETONIDE 0.01 % EX SOLN: Freq: Two times a day (BID) | CUTANEOUS | 0 refills | Status: DC

## 2022-10-10 DIAGNOSIS — Z419 Encounter for procedure for purposes other than remedying health state, unspecified: Secondary | ICD-10-CM | POA: Diagnosis not present

## 2022-10-16 ENCOUNTER — Telehealth: Payer: Self-pay

## 2022-10-16 ENCOUNTER — Other Ambulatory Visit: Payer: Self-pay | Admitting: Student

## 2022-10-16 MED ORDER — WEGOVY 0.25 MG/0.5ML ~~LOC~~ SOAJ
0.2500 mg | SUBCUTANEOUS | 2 refills | Status: AC
Start: 2022-10-16 — End: 2023-01-08

## 2022-10-16 NOTE — Telephone Encounter (Signed)
Pharmacy Patient Advocate Encounter   Received notification from CoverMyMeds that prior authorization for [facility] is required/requested.   Insurance verification completed.   The patient is insured through [facility] [place] .   Per test claim:  WEGOVY is preferred by the ins.  If suggested medication is appropriate, Please send in a new RX and discontinue this one. If not, please advise as to why it's not appropriate so that we may request a Prior Authorization.

## 2022-10-16 NOTE — Progress Notes (Signed)
Switching patient's scription from [facility] to go to [facility] for weight loss due to insurance preference.

## 2022-10-17 NOTE — Telephone Encounter (Signed)
Pharmacy Patient Advocate Encounter   Received notification from CoverMyMeds that prior authorization for [facility] is required/requested.  PA required; PA submitted to [facility] [place] Medicaid via CoverMyMeds Key/confirmation #/[id number]. Status is pending

## 2022-10-22 ENCOUNTER — Other Ambulatory Visit: Payer: Self-pay | Admitting: Student

## 2022-10-22 DIAGNOSIS — G8929 Other chronic pain: Secondary | ICD-10-CM

## 2022-10-23 MED ORDER — HYDROCODONE-ACETAMINOPHEN 5-325 MG PO TABS
1.0000 | ORAL_TABLET | Freq: Two times a day (BID) | ORAL | 0 refills | Status: DC | PRN
Start: 2022-10-23 — End: 2022-11-22

## 2022-10-23 NOTE — Telephone Encounter (Signed)
Pharmacy Patient Advocate Encounter  Received notification from [facility] Medicaid that Prior Authorization for [facility] has been DENIED. Please advise how you'd like to proceed. Full denial letter will be uploaded to the media tab. See denial reason below.  REASON:  The following criteria from the health plan guideline, GLP1s for Weight Management, must be met before we can approve this request. Please send [place] supporting chart notes and lab results. - The beneficiary is 59 years of age or older with a BMI greater than or equal to 27 kg/m2 AND has established cardiovascular disease (CVD) defined as having a history of myocardial infarction, stroke, or symptomatic peripheral disease, to be documented on the PA form. AND  - The beneficiary is currently on and will continue lifestyle modification including structured nutrition and physical activity, unless physical activity is not clinically appropriate at the time GLP1 therapy commences. AND - The beneficiary will NOT be using the requested agent in combination with another GLP-1 receptor agonist agent AND - The beneficiary does NOT have any FDA-labeled contraindications to the requested agent, including pregnancy, lactation, history of medullary thyroid cancer or multiple endocrine neoplasia type II.  Patient meets all requirements except the highlighted area above. Let me know if I missed any documentation!

## 2022-10-29 ENCOUNTER — Encounter: Payer: Self-pay | Admitting: Student

## 2022-11-01 ENCOUNTER — Ambulatory Visit: Payer: Medicaid Other | Admitting: Student

## 2022-11-01 ENCOUNTER — Telehealth: Payer: Self-pay | Admitting: Student

## 2022-11-01 NOTE — Telephone Encounter (Signed)
Patient called to cancel appointment she wanted me to send a message back stating why. Her foot is swollen and has a sore, she is unable to put on a shoe. She wanted to send a message back so noone would think that she is just holding appointments and canceling.   Thanks!

## 2022-11-04 ENCOUNTER — Other Ambulatory Visit: Payer: Self-pay | Admitting: Student

## 2022-11-04 DIAGNOSIS — I890 Lymphedema, not elsewhere classified: Secondary | ICD-10-CM

## 2022-11-04 MED ORDER — TRIAMCINOLONE ACETONIDE 0.1 % EX CREA
1.0000 | TOPICAL_CREAM | Freq: Two times a day (BID) | CUTANEOUS | 0 refills | Status: AC | PRN
Start: 2022-11-04 — End: ?

## 2022-11-04 NOTE — Progress Notes (Signed)
Refilled patient's triamcinolone cream for her foot ulcers from lymphedema and retention.

## 2022-11-10 DIAGNOSIS — Z419 Encounter for procedure for purposes other than remedying health state, unspecified: Secondary | ICD-10-CM | POA: Diagnosis not present

## 2022-11-22 ENCOUNTER — Other Ambulatory Visit: Payer: Self-pay | Admitting: Student

## 2022-11-22 ENCOUNTER — Other Ambulatory Visit: Payer: Self-pay

## 2022-11-22 ENCOUNTER — Ambulatory Visit (INDEPENDENT_AMBULATORY_CARE_PROVIDER_SITE_OTHER): Payer: Medicaid Other | Admitting: Student

## 2022-11-22 ENCOUNTER — Encounter: Payer: Self-pay | Admitting: Student

## 2022-11-22 VITALS — BP 166/86 | HR 80 | Ht 68.0 in | Wt >= 6400 oz

## 2022-11-22 DIAGNOSIS — I10 Essential (primary) hypertension: Secondary | ICD-10-CM | POA: Diagnosis not present

## 2022-11-22 DIAGNOSIS — I890 Lymphedema, not elsewhere classified: Secondary | ICD-10-CM

## 2022-11-22 DIAGNOSIS — Z1231 Encounter for screening mammogram for malignant neoplasm of breast: Secondary | ICD-10-CM | POA: Diagnosis not present

## 2022-11-22 DIAGNOSIS — G8929 Other chronic pain: Secondary | ICD-10-CM

## 2022-11-22 MED ORDER — WEGOVY 0.25 MG/0.5ML ~~LOC~~ SOAJ
0.2500 mg | SUBCUTANEOUS | 2 refills | Status: DC
Start: 2022-11-22 — End: 2023-01-15

## 2022-11-22 NOTE — Assessment & Plan Note (Signed)
Patient with significant lymphedema secondary to morbid obesity.  Current BMI is 63.92.  Patient has previously met with bariatric surgery and recommendation is for her to lose weight.  They have connected her with nutritionist who is working with patient's on making adjustments to her nutrition.  Due to decreased ambulation and mostly in a wheelchair physical activity is minimal which does not help her plan of losing weight.  Given patient's history of hypertension and severe lymphedema suspect she will benefit from his semaglutide to help with her weight loss goals. For now Rx Wegovy 0.5 mg weekly -Placed referral for medical weight management -Encouraged to continue to work with nutritionist -Continue dieting

## 2022-11-22 NOTE — Assessment & Plan Note (Signed)
BP today is elevated.  Patient currently asymptomatic and endorses compliance to her medication.  Suspect her blood pressure was slightly elevated because she was asked getting into the clinic today. -Encourage patient to keep a blood pressure diary -If BP is elevated at home will consider adjustments to blood pressure medications -Placed order for BMP and lipid panel

## 2022-11-22 NOTE — Progress Notes (Signed)
    SUBJECTIVE:   CHIEF COMPLAINT / HPI:   Patient is a 57 year old female with past medical history significant for obesity presenting today for lymphedema follow-up.  Patient said levels and wound has improved in the last week.  She currently has nondraining sores and they are currently closed.  She is still doing daily intermittent use of topical steroid and daily pumping on the lower extremities.  Not having any pain, erythema or infectious symptoms.  States she no longer meets with wound care due to poor experience with them at their last visit she felt criticized about her weight.  She is currently working on weight loss mostly through dieting and has started seeing nutritionist with next appointment on October.  Previously met bariatric surgery where she was recommended to lose weight before she can have the procedure done.  PERTINENT  PMH / PSH: Reviewed  OBJECTIVE:   BP (!) 166/86 Comment: unable to take pt bp due to it "hurting"  Pulse 80   Ht 5\' 8"  (1.727 m)   Wt (!) 420 lb 6.4 oz (190.7 kg)   LMP 05/15/2012   SpO2 98%   BMI 63.92 kg/m    Physical Exam General: On wheelchair, morbidly obese NAD Cardiovascular: RRR, No Murmurs, Normal S2/S2 Respiratory: CTAB, No wheezing or Rales Abdomen: No distension or tenderness Extremities: Enlarged LE with area of erythema and hyperpigmentation. No open wounds      ASSESSMENT/PLAN:   Lymphedema Patient with significant lymphedema secondary to morbid obesity.  Current BMI is 63.92.  Patient has previously met with bariatric surgery and recommendation is for her to lose weight.  They have connected her with nutritionist who is working with patient's on making adjustments to her nutrition.  Due to decreased ambulation and mostly in a wheelchair physical activity is minimal which does not help her plan of losing weight.  Given patient's history of hypertension and severe lymphedema suspect she will benefit from his semaglutide to help  with her weight loss goals. For now Rx Wegovy 0.5 mg weekly -Placed referral for medical weight management -Encouraged to continue to work with nutritionist -Continue dieting   Essential hypertension BP today is elevated.  Patient currently asymptomatic and endorses compliance to her medication.  Suspect her blood pressure was slightly elevated because she was asked getting into the clinic today. -Encourage patient to keep a blood pressure diary -If BP is elevated at home will consider adjustments to blood pressure medications -Placed order for BMP and lipid panel    HCM Due for mammogram and colonoscopy.  Order for screening mammogram placed.  Patient reports difficulty with transportation and so we will hold off colonoscopy at this time.  Jerre Simon, MD [facility]

## 2022-11-22 NOTE — Patient Instructions (Signed)
It was wonderful to see you today. Thank you for allowing me to be a part of your care. Below is a short summary of what we discussed at your visit today:  Glad to see that your wounds are improving and you currently have no drainage.  The main continue working with your nutritionist on weight loss.  I have sent in referral to an obesity clinic to help with weight management.  Also sent in prescription for [facility].  Hopefully your insurance will cover for this.  We ordered lipid panel and BMP.  I Ordered your screening mammogram which is due.   Also since andPlease bring all of your medications to every appointment!  If you have any questions or concerns, please do not hesitate to contact us via phone or MyChart message.   Jerre Simon, MD [facility]

## 2022-11-23 ENCOUNTER — Encounter: Payer: Self-pay | Admitting: Student

## 2022-11-23 LAB — LIPID PANEL
Chol/HDL Ratio: 4.7 ratio — ABNORMAL HIGH (ref 0.0–4.4)
Cholesterol, Total: 205 mg/dL — ABNORMAL HIGH (ref 100–199)
HDL: 44 mg/dL (ref >39)
LDL Chol Calc (NIH): 141 mg/dL — ABNORMAL HIGH (ref 0–99)
Triglycerides: 113 mg/dL (ref 0–149)
VLDL Cholesterol Cal: 20 mg/dL (ref 5–40)

## 2022-11-23 LAB — BASIC METABOLIC PANEL
BUN/Creatinine Ratio: 14 (ref 9–23)
BUN: 11 mg/dL (ref 6–24)
CO2: 24 mmol/L (ref 20–29)
Calcium: 9 mg/dL (ref 8.7–10.2)
Chloride: 102 mmol/L (ref 96–106)
Creatinine, Ser: 0.78 mg/dL (ref 0.57–1.00)
Glucose: 102 mg/dL — ABNORMAL HIGH (ref 70–99)
Potassium: 4.1 mmol/L (ref 3.5–5.2)
Sodium: 140 mmol/L (ref 134–144)
eGFR: 88 mL/min/{1.73_m2} (ref >59)

## 2022-11-24 MED ORDER — HYDROCODONE-ACETAMINOPHEN 5-325 MG PO TABS
1.0000 | ORAL_TABLET | Freq: Two times a day (BID) | ORAL | 0 refills | Status: DC | PRN
Start: 2022-11-24 — End: 2022-12-24

## 2022-11-25 ENCOUNTER — Telehealth: Payer: Self-pay

## 2022-11-25 NOTE — Telephone Encounter (Signed)
Patient requesting appeal on Wegovy via mychart.   Called pharmacy. They report PA is needed.   Attempted to submit PA, however, was unable to process request due to previous denial.   Printed off appeal form from [employer].   Labs and last office visit note attached. Provider will need to fill out last page and sign for appeal.   Paperwork placed in provider box.   Veronda Prude, RN

## 2022-11-25 NOTE — Telephone Encounter (Signed)
Provider signed paperwork.   Faxed to [facility].   Veronda Prude, RN

## 2022-11-26 ENCOUNTER — Other Ambulatory Visit: Payer: Self-pay | Admitting: Student

## 2022-11-26 DIAGNOSIS — J4530 Mild persistent asthma, uncomplicated: Secondary | ICD-10-CM

## 2022-12-04 NOTE — Telephone Encounter (Signed)
Rec'd fax from [facility].  Appeal request rec'd by insurance 12/02/22. Appeal should be finished by 12/17/22, unless an extension is needed. Any reports or updates can be sent up until 12/17/22.  Phone [phone] Fax [phone]

## 2022-12-10 DIAGNOSIS — Z419 Encounter for procedure for purposes other than remedying health state, unspecified: Secondary | ICD-10-CM | POA: Diagnosis not present

## 2022-12-24 ENCOUNTER — Other Ambulatory Visit: Payer: Self-pay | Admitting: Student

## 2022-12-24 DIAGNOSIS — G8929 Other chronic pain: Secondary | ICD-10-CM

## 2022-12-24 MED ORDER — HYDROCODONE-ACETAMINOPHEN 5-325 MG PO TABS: 1.0000 | ORAL_TABLET | Freq: Two times a day (BID) | ORAL | 0 refills | Status: DC | PRN

## 2023-01-01 ENCOUNTER — Telehealth: Payer: Self-pay | Admitting: Student

## 2023-01-01 NOTE — Telephone Encounter (Signed)
**  After Hours/ Emergency Line Call**  Received a call to report that Catherine Powell calling for concern of sensation of a blockage in the esophagus. She relates this to her [facility].  In the past she has had difficulty swallowing foods/beverages but this is always dissipated over a short period of time.  However, she was eating boiled chicken yesterday and felt that it got stuck in her throat, no bones in the chicken.  Since that time, she has been unable to hold anything down including beverages/food.  She reports that she has nausea and vomiting after trying to eat or just regurgitates the food or beverage that she tried to swallow.  Endorsing no dyspnea. Recommended that she be seen in the ER for further assessment/imaging given that she has been unable to keep food or drink down over the last 24 hours with difficulty swallowing.  She reports that she has transportation difficulty, will also schedule her with an appointment to access to care tomorrow to be seen in case she is unable to be seen in the ER.  Red flags discussed.  Will forward to PCP & ATC physician.  Alfredo Martinez, MD  PGY-3, [facility] 01/01/2023 10:36 PM

## 2023-01-02 ENCOUNTER — Ambulatory Visit: Payer: Self-pay

## 2023-01-02 ENCOUNTER — Encounter (HOSPITAL_COMMUNITY)
Admission: EM | Disposition: A | Discharge status: Home / Self Care | Payer: Self-pay | Source: Home / Self Care | Attending: Emergency Medicine

## 2023-01-02 ENCOUNTER — Emergency Department (HOSPITAL_COMMUNITY): Payer: Medicaid Other

## 2023-01-02 ENCOUNTER — Emergency Department (EMERGENCY_DEPARTMENT_HOSPITAL): Payer: Medicaid Other | Admitting: Anesthesiology

## 2023-01-02 ENCOUNTER — Ambulatory Visit (HOSPITAL_COMMUNITY)
Admission: EM | Admit: 2023-01-02 | Discharge: 2023-01-02 | Disposition: A | Discharge status: Home / Self Care | Payer: Medicaid Other | Attending: Emergency Medicine | Admitting: Emergency Medicine

## 2023-01-02 ENCOUNTER — Encounter (HOSPITAL_COMMUNITY): Payer: Self-pay | Admitting: Internal Medicine

## 2023-01-02 ENCOUNTER — Other Ambulatory Visit: Payer: Self-pay

## 2023-01-02 ENCOUNTER — Emergency Department (HOSPITAL_COMMUNITY): Payer: Medicaid Other | Admitting: Anesthesiology

## 2023-01-02 DIAGNOSIS — K449 Diaphragmatic hernia without obstruction or gangrene: Secondary | ICD-10-CM | POA: Diagnosis not present

## 2023-01-02 DIAGNOSIS — Z794 Long term (current) use of insulin: Secondary | ICD-10-CM | POA: Insufficient documentation

## 2023-01-02 DIAGNOSIS — K219 Gastro-esophageal reflux disease without esophagitis: Secondary | ICD-10-CM | POA: Insufficient documentation

## 2023-01-02 DIAGNOSIS — J45909 Unspecified asthma, uncomplicated: Secondary | ICD-10-CM | POA: Insufficient documentation

## 2023-01-02 DIAGNOSIS — R1310 Dysphagia, unspecified: Secondary | ICD-10-CM | POA: Diagnosis not present

## 2023-01-02 DIAGNOSIS — W44F3XA Food entering into or through a natural orifice, initial encounter: Secondary | ICD-10-CM | POA: Insufficient documentation

## 2023-01-02 DIAGNOSIS — K209 Esophagitis, unspecified without bleeding: Secondary | ICD-10-CM

## 2023-01-02 DIAGNOSIS — I10 Essential (primary) hypertension: Secondary | ICD-10-CM

## 2023-01-02 DIAGNOSIS — Z79899 Other long term (current) drug therapy: Secondary | ICD-10-CM | POA: Diagnosis not present

## 2023-01-02 DIAGNOSIS — T18128A Food in esophagus causing other injury, initial encounter: Secondary | ICD-10-CM | POA: Insufficient documentation

## 2023-01-02 DIAGNOSIS — T18108A Unspecified foreign body in esophagus causing other injury, initial encounter: Secondary | ICD-10-CM | POA: Diagnosis not present

## 2023-01-02 DIAGNOSIS — Z6841 Body Mass Index (BMI) 40.0 and over, adult: Secondary | ICD-10-CM | POA: Insufficient documentation

## 2023-01-02 DIAGNOSIS — K222 Esophageal obstruction: Secondary | ICD-10-CM

## 2023-01-02 HISTORY — PX: ESOPHAGOGASTRODUODENOSCOPY: SHX5428

## 2023-01-02 LAB — BASIC METABOLIC PANEL
Anion gap: 10 (ref 5–15)
BUN: 14 mg/dL (ref 6–20)
CO2: 26 mmol/L (ref 22–32)
Calcium: 9 mg/dL (ref 8.9–10.3)
Chloride: 104 mmol/L (ref 98–111)
Creatinine, Ser: 0.82 mg/dL (ref 0.44–1.00)
GFR, Estimated: >60 mL/min (ref >60)
Glucose, Bld: 105 mg/dL — ABNORMAL HIGH (ref 70–99)
Potassium: 4.1 mmol/L (ref 3.5–5.1)
Sodium: 140 mmol/L (ref 135–145)

## 2023-01-02 LAB — CBC WITH DIFFERENTIAL/PLATELET
Abs Immature Granulocytes: 0.04 10*3/uL (ref 0.00–0.07)
Basophils Absolute: 0 10*3/uL (ref 0.0–0.1)
Basophils Relative: 0 %
Eosinophils Absolute: 0.2 10*3/uL (ref 0.0–0.5)
Eosinophils Relative: 2 %
HCT: 44 % (ref 36.0–46.0)
Hemoglobin: 14 g/dL (ref 12.0–15.0)
Immature Granulocytes: 0 %
Lymphocytes Relative: 21 %
Lymphs Abs: 2.1 10*3/uL (ref 0.7–4.0)
MCH: 28.2 pg (ref 26.0–34.0)
MCHC: 31.8 g/dL (ref 30.0–36.0)
MCV: 88.7 fL (ref 80.0–100.0)
Monocytes Absolute: 0.5 10*3/uL (ref 0.1–1.0)
Monocytes Relative: 5 %
Neutro Abs: 6.8 10*3/uL (ref 1.7–7.7)
Neutrophils Relative %: 72 %
Platelets: 317 10*3/uL (ref 150–400)
RBC: 4.96 MIL/uL (ref 3.87–5.11)
RDW: 13.5 % (ref 11.5–15.5)
WBC: 9.6 10*3/uL (ref 4.0–10.5)
nRBC: 0 % (ref 0.0–0.2)

## 2023-01-02 SURGERY — CANCELLED PROCEDURE
Anesthesia: General

## 2023-01-02 SURGERY — EGD (ESOPHAGOGASTRODUODENOSCOPY)
Anesthesia: General

## 2023-01-02 MED ORDER — PROPOFOL 10 MG/ML IV BOLUS
INTRAVENOUS | Status: DC | PRN
  Administered 2023-01-02: 200 mg via INTRAVENOUS
  Administered 2023-01-02: 50 mg via INTRAVENOUS

## 2023-01-02 MED ORDER — ONDANSETRON 4 MG PO TBDP
4.0000 mg | ORAL_TABLET | Freq: Once | ORAL | Status: AC
  Administered 2023-01-02: 4 mg via ORAL
  Filled 2023-01-02: qty 1

## 2023-01-02 MED ORDER — GLUCAGON HCL RDNA (DIAGNOSTIC) 1 MG IJ SOLR
1.0000 mg | Freq: Once | INTRAMUSCULAR | Status: AC
  Administered 2023-01-02: 1 mg via INTRAVENOUS
  Filled 2023-01-02: qty 1

## 2023-01-02 MED ORDER — SUCCINYLCHOLINE CHLORIDE 200 MG/10ML IV SOSY
PREFILLED_SYRINGE | INTRAVENOUS | Status: DC | PRN
  Administered 2023-01-02: 140 mg via INTRAVENOUS

## 2023-01-02 MED ORDER — SODIUM CHLORIDE 0.9 % IV SOLN: INTRAVENOUS | Status: DC | PRN

## 2023-01-02 MED ORDER — DEXAMETHASONE SODIUM PHOSPHATE 10 MG/ML IJ SOLN
INTRAMUSCULAR | Status: DC | PRN
  Administered 2023-01-02: 10 mg via INTRAVENOUS

## 2023-01-02 MED ORDER — LIDOCAINE HCL (CARDIAC) PF 100 MG/5ML IV SOSY
PREFILLED_SYRINGE | INTRAVENOUS | Status: DC | PRN
  Administered 2023-01-02: 60 mg via INTRAVENOUS

## 2023-01-02 MED ORDER — ONDANSETRON HCL 4 MG/2ML IJ SOLN
INTRAMUSCULAR | Status: DC | PRN
  Administered 2023-01-02: 4 mg via INTRAVENOUS

## 2023-01-02 NOTE — Anesthesia Postprocedure Evaluation (Signed)
Anesthesia Post Note  Patient: Catherine Powell  Procedure(s) Performed: ESOPHAGOSCOPY ESOPHAGOGASTRODUODENOSCOPY (EGD)     Patient location during evaluation: PACU Anesthesia Type: General Level of consciousness: awake and alert Pain management: pain level controlled Vital Signs Assessment: post-procedure vital signs reviewed and stable Respiratory status: spontaneous breathing, nonlabored ventilation and respiratory function stable Cardiovascular status: blood pressure returned to baseline and stable Postop Assessment: no apparent nausea or vomiting Anesthetic complications: no  No notable events documented.  Last Vitals:  Vitals:   01/02/23 2155 01/02/23 2200  BP: 137/72 137/72  Pulse: 80 78  Resp: 12 16  Temp: 36.7 C   SpO2: 96% 93%    Last Pain:  Vitals:   01/02/23 2155  TempSrc:   PainSc: 0-No pain                 Coryn Mosso,W. EDMOND

## 2023-01-02 NOTE — Op Note (Signed)
[facility] Patient Name: Catherine Powell Procedure Date : 01/02/2023 MRN: [id number] Attending MD: Wilhemina Bonito. Marina Goodell , MD, [id number] Date of Birth: [birth year] CSN: [id number] Age: 57 Admit Type: Outpatient Procedure:                Upper GI endoscopy with removal of acute food                            impaction Indications:              Foreign body in the esophagus Providers:                Wilhemina Bonito. Marina Goodell, MD, Dartha Lodge, RN, Priscella Mann,                            Technician Referring MD:             [facility] emergency room physician Medicines:                Monitored Anesthesia Care Complications:            No immediate complications. Estimated Blood Loss:     Estimated blood loss: none. Procedure:                Pre-Anesthesia Assessment:                           - Prior to the procedure, a History and Physical                            was performed, and patient medications and                            allergies were reviewed. The patient's tolerance of                            previous anesthesia was also reviewed. The risks                            and benefits of the procedure and the sedation                            options and risks were discussed with the patient.                            All questions were answered, and informed consent                            was obtained. Prior Anticoagulants: The patient has                            taken no anticoagulant or antiplatelet agents. ASA                            Grade Assessment: III - A patient with severe  systemic disease. After reviewing the risks and                            benefits, the patient was deemed in satisfactory                            condition to undergo the procedure.                           After obtaining informed consent, the endoscope was                            passed under direct vision. Throughout the                             procedure, the patient's blood pressure, pulse, and                            oxygen saturations were monitored continuously. The                            [id number]-H190 ([id number] Olympus endoscope was introduced                            through the mouth, and advanced to the second part                            of duodenum. The upper GI endoscopy was                            accomplished without difficulty. The patient                            tolerated the procedure well. Scope In: Scope Out: Findings:      The esophagus revealed impacted meat bolus in the distal portion. This       was gently advanced into the stomach with the endoscope. The underlying       mucosa in the distal esophagus was macerated. See images..      The stomach revealed small hiatal hernia.      The examined duodenum was normal.      The cardia and gastric fundus were normal on retroflexion. Impression:               1. Acute food impaction. Status post endoscopic                            removal                           2. Macerated distal esophageal mucosa                           3. GERD. Recommendation:           1. Patient has a contact number available for  emergencies. The signs and symptoms of potential                            delayed complications were discussed with the                            patient. Return to normal activities tomorrow.                            Written discharge instructions were provided to the                            patient.                           2. Patient should be on clear liquids for 24 hours.                            Thereafter soft foods for 1 week. Avoid items such                            as meat, bread, and rice for 1 week.                           3. Continue home medications                           4. Please resume your Nexium 40 mg daily. Stay on                            this indefinitely                            5. Return to the care of your PCP at the family                            practice center. Procedure Code(s):        --- Professional ---                           [id number], Esophagogastroduodenoscopy, flexible,                            transoral; diagnostic, including collection of                            specimen(s) by brushing or washing, when performed                            (separate procedure) Diagnosis Code(s):        --- Professional ---                           T18.108A, Unspecified foreign body in esophagus  causing other injury, initial encounter CPT copyright 2022 [employer]. All rights reserved. The codes documented in this report are preliminary and upon coder review may  be revised to meet current compliance requirements. Wilhemina Bonito. Marina Goodell, MD 01/02/2023 9:48:39 PM This report has been signed electronically. Number of Addenda: 0

## 2023-01-02 NOTE — Anesthesia Preprocedure Evaluation (Addendum)
Anesthesia Evaluation  Patient identified by MRN, date of birth, ID band Patient awake    Reviewed: Allergy & Precautions, H&P , NPO status , Patient's Chart, lab work & pertinent test results, reviewed documented beta blocker date and time   Airway Mallampati: III  TM Distance: >3 FB Neck ROM: Full    Dental no notable dental hx. (+) Teeth Intact, Dental Advisory Given   Pulmonary shortness of breath, asthma    Pulmonary exam normal breath sounds clear to auscultation       Cardiovascular hypertension, Pt. on medications and Pt. on home beta blockers  Rhythm:Regular Rate:Normal     Neuro/Psych  Headaches   Depression       GI/Hepatic Neg liver ROS,GERD  Medicated,,  Endo/Other    Morbid obesity  Renal/GU negative Renal ROS  negative genitourinary   Musculoskeletal  (+) Arthritis , Osteoarthritis,    Abdominal   Peds  Hematology negative hematology ROS (+)   Anesthesia Other Findings   Reproductive/Obstetrics negative OB ROS                             Anesthesia Physical Anesthesia Plan  ASA: 4  Anesthesia Plan: General   Post-op Pain Management: Minimal or no pain anticipated   Induction: Intravenous, Rapid sequence and Cricoid pressure planned  PONV Risk Score and Plan: 3 and Ondansetron and Dexamethasone  Airway Management Planned: Oral ETT  Additional Equipment:   Intra-op Plan:   Post-operative Plan: Extubation in OR  Informed Consent: I have reviewed the patients History and Physical, chart, labs and discussed the procedure including the risks, benefits and alternatives for the proposed anesthesia with the patient or authorized representative who has indicated his/her understanding and acceptance.     Dental advisory given  Plan Discussed with: CRNA  Anesthesia Plan Comments:        Anesthesia Quick Evaluation

## 2023-01-02 NOTE — Anesthesia Procedure Notes (Signed)
Procedure Name: Intubation Date/Time: 01/02/2023 9:25 PM  Performed by: Nicollette Wilhelmi T, CRNAPre-anesthesia Checklist: Patient identified, Emergency Drugs available, Suction available and Patient being monitored Patient Re-evaluated:Patient Re-evaluated prior to induction Oxygen Delivery Method: Circle system utilized Preoxygenation: Pre-oxygenation with 100% oxygen Induction Type: IV induction, Rapid sequence and Cricoid Pressure applied Ventilation: Mask ventilation without difficulty Laryngoscope Size: Glidescope and 3 Grade View: Grade I Tube type: Oral Tube size: 7.0 mm Number of attempts: 1 Airway Equipment and Method: Stylet and Oral airway Placement Confirmation: ETT inserted through vocal cords under direct vision, positive ETCO2 and breath sounds checked- equal and bilateral Secured at: 21 cm Tube secured with: Tape Dental Injury: Teeth and Oropharynx as per pre-operative assessment

## 2023-01-02 NOTE — Transfer of Care (Signed)
Immediate Anesthesia Transfer of Care Note  Patient: Catherine Powell  Procedure(s) Performed: ESOPHAGOSCOPY ESOPHAGOGASTRODUODENOSCOPY (EGD)  Patient Location: PACU  Anesthesia Type:General  Level of Consciousness: awake, alert , and oriented  Airway & Oxygen Therapy: Patient Spontanous Breathing and Patient connected to face mask oxygen  Post-op Assessment: Report given to RN, Post -op Vital signs reviewed and stable, and Patient moving all extremities  Post vital signs: Reviewed and stable  Last Vitals:  Vitals Value Taken Time  BP 147/128 01/02/23 2150  Temp    Pulse 85 01/02/23 2152  Resp 16 01/02/23 2152  SpO2 88 % 01/02/23 2152  Vitals shown include unfiled device data.  Last Pain:  Vitals:   01/02/23 2050  TempSrc: Temporal  PainSc: 0-No pain         Complications: No notable events documented.

## 2023-01-02 NOTE — ED Triage Notes (Signed)
Pt was eating chicken last night and got some caught in her throat. When pt swallows the drink comes back up and can't get past a certain point in her esophagus. Denies trouble breathing. Pt unable to swallow saliva

## 2023-01-02 NOTE — H&P (Addendum)
GI CONSULTATION NOTE:  Referring: [facility] emergency room.  Jamison Oka, PA-C Reason for consultation: Acute food impaction Primary GI: UNASSIGNED    HISTORY OF PRESENT ILLNESS:  Catherine Powell is a 57 y.o. female with past medical history as listed below who consumed chicken and rice 2 days ago and developed food impaction.  It has not passed since.  Presented to the emergency room earlier today with the same.  Could not handle secretions.  Was given glucagon.  Still with same symptom complex.  Now for upper endoscopy to relieve acute food impaction.  She is high risk given her morbid obesity.  She does have a history of chronic GERD.  Was on Nexium.  Stopped the medication a few weeks ago after initiating Wegovy.  (Not sure why).  No prior GI history or GI evaluations.  REVIEW OF SYSTEMS:  All non-GI ROS negative.  Past Medical History:  Diagnosis Date   Allergic rhinitis    Asthma    Carpal tunnel syndrome 05/08/2006   Cellulitis of right lower extremity 01/22/2021   Degenerative joint disease of knees 05/01/2010   Qualifier: Diagnosis of  By: Christella Hartigan MD, Bret     Depression 12/16/2006   Qualifier: Diagnosis of  By: Irving Burton MD, [name]     ESSENTIAL HYPERTENSION 05/22/2006   Qualifier: Diagnosis of  By: Irving Burton MD, Carollee Herter REFLUX, NO ESOPHAGITIS 05/08/2006   Qualifier: Diagnosis of  By: Irving Burton MD, [name]     Heart murmur    slight due to Rheumatic fever had cardiac studies 2009 Dr. Antoine Poche   HYPERCHOLESTEROLEMIA 05/08/2006   Qualifier: Diagnosis of  By: Irving Burton MD, [name]     Lymphedema associated with obesity    Morbid obesity with BMI of 60.0-69.9, adult (HCC)    Rheumatic fever    Right knee meniscal tear    and loose body   SINUSITIS, CHRONIC, NOS 05/08/2006    Past Surgical History:  Procedure Laterality Date   APPENDECTOMY     CARDIAC CATHETERIZATION     2009 Dr. Antoine Poche   CERVICAL FUSION     CHOLECYSTECTOMY     KNEE  ARTHROSCOPY Right 06/17/2016   Procedure: RIGHT KNEE ARTHROSCOPY;  Surgeon: Gean Birchwood, MD;  Location: [facility] OR;  Service: Orthopedics;  Laterality: Right;   WISDOM TOOTH EXTRACTION      Social History AUBRY FARNES  reports that she has never smoked. She has never been exposed to tobacco smoke. She has never used smokeless tobacco. She reports current alcohol use. She reports that she does not use drugs.  family history includes Migraines in her mother.  Allergies  Allergen Reactions   Tolectin Ds [Tolmetin Sodium] Hives and Shortness Of Breath       PHYSICAL EXAMINATION: Vital signs: BP (!) 160/89 (BP Location: Right Wrist)   Pulse 71   Temp 97.6 F (36.4 C) (Oral)   Resp 17   Ht 5\' 8"  (1.727 m)   Wt (!) 199.6 kg   LMP 05/15/2012   SpO2 100%   BMI 66.90 kg/m  General: Well-developed, well-nourished, no acute distress HEENT: Sclerae are anicteric, conjunctiva pink. Oral mucosa intact Lungs: Clear Heart: Regular Abdomen: soft, obese, nontender, nondistended, no obvious ascites, no peritoneal signs, normal bowel sounds. No organomegaly. Extremities: Pedal edema Psychiatric: alert and oriented x3. Cooperative     ASSESSMENT:  1.  Acute food impaction 2.  Morbid obesity 3.  GERD   PLAN:  Upper endoscopy, urgent, to relieve food  impaction.  She is high risk.The nature of the procedure, as well as the risks, benefits, and alternatives were carefully and thoroughly reviewed with the patient. Ample time for discussion and questions allowed. The patient understood, was satisfied, and agreed to proceed. 2.  Modified diet post procedure as will be described. 3.  Resume Nexium 40 mg daily indefinitely  Wilhemina Bonito. Eda Keys., M.D. [facility] Division of Gastroenterology

## 2023-01-02 NOTE — ED Provider Triage Note (Signed)
Emergency Medicine Provider Triage Evaluation Note  Catherine Powell , a 57 y.o. female  was evaluated in triage.  Pt complains of trouble swallowing. Report while eating baked chicken and rice (no bones) 2 nights ago she was having trouble swallowing.  Since then she is unable to tolerates either liquid or solid.  Mild nausea, no diarrhea or constipation, no abd pain, no pain with swallowing.  Was started on Wegovy recently  Review of Systems  Positive: As above Negative: As above  Physical Exam  BP (!) 158/86 (BP Location: Right Arm)   Pulse 79   Temp 98.1 F (36.7 C) (Oral)   Resp 20   Ht 5\' 8"  (1.727 m)   Wt (!) 199.6 kg   LMP 05/15/2012   SpO2 96%   BMI 66.90 kg/m  Gen:   Awake, no distress   Resp:  Normal effort  MSK:   Moves extremities without difficulty  Other:    Medical Decision Making  Medically screening exam initiated at 2:30 PM.  Appropriate orders placed.  Catherine Powell was informed that the remainder of the evaluation will be completed by another provider, this initial triage assessment does not replace that evaluation, and the importance of remaining in the ED until their evaluation is complete.     Catherine Helper, PA-C 01/02/23 1432

## 2023-01-02 NOTE — Progress Notes (Deleted)
    SUBJECTIVE:   CHIEF COMPLAINT / HPI:   *** Vomiting and nausea after starting wegovy     - take 1 week off, then restart at 0.25 - can decrease to 0.25mg  weekly  PERTINENT  PMH / PSH: ***  OBJECTIVE:   LMP 05/15/2012   ***  ASSESSMENT/PLAN:   No problem-specific Assessment & Plan notes found for this encounter.     Lincoln Brigham, MD [facility]

## 2023-01-02 NOTE — ED Provider Notes (Signed)
[place] EMERGENCY DEPARTMENT AT [facility] Provider Note   CSN: [id number] Arrival date & time: 01/02/23  1401     History  Chief Complaint  Patient presents with   food bolus      HPI Catherine Powell is a 57 y.o. obese female with history of hypertension, GERD, lymphedema who presents with 2-day onset of globus sensation after eating chicken and rice with associated nausea, no vomiting, belching.  She notes that today her symptoms have worsened as the sensation has moved to her chest.  She denies any radiating chest pain, or shortness of breath.  She does report periumbilical abdominal pain mild tenderness.  Last bowel movement was this morning without diarrhea or constipation.  Does have a prior history of cholecystectomy and hernia repair.     Home Medications Prior to Admission medications   Medication Sig Start Date End Date Taking? Authorizing Provider  albuterol (VENTOLIN HFA) 108 (90 Base) MCG/ACT inhaler INHALE 2 PUFFS BY MOUTH EVERY 4 HOURS AS NEEDED FOR WHEEZING 11/26/22   Jerre Simon, MD  atorvastatin (LIPITOR) 40 MG tablet Take 1 tablet (40 mg total) by mouth every evening. 07/22/22   Jerre Simon, MD  budesonide-formoterol [facility]) 160-4.5 MCG/ACT inhaler USE 1 INHALATION TWICE A DAY 03/15/22   Jerre Simon, MD  chlorthalidone (HYGROTON) 25 MG tablet Take 1 tablet (25 mg total) by mouth daily. 02/18/22   Jerre Simon, MD  co-enzyme Q-10 50 MG capsule TAKE 1 CAPSULE (30 MG TOTAL) BY MOUTH 2 (TWO) TIMES DAILY. 06/22/18   Lennox Solders, MD  Elastic Bandages & Supports (MEDICAL COMPRESSION SOCKS) MISC Needs 40-62mmhg socks 07/22/22   Jerre Simon, MD  esomeprazole (NEXIUM) 40 MG capsule TAKE 1 CAPSULE DAILY AT 12 NOON 02/18/22   Jerre Simon, MD  fluocinolone (SYNALAR) 0.01 % external solution Apply topically 2 (two) times daily. 09/20/22   Jerre Simon, MD  furosemide (LASIX) 40 MG tablet Take 1 tablet (40 mg total) by mouth 2 (two) times daily.  09/20/22   Jerre Simon, MD  HYDROcodone-acetaminophen (NORCO/VICODIN) 5-325 MG tablet Take 1 tablet by mouth 2 (two) times daily as needed for moderate pain (pain score 4-6). 12/24/22   Jerre Simon, MD  Melatonin 10 MG TABS Take 10 mg by mouth at bedtime. 06/22/18   Lennox Solders, MD  mineral oil-hydrophilic petrolatum (AQUAPHOR) ointment Apply topically as needed for dry skin. 09/20/22   Jerre Simon, MD  montelukast (SINGULAIR) 10 MG tablet TAKE 1 TABLET BY MOUTH EVERYDAY AT BEDTIME 08/07/21   [name], MD  Multiple Vitamins-Minerals (MULTIVITAMIN WOMEN 50+) TABS Take 1 tablet by mouth daily. 07/08/18   Lennox Solders, MD  naloxone [facility]) nasal spray 4 mg/0.1 mL Spray into nose once as needed for overdose 07/24/21   [name], MD  nystatin cream (MYCOSTATIN) Apply topically 2 (two) times daily. 07/22/22   Jerre Simon, MD  Omega-3 Fatty Acids (FISH OIL) 435 MG CAPS Take 1 capsule (435 mg total) by mouth daily. 04/29/17   Almon Hercules, MD  propranolol (INDERAL) 40 MG tablet Take 2 tablets (80 mg total) by mouth 2 (two) times daily. 07/22/22   Jerre Simon, MD  quinapril (ACCUPRIL) 20 MG tablet Take 1 tablet (20 mg total) by mouth at bedtime. 07/23/21   [name], MD  Riboflavin-Magnesium-Feverfew 100-90-25 MG TABS Take 100 mg by mouth daily. 06/22/18   Lennox Solders, MD  Semaglutide-Weight Management (WEGOVY) 0.25 MG/0.5ML SOAJ Inject 0.25 mg into the skin once a  week. 10/16/22 01/08/23  Jerre Simon, MD  Semaglutide-Weight Management [facility]) 0.25 MG/0.5ML SOAJ Inject 0.25 mg into the skin once a week. 11/22/22   Jerre Simon, MD  Skin Protectants, Misc. [facility] Sos) OINT Apply to affected area daily as needed 06/21/20   [name], MD  Soap & Cleansers (AQUA GLYCOLIC SHAMPOO/BODY) LIQD Apply 1 application topically daily. 12/16/16   Tillman Sers, DO  sodium chloride (OCEAN) 0.65 % SOLN nasal spray Place 1 spray into both nostrils as needed for congestion.    [provider]  SUMAtriptan (IMITREX) 50 MG tablet Take one tablet once. May repeat after 2 hours if needed. Maximum 4 tablets in 24 hours. 07/22/22   Jerre Simon, MD  terbinafine (LAMISIL) 1 % cream Apply 1 Application topically 2 (two) times daily. 09/20/22   Jerre Simon, MD  topiramate (TOPAMAX) 100 MG tablet Take 1 tablet (100 mg total) by mouth 2 (two) times daily. 07/22/22   Jerre Simon, MD  triamcinolone cream (KENALOG) 0.1 % Apply 1 Application topically 2 (two) times daily as needed. 11/04/22   Jerre Simon, MD  Vitamin D, Ergocalciferol, (DRISDOL) 1.25 MG (50000 UNIT) CAPS capsule TAKE 1 CAPSULE EVERY 7 DAYS 07/22/22   Jerre Simon, MD      Allergies    Tolectin ds [tolmetin sodium]    Review of Systems   Review of Systems  Respiratory:  Positive for chest tightness. Negative for shortness of breath.   Gastrointestinal:  Positive for abdominal pain.    Physical Exam Updated Vital Signs BP (!) 160/89 (BP Location: Right Wrist)   Pulse 71   Temp 97.6 F (36.4 C) (Oral)   Resp 17   Ht 5\' 8"  (1.727 m)   Wt (!) 199.6 kg   LMP 05/15/2012   SpO2 100%   BMI 66.90 kg/m  Physical Exam Vitals and nursing note reviewed.  Constitutional:      Appearance: She is well-developed.     Comments: Patient visibly uncomfortable, vomiting and tearful  HENT:     Head: Normocephalic and atraumatic.  Eyes:     Conjunctiva/sclera: Conjunctivae normal.  Cardiovascular:     Rate and Rhythm: Normal rate and regular rhythm.     Heart sounds: No murmur heard. Pulmonary:     Effort: Pulmonary effort is normal. No respiratory distress.     Breath sounds: Normal breath sounds.  Abdominal:     Palpations: Abdomen is soft.     Tenderness: There is no abdominal tenderness.     Comments: Mild periumbilical tenderness  Musculoskeletal:        General: No swelling.     Cervical back: Neck supple.  Skin:    General: Skin is warm and dry.     Capillary Refill: Capillary refill takes less  than 2 seconds.  Neurological:     General: No focal deficit present.     Mental Status: She is alert and oriented to person, place, and time.  Psychiatric:        Mood and Affect: Mood normal.     ED Results / Procedures / Treatments   Labs (all labs ordered are listed, but only abnormal results are displayed) Labs Reviewed  BASIC METABOLIC PANEL - Abnormal; Notable for the following components:      Result Value   Glucose, Bld 105 (*)    All other components within normal limits  CBC WITH DIFFERENTIAL/PLATELET    EKG None  Radiology DG Neck Soft Tissue  Result Date: 01/02/2023 CLINICAL  DATA:  Dysphagia EXAM: NECK SOFT TISSUES - 1+ VIEW COMPARISON:  None Available. FINDINGS: Abnormal in tonsillar shadows are within normal limits. The hypopharynx is not distended. Epiglottis and aryepiglottic folds are normal. Retropharyngeal soft tissues are not thickened. Subglottic airway is largely obscured by overlying soft tissue. Cervical fusion with instrumentation of C5 inferiorly is partially visualized on lateral examination. Bridging disc osteophytes at C3-C5 are noted anteriorly demonstrating mild impression upon the posterior hypopharynx. No retained radiopaque foreign body within the visualized cervical soft tissues. IMPRESSION: 1. No radiographic evidence of retained radiopaque foreign body or retropharyngeal soft tissue thickening. 2. Cervical fusion with instrumentation of C5 understanding inferiorly. Bridging disc osteophytes at C3-C5 anteriorly demonstrating mild impression upon the posterior hypopharynx. Electronically Signed   By: Helyn Numbers M.D.   On: 01/02/2023 16:50    Procedures Procedures    Medications Ordered in ED Medications  ondansetron (ZOFRAN-ODT) disintegrating tablet 4 mg (4 mg Oral Given 01/02/23 1434)  ondansetron (ZOFRAN-ODT) disintegrating tablet 4 mg (4 mg Oral Given 01/02/23 1653)  glucagon (human recombinant) (GLUCAGEN) injection 1 mg (1 mg  Intravenous Given 01/02/23 1751)    ED Course/ Medical Decision Making/ A&P Clinical Course as of 01/02/23 2014  Thu Jan 02, 2023  1742 Basic metabolic panel(!) BMP without significant abnormality [JT]  1744 CBC with Differential Reassuring [JT]  1744 DG Neck Soft Tissue Does not demonstrate any retained foreign body [JT]  1747 Examined patient she is currently vomiting globus sensation in her chest.  Encouraged her to drink Coke and have added glucagon with Zofran.  Should this fail we will consult GI for scope [JT]  1748 . [JT]    Clinical Course User Index [JT] Halford Decamp, PA-C                                 Medical Decision Making 57 year old with morbid obesity, GERD, hypertension, lymphedema presenting with 2-day onset of globus sensation in her throat now moved into her chest with associated vomiting.  Differential includes esophageal rupture, Schatzki's ring,swallowed foreign body, small bowel obstruction.  Patient is accompanied by her son.  Prior notes reviewed demonstrating treatment for GERD.  Vitals and blood work reassuring.  Soft tissue neck x-ray demonstrating no retained foreign body.  Abdominal x-ray was considered to evaluate for SBO.  However she had had a bowel movement this morning with normal bowel sounds on exam.  Patient was encouraged to drink a carbonated beverage upon initial evaluation.  With no improvement she was given glucagon and Zofran.  This also failed to improve her symptoms.  Dr. Marina Goodell from GI was consulted with plan to scope patient this evening.  No cardiac monitoring or critical interventions were indicated during this visit.  No social determinants of health.  Upon reevaluation the patient's symptoms continue to persist, nausea has mildly improved with Zofran.  Amount and/or Complexity of Data Reviewed Independent Historian:     Details: Accompanied by son External Data Reviewed: notes.    Details: Prior notes show history of GERD Labs:   Decision-making details documented in ED Course. Radiology: ordered. Decision-making details documented in ED Course.    Details: Soft tissue neck x-ray does not demonstrate any retained foreign body  Risk Prescription drug management. Decision regarding hospitalization.     Document critical care time when appropriate:1}      Final Clinical Impression(s) / ED Diagnoses Final diagnoses:  Food impaction of esophagus,  initial encounter    Rx / DC Orders ED Discharge Orders     None         Halford Decamp, PA-C 01/02/23 2235    Sloan Leiter, DO 01/02/23 2259

## 2023-01-06 ENCOUNTER — Encounter (HOSPITAL_COMMUNITY): Payer: Self-pay | Admitting: Internal Medicine

## 2023-01-07 ENCOUNTER — Encounter (HOSPITAL_COMMUNITY): Payer: Self-pay | Admitting: Internal Medicine

## 2023-01-07 ENCOUNTER — Other Ambulatory Visit: Payer: Self-pay | Admitting: Student

## 2023-01-07 DIAGNOSIS — I10 Essential (primary) hypertension: Secondary | ICD-10-CM

## 2023-01-07 DIAGNOSIS — G43109 Migraine with aura, not intractable, without status migrainosus: Secondary | ICD-10-CM

## 2023-01-07 DIAGNOSIS — E66813 Obesity, class 3: Secondary | ICD-10-CM

## 2023-01-07 DIAGNOSIS — I890 Lymphedema, not elsewhere classified: Secondary | ICD-10-CM

## 2023-01-07 NOTE — Addendum Note (Signed)
Addendum  created 01/07/23 1655 by Fabienne Bruns, RN   Attached Procedures edited

## 2023-01-10 ENCOUNTER — Other Ambulatory Visit: Payer: Self-pay | Admitting: Medical Genetics

## 2023-01-10 ENCOUNTER — Other Ambulatory Visit: Payer: Self-pay

## 2023-01-10 DIAGNOSIS — I890 Lymphedema, not elsewhere classified: Secondary | ICD-10-CM

## 2023-01-10 DIAGNOSIS — Z006 Encounter for examination for normal comparison and control in clinical research program: Secondary | ICD-10-CM

## 2023-01-10 DIAGNOSIS — Z419 Encounter for procedure for purposes other than remedying health state, unspecified: Secondary | ICD-10-CM | POA: Diagnosis not present

## 2023-01-14 ENCOUNTER — Other Ambulatory Visit: Payer: Self-pay

## 2023-01-14 ENCOUNTER — Ambulatory Visit: Payer: Medicaid Other | Admitting: Student

## 2023-01-14 DIAGNOSIS — I890 Lymphedema, not elsewhere classified: Secondary | ICD-10-CM

## 2023-01-15 ENCOUNTER — Ambulatory Visit (INDEPENDENT_AMBULATORY_CARE_PROVIDER_SITE_OTHER): Payer: Medicaid Other | Admitting: Student

## 2023-01-15 ENCOUNTER — Encounter: Payer: Self-pay | Admitting: Student

## 2023-01-15 VITALS — BP 159/78 | HR 79 | Ht 68.0 in | Wt >= 6400 oz

## 2023-01-15 DIAGNOSIS — I10 Essential (primary) hypertension: Secondary | ICD-10-CM

## 2023-01-15 DIAGNOSIS — Z23 Encounter for immunization: Secondary | ICD-10-CM

## 2023-01-15 MED ORDER — WEGOVY 0.5 MG/0.5ML ~~LOC~~ SOAJ: 0.5000 mg | SUBCUTANEOUS | 3 refills | Status: DC

## 2023-01-15 NOTE — Assessment & Plan Note (Signed)
Patient's weight today improved to 408 pounds from 440 pounds about 3 weeks ago.  Recently started on Wegovy other than intermittent nausea patient says she has been doing well on the 0.25 mg Wegovy.  She is agreeable to increasing her Wegovy to 0.5 mg weekly. -Increase Wegovy to 0.5 mg weekly -Encouraged continued dieting

## 2023-01-15 NOTE — Progress Notes (Signed)
    SUBJECTIVE:   CHIEF COMPLAINT / HPI:   Ms. Catherine Powell is a 57 year old female presenting today for follow-up. Recently seen in the ED for Upper GI food impaction requiring EGD EGD showed impacted meat bolus in the distal esophagus with mecerated mucosa Suspected morbid obesity as a contributing factor Since discharge no other episodes Advised to continue Nexium 40mg  daily indefinitely Recently started on Wegovy for weight loss.   PERTINENT  PMH / PSH: Reviewed  OBJECTIVE:   BP (!) 159/78   Pulse 79   Ht 5\' 8"  (1.727 m)   Wt (!) 408 lb 6.4 oz (185.2 kg)   LMP 05/15/2012   SpO2 96%   BMI 62.10 kg/m    Physical Exam General: Alert, well appearing, morbidly obese  Cardiovascular: RRR, No Murmurs, Normal S2/S2 Respiratory: CTAB, No wheezing or Rales Abdomen: No distension or tenderness Extremities: No edema on extremities     ASSESSMENT/PLAN:   Essential hypertension BP today is elevated x 2.  Patient is currently on quinapril 20 mg daily.  She is currently asymptomatic and reports not taking her medication this morning. -Advised patient to continue her medication as prescribed -Encourage daily blood pressure checks and if elevated we will follow-up for medication adjustments.  Obesity, Class III, BMI 40-49.9 (morbid obesity) (HCC) Patient's weight today improved to 408 pounds from 440 pounds about 3 weeks ago.  Recently started on Wegovy other than intermittent nausea patient says she has been doing well on the 0.25 mg Wegovy.  She is agreeable to increasing her Wegovy to 0.5 mg weekly. -Increase Wegovy to 0.5 mg weekly -Encouraged continued dieting   Food impaction EGD in the ED was revealing for food impaction in the distal esophagus.  GI had recommended continuing next Humira 40 mg daily.  No other episodes of food impaction since ED discharge. -Patient will continue Nexium 40 mg daily   Jerre Simon, MD [facility]

## 2023-01-15 NOTE — Assessment & Plan Note (Signed)
BP today is elevated x 2.  Patient is currently on quinapril 20 mg daily.  She is currently asymptomatic and reports not taking her medication this morning. -Advised patient to continue her medication as prescribed -Encourage daily blood pressure checks and if elevated we will follow-up for medication adjustments.

## 2023-01-15 NOTE — Patient Instructions (Addendum)
It was wonderful to see you today. Thank you for allowing me to be a part of your care. Below is a short summary of what we discussed at your visit today:  Your upper endoscopy from the ED showed that you had like meat stuck in your lower esophagus that was sticking out.  The recommendation is that you continue to use the Nexium 40 mg daily indefinitely and also weight loss.  Which you are currently working on.  Great news is that your weight has proved from 440 3 weeks ago to 408 today.  Please continue to work on your diet and and I have also went ahead and increased your Wegovy to 0.50 weekly.  Your blood pressure today is elevated.  Please check your blood pressure daily and if it is still elevated in the next 2 weeks.  Please call to schedule an appointment so that we can increase your blood pressure medication.  Today we gave you your flu vaccine.  If you have any questions or concerns, please do not hesitate to contact us via phone or MyChart message.   Jerre Simon, MD [facility]

## 2023-01-22 ENCOUNTER — Other Ambulatory Visit: Payer: Self-pay | Admitting: Student

## 2023-01-22 ENCOUNTER — Encounter: Payer: Self-pay | Admitting: Student

## 2023-01-22 DIAGNOSIS — G8929 Other chronic pain: Secondary | ICD-10-CM

## 2023-01-22 MED ORDER — HYDROCODONE-ACETAMINOPHEN 5-325 MG PO TABS: 1.0000 | ORAL_TABLET | Freq: Two times a day (BID) | ORAL | 0 refills | Status: DC | PRN

## 2023-02-04 ENCOUNTER — Other Ambulatory Visit (HOSPITAL_COMMUNITY): Payer: Medicaid Other | Attending: Medical Genetics

## 2023-02-09 DIAGNOSIS — Z419 Encounter for procedure for purposes other than remedying health state, unspecified: Secondary | ICD-10-CM | POA: Diagnosis not present

## 2023-02-19 ENCOUNTER — Other Ambulatory Visit: Payer: Self-pay | Admitting: Student

## 2023-02-19 DIAGNOSIS — G8929 Other chronic pain: Secondary | ICD-10-CM

## 2023-02-19 MED ORDER — HYDROCODONE-ACETAMINOPHEN 5-325 MG PO TABS: 1.0000 | ORAL_TABLET | Freq: Two times a day (BID) | ORAL | 0 refills | Status: DC | PRN

## 2023-03-12 DIAGNOSIS — Z419 Encounter for procedure for purposes other than remedying health state, unspecified: Secondary | ICD-10-CM | POA: Diagnosis not present

## 2023-03-21 ENCOUNTER — Other Ambulatory Visit: Payer: Self-pay | Admitting: Student

## 2023-03-21 DIAGNOSIS — I10 Essential (primary) hypertension: Secondary | ICD-10-CM

## 2023-03-21 DIAGNOSIS — G8929 Other chronic pain: Secondary | ICD-10-CM

## 2023-03-21 MED ORDER — HYDROCODONE-ACETAMINOPHEN 5-325 MG PO TABS: 1.0000 | ORAL_TABLET | Freq: Two times a day (BID) | ORAL | 0 refills | Status: DC | PRN

## 2023-03-24 ENCOUNTER — Other Ambulatory Visit (HOSPITAL_COMMUNITY): Payer: Self-pay

## 2023-04-12 DIAGNOSIS — Z419 Encounter for procedure for purposes other than remedying health state, unspecified: Secondary | ICD-10-CM | POA: Diagnosis not present

## 2023-04-20 ENCOUNTER — Other Ambulatory Visit: Payer: Self-pay | Admitting: Student

## 2023-04-20 DIAGNOSIS — G43109 Migraine with aura, not intractable, without status migrainosus: Secondary | ICD-10-CM

## 2023-04-20 DIAGNOSIS — J4530 Mild persistent asthma, uncomplicated: Secondary | ICD-10-CM

## 2023-04-21 ENCOUNTER — Encounter: Payer: Self-pay | Admitting: Student

## 2023-04-25 ENCOUNTER — Other Ambulatory Visit: Payer: Self-pay | Admitting: Student

## 2023-04-25 DIAGNOSIS — G8929 Other chronic pain: Secondary | ICD-10-CM

## 2023-04-25 MED ORDER — HYDROCODONE-ACETAMINOPHEN 5-325 MG PO TABS: 1.0000 | ORAL_TABLET | Freq: Two times a day (BID) | ORAL | 0 refills | Status: DC | PRN

## 2023-05-10 DIAGNOSIS — Z419 Encounter for procedure for purposes other than remedying health state, unspecified: Secondary | ICD-10-CM | POA: Diagnosis not present

## 2023-05-23 ENCOUNTER — Other Ambulatory Visit: Payer: Self-pay | Admitting: Student

## 2023-05-23 DIAGNOSIS — G8929 Other chronic pain: Secondary | ICD-10-CM

## 2023-05-23 MED ORDER — HYDROCODONE-ACETAMINOPHEN 5-325 MG PO TABS: 1.0000 | ORAL_TABLET | Freq: Two times a day (BID) | ORAL | 0 refills | Status: DC | PRN

## 2023-06-05 ENCOUNTER — Other Ambulatory Visit: Payer: Self-pay | Admitting: Student

## 2023-06-05 DIAGNOSIS — E559 Vitamin D deficiency, unspecified: Secondary | ICD-10-CM

## 2023-06-05 DIAGNOSIS — K219 Gastro-esophageal reflux disease without esophagitis: Secondary | ICD-10-CM

## 2023-06-05 DIAGNOSIS — J4530 Mild persistent asthma, uncomplicated: Secondary | ICD-10-CM

## 2023-06-21 DIAGNOSIS — Z419 Encounter for procedure for purposes other than remedying health state, unspecified: Secondary | ICD-10-CM | POA: Diagnosis not present

## 2023-06-24 ENCOUNTER — Other Ambulatory Visit: Payer: Self-pay | Admitting: Student

## 2023-06-24 DIAGNOSIS — G8929 Other chronic pain: Secondary | ICD-10-CM

## 2023-06-25 MED ORDER — HYDROCODONE-ACETAMINOPHEN 5-325 MG PO TABS: 1.0000 | ORAL_TABLET | Freq: Two times a day (BID) | ORAL | 0 refills | Status: DC | PRN

## 2023-07-03 ENCOUNTER — Telehealth: Payer: Self-pay

## 2023-07-03 ENCOUNTER — Encounter: Payer: Self-pay | Admitting: Student

## 2023-07-03 NOTE — Telephone Encounter (Signed)
 Rec'd PA for patients Wegovy  medication.   Appropriate documentation must be submitted with weight loss medication prior authorizations: weight loss attempts and lifestyle changes (including diet and exercise), and how long this was utilized. This will also need to include a statement that the patient is committed to these changes and will continue efforts.  Patient will need an appt with updated chart notes regarding weightloss.

## 2023-07-07 ENCOUNTER — Encounter: Payer: Self-pay | Admitting: Student

## 2023-07-07 ENCOUNTER — Ambulatory Visit (INDEPENDENT_AMBULATORY_CARE_PROVIDER_SITE_OTHER): Admitting: Student

## 2023-07-07 MED ORDER — WEGOVY 0.5 MG/0.5ML ~~LOC~~ SOAJ: 0.5000 mg | SUBCUTANEOUS | 3 refills | Status: DC

## 2023-07-07 MED ORDER — SALINE SPRAY 0.65 % NA SOLN: 1.0000 | NASAL | 2 refills | Status: AC | PRN

## 2023-07-07 MED ORDER — MONTELUKAST SODIUM 10 MG PO TABS: 10.0000 mg | ORAL_TABLET | Freq: Every day | ORAL | 1 refills | Status: AC

## 2023-07-07 NOTE — Progress Notes (Signed)
    SUBJECTIVE:   CHIEF COMPLAINT / HPI:   The patient, with a history of obesity, presents for a follow-up visit. She reports recent personal stressors, including the unexpected death of her sister, which has impacted her mental health and weight management efforts. She also reports issues with sores around her ankles from lyphydema, which have made it difficult to wear shoes and leave the house.  The patient has been on Wegovy  0.25mg  for weight loss, but due to a miscommunication with the pharmacy, the planned increase to 0.5mg  did not occur.   Previously patient has met with bariatric surgery for consult and at the time recommend weight loss goal and referred to nutritionist for assistance with her dieting.  Continue visits with neuro nutritionist and consistently working on diet.  She is eating mostly vegetarian diet while avoiding snacks, soda and fried food.  Weight 02/19/24- 445lb Weight today.  423lb  PERTINENT  PMH / PSH: Reviewed   OBJECTIVE:   BP 126/70   Pulse 72   Ht 5\' 8"  (1.727 m)   Wt (!) 423 lb 6.4 oz (192.1 kg)   LMP 05/15/2012   SpO2 97%   BMI 64.38 kg/m    Physical Exam General: Alert, morbidly obese, wheelchair-bound Cardiovascular: RRR, No Murmurs, Normal S2/S2 Respiratory: CTAB, No wheezing or Rales Abdomen: No distension or tenderness Extremities: BLE edema   ASSESSMENT/PLAN:   Obesity, Class III, BMI 40-49.9 (morbid obesity) (HCC) Patient seen today for weight loss management.  She has continued working on lifestyle changes which include dietary modifications for over a year.  Exercise and increase activities has been recommended and patient continues to try exercising however very difficult given her body habitus and being wheelchair-bound.  Also had a prior consult with bariatric surgery in which weight loss has been recommended and referral to nutritionist placed at the time. Has lost about 25lbs since start of the wegovy . -Advised to continue  working on diet - Encouraged to increase exercise or activity level as tolerated - Continue Ozempic  0.5 mg weekly with plans to increase to 1 mg if tolerated in the next month.    Goble Last, MD [facility]

## 2023-07-07 NOTE — Patient Instructions (Addendum)
 To see you today.  Your weight today is 423 stable from the last few months so far still about 25lb weight lost from start of weight management   I have increased your Wegovy  to 0.5 mg weekly.  Increase it to 1 mg weekly in a month if well-tolerated.  I have refilled your Singulair  and Ocean nasal spray.

## 2023-07-07 NOTE — Assessment & Plan Note (Addendum)
 Patient seen today for weight loss management.  She has continued working on lifestyle changes which include dietary modifications for over a year.  Exercise and increase activities has been recommended and patient continues to try exercising however very difficult given her body habitus and being wheelchair-bound.  Also had a prior consult with bariatric surgery in which weight loss has been recommended and referral to nutritionist placed at the time. Has lost about 25lbs since start of the wegovy . -Advised to continue working on diet - Encouraged to increase exercise or activity level as tolerated - Continue Ozempic  0.5 mg weekly with plans to increase to 1 mg if tolerated in the next month.

## 2023-07-08 NOTE — Telephone Encounter (Addendum)
 Pharmacy Patient Advocate Encounter   Received notification from CoverMyMeds that prior authorization for WEGOVY  0.5MG  is required/requested.   Insurance verification completed.   The patient is insured through [facility] [place] .   PA required; PA started via CoverMyMeds. KEY [id number] . Waiting for clinical questions to populate.

## 2023-07-08 NOTE — Telephone Encounter (Signed)
 Pharmacy Patient Advocate Encounter   PA required; PA submitted to above mentioned insurance via CoverMyMeds Key/confirmation #/[id number]. Status is pending

## 2023-07-09 NOTE — Telephone Encounter (Signed)
 Pharmacy Patient Advocate Encounter  Received notification from [facility] Medicaid that Prior Authorization for WEGOVY  0.25MG  has been APPROVED from 07/08/23 to 01/04/24

## 2023-07-24 ENCOUNTER — Other Ambulatory Visit: Payer: Self-pay | Admitting: Student

## 2023-07-24 ENCOUNTER — Encounter: Payer: Self-pay | Admitting: Student

## 2023-07-24 DIAGNOSIS — G43109 Migraine with aura, not intractable, without status migrainosus: Secondary | ICD-10-CM

## 2023-07-24 DIAGNOSIS — G8929 Other chronic pain: Secondary | ICD-10-CM

## 2023-07-25 MED ORDER — HYDROCODONE-ACETAMINOPHEN 5-325 MG PO TABS: 1.0000 | ORAL_TABLET | Freq: Two times a day (BID) | ORAL | 0 refills | Status: DC | PRN

## 2023-07-31 ENCOUNTER — Other Ambulatory Visit

## 2023-08-07 ENCOUNTER — Other Ambulatory Visit

## 2023-08-11 ENCOUNTER — Encounter: Payer: Self-pay | Admitting: Student

## 2023-08-11 ENCOUNTER — Ambulatory Visit (INDEPENDENT_AMBULATORY_CARE_PROVIDER_SITE_OTHER): Admitting: Student

## 2023-08-11 VITALS — BP 126/58 | HR 89 | Ht 68.0 in | Wt >= 6400 oz

## 2023-08-11 DIAGNOSIS — Z68.41 Body mass index (BMI) pediatric, greater than or equal to 140% of the 95th percentile for age: Secondary | ICD-10-CM | POA: Diagnosis not present

## 2023-08-11 DIAGNOSIS — E66813 Obesity, class 3: Secondary | ICD-10-CM

## 2023-08-11 MED ORDER — SEMAGLUTIDE (1 MG/DOSE) 4 MG/3ML ~~LOC~~ SOPN: 1.0000 mg | PEN_INJECTOR | SUBCUTANEOUS | 2 refills | Status: DC

## 2023-08-11 NOTE — Progress Notes (Signed)
    SUBJECTIVE:   CHIEF COMPLAINT / HPI:   58 year old female with history of obesity and lymphedema Presenting today for obesity follow-up and medication refill Recently started on Wegovy  0.5 mg weekly Endorses good tolerance Expressed desire to increase to switch to Ozempic  1 mg weekly given lower copay with ozempic   PERTINENT  PMH / PSH: Reviewed   OBJECTIVE:   BP (!) 126/58   Pulse 89   Ht 5\' 8"  (1.727 m)   Wt (!) 418 lb (189.6 kg)   LMP 05/15/2012   SpO2 95%   BMI 63.56 kg/m    Physical Exam General: Alert, morbidly obese, on wheel chair Cardiovascular: RRR, No Murmurs, Normal S2/S2 Respiratory: CTAB, No wheezing or Rales Abdomen: No distension or tenderness Extremities: No edema on extremities     ASSESSMENT/PLAN:   Obesity, Class III, BMI 40-49.9 (morbid obesity) (HCC) [place] on Wegovy  0.5 mg weekly with good tolerance.  Expressing desire to increase current dose however patient will like to switch to Ozempic  due to lower co-pay.  Previously tried Ozempic  but client at the time however patient reports changing insurance to Medicare recently.  Weight has decreased from 440 to 418 since October 2024 -Rx Ozempic  1 mg weekly if tolerated will go up to 2 mg weekly in 2-3 weeks. -Discontinued Wegovy    Goble Last, MD [facility]

## 2023-08-11 NOTE — Assessment & Plan Note (Addendum)
 [place] on Wegovy  0.5 mg weekly with good tolerance.  Expressing desire to increase current dose however patient will like to switch to Ozempic  due to lower co-pay.  Previously tried Ozempic  but client at the time however patient reports changing insurance to Medicare recently.  Weight has decreased from 440 to 418 since October 2024 -Rx Ozempic  1 mg weekly if tolerated will go up to 2 mg weekly in 2-3 weeks. -Discontinued Wegovy 

## 2023-08-11 NOTE — Patient Instructions (Signed)
 Pleasure to see you today.  I have twitched you from the Wegovy  to Ozempic .  You will be taking the Ozempic  1 mg weekly and if well-tolerated we can go up to 2 mg in 3 weeks.  Please send me a MyChart message in 3 weeks to increase your dose if well-tolerated.

## 2023-08-13 ENCOUNTER — Encounter: Payer: Self-pay | Admitting: Student

## 2023-08-14 ENCOUNTER — Other Ambulatory Visit: Payer: Self-pay | Admitting: Student

## 2023-08-14 DIAGNOSIS — E66813 Obesity, class 3: Secondary | ICD-10-CM

## 2023-08-14 MED ORDER — WEGOVY 1 MG/0.5ML ~~LOC~~ SOAJ: 1.0000 mg | SUBCUTANEOUS | 1 refills | Status: DC

## 2023-08-14 NOTE — Progress Notes (Signed)
 Switched back to Wegovy  at increased dose of 1mg  weekly as patient insurance doesn't cover Ozempic .

## 2023-08-15 ENCOUNTER — Telehealth: Payer: Self-pay

## 2023-08-15 ENCOUNTER — Other Ambulatory Visit (HOSPITAL_COMMUNITY): Payer: Self-pay

## 2023-08-15 NOTE — Telephone Encounter (Signed)
 Pharmacy Patient Advocate Encounter   Received notification from CoverMyMeds that prior authorization for WEGOVY  1MG  is required/requested.   Insurance verification completed.   The patient is insured through [facility] MEDICAID .   PA required; PA submitted to above mentioned insurance via CoverMyMeds Key/confirmation #/EOC [facility]. Status is pending   5% loss of baseline weight not met (using chart notes from 07/07/23 appt).

## 2023-08-22 ENCOUNTER — Other Ambulatory Visit (HOSPITAL_COMMUNITY): Payer: Self-pay

## 2023-08-22 NOTE — Telephone Encounter (Signed)
 Resubmitted PA using paper form.   Faxed 08/22/23

## 2023-08-25 ENCOUNTER — Other Ambulatory Visit (HOSPITAL_COMMUNITY): Payer: Self-pay

## 2023-08-25 ENCOUNTER — Encounter: Payer: Self-pay | Admitting: Student

## 2023-08-26 ENCOUNTER — Other Ambulatory Visit: Payer: Self-pay | Admitting: Student

## 2023-08-26 DIAGNOSIS — G8929 Other chronic pain: Secondary | ICD-10-CM

## 2023-08-26 MED ORDER — HYDROCODONE-ACETAMINOPHEN 5-325 MG PO TABS: 1.0000 | ORAL_TABLET | Freq: Two times a day (BID) | ORAL | 0 refills | Status: DC | PRN

## 2023-08-26 NOTE — Progress Notes (Signed)
 Refill patients pain med

## 2023-09-01 ENCOUNTER — Other Ambulatory Visit (HOSPITAL_COMMUNITY): Payer: Self-pay

## 2023-09-01 NOTE — Telephone Encounter (Signed)
 Per rep, only request rec'd was the one from 08/15/23. Refaxed paper form again   [phone] to follow up on status

## 2023-09-09 ENCOUNTER — Other Ambulatory Visit (HOSPITAL_COMMUNITY): Payer: Self-pay

## 2023-09-18 ENCOUNTER — Other Ambulatory Visit (HOSPITAL_COMMUNITY): Payer: Self-pay

## 2023-09-18 NOTE — Telephone Encounter (Signed)
 Per test claim PA is approved. Copay is $4 for 28 day supply.

## 2023-09-23 ENCOUNTER — Other Ambulatory Visit: Payer: Self-pay

## 2023-09-23 ENCOUNTER — Encounter: Payer: Self-pay | Admitting: Student

## 2023-09-23 DIAGNOSIS — G8929 Other chronic pain: Secondary | ICD-10-CM

## 2023-09-23 DIAGNOSIS — I10 Essential (primary) hypertension: Secondary | ICD-10-CM

## 2023-09-23 MED ORDER — CHLORTHALIDONE 25 MG PO TABS: 25.0000 mg | ORAL_TABLET | Freq: Every day | ORAL | 1 refills | Status: DC

## 2023-09-23 MED ORDER — HYDROCODONE-ACETAMINOPHEN 5-325 MG PO TABS
1.0000 | ORAL_TABLET | Freq: Two times a day (BID) | ORAL | 0 refills | Status: DC | PRN
Start: 2023-09-23 — End: 2023-10-23

## 2023-10-17 ENCOUNTER — Encounter: Payer: Self-pay | Admitting: Student

## 2023-10-17 ENCOUNTER — Ambulatory Visit (INDEPENDENT_AMBULATORY_CARE_PROVIDER_SITE_OTHER): Admitting: Student

## 2023-10-17 VITALS — BP 126/70 | HR 92

## 2023-10-17 DIAGNOSIS — E7800 Pure hypercholesterolemia, unspecified: Secondary | ICD-10-CM

## 2023-10-17 DIAGNOSIS — E66813 Obesity, class 3: Secondary | ICD-10-CM | POA: Diagnosis present

## 2023-10-17 DIAGNOSIS — I10 Essential (primary) hypertension: Secondary | ICD-10-CM | POA: Diagnosis not present

## 2023-10-17 DIAGNOSIS — I890 Lymphedema, not elsewhere classified: Secondary | ICD-10-CM

## 2023-10-17 MED ORDER — ATORVASTATIN CALCIUM 40 MG PO TABS: 40.0000 mg | ORAL_TABLET | Freq: Every evening | ORAL | 3 refills | Status: DC

## 2023-10-17 MED ORDER — WEGOVY 1 MG/0.5ML ~~LOC~~ SOAJ: 1.0000 mg | SUBCUTANEOUS | 1 refills | Status: AC

## 2023-10-17 MED ORDER — CHLORTHALIDONE 25 MG PO TABS: 25.0000 mg | ORAL_TABLET | Freq: Every day | ORAL | 1 refills | Status: DC

## 2023-10-17 NOTE — Progress Notes (Signed)
    SUBJECTIVE:   CHIEF COMPLAINT / HPI:   Catherine Powell is a 58 year old female who presents with swelling and medication management issues.  She experiences chronic swelling of her feet, which she manages with a pump three times a day. Without the pump, the swelling would be more severe.  She faces challenges with her weight management medications. Wegovy  was initially approved but later denied by insurance due to insufficient weight loss.  She attempts to follow a 1500 calorie diet and avoid sweets, though she finds it challenging, especially when her family does not cook. Her son's improvement in cooking supports her dietary efforts. She needs refills for her medications but did not bring her list.  PERTINENT  PMH / PSH: Reviewed   OBJECTIVE:   BP 126/70   Pulse 92   LMP 05/15/2012   SpO2 95%    Physical Exam General: Alert, morbidly obese, sitting on wheelchair Cardiovascular: RRR, No Murmurs, Normal S2/S2 Respiratory: CTAB, No wheezing or Rales Abdomen: No distension or tenderness Extremities: +3 BLE with compression device   ASSESSMENT/PLAN:   Lymphedema Obesity contributory to her chronic lymphedema.  Patient continues daily lymphedema pumps therapy and still working on diet.  Patient will significantly benefit from weight loss as her current weight is affecting her ADLs and because of limited ambulation -Refilled Wegovy  -Continue dieting -Placed referral to nutritionist to help with diet     Norleen April, MD [facility]

## 2023-10-17 NOTE — Assessment & Plan Note (Signed)
 Obesity contributory to her chronic lymphedema.  Patient continues daily lymphedema pumps therapy and still working on diet.  Patient will significantly benefit from weight loss as her current weight is affecting her ADLs and because of limited ambulation -Refilled Wegovy  -Continue dieting -Placed referral to nutritionist to help with diet

## 2023-10-17 NOTE — Patient Instructions (Signed)
 Pleasure to see you today.  I have sent in refill for your Wegovy  which you will take 1 mg weekly.  Once approved we will go up to 1.5 mg weekly after a month.  Also have sent in referral to a nutritionist to help weight your diet as this would assist with your weight loss and improve your lymphedema overall.  Your blood pressure medication and cholesterol medications has been refilled.

## 2023-10-22 ENCOUNTER — Encounter: Payer: Self-pay | Admitting: Student

## 2023-10-22 DIAGNOSIS — G8929 Other chronic pain: Secondary | ICD-10-CM

## 2023-10-23 ENCOUNTER — Other Ambulatory Visit: Payer: Self-pay | Admitting: Student

## 2023-10-23 DIAGNOSIS — G43109 Migraine with aura, not intractable, without status migrainosus: Secondary | ICD-10-CM

## 2023-10-23 DIAGNOSIS — J4530 Mild persistent asthma, uncomplicated: Secondary | ICD-10-CM

## 2023-10-24 MED ORDER — HYDROCODONE-ACETAMINOPHEN 5-325 MG PO TABS: 1.0000 | ORAL_TABLET | Freq: Two times a day (BID) | ORAL | 0 refills | Status: DC | PRN

## 2023-10-28 ENCOUNTER — Telehealth: Payer: Self-pay

## 2023-10-28 NOTE — Telephone Encounter (Signed)
 Spoke with Dr. Rosendo. Advised that this was completed after last office visit.   Found paperwork in fax file from 10/22/23.   Called patient. She advised that paperwork has not yet been received by [employer].   She asks that we re-fax paperwork. Paperwork re faxed to number on form.   Original placed at front desk for pick up.   Copy made and placed in batch scanning.   Chiquita JAYSON English, RN

## 2023-10-28 NOTE — Telephone Encounter (Signed)
 Patient LVM on nurse line requesting status update on FMLA paperwork.   She had recent office visit on 10/17/2023.  She reports that paperwork is due by 11/01/23.  Will forward to PCP for update.   Chiquita JAYSON English, RN

## 2023-11-21 ENCOUNTER — Encounter: Payer: Self-pay | Admitting: Student

## 2023-11-21 DIAGNOSIS — G8929 Other chronic pain: Secondary | ICD-10-CM

## 2023-11-25 MED ORDER — HYDROCODONE-ACETAMINOPHEN 5-325 MG PO TABS: 1.0000 | ORAL_TABLET | Freq: Two times a day (BID) | ORAL | 0 refills | Status: DC | PRN

## 2023-12-09 ENCOUNTER — Ambulatory Visit: Admitting: Skilled Nursing Facility1

## 2023-12-26 ENCOUNTER — Encounter: Payer: Self-pay | Admitting: Student

## 2023-12-26 DIAGNOSIS — G8929 Other chronic pain: Secondary | ICD-10-CM

## 2023-12-26 MED ORDER — HYDROCODONE-ACETAMINOPHEN 5-325 MG PO TABS: 1.0000 | ORAL_TABLET | Freq: Two times a day (BID) | ORAL | 0 refills | Status: DC | PRN

## 2023-12-29 ENCOUNTER — Other Ambulatory Visit (HOSPITAL_COMMUNITY): Payer: Self-pay

## 2023-12-29 ENCOUNTER — Telehealth: Payer: Self-pay

## 2023-12-29 NOTE — Telephone Encounter (Signed)

## 2024-01-02 ENCOUNTER — Other Ambulatory Visit: Payer: Self-pay | Admitting: Medical Genetics

## 2024-01-02 DIAGNOSIS — Z006 Encounter for examination for normal comparison and control in clinical research program: Secondary | ICD-10-CM

## 2024-01-21 ENCOUNTER — Encounter: Payer: Self-pay | Admitting: Student

## 2024-01-21 DIAGNOSIS — G8929 Other chronic pain: Secondary | ICD-10-CM

## 2024-01-22 MED ORDER — HYDROCODONE-ACETAMINOPHEN 5-325 MG PO TABS: 1.0000 | ORAL_TABLET | Freq: Two times a day (BID) | ORAL | 0 refills | Status: DC | PRN

## 2024-02-23 ENCOUNTER — Other Ambulatory Visit: Payer: Self-pay

## 2024-02-23 ENCOUNTER — Other Ambulatory Visit (HOSPITAL_COMMUNITY): Payer: Self-pay

## 2024-02-23 DIAGNOSIS — J4530 Mild persistent asthma, uncomplicated: Secondary | ICD-10-CM

## 2024-02-23 MED ORDER — ALBUTEROL SULFATE HFA 108 (90 BASE) MCG/ACT IN AERS: 2.0000 | INHALATION_SPRAY | RESPIRATORY_TRACT | 1 refills | Status: AC | PRN

## 2024-02-28 ENCOUNTER — Encounter: Payer: Self-pay | Admitting: Family Medicine

## 2024-02-28 DIAGNOSIS — G8929 Other chronic pain: Secondary | ICD-10-CM

## 2024-02-29 LAB — GENECONNECT MOLECULAR SCREEN: Genetic Analysis Overall Interpretation: NEGATIVE

## 2024-03-01 MED ORDER — HYDROCODONE-ACETAMINOPHEN 5-325 MG PO TABS: 1.0000 | ORAL_TABLET | Freq: Two times a day (BID) | ORAL | 0 refills | Status: DC | PRN

## 2024-03-03 ENCOUNTER — Telehealth: Payer: Self-pay

## 2024-03-03 ENCOUNTER — Other Ambulatory Visit (HOSPITAL_COMMUNITY): Payer: Self-pay

## 2024-03-03 NOTE — Telephone Encounter (Signed)
 Pharmacy Patient Advocate Encounter  Received notification from [place] [employer] that Prior Authorization for HYDROCODONE -ACETAMINOPHEN  5-325MG   has been DENIED.  No reason given; No denial letter received via Fax or CMM. It has been requested and will be uploaded to the media tab once received.   PA #/Case ID/Reference #: [id number] F  Will call after holiday to confirm denial reason. Most likely due to no recent documentation to submit with request.

## 2024-03-03 NOTE — Telephone Encounter (Signed)
 Pharmacy Patient Advocate Encounter   Received notification from Patient Pharmacy that prior authorization for HYDROCODONE -ACETAMINOPHEN  5-325MG  is required/requested.   Insurance verification completed.   The patient is insured through [facility] MEDICAID.   Per test claim: PA required; PA submitted to above mentioned insurance via Latent/[employer] Key/confirmation #/EOC [id number] (?) Status is pending

## 2024-03-08 ENCOUNTER — Other Ambulatory Visit (HOSPITAL_COMMUNITY): Payer: Self-pay

## 2024-03-08 NOTE — Telephone Encounter (Signed)
 Calling [employer] to confirm denial reason. Per rep:   PA submitted as continuation of therapy, however supporting documentation is needed (chart notes, appointment, etc). Once this is complete PA can be resubmitted.   Call reference # [id number]

## 2024-03-09 ENCOUNTER — Other Ambulatory Visit (HOSPITAL_COMMUNITY): Payer: Self-pay

## 2024-04-02 ENCOUNTER — Ambulatory Visit: Payer: Self-pay | Admitting: Family Medicine

## 2024-04-02 ENCOUNTER — Encounter: Payer: Self-pay | Admitting: Family Medicine

## 2024-04-02 VITALS — BP 152/81 | HR 88 | Wt >= 6400 oz

## 2024-04-02 DIAGNOSIS — R635 Abnormal weight gain: Secondary | ICD-10-CM | POA: Diagnosis not present

## 2024-04-02 DIAGNOSIS — I10 Essential (primary) hypertension: Secondary | ICD-10-CM | POA: Diagnosis not present

## 2024-04-02 DIAGNOSIS — J4530 Mild persistent asthma, uncomplicated: Secondary | ICD-10-CM | POA: Diagnosis not present

## 2024-04-02 DIAGNOSIS — G43109 Migraine with aura, not intractable, without status migrainosus: Secondary | ICD-10-CM

## 2024-04-02 DIAGNOSIS — E7800 Pure hypercholesterolemia, unspecified: Secondary | ICD-10-CM | POA: Diagnosis not present

## 2024-04-02 DIAGNOSIS — Z131 Encounter for screening for diabetes mellitus: Secondary | ICD-10-CM | POA: Diagnosis not present

## 2024-04-02 DIAGNOSIS — I890 Lymphedema, not elsewhere classified: Secondary | ICD-10-CM | POA: Diagnosis present

## 2024-04-02 DIAGNOSIS — G8929 Other chronic pain: Secondary | ICD-10-CM | POA: Diagnosis not present

## 2024-04-02 LAB — POCT GLYCOSYLATED HEMOGLOBIN (HGB A1C): Hemoglobin A1C: 5.7 % — AB (ref 4.0–5.6)

## 2024-04-02 MED ORDER — MEDICAL COMPRESSION SOCKS MISC: 0 refills | Status: AC

## 2024-04-02 MED ORDER — BUDESONIDE-FORMOTEROL FUMARATE 160-4.5 MCG/ACT IN AERO: INHALATION_SPRAY | RESPIRATORY_TRACT | 5 refills | Status: AC

## 2024-04-02 MED ORDER — QUINAPRIL HCL 20 MG PO TABS: 20.0000 mg | ORAL_TABLET | Freq: Every day | ORAL | 3 refills | Status: DC

## 2024-04-02 MED ORDER — FUROSEMIDE 40 MG PO TABS: 40.0000 mg | ORAL_TABLET | Freq: Every day | ORAL | 0 refills | Status: AC | PRN

## 2024-04-02 MED ORDER — CHLORTHALIDONE 25 MG PO TABS: 25.0000 mg | ORAL_TABLET | Freq: Every day | ORAL | 1 refills | Status: AC

## 2024-04-02 MED ORDER — SUMATRIPTAN SUCCINATE 50 MG PO TABS: ORAL_TABLET | ORAL | 3 refills | Status: AC

## 2024-04-02 MED ORDER — ATORVASTATIN CALCIUM 40 MG PO TABS: 40.0000 mg | ORAL_TABLET | Freq: Every evening | ORAL | 3 refills | Status: AC

## 2024-04-02 MED ORDER — TOPIRAMATE 100 MG PO TABS: 100.0000 mg | ORAL_TABLET | Freq: Two times a day (BID) | ORAL | 3 refills | Status: AC

## 2024-04-02 MED ORDER — PROPRANOLOL HCL 40 MG PO TABS: 80.0000 mg | ORAL_TABLET | Freq: Two times a day (BID) | ORAL | 3 refills | Status: AC

## 2024-04-02 MED ORDER — HYDROCODONE-ACETAMINOPHEN 5-325 MG PO TABS: 1.0000 | ORAL_TABLET | Freq: Two times a day (BID) | ORAL | 0 refills | Status: AC | PRN

## 2024-04-02 NOTE — Assessment & Plan Note (Signed)
 Will check lipids today, takes atorvastatin  40 mg daily

## 2024-04-02 NOTE — Assessment & Plan Note (Signed)
 Chronic lymphedema with skin ulcers managed at home.  Not interested in lymphedema clinic at this time compression therapy effective but garments need replacement.  - Continue wound care  - Continue daily dressing changes - Continue compression pumps three times daily. -DME order placed for compression braces

## 2024-04-02 NOTE — Progress Notes (Deleted)
" ° ° °  SUBJECTIVE:   Chief compliant/HPI: annual examination  Catherine Powell is a 59 y.o. who presents today for an annual exam.   Juxtalite lower leg 28cm xxlg *** long leg wide calf Nutriition *** History tabs reviewed and updated ***.   Review of systems form reviewed and notable for ***.   OBJECTIVE:   BP (!) 152/81   Pulse 88   Wt (!) 434 lb 6.4 oz (197 kg)   LMP 05/15/2012   SpO2 96%   BMI 66.05 kg/m   ***  ASSESSMENT/PLAN:   Assessment & Plan HYPERCHOLESTEROLEMIA  Asthma, chronic, mild persistent, uncomplicated  Essential hypertension  Lymphedema  Encounter for chronic pain management  Migraine with aura and without status migrainosus, not intractable  Weight gain  Screening for diabetes mellitus (DM)    Annual Examination  See AVS for age appropriate recommendations  PHQ score ***, reviewed and discussed.  BP reviewed and at goal ***.  Asked about intimate partner violence and resources given as appropriate  Advance directives discussion ***  Considered the following items based upon USPSTF recommendations: Diabetes screening: {FMCANNUALORDERED:33692} HIV testing:{FMCANNUALORDERED:33692} Hepatitis C: {FMCANNUALORDERED:33692} Hepatitis B:{FMCANNUALORDERED:33692} Syphilis if at high risk: {FMCANNUALORDERED:33692} GC/CT {GC/CT screening :23818} Lipid panel (nonfasting or fasting) discussed based upon AHA recommendations and {FMCLIPID:33694}.  Consider repeat every 4-6 years.  Reviewed risk factors for latent tuberculosis and {not indicated/requested/declined:14582} Osteoporosis screening considered based upon risk of fracture from [facility] calculator. Major osteoporotic fracture risk is ***%. DEXA {ordered not order:23822}.   Cancer Screening Discussion  Cervical cancer screening: {PAPTYPE:23819} Breast cancer screening: {FMCLIPID:33694} Discussed family history, BRCA testing {not indicated/requested/declined:14582}. Tool used to risk stratify was  ***.  Lung cancer screening:{FMCLUNGCANCERSCREENIN:33695}.  See documentation below regarding indications/risks/benefits.  Colorectal cancer screening: {crcscreen:23821::discussed, colonoscopy ordered}.  Vaccinations ***.   Follow up in 1 *** year or sooner if indicated.  MyChart Activation: {MYCHARTLIST:32522}  Elyce Prescott, DO [facility]    "

## 2024-04-02 NOTE — Patient Instructions (Addendum)
" °  YOUR PLAN: -LYMPHEDEMA WITH CHRONIC SKIN ULCERS: Lymphedema is swelling due to fluid buildup, often in the legs, and can lead to skin ulcers. Continue using Tinaderm and Neosporin for wound care, and replace compression garments as needed. Perform daily dressing changes and use Aquaphor or Vaseline instead of Neosporin. Continue using compression pumps three times daily.  -ABNORMAL WEIGHT GAIN: Weight gain can affect mobility and overall health. We will screen for diabetes with an A1c test, check your thyroid  function, perform a complete blood count, and test your cholesterol levels. You will be referred to a nutritionist for dietary guidance.   -MIGRAINE WITH AURA: Migraines are severe headaches often accompanied by visual disturbances. Continue using Imitrex  for relief and Topamax  for prevention. Both prescriptions have been refilled.  -CHRONIC PAIN, KNEES: Chronic knee pain can limit mobility and quality of life. Continue managing the pain with hydrocodone , which has been refilled.  -ASTHMA: Asthma is a condition that affects your airways and breathing. Continue using your Symbicort  inhaler as prescribed.  -PURE HYPERCHOLESTEROLEMIA: Hypercholesterolemia is high cholesterol, which can increase the risk of heart disease. We will check your cholesterol levels and refill your medication.  "

## 2024-04-02 NOTE — Assessment & Plan Note (Signed)
 Stable with as needed sumatriptan  and daily Topamax , refilled

## 2024-04-02 NOTE — Assessment & Plan Note (Signed)
 Blood pressure elevated today. will reassess at next visit.  Refilled blood pressure medication

## 2024-04-02 NOTE — Progress Notes (Signed)
 "   SUBJECTIVE:   CHIEF COMPLAINT / HPI:   Discussed the use of AI scribe software for clinical note transcription with the patient, who gave verbal consent to proceed.  History of Present Illness Catherine Powell is a 59 year old female who presents for a physical exam and medication refills with her son  Lymphedema and lower extremity edema - Significant swelling in thighs, worsened after discontinuing Wegovy  due to insurance issues - Open sores present on ankle, heel, and thighs, treated with Tegaderm and Neosporin - Uses compression socks and pumps three times daily for swelling management - Swelling has progressed to the point that she is unable to drive due to thigh size preventing access to gas pedals - Uses Lasix  as needed for swelling  Weight gain and obesity management - Weight gain since discontinuing Wegovy  three months ago - Unable to afford Wegovy  currently - Interested in pharmacologist for weight management and dietary changes  Chronic migraines - Migraines occur 12 to 15 days per month, some lasting 2-3 days - Imitrex  provides partial relief - Topamax  used as preventative therapy  Mobility impairment and functional limitations - Difficulty leaving house due to mobility issues and lack of wheelchair ramp - Has contacted [facility] for ramp installation assistance - This is first time out of house since last doctor's visit  Chronic pain and musculoskeletal symptoms - Knee pain managed with hydrocodone , she is tolerating it well and it is helping  Asthma and allergic symptoms - Uses Symbicort  for asthma - Uses Singulair  seasonally for allergies; no refill needed currently  Diabetes risk and family history - Family history of diabetes (sister with diabetes for over 30 years) - No personal diagnosis of diabetes  Needs new Juxtalite lower leg 28cm xxlg long leg wide calf compression stockings  PERTINENT  PMH / PSH: reviewed  OBJECTIVE:   BP  (!) 152/81   Pulse 88   Wt (!) 434 lb 6.4 oz (197 kg)   LMP 05/15/2012   SpO2 96%   BMI 66.05 kg/m   Physical Exam SKIN: Swelling and tenderness present on legs. Wounds present on the back of knees. General: NAD, in wheelchair Cardiovascular: RRR, no m/r/g Respiratory: normal work of breathing on RA, CTAB Extremities: Edema to bilateral lower extremities, compression stockings in place.  Small scattered wounds present to bilateral lower extremities with overlying redness and venous stasis changes to shins.  Redness to folds of posterior knees bilaterally.  Wounds do not appear infected  ASSESSMENT/PLAN:   Assessment & Plan Lymphedema Chronic lymphedema with skin ulcers managed at home.  Not interested in lymphedema clinic at this time compression therapy effective but garments need replacement.  - Continue wound care  - Continue daily dressing changes - Continue compression pumps three times daily. -DME order placed for compression braces HYPERCHOLESTEROLEMIA Will check lipids today, takes atorvastatin  40 mg daily Essential hypertension Blood pressure elevated today. will reassess at next visit.  Refilled blood pressure medication Encounter for chronic pain management Refilled Norco 5-325 mg for chronic knee pain, this is working well for her Migraine with aura and without status migrainosus, not intractable Stable with as needed sumatriptan  and daily Topamax , refilled Weight gain Suspect weight gain due to discontinuation of Wegovy .  However will workup with labs including CBC, BMP, A1c, TSH I have referred to healthy weight and wellness  Healthcare maintenance-patient would like to defer Cologuard and mammogram at this time  Dr. Elyce Prescott, DO [facility]    "

## 2024-04-02 NOTE — Assessment & Plan Note (Signed)
 Refilled Norco 5-325 mg for chronic knee pain, this is working well for her

## 2024-04-03 LAB — LIPID PANEL
Chol/HDL Ratio: 5.2 ratio — ABNORMAL HIGH (ref 0.0–4.4)
Cholesterol, Total: 207 mg/dL — ABNORMAL HIGH (ref 100–199)
HDL: 40 mg/dL
LDL Chol Calc (NIH): 134 mg/dL — ABNORMAL HIGH (ref 0–99)
Triglycerides: 184 mg/dL — ABNORMAL HIGH (ref 0–149)
VLDL Cholesterol Cal: 33 mg/dL (ref 5–40)

## 2024-04-03 LAB — TSH: TSH: 1.65 u[IU]/mL (ref 0.450–4.500)

## 2024-04-03 LAB — CBC
Hematocrit: 44.2 % (ref 34.0–46.6)
Hemoglobin: 14.4 g/dL (ref 11.1–15.9)
MCH: 27.8 pg (ref 26.6–33.0)
MCHC: 32.6 g/dL (ref 31.5–35.7)
MCV: 85 fL (ref 79–97)
Platelets: 321 10*3/uL (ref 150–450)
RBC: 5.18 x10E6/uL (ref 3.77–5.28)
RDW: 13.4 % (ref 11.7–15.4)
WBC: 10.4 10*3/uL (ref 3.4–10.8)

## 2024-04-14 ENCOUNTER — Encounter (INDEPENDENT_AMBULATORY_CARE_PROVIDER_SITE_OTHER): Payer: Self-pay

## 2024-04-14 ENCOUNTER — Other Ambulatory Visit: Payer: Self-pay | Admitting: Family Medicine

## 2024-04-14 MED ORDER — LISINOPRIL 20 MG PO TABS: 20.0000 mg | ORAL_TABLET | Freq: Every day | ORAL | 3 refills | Status: AC

## 2024-04-14 NOTE — Progress Notes (Signed)
 Patient was previously on Quilapril 20 mg but apparently Quinapril  is no longer available on the market for the pharmacy to order and was not covered by insurance. After discussing with Dr. Koval, switched patient to Lisinopril  20 mg.  New prescription sent.   Kathrine Melena, DO 04/14/24 11:09 AM

## 2024-04-15 ENCOUNTER — Other Ambulatory Visit (HOSPITAL_COMMUNITY): Payer: Self-pay

## 2024-05-07 ENCOUNTER — Ambulatory Visit: Payer: Self-pay | Admitting: Family Medicine
